# Patient Record
Sex: Male | Born: 1945
Health system: Southern US, Community
[De-identification: ages and names within clinical notes are randomized; demographics above are authoritative.]

## PROBLEM LIST (undated history)

## (undated) DIAGNOSIS — M545 Low back pain, unspecified: Secondary | ICD-10-CM

## (undated) DIAGNOSIS — E039 Hypothyroidism, unspecified: Secondary | ICD-10-CM

## (undated) DIAGNOSIS — K589 Irritable bowel syndrome without diarrhea: Secondary | ICD-10-CM

## (undated) DIAGNOSIS — K219 Gastro-esophageal reflux disease without esophagitis: Secondary | ICD-10-CM

## (undated) DIAGNOSIS — E119 Type 2 diabetes mellitus without complications: Secondary | ICD-10-CM

## (undated) DIAGNOSIS — R200 Anesthesia of skin: Secondary | ICD-10-CM

## (undated) DIAGNOSIS — M1990 Unspecified osteoarthritis, unspecified site: Secondary | ICD-10-CM

## (undated) DIAGNOSIS — I719 Aortic aneurysm of unspecified site, without rupture: Secondary | ICD-10-CM

## (undated) DIAGNOSIS — K449 Diaphragmatic hernia without obstruction or gangrene: Secondary | ICD-10-CM

## (undated) DIAGNOSIS — F419 Anxiety disorder, unspecified: Secondary | ICD-10-CM

## (undated) DIAGNOSIS — E7800 Pure hypercholesterolemia, unspecified: Secondary | ICD-10-CM

## (undated) DIAGNOSIS — G8929 Other chronic pain: Secondary | ICD-10-CM

## (undated) DIAGNOSIS — M353 Polymyalgia rheumatica: Secondary | ICD-10-CM

## (undated) DIAGNOSIS — I639 Cerebral infarction, unspecified: Secondary | ICD-10-CM

## (undated) DIAGNOSIS — I2510 Atherosclerotic heart disease of native coronary artery without angina pectoris: Secondary | ICD-10-CM

## (undated) DIAGNOSIS — R202 Paresthesia of skin: Secondary | ICD-10-CM

## (undated) HISTORY — DX: Polymyalgia rheumatica: M35.3

## (undated) HISTORY — DX: Hypothyroidism, unspecified: E03.9

## (undated) HISTORY — DX: Atherosclerotic heart disease of native coronary artery without angina pectoris: I25.10

## (undated) HISTORY — PX: INGUINAL HERNIA REPAIR: SUR1180

## (undated) HISTORY — DX: Type 2 diabetes mellitus without complications: E11.9

## (undated) HISTORY — DX: Aortic aneurysm of unspecified site, without rupture: I71.9

---

## 1982-05-30 HISTORY — PX: FINGER SURGERY: SHX640

## 2002-05-30 HISTORY — PX: FOREARM FRACTURE SURGERY: SHX649

## 2007-08-01 ENCOUNTER — Ambulatory Visit (HOSPITAL_COMMUNITY): Admission: RE | Admit: 2007-08-01 | Discharge: 2007-08-01 | Payer: Self-pay | Admitting: Family Medicine

## 2008-02-18 ENCOUNTER — Ambulatory Visit (HOSPITAL_COMMUNITY): Admission: RE | Admit: 2008-02-18 | Discharge: 2008-02-18 | Payer: Self-pay | Admitting: Internal Medicine

## 2009-07-13 ENCOUNTER — Ambulatory Visit: Payer: Self-pay | Admitting: Orthopedic Surgery

## 2009-07-13 DIAGNOSIS — M7580 Other shoulder lesions, unspecified shoulder: Secondary | ICD-10-CM

## 2009-07-22 ENCOUNTER — Telehealth: Payer: Self-pay | Admitting: Orthopedic Surgery

## 2009-07-23 ENCOUNTER — Ambulatory Visit (HOSPITAL_COMMUNITY): Admission: RE | Admit: 2009-07-23 | Discharge: 2009-07-23 | Payer: Self-pay | Admitting: Orthopedic Surgery

## 2009-08-03 ENCOUNTER — Ambulatory Visit: Payer: Self-pay | Admitting: Orthopedic Surgery

## 2009-08-03 DIAGNOSIS — M19019 Primary osteoarthritis, unspecified shoulder: Secondary | ICD-10-CM | POA: Insufficient documentation

## 2010-02-26 ENCOUNTER — Ambulatory Visit (HOSPITAL_COMMUNITY): Admission: RE | Admit: 2010-02-26 | Discharge: 2010-02-26 | Payer: Self-pay | Admitting: General Surgery

## 2010-06-29 NOTE — Progress Notes (Signed)
Summary: MRI appointment.  Phone Note Outgoing Call   Call placed by: Waldon Reining,  July 22, 2009 3:27 PM Call placed to: Patient Action Taken: Phone Call Completed, Appt scheduled Summary of Call: I called togive the patient his MRI appointment at [facility] on 07-23-09 at 2:00. Patient has [employer], authorization [phone] and it expires on 08-13-09. Patient will follow up here for his results.

## 2010-06-29 NOTE — Assessment & Plan Note (Signed)
Summary: LEFT SHOULDER PAIN NEEDS XR/BCBS/BSF   Vital Signs:  Patient profile:   65 year old male Weight:      210 pounds Pulse rate:   68 / minute Resp:     16 per minute  Vitals Entered By: Fuller Canada MD (July 13, 2009 10:34 AM)  Visit Type:  IME Initial Referring Provider:  self Primary Provider:  Dr. Sherwood Gambler  CC:  left shoulder pain.  History of Present Illness: I saw Caleb Butler in the office today for an initial visit.  He is a 65 years old man with the complaint NA:TFTD shoulder pain.  The patient complains of pain over the anterolateral portion of his LEFT shoulder associated with no injury.  It was gradual in onset it is relieved by nothing it is worsened by raising his arm, driving, scooping up a including his shirt on as well as moving his arm behind him.  His pain is severe an 8-9/10.  It is sharp burning pain.  He had an injection a year or so ago in the evening by Dr. Terrilee Croak he has not had any therapy.  He does take ibuprofen and Tylenol without relief.  He complains of some numbness in the LEFT hand.  He had x-rays of his neck x5 views which were essentially normal  Will have xrays today of the left shoulder.  Meds: SImvastatin and Ibuprofen.     Allergies (verified): No Known Drug Allergies  Past History:  Past Medical History: reflux hiatal hernia diverticulitis cholesterol  Past Surgical History: hernia lft side 1992 rt arm fracture 2004  Family History: Family History of Diabetes  Social History: Patient is married.  etired no smoking 5 alcoholic drinks per week a cup of coffee per day  Review of Systems General:  Complains of weight gain; denies weight loss, fever, chills, and fatigue. Cardiac :  Denies chest pain, angina, heart attack, heart failure, poor circulation, blood clots, and phlebitis. Resp:  Complains of short of breath; denies difficulty breathing, COPD, cough, and pneumonia; wheezing. GI:  Denies nausea, vomiting,  diarrhea, constipation, difficulty swallowing, ulcers, GERD, and reflux; heartburn. GU:  Denies kidney failure, kidney transplant, kidney stones, burning, poor stream, testicular cancer, blood in urine, and . Neuro:  Complains of dizziness; denies headache, migraines, numbness, weakness, tremor, and unsteady walking. MS:  Denies joint pain, rheumatoid arthritis, joint swelling, gout, bone cancer, osteoporosis, and . Endo:  Denies thyroid disease, goiter, and diabetes. Psych:  Denies depression, mood swings, anxiety, panic attack, bipolar, and schizophrenia. Derm:  Denies eczema, cancer, and itching; poor healing. EENT:  Denies poor vision, cataracts, glaucoma, poor hearing, vertigo, ears ringing, sinusitis, hoarseness, toothaches, and bleeding gums; floaters in rt eye. Immunology:  Denies seasonal allergies, sinus problems, and allergic to bee stings. Lymphatic:  Denies lymph node cancer and lymph edema.  Physical Exam  Additional Exam:  GEN: well developed, well nourished, normal grooming and hygiene, no deformity and normal body habitus.           vital signs stable as recorded   CDV: pulses are normal, no edema, no erythema. no tenderness  Lymph: normal lymph nodes   Skin: no rashes, skin lesions or open sores   NEURO: normal coordination, reflexes, sensation.   Psyche: awake, alert and oriented. Mood normal   Gait: normal  RIGHT shoulder inspection was normal, range of motion was full, motor exam grade 5, no joint laxity.  Negative impingement sign.  LEFT shoulder anterolateral tenderness along the deltoid with  full passive range of motion decreased active range of motion: Flexion equals 125, external rotation equals 45, internal rotation equals thoracic level XII.  Rotator cuff strength showed mild weakness in the supraspinatus but normal in the internal and external rotators.  Impingement sign was positive.  There is no joint laxity.     Impression &  Recommendations:  Problem # 1:  IMPINGEMENT SYNDROME (ICD-726.2) Assessment New  2 views of the glenohumeral joint   The glenohumeral joint space is normal. The bony anatomy is without bone lesion. The acromion is a Type II   Impression normal shoulder  I offered injection but the patient would rather go ahead with his MRI first and see what we see.  We expect to find a partially torn rotator cuff without retraction.  Probably will need surgery if that is the case.  Orders: New Patient Level III (40347) Shoulder x-ray,  minimum 2 views (42595)  Patient Instructions: 1)  MRI left shoulder 2)  come back for results

## 2010-06-29 NOTE — Letter (Signed)
Summary: History form  History form   Imported By: Jacklynn Ganong 07/20/2009 08:06:56  _____________________________________________________________________  External Attachment:    Type:   Image     Comment:   External Document

## 2010-06-29 NOTE — Assessment & Plan Note (Signed)
Summary: mri results from ap/frs   Vital Signs:  Patient profile:   65 year old male Height:      69 inches Weight:      207 pounds BMI:     30.68 Pulse rate:   68 / minute Resp:     20 per minute CC: Left shoulder rotator cuff Pain Assessment Patient in pain? yes     Location: shoulder Intensity: 3 Onset of pain  constant increase to 8 on movement.  Wake patient up at night   Referring Provider:  self Primary Provider:  Dr. Sherwood Gambler  CC:  Left shoulder rotator cuff.  History of Present Illness: This is a followup visit for Caleb Butler who had an MRI of his LEFT shoulder  His MRI shows tendinosis no tear subacromial bursitis with a.c. joint arthritis  I offered him an injection last time he preferred to get an MRI first.  He is still having tenderness over the a.c. joint and painful adduction.  Recommend injection again  LEFT shoulder a.c. joint injection Verbal consent obtained/The shoulder was injected with depomedrol 40mg /cc and sensorcaine .25% . There were no complications    The patient had immediately relief from the lidocaine  Recommend followup if he continues to have symptoms and discuss again removing the spur    Allergies: No Known Drug Allergies  Review of Systems Musculoskeletal:  Complains of joint pain and stiffness.   Impression & Recommendations:  Problem # 1:  OSTEOARTHRITIS, SHOULDER (ICD-715.91) Assessment Unchanged  left shoulder inj   ac joint   The MRI was done at [facility] and it was reviewed with the report  agree with the reported findings  Orders: Est. Patient Level III (57846) Joint Aspirate / Injection, Large (20610) Depo- Medrol 40mg  (J1030)  Problem # 2:  IMPINGEMENT SYNDROME (ICD-726.2)  Patient Instructions: 1)  You have received an injection of cortisone today. You may experience increased pain at the injection site. Apply ice pack to the area for 20 minutes every 2 hours and take 2 xtra strength tylenol every 8  hours. This increased pain will usually resolve in 24 hours. The injection will take effect in 3-10 days.  2)  If not better after 2 weeks consider surgical excision of spur

## 2010-08-12 LAB — BASIC METABOLIC PANEL
BUN: 16 mg/dL (ref 6–23)
CO2: 29 mEq/L (ref 19–32)
Calcium: 9.4 mg/dL (ref 8.4–10.5)
GFR calc non Af Amer: >60 mL/min (ref >60)
Glucose, Bld: 120 mg/dL — ABNORMAL HIGH (ref 70–99)

## 2010-08-12 LAB — CBC
MCHC: 33.9 g/dL (ref 30.0–36.0)
Platelets: 175 10*3/uL (ref 150–400)
RDW: 13.9 % (ref 11.5–15.5)

## 2010-08-12 LAB — SURGICAL PCR SCREEN: Staphylococcus aureus: NEGATIVE

## 2011-06-15 DIAGNOSIS — J069 Acute upper respiratory infection, unspecified: Secondary | ICD-10-CM | POA: Diagnosis not present

## 2011-06-15 DIAGNOSIS — E669 Obesity, unspecified: Secondary | ICD-10-CM | POA: Diagnosis not present

## 2011-06-15 DIAGNOSIS — Z6830 Body mass index (BMI) 30.0-30.9, adult: Secondary | ICD-10-CM | POA: Diagnosis not present

## 2011-09-06 DIAGNOSIS — E039 Hypothyroidism, unspecified: Secondary | ICD-10-CM | POA: Diagnosis not present

## 2011-09-06 DIAGNOSIS — R5383 Other fatigue: Secondary | ICD-10-CM | POA: Diagnosis not present

## 2011-09-06 DIAGNOSIS — Z6829 Body mass index (BMI) 29.0-29.9, adult: Secondary | ICD-10-CM | POA: Diagnosis not present

## 2011-09-06 DIAGNOSIS — E038 Other specified hypothyroidism: Secondary | ICD-10-CM | POA: Diagnosis not present

## 2011-09-06 DIAGNOSIS — G4700 Insomnia, unspecified: Secondary | ICD-10-CM | POA: Diagnosis not present

## 2011-09-06 DIAGNOSIS — R5381 Other malaise: Secondary | ICD-10-CM | POA: Diagnosis not present

## 2011-09-06 DIAGNOSIS — E785 Hyperlipidemia, unspecified: Secondary | ICD-10-CM | POA: Diagnosis not present

## 2011-09-06 DIAGNOSIS — R7309 Other abnormal glucose: Secondary | ICD-10-CM | POA: Diagnosis not present

## 2011-11-02 DIAGNOSIS — E290 Testicular hyperfunction: Secondary | ICD-10-CM | POA: Diagnosis not present

## 2012-02-09 DIAGNOSIS — E291 Testicular hypofunction: Secondary | ICD-10-CM | POA: Diagnosis not present

## 2012-02-09 DIAGNOSIS — Z23 Encounter for immunization: Secondary | ICD-10-CM | POA: Diagnosis not present

## 2012-02-21 DIAGNOSIS — Z0000 Encounter for general adult medical examination without abnormal findings: Secondary | ICD-10-CM | POA: Diagnosis not present

## 2012-02-27 ENCOUNTER — Other Ambulatory Visit (HOSPITAL_COMMUNITY): Payer: Self-pay | Admitting: Physician Assistant

## 2012-02-27 DIAGNOSIS — H538 Other visual disturbances: Secondary | ICD-10-CM

## 2012-02-27 DIAGNOSIS — Z6830 Body mass index (BMI) 30.0-30.9, adult: Secondary | ICD-10-CM | POA: Diagnosis not present

## 2012-02-27 DIAGNOSIS — R51 Headache: Secondary | ICD-10-CM | POA: Diagnosis not present

## 2012-02-27 DIAGNOSIS — B0229 Other postherpetic nervous system involvement: Secondary | ICD-10-CM | POA: Diagnosis not present

## 2012-02-28 ENCOUNTER — Encounter (HOSPITAL_COMMUNITY): Payer: Self-pay

## 2012-02-28 ENCOUNTER — Ambulatory Visit (HOSPITAL_COMMUNITY)
Admission: RE | Admit: 2012-02-28 | Discharge: 2012-02-28 | Disposition: A | Discharge status: Home / Self Care | Payer: Medicare Other | Source: Ambulatory Visit | Attending: Physician Assistant | Admitting: Physician Assistant

## 2012-02-28 DIAGNOSIS — R51 Headache: Secondary | ICD-10-CM

## 2012-02-28 DIAGNOSIS — H538 Other visual disturbances: Secondary | ICD-10-CM | POA: Insufficient documentation

## 2012-03-16 DIAGNOSIS — E291 Testicular hypofunction: Secondary | ICD-10-CM | POA: Diagnosis not present

## 2012-03-16 DIAGNOSIS — Z6829 Body mass index (BMI) 29.0-29.9, adult: Secondary | ICD-10-CM | POA: Diagnosis not present

## 2012-03-16 DIAGNOSIS — M25549 Pain in joints of unspecified hand: Secondary | ICD-10-CM | POA: Diagnosis not present

## 2012-03-16 DIAGNOSIS — Z125 Encounter for screening for malignant neoplasm of prostate: Secondary | ICD-10-CM | POA: Diagnosis not present

## 2012-03-16 DIAGNOSIS — E119 Type 2 diabetes mellitus without complications: Secondary | ICD-10-CM | POA: Diagnosis not present

## 2012-03-16 DIAGNOSIS — Z79899 Other long term (current) drug therapy: Secondary | ICD-10-CM | POA: Diagnosis not present

## 2012-03-27 DIAGNOSIS — H903 Sensorineural hearing loss, bilateral: Secondary | ICD-10-CM | POA: Diagnosis not present

## 2012-04-11 DIAGNOSIS — M47817 Spondylosis without myelopathy or radiculopathy, lumbosacral region: Secondary | ICD-10-CM | POA: Diagnosis not present

## 2012-04-11 DIAGNOSIS — M5430 Sciatica, unspecified side: Secondary | ICD-10-CM | POA: Diagnosis not present

## 2012-04-11 DIAGNOSIS — M999 Biomechanical lesion, unspecified: Secondary | ICD-10-CM | POA: Diagnosis not present

## 2012-04-13 DIAGNOSIS — M5430 Sciatica, unspecified side: Secondary | ICD-10-CM | POA: Diagnosis not present

## 2012-04-13 DIAGNOSIS — M47817 Spondylosis without myelopathy or radiculopathy, lumbosacral region: Secondary | ICD-10-CM | POA: Diagnosis not present

## 2012-04-13 DIAGNOSIS — M999 Biomechanical lesion, unspecified: Secondary | ICD-10-CM | POA: Diagnosis not present

## 2012-05-15 DIAGNOSIS — E291 Testicular hypofunction: Secondary | ICD-10-CM | POA: Diagnosis not present

## 2012-05-21 DIAGNOSIS — E039 Hypothyroidism, unspecified: Secondary | ICD-10-CM | POA: Diagnosis not present

## 2012-05-21 DIAGNOSIS — Z0289 Encounter for other administrative examinations: Secondary | ICD-10-CM | POA: Diagnosis not present

## 2012-05-21 DIAGNOSIS — E785 Hyperlipidemia, unspecified: Secondary | ICD-10-CM | POA: Diagnosis not present

## 2012-05-21 DIAGNOSIS — N390 Urinary tract infection, site not specified: Secondary | ICD-10-CM | POA: Diagnosis not present

## 2012-08-07 DIAGNOSIS — E291 Testicular hypofunction: Secondary | ICD-10-CM | POA: Diagnosis not present

## 2012-09-07 DIAGNOSIS — Z79899 Other long term (current) drug therapy: Secondary | ICD-10-CM | POA: Diagnosis not present

## 2013-01-01 DIAGNOSIS — E291 Testicular hypofunction: Secondary | ICD-10-CM | POA: Diagnosis not present

## 2013-03-19 DIAGNOSIS — E236 Other disorders of pituitary gland: Secondary | ICD-10-CM | POA: Diagnosis not present

## 2013-03-19 DIAGNOSIS — Z23 Encounter for immunization: Secondary | ICD-10-CM | POA: Diagnosis not present

## 2013-08-26 DIAGNOSIS — E781 Pure hyperglyceridemia: Secondary | ICD-10-CM | POA: Diagnosis not present

## 2013-08-26 DIAGNOSIS — E291 Testicular hypofunction: Secondary | ICD-10-CM | POA: Diagnosis not present

## 2013-08-26 DIAGNOSIS — R079 Chest pain, unspecified: Secondary | ICD-10-CM | POA: Diagnosis not present

## 2013-08-26 DIAGNOSIS — IMO0002 Reserved for concepts with insufficient information to code with codable children: Secondary | ICD-10-CM | POA: Diagnosis not present

## 2013-08-26 DIAGNOSIS — R0609 Other forms of dyspnea: Secondary | ICD-10-CM | POA: Diagnosis not present

## 2013-08-26 DIAGNOSIS — Z6830 Body mass index (BMI) 30.0-30.9, adult: Secondary | ICD-10-CM | POA: Diagnosis not present

## 2013-08-26 DIAGNOSIS — Z79899 Other long term (current) drug therapy: Secondary | ICD-10-CM | POA: Diagnosis not present

## 2013-08-26 DIAGNOSIS — I10 Essential (primary) hypertension: Secondary | ICD-10-CM | POA: Diagnosis not present

## 2013-08-26 DIAGNOSIS — Z125 Encounter for screening for malignant neoplasm of prostate: Secondary | ICD-10-CM | POA: Diagnosis not present

## 2013-09-02 ENCOUNTER — Other Ambulatory Visit (HOSPITAL_COMMUNITY): Payer: Self-pay | Admitting: Internal Medicine

## 2013-09-02 DIAGNOSIS — M545 Low back pain, unspecified: Secondary | ICD-10-CM

## 2013-09-05 ENCOUNTER — Ambulatory Visit (HOSPITAL_COMMUNITY)
Admission: RE | Admit: 2013-09-05 | Discharge: 2013-09-05 | Disposition: A | Discharge status: Home / Self Care | Payer: Medicare Other | Source: Ambulatory Visit | Attending: Internal Medicine | Admitting: Internal Medicine

## 2013-09-05 ENCOUNTER — Encounter (HOSPITAL_COMMUNITY): Payer: Self-pay

## 2013-09-05 DIAGNOSIS — M5126 Other intervertebral disc displacement, lumbar region: Secondary | ICD-10-CM | POA: Diagnosis not present

## 2013-09-05 DIAGNOSIS — M545 Low back pain, unspecified: Secondary | ICD-10-CM | POA: Diagnosis not present

## 2013-09-05 DIAGNOSIS — M51379 Other intervertebral disc degeneration, lumbosacral region without mention of lumbar back pain or lower extremity pain: Secondary | ICD-10-CM | POA: Insufficient documentation

## 2013-09-05 DIAGNOSIS — R209 Unspecified disturbances of skin sensation: Secondary | ICD-10-CM | POA: Insufficient documentation

## 2013-09-05 DIAGNOSIS — M5137 Other intervertebral disc degeneration, lumbosacral region: Secondary | ICD-10-CM | POA: Insufficient documentation

## 2013-09-05 DIAGNOSIS — M47817 Spondylosis without myelopathy or radiculopathy, lumbosacral region: Secondary | ICD-10-CM | POA: Insufficient documentation

## 2013-09-19 DIAGNOSIS — R209 Unspecified disturbances of skin sensation: Secondary | ICD-10-CM | POA: Diagnosis not present

## 2013-09-19 DIAGNOSIS — M549 Dorsalgia, unspecified: Secondary | ICD-10-CM | POA: Diagnosis not present

## 2013-09-19 DIAGNOSIS — Z6829 Body mass index (BMI) 29.0-29.9, adult: Secondary | ICD-10-CM | POA: Diagnosis not present

## 2013-10-03 DIAGNOSIS — R072 Precordial pain: Secondary | ICD-10-CM | POA: Diagnosis not present

## 2013-10-03 DIAGNOSIS — R079 Chest pain, unspecified: Secondary | ICD-10-CM | POA: Diagnosis not present

## 2013-10-03 DIAGNOSIS — R0602 Shortness of breath: Secondary | ICD-10-CM | POA: Diagnosis not present

## 2013-10-17 DIAGNOSIS — I359 Nonrheumatic aortic valve disorder, unspecified: Secondary | ICD-10-CM | POA: Diagnosis not present

## 2013-10-17 DIAGNOSIS — R079 Chest pain, unspecified: Secondary | ICD-10-CM | POA: Diagnosis not present

## 2013-11-16 ENCOUNTER — Encounter (HOSPITAL_COMMUNITY): Payer: Self-pay | Admitting: Emergency Medicine

## 2013-11-16 ENCOUNTER — Emergency Department (HOSPITAL_COMMUNITY)
Admission: EM | Admit: 2013-11-16 | Discharge: 2013-11-16 | Disposition: A | Discharge status: Home / Self Care | Payer: Medicare Other | Attending: Emergency Medicine | Admitting: Emergency Medicine

## 2013-11-16 ENCOUNTER — Emergency Department (HOSPITAL_COMMUNITY): Payer: Medicare Other

## 2013-11-16 DIAGNOSIS — S93409A Sprain of unspecified ligament of unspecified ankle, initial encounter: Secondary | ICD-10-CM | POA: Diagnosis not present

## 2013-11-16 DIAGNOSIS — E119 Type 2 diabetes mellitus without complications: Secondary | ICD-10-CM | POA: Insufficient documentation

## 2013-11-16 DIAGNOSIS — Y9289 Other specified places as the place of occurrence of the external cause: Secondary | ICD-10-CM | POA: Insufficient documentation

## 2013-11-16 DIAGNOSIS — Y9301 Activity, walking, marching and hiking: Secondary | ICD-10-CM | POA: Insufficient documentation

## 2013-11-16 DIAGNOSIS — Z8719 Personal history of other diseases of the digestive system: Secondary | ICD-10-CM | POA: Diagnosis not present

## 2013-11-16 DIAGNOSIS — S93401A Sprain of unspecified ligament of right ankle, initial encounter: Secondary | ICD-10-CM

## 2013-11-16 DIAGNOSIS — X500XXA Overexertion from strenuous movement or load, initial encounter: Secondary | ICD-10-CM | POA: Insufficient documentation

## 2013-11-16 HISTORY — DX: Diaphragmatic hernia without obstruction or gangrene: K44.9

## 2013-11-16 NOTE — ED Provider Notes (Signed)
CSN: [id number]     Arrival date & time 11/16/13  0750 History   None    Chief Complaint  Patient presents with  . Ankle Pain   The history is provided by the patient. No language interpreter was used.   This chart was scribed for Caleb Christen, MD by Thea Alken, ED Scribe. This patient was seen in room APA03/APA03 and the patient's care was started at 8:36 AM.  HPI Comments:  Caleb Butler is a 68 y.o. male who present to the Emergency Department complaining of right ankle pain x last night. States he heard a "pop" to his right ankle when he stepped in [place]. Pt has tried icing ankle without relief to pain. He also took 3 ibuprofen last night for the pain. Pt reports similar injury in the past to left ankle.  Past Medical History  Diagnosis Date  . Diabetes mellitus   . Thyroid disease   . Hiatal hernia    Past Surgical History  Procedure Laterality Date  . Right arm      History reviewed. No pertinent family history. History  Substance Use Topics  . Smoking status: Never Smoker   . Smokeless tobacco: Not on file  . Alcohol Use: Yes     Comment: occ    Review of Systems  Musculoskeletal: Positive for arthralgias and myalgias.   Allergies  Bee venom and Shellfish allergy  Home Medications   Prior to Admission medications   Not on File   Triage Vitals- BP 107/73  Pulse 72  Temp(Src) 97.9 F (36.6 C) (Oral)  Resp 19  SpO2 98% Physical Exam  Nursing note and vitals reviewed. Constitutional: He is oriented to person, place, and time. He appears well-developed and well-nourished.  HENT:  Head: Normocephalic and atraumatic.  Eyes: Conjunctivae and EOM are normal. Pupils are equal, round, and reactive to light.  Neck: Normal range of motion. Neck supple.  Cardiovascular: Normal rate, regular rhythm and normal heart sounds.   Pulmonary/Chest: Effort normal and breath sounds normal.  Abdominal: Soft. Bowel sounds are  normal.  Musculoskeletal: Normal range of motion.  Tender and puffy to right lateral ankle Pain with ROM  Neurological: He is alert and oriented to person, place, and time.  Skin: Skin is warm and dry.  Psychiatric: He has a normal mood and affect. His behavior is normal.    ED Course  Procedures COORDINATION OF CARE: 8:43 AM-Discussed treatment plan which includes ankle brace with pt at bedside and pt agreed to plan. Pt has also been advised to rest, elevate, and ice ankle as well as take ibuprofen as need for the the pain   Labs Review Labs Reviewed - No data to display  Imaging Review Dg Ankle Complete Right  11/16/2013   CLINICAL DATA:  Stepped in hole, twisted ankle  EXAM: RIGHT ANKLE - COMPLETE 3+ VIEW  COMPARISON:  None  FINDINGS: Extensive soft tissue swelling about the lateral aspect of the ankle. No acute fracture or malalignment identified. There is a moderate ankle joint effusion. Bony mineralization is within normal limits. No lytic or blastic osseous lesion. The ankle mortise is congruent in the tailor dome intact.  IMPRESSION: Soft tissue swelling and ankle joint effusion without evidence of acute fracture or malalignment.   Electronically Signed   By: Jacqulynn Cadet M.D.   On: 11/16/2013 08:34     EKG Interpretation None      MDM  Final diagnoses:  Right ankle sprain, initial encounter   X-ray of right ankle reveals no fracture.   Rest, ice, elevate, compress.   No other injuries. I personally performed the services described in this documentation, which was scribed in my presence. The recorded information has been reviewed and is accurate.      Caleb Christen, MD 11/16/13 [phone]

## 2013-11-16 NOTE — Discharge Instructions (Signed)
Ankle Sprain An ankle sprain is an injury to the strong, fibrous tissues (ligaments) that hold your ankle bones together.  HOME CARE   Put ice on your ankle for 1-2 days or as told by your doctor.  Put ice in a plastic bag.  Place a towel between your skin and the bag.  Leave the ice on for 15-20 minutes at a time, every 2 hours while you are awake.  Only take medicine as told by your doctor.  Raise (elevate) your injured ankle above the level of your heart as much as possible for 2-3 days.  Use crutches if your doctor tells you to. Slowly put your own weight on the affected ankle. Use the crutches until you can walk without pain.  If you have a plaster splint:  Do not rest it on anything harder than a pillow for 24 hours.  Do not put weight on it.  Do not get it wet.  Take it off to shower or bathe.  If given, use an elastic wrap or support stocking for support. Take the wrap off if your toes lose feeling (numb), tingle, or turn cold or blue.  If you have an air splint:  Add or let out air to make it comfortable.  Take it off at night and to shower and bathe.  Wiggle your toes and move your ankle up and down often while you are wearing it. GET HELP RIGHT AWAY IF:   Your toes lose feeling (numb) or turn blue.  You have severe pain that is increasing.  You have rapidly increasing bruising or puffiness (swelling).  Your toes feel very cold.  You lose feeling in your foot.  Your medicine does not help your pain. MAKE SURE YOU:   Understand these instructions.  Will watch your condition.  Will get help right away if you are not doing well or get worse. Document Released: 11/02/2007 Document Revised: 02/08/2012 Document Reviewed: 11/28/2011 [facility] Patient Information 2015 [place], [place]. This information is not intended to replace advice given to you by your health care provider. Make sure you discuss any questions you have with your health care  provider.  X-ray shows no fracture. Rest, ice, elevate, compression brace.   Ibuprofen 3-4 tablets 3 times a day.

## 2013-11-16 NOTE — ED Notes (Signed)
Pt stepped in hole last night and c/o pain to right ankle. Swelling around right ankle observed. Pedal pulses present.

## 2013-11-18 DIAGNOSIS — S9000XA Contusion of unspecified ankle, initial encounter: Secondary | ICD-10-CM | POA: Diagnosis not present

## 2013-11-18 DIAGNOSIS — M25579 Pain in unspecified ankle and joints of unspecified foot: Secondary | ICD-10-CM | POA: Diagnosis not present

## 2013-11-18 DIAGNOSIS — S93409A Sprain of unspecified ligament of unspecified ankle, initial encounter: Secondary | ICD-10-CM | POA: Diagnosis not present

## 2013-12-09 DIAGNOSIS — Z5189 Encounter for other specified aftercare: Secondary | ICD-10-CM | POA: Diagnosis not present

## 2013-12-11 DIAGNOSIS — M79609 Pain in unspecified limb: Secondary | ICD-10-CM | POA: Diagnosis not present

## 2013-12-11 DIAGNOSIS — S93409A Sprain of unspecified ligament of unspecified ankle, initial encounter: Secondary | ICD-10-CM | POA: Diagnosis not present

## 2013-12-11 DIAGNOSIS — R262 Difficulty in walking, not elsewhere classified: Secondary | ICD-10-CM | POA: Diagnosis not present

## 2013-12-12 DIAGNOSIS — R262 Difficulty in walking, not elsewhere classified: Secondary | ICD-10-CM | POA: Diagnosis not present

## 2013-12-12 DIAGNOSIS — M79609 Pain in unspecified limb: Secondary | ICD-10-CM | POA: Diagnosis not present

## 2013-12-12 DIAGNOSIS — S93409A Sprain of unspecified ligament of unspecified ankle, initial encounter: Secondary | ICD-10-CM | POA: Diagnosis not present

## 2013-12-16 DIAGNOSIS — R262 Difficulty in walking, not elsewhere classified: Secondary | ICD-10-CM | POA: Diagnosis not present

## 2013-12-16 DIAGNOSIS — M79609 Pain in unspecified limb: Secondary | ICD-10-CM | POA: Diagnosis not present

## 2013-12-16 DIAGNOSIS — S93409A Sprain of unspecified ligament of unspecified ankle, initial encounter: Secondary | ICD-10-CM | POA: Diagnosis not present

## 2013-12-18 DIAGNOSIS — R262 Difficulty in walking, not elsewhere classified: Secondary | ICD-10-CM | POA: Diagnosis not present

## 2013-12-18 DIAGNOSIS — S93409A Sprain of unspecified ligament of unspecified ankle, initial encounter: Secondary | ICD-10-CM | POA: Diagnosis not present

## 2013-12-18 DIAGNOSIS — M79609 Pain in unspecified limb: Secondary | ICD-10-CM | POA: Diagnosis not present

## 2013-12-20 DIAGNOSIS — M79609 Pain in unspecified limb: Secondary | ICD-10-CM | POA: Diagnosis not present

## 2013-12-20 DIAGNOSIS — S93409A Sprain of unspecified ligament of unspecified ankle, initial encounter: Secondary | ICD-10-CM | POA: Diagnosis not present

## 2013-12-20 DIAGNOSIS — R262 Difficulty in walking, not elsewhere classified: Secondary | ICD-10-CM | POA: Diagnosis not present

## 2013-12-23 DIAGNOSIS — S93409A Sprain of unspecified ligament of unspecified ankle, initial encounter: Secondary | ICD-10-CM | POA: Diagnosis not present

## 2013-12-23 DIAGNOSIS — M79609 Pain in unspecified limb: Secondary | ICD-10-CM | POA: Diagnosis not present

## 2013-12-23 DIAGNOSIS — R262 Difficulty in walking, not elsewhere classified: Secondary | ICD-10-CM | POA: Diagnosis not present

## 2013-12-25 DIAGNOSIS — R262 Difficulty in walking, not elsewhere classified: Secondary | ICD-10-CM | POA: Diagnosis not present

## 2013-12-25 DIAGNOSIS — S93409A Sprain of unspecified ligament of unspecified ankle, initial encounter: Secondary | ICD-10-CM | POA: Diagnosis not present

## 2013-12-25 DIAGNOSIS — M79609 Pain in unspecified limb: Secondary | ICD-10-CM | POA: Diagnosis not present

## 2013-12-30 DIAGNOSIS — M79609 Pain in unspecified limb: Secondary | ICD-10-CM | POA: Diagnosis not present

## 2013-12-30 DIAGNOSIS — S93409A Sprain of unspecified ligament of unspecified ankle, initial encounter: Secondary | ICD-10-CM | POA: Diagnosis not present

## 2013-12-30 DIAGNOSIS — R262 Difficulty in walking, not elsewhere classified: Secondary | ICD-10-CM | POA: Diagnosis not present

## 2014-01-06 DIAGNOSIS — M25473 Effusion, unspecified ankle: Secondary | ICD-10-CM | POA: Diagnosis not present

## 2014-01-06 DIAGNOSIS — M25579 Pain in unspecified ankle and joints of unspecified foot: Secondary | ICD-10-CM | POA: Diagnosis not present

## 2014-01-06 DIAGNOSIS — Z5189 Encounter for other specified aftercare: Secondary | ICD-10-CM | POA: Diagnosis not present

## 2014-01-06 DIAGNOSIS — M25476 Effusion, unspecified foot: Secondary | ICD-10-CM | POA: Diagnosis not present

## 2014-04-02 DIAGNOSIS — Z0000 Encounter for general adult medical examination without abnormal findings: Secondary | ICD-10-CM | POA: Diagnosis not present

## 2014-04-02 DIAGNOSIS — E119 Type 2 diabetes mellitus without complications: Secondary | ICD-10-CM | POA: Diagnosis not present

## 2014-04-02 DIAGNOSIS — E782 Mixed hyperlipidemia: Secondary | ICD-10-CM | POA: Diagnosis not present

## 2014-04-02 DIAGNOSIS — Z23 Encounter for immunization: Secondary | ICD-10-CM | POA: Diagnosis not present

## 2014-04-02 DIAGNOSIS — I10 Essential (primary) hypertension: Secondary | ICD-10-CM | POA: Diagnosis not present

## 2014-04-02 DIAGNOSIS — Z6829 Body mass index (BMI) 29.0-29.9, adult: Secondary | ICD-10-CM | POA: Diagnosis not present

## 2014-04-02 DIAGNOSIS — E663 Overweight: Secondary | ICD-10-CM | POA: Diagnosis not present

## 2014-04-02 DIAGNOSIS — E291 Testicular hypofunction: Secondary | ICD-10-CM | POA: Diagnosis not present

## 2014-04-07 DIAGNOSIS — E119 Type 2 diabetes mellitus without complications: Secondary | ICD-10-CM | POA: Diagnosis not present

## 2014-08-26 DIAGNOSIS — M9902 Segmental and somatic dysfunction of thoracic region: Secondary | ICD-10-CM | POA: Diagnosis not present

## 2014-08-26 DIAGNOSIS — M9903 Segmental and somatic dysfunction of lumbar region: Secondary | ICD-10-CM | POA: Diagnosis not present

## 2014-08-26 DIAGNOSIS — M47816 Spondylosis without myelopathy or radiculopathy, lumbar region: Secondary | ICD-10-CM | POA: Diagnosis not present

## 2014-08-26 DIAGNOSIS — M9901 Segmental and somatic dysfunction of cervical region: Secondary | ICD-10-CM | POA: Diagnosis not present

## 2014-08-26 DIAGNOSIS — M546 Pain in thoracic spine: Secondary | ICD-10-CM | POA: Diagnosis not present

## 2014-08-26 DIAGNOSIS — M47812 Spondylosis without myelopathy or radiculopathy, cervical region: Secondary | ICD-10-CM | POA: Diagnosis not present

## 2014-10-14 DIAGNOSIS — E119 Type 2 diabetes mellitus without complications: Secondary | ICD-10-CM | POA: Diagnosis not present

## 2014-10-14 DIAGNOSIS — R0602 Shortness of breath: Secondary | ICD-10-CM | POA: Diagnosis not present

## 2014-10-14 DIAGNOSIS — R072 Precordial pain: Secondary | ICD-10-CM | POA: Diagnosis not present

## 2014-12-15 DIAGNOSIS — E291 Testicular hypofunction: Secondary | ICD-10-CM | POA: Diagnosis not present

## 2015-01-14 DIAGNOSIS — M546 Pain in thoracic spine: Secondary | ICD-10-CM | POA: Diagnosis not present

## 2015-01-14 DIAGNOSIS — M47812 Spondylosis without myelopathy or radiculopathy, cervical region: Secondary | ICD-10-CM | POA: Diagnosis not present

## 2015-01-14 DIAGNOSIS — M9902 Segmental and somatic dysfunction of thoracic region: Secondary | ICD-10-CM | POA: Diagnosis not present

## 2015-01-14 DIAGNOSIS — M9901 Segmental and somatic dysfunction of cervical region: Secondary | ICD-10-CM | POA: Diagnosis not present

## 2015-01-14 DIAGNOSIS — M9903 Segmental and somatic dysfunction of lumbar region: Secondary | ICD-10-CM | POA: Diagnosis not present

## 2015-01-14 DIAGNOSIS — M47816 Spondylosis without myelopathy or radiculopathy, lumbar region: Secondary | ICD-10-CM | POA: Diagnosis not present

## 2015-02-06 DIAGNOSIS — Z23 Encounter for immunization: Secondary | ICD-10-CM | POA: Diagnosis not present

## 2015-03-04 DIAGNOSIS — R1012 Left upper quadrant pain: Secondary | ICD-10-CM | POA: Diagnosis not present

## 2015-03-04 DIAGNOSIS — R1032 Left lower quadrant pain: Secondary | ICD-10-CM | POA: Diagnosis not present

## 2015-03-04 DIAGNOSIS — R5383 Other fatigue: Secondary | ICD-10-CM | POA: Diagnosis not present

## 2015-04-29 DIAGNOSIS — M9902 Segmental and somatic dysfunction of thoracic region: Secondary | ICD-10-CM | POA: Diagnosis not present

## 2015-04-29 DIAGNOSIS — M546 Pain in thoracic spine: Secondary | ICD-10-CM | POA: Diagnosis not present

## 2015-04-29 DIAGNOSIS — M9903 Segmental and somatic dysfunction of lumbar region: Secondary | ICD-10-CM | POA: Diagnosis not present

## 2015-04-29 DIAGNOSIS — M9901 Segmental and somatic dysfunction of cervical region: Secondary | ICD-10-CM | POA: Diagnosis not present

## 2015-04-29 DIAGNOSIS — M47816 Spondylosis without myelopathy or radiculopathy, lumbar region: Secondary | ICD-10-CM | POA: Diagnosis not present

## 2015-04-29 DIAGNOSIS — S338XXA Sprain of other parts of lumbar spine and pelvis, initial encounter: Secondary | ICD-10-CM | POA: Diagnosis not present

## 2015-04-29 DIAGNOSIS — M47812 Spondylosis without myelopathy or radiculopathy, cervical region: Secondary | ICD-10-CM | POA: Diagnosis not present

## 2015-05-01 DIAGNOSIS — M47816 Spondylosis without myelopathy or radiculopathy, lumbar region: Secondary | ICD-10-CM | POA: Diagnosis not present

## 2015-05-01 DIAGNOSIS — M9901 Segmental and somatic dysfunction of cervical region: Secondary | ICD-10-CM | POA: Diagnosis not present

## 2015-05-01 DIAGNOSIS — M546 Pain in thoracic spine: Secondary | ICD-10-CM | POA: Diagnosis not present

## 2015-05-01 DIAGNOSIS — S338XXA Sprain of other parts of lumbar spine and pelvis, initial encounter: Secondary | ICD-10-CM | POA: Diagnosis not present

## 2015-05-01 DIAGNOSIS — M9903 Segmental and somatic dysfunction of lumbar region: Secondary | ICD-10-CM | POA: Diagnosis not present

## 2015-05-01 DIAGNOSIS — M47812 Spondylosis without myelopathy or radiculopathy, cervical region: Secondary | ICD-10-CM | POA: Diagnosis not present

## 2015-05-01 DIAGNOSIS — M9902 Segmental and somatic dysfunction of thoracic region: Secondary | ICD-10-CM | POA: Diagnosis not present

## 2015-05-05 DIAGNOSIS — M9903 Segmental and somatic dysfunction of lumbar region: Secondary | ICD-10-CM | POA: Diagnosis not present

## 2015-05-05 DIAGNOSIS — M47812 Spondylosis without myelopathy or radiculopathy, cervical region: Secondary | ICD-10-CM | POA: Diagnosis not present

## 2015-05-05 DIAGNOSIS — M9902 Segmental and somatic dysfunction of thoracic region: Secondary | ICD-10-CM | POA: Diagnosis not present

## 2015-05-05 DIAGNOSIS — M9901 Segmental and somatic dysfunction of cervical region: Secondary | ICD-10-CM | POA: Diagnosis not present

## 2015-05-05 DIAGNOSIS — M47816 Spondylosis without myelopathy or radiculopathy, lumbar region: Secondary | ICD-10-CM | POA: Diagnosis not present

## 2015-05-05 DIAGNOSIS — M546 Pain in thoracic spine: Secondary | ICD-10-CM | POA: Diagnosis not present

## 2015-05-05 DIAGNOSIS — S338XXA Sprain of other parts of lumbar spine and pelvis, initial encounter: Secondary | ICD-10-CM | POA: Diagnosis not present

## 2015-05-08 DIAGNOSIS — M546 Pain in thoracic spine: Secondary | ICD-10-CM | POA: Diagnosis not present

## 2015-05-08 DIAGNOSIS — M9901 Segmental and somatic dysfunction of cervical region: Secondary | ICD-10-CM | POA: Diagnosis not present

## 2015-05-08 DIAGNOSIS — S338XXA Sprain of other parts of lumbar spine and pelvis, initial encounter: Secondary | ICD-10-CM | POA: Diagnosis not present

## 2015-05-08 DIAGNOSIS — M9903 Segmental and somatic dysfunction of lumbar region: Secondary | ICD-10-CM | POA: Diagnosis not present

## 2015-05-08 DIAGNOSIS — M9902 Segmental and somatic dysfunction of thoracic region: Secondary | ICD-10-CM | POA: Diagnosis not present

## 2015-05-08 DIAGNOSIS — M47816 Spondylosis without myelopathy or radiculopathy, lumbar region: Secondary | ICD-10-CM | POA: Diagnosis not present

## 2015-05-08 DIAGNOSIS — M47812 Spondylosis without myelopathy or radiculopathy, cervical region: Secondary | ICD-10-CM | POA: Diagnosis not present

## 2015-05-18 DIAGNOSIS — M546 Pain in thoracic spine: Secondary | ICD-10-CM | POA: Diagnosis not present

## 2015-05-18 DIAGNOSIS — M9901 Segmental and somatic dysfunction of cervical region: Secondary | ICD-10-CM | POA: Diagnosis not present

## 2015-05-18 DIAGNOSIS — M47812 Spondylosis without myelopathy or radiculopathy, cervical region: Secondary | ICD-10-CM | POA: Diagnosis not present

## 2015-05-18 DIAGNOSIS — M47816 Spondylosis without myelopathy or radiculopathy, lumbar region: Secondary | ICD-10-CM | POA: Diagnosis not present

## 2015-05-18 DIAGNOSIS — M9903 Segmental and somatic dysfunction of lumbar region: Secondary | ICD-10-CM | POA: Diagnosis not present

## 2015-05-18 DIAGNOSIS — M9902 Segmental and somatic dysfunction of thoracic region: Secondary | ICD-10-CM | POA: Diagnosis not present

## 2015-05-18 DIAGNOSIS — S338XXA Sprain of other parts of lumbar spine and pelvis, initial encounter: Secondary | ICD-10-CM | POA: Diagnosis not present

## 2015-05-26 DIAGNOSIS — M9902 Segmental and somatic dysfunction of thoracic region: Secondary | ICD-10-CM | POA: Diagnosis not present

## 2015-05-26 DIAGNOSIS — S338XXA Sprain of other parts of lumbar spine and pelvis, initial encounter: Secondary | ICD-10-CM | POA: Diagnosis not present

## 2015-05-26 DIAGNOSIS — M47812 Spondylosis without myelopathy or radiculopathy, cervical region: Secondary | ICD-10-CM | POA: Diagnosis not present

## 2015-05-26 DIAGNOSIS — M47816 Spondylosis without myelopathy or radiculopathy, lumbar region: Secondary | ICD-10-CM | POA: Diagnosis not present

## 2015-05-26 DIAGNOSIS — M546 Pain in thoracic spine: Secondary | ICD-10-CM | POA: Diagnosis not present

## 2015-05-26 DIAGNOSIS — M9901 Segmental and somatic dysfunction of cervical region: Secondary | ICD-10-CM | POA: Diagnosis not present

## 2015-05-26 DIAGNOSIS — M9903 Segmental and somatic dysfunction of lumbar region: Secondary | ICD-10-CM | POA: Diagnosis not present

## 2015-08-17 DIAGNOSIS — Z0001 Encounter for general adult medical examination with abnormal findings: Secondary | ICD-10-CM | POA: Diagnosis not present

## 2015-08-17 DIAGNOSIS — Z1389 Encounter for screening for other disorder: Secondary | ICD-10-CM | POA: Diagnosis not present

## 2015-08-17 DIAGNOSIS — E119 Type 2 diabetes mellitus without complications: Secondary | ICD-10-CM | POA: Diagnosis not present

## 2015-08-17 DIAGNOSIS — J019 Acute sinusitis, unspecified: Secondary | ICD-10-CM | POA: Diagnosis not present

## 2015-08-17 DIAGNOSIS — E663 Overweight: Secondary | ICD-10-CM | POA: Diagnosis not present

## 2015-08-17 DIAGNOSIS — E1165 Type 2 diabetes mellitus with hyperglycemia: Secondary | ICD-10-CM | POA: Diagnosis not present

## 2015-08-17 DIAGNOSIS — E063 Autoimmune thyroiditis: Secondary | ICD-10-CM | POA: Diagnosis not present

## 2015-08-17 DIAGNOSIS — Z6829 Body mass index (BMI) 29.0-29.9, adult: Secondary | ICD-10-CM | POA: Diagnosis not present

## 2015-10-14 DIAGNOSIS — R0602 Shortness of breath: Secondary | ICD-10-CM | POA: Diagnosis not present

## 2015-11-18 DIAGNOSIS — M546 Pain in thoracic spine: Secondary | ICD-10-CM | POA: Diagnosis not present

## 2015-11-18 DIAGNOSIS — M9902 Segmental and somatic dysfunction of thoracic region: Secondary | ICD-10-CM | POA: Diagnosis not present

## 2015-11-18 DIAGNOSIS — S338XXA Sprain of other parts of lumbar spine and pelvis, initial encounter: Secondary | ICD-10-CM | POA: Diagnosis not present

## 2015-11-18 DIAGNOSIS — M47816 Spondylosis without myelopathy or radiculopathy, lumbar region: Secondary | ICD-10-CM | POA: Diagnosis not present

## 2015-11-18 DIAGNOSIS — M47812 Spondylosis without myelopathy or radiculopathy, cervical region: Secondary | ICD-10-CM | POA: Diagnosis not present

## 2015-11-18 DIAGNOSIS — M9901 Segmental and somatic dysfunction of cervical region: Secondary | ICD-10-CM | POA: Diagnosis not present

## 2015-11-18 DIAGNOSIS — M9903 Segmental and somatic dysfunction of lumbar region: Secondary | ICD-10-CM | POA: Diagnosis not present

## 2015-11-19 DIAGNOSIS — M9903 Segmental and somatic dysfunction of lumbar region: Secondary | ICD-10-CM | POA: Diagnosis not present

## 2015-11-19 DIAGNOSIS — S338XXA Sprain of other parts of lumbar spine and pelvis, initial encounter: Secondary | ICD-10-CM | POA: Diagnosis not present

## 2015-11-19 DIAGNOSIS — M9901 Segmental and somatic dysfunction of cervical region: Secondary | ICD-10-CM | POA: Diagnosis not present

## 2015-11-19 DIAGNOSIS — M546 Pain in thoracic spine: Secondary | ICD-10-CM | POA: Diagnosis not present

## 2015-11-19 DIAGNOSIS — M47812 Spondylosis without myelopathy or radiculopathy, cervical region: Secondary | ICD-10-CM | POA: Diagnosis not present

## 2015-11-19 DIAGNOSIS — M9902 Segmental and somatic dysfunction of thoracic region: Secondary | ICD-10-CM | POA: Diagnosis not present

## 2015-11-19 DIAGNOSIS — M47816 Spondylosis without myelopathy or radiculopathy, lumbar region: Secondary | ICD-10-CM | POA: Diagnosis not present

## 2015-11-23 DIAGNOSIS — M9901 Segmental and somatic dysfunction of cervical region: Secondary | ICD-10-CM | POA: Diagnosis not present

## 2015-11-23 DIAGNOSIS — M47812 Spondylosis without myelopathy or radiculopathy, cervical region: Secondary | ICD-10-CM | POA: Diagnosis not present

## 2015-11-23 DIAGNOSIS — S338XXA Sprain of other parts of lumbar spine and pelvis, initial encounter: Secondary | ICD-10-CM | POA: Diagnosis not present

## 2015-11-23 DIAGNOSIS — M47816 Spondylosis without myelopathy or radiculopathy, lumbar region: Secondary | ICD-10-CM | POA: Diagnosis not present

## 2015-11-23 DIAGNOSIS — M9903 Segmental and somatic dysfunction of lumbar region: Secondary | ICD-10-CM | POA: Diagnosis not present

## 2015-11-23 DIAGNOSIS — M9902 Segmental and somatic dysfunction of thoracic region: Secondary | ICD-10-CM | POA: Diagnosis not present

## 2015-11-23 DIAGNOSIS — M546 Pain in thoracic spine: Secondary | ICD-10-CM | POA: Diagnosis not present

## 2015-11-24 DIAGNOSIS — M47812 Spondylosis without myelopathy or radiculopathy, cervical region: Secondary | ICD-10-CM | POA: Diagnosis not present

## 2015-11-24 DIAGNOSIS — M47816 Spondylosis without myelopathy or radiculopathy, lumbar region: Secondary | ICD-10-CM | POA: Diagnosis not present

## 2015-11-24 DIAGNOSIS — S338XXA Sprain of other parts of lumbar spine and pelvis, initial encounter: Secondary | ICD-10-CM | POA: Diagnosis not present

## 2015-11-24 DIAGNOSIS — M9901 Segmental and somatic dysfunction of cervical region: Secondary | ICD-10-CM | POA: Diagnosis not present

## 2015-11-24 DIAGNOSIS — M9902 Segmental and somatic dysfunction of thoracic region: Secondary | ICD-10-CM | POA: Diagnosis not present

## 2015-11-24 DIAGNOSIS — M546 Pain in thoracic spine: Secondary | ICD-10-CM | POA: Diagnosis not present

## 2015-11-24 DIAGNOSIS — M9903 Segmental and somatic dysfunction of lumbar region: Secondary | ICD-10-CM | POA: Diagnosis not present

## 2015-11-25 DIAGNOSIS — M9902 Segmental and somatic dysfunction of thoracic region: Secondary | ICD-10-CM | POA: Diagnosis not present

## 2015-11-25 DIAGNOSIS — M9901 Segmental and somatic dysfunction of cervical region: Secondary | ICD-10-CM | POA: Diagnosis not present

## 2015-11-25 DIAGNOSIS — M47812 Spondylosis without myelopathy or radiculopathy, cervical region: Secondary | ICD-10-CM | POA: Diagnosis not present

## 2015-11-25 DIAGNOSIS — M9903 Segmental and somatic dysfunction of lumbar region: Secondary | ICD-10-CM | POA: Diagnosis not present

## 2015-11-25 DIAGNOSIS — M546 Pain in thoracic spine: Secondary | ICD-10-CM | POA: Diagnosis not present

## 2015-11-25 DIAGNOSIS — S338XXA Sprain of other parts of lumbar spine and pelvis, initial encounter: Secondary | ICD-10-CM | POA: Diagnosis not present

## 2015-11-25 DIAGNOSIS — M47816 Spondylosis without myelopathy or radiculopathy, lumbar region: Secondary | ICD-10-CM | POA: Diagnosis not present

## 2015-11-26 DIAGNOSIS — M5441 Lumbago with sciatica, right side: Secondary | ICD-10-CM | POA: Diagnosis not present

## 2015-11-30 ENCOUNTER — Encounter: Payer: Self-pay | Admitting: Family Medicine

## 2015-11-30 ENCOUNTER — Ambulatory Visit (INDEPENDENT_AMBULATORY_CARE_PROVIDER_SITE_OTHER): Payer: Medicare Other | Admitting: Family Medicine

## 2015-11-30 VITALS — BP 100/60 | HR 76 | Temp 97.4°F | Resp 16 | Ht 69.5 in | Wt 196.0 lb

## 2015-11-30 DIAGNOSIS — Z1159 Encounter for screening for other viral diseases: Secondary | ICD-10-CM | POA: Diagnosis not present

## 2015-11-30 DIAGNOSIS — E038 Other specified hypothyroidism: Secondary | ICD-10-CM | POA: Diagnosis not present

## 2015-11-30 DIAGNOSIS — E119 Type 2 diabetes mellitus without complications: Secondary | ICD-10-CM | POA: Diagnosis not present

## 2015-11-30 DIAGNOSIS — Z125 Encounter for screening for malignant neoplasm of prostate: Secondary | ICD-10-CM | POA: Diagnosis not present

## 2015-11-30 DIAGNOSIS — Z23 Encounter for immunization: Secondary | ICD-10-CM | POA: Diagnosis not present

## 2015-11-30 LAB — LIPID PANEL
CHOLESTEROL: 140 mg/dL (ref 125–200)
HDL: 41 mg/dL (ref >=40)
LDL CALC: 57 mg/dL (ref <130)
TRIGLYCERIDES: 210 mg/dL — AB (ref <150)
Total CHOL/HDL Ratio: 3.4 Ratio (ref <=5.0)
VLDL: 42 mg/dL — AB (ref <30)

## 2015-11-30 LAB — COMPLETE METABOLIC PANEL WITH GFR
ALT: 18 U/L (ref 9–46)
AST: 13 U/L (ref 10–35)
Albumin: 4.5 g/dL (ref 3.6–5.1)
Alkaline Phosphatase: 51 U/L (ref 40–115)
BILIRUBIN TOTAL: 0.5 mg/dL (ref 0.2–1.2)
BUN: 33 mg/dL — AB (ref 7–25)
CALCIUM: 9.6 mg/dL (ref 8.6–10.3)
CHLORIDE: 102 mmol/L (ref 98–110)
CO2: 26 mmol/L (ref 20–31)
CREATININE: 1.16 mg/dL (ref 0.70–1.25)
GFR, EST AFRICAN AMERICAN: 74 mL/min (ref >=60)
GFR, Est Non African American: 64 mL/min (ref >=60)
Glucose, Bld: 202 mg/dL — ABNORMAL HIGH (ref 70–99)
Potassium: 4.1 mmol/L (ref 3.5–5.3)
Sodium: 139 mmol/L (ref 135–146)
TOTAL PROTEIN: 6.8 g/dL (ref 6.1–8.1)

## 2015-11-30 LAB — CBC WITH DIFFERENTIAL/PLATELET
BASOS PCT: 0 %
Basophils Absolute: 0 cells/uL (ref 0–200)
EOS ABS: 110 {cells}/uL (ref 15–500)
Eosinophils Relative: 1 %
HEMATOCRIT: 47.5 % (ref 38.5–50.0)
Hemoglobin: 16.1 g/dL (ref 13.0–17.0)
LYMPHS ABS: 1760 {cells}/uL (ref 850–3900)
Lymphocytes Relative: 16 %
MCH: 31.9 pg (ref 27.0–33.0)
MCHC: 33.9 g/dL (ref 32.0–36.0)
MCV: 94.2 fL (ref 80.0–100.0)
MONO ABS: 770 {cells}/uL (ref 200–950)
MPV: 11.6 fL (ref 7.5–12.5)
Monocytes Relative: 7 %
NEUTROS ABS: 8360 {cells}/uL — AB (ref 1500–7800)
Neutrophils Relative %: 76 %
Platelets: 232 10*3/uL (ref 140–400)
RBC: 5.04 MIL/uL (ref 4.20–5.80)
RDW: 14.5 % (ref 11.0–15.0)
WBC: 11 10*3/uL — AB (ref 3.8–10.8)

## 2015-11-30 LAB — HEMOGLOBIN A1C
HEMOGLOBIN A1C: 7.3 % — AB (ref <5.7)
MEAN PLASMA GLUCOSE: 163 mg/dL

## 2015-11-30 LAB — TSH: TSH: 2.16 m[IU]/L (ref 0.40–4.50)

## 2015-11-30 NOTE — Progress Notes (Signed)
Subjective:    Patient ID: Caleb Butler, male    DOB: 09-27-1945, 70 y.o.   MRN: [id number]  HPI He is here to establish care.  PMH is significant for DMII, hypothyroidism, HLD.  He recently started prednisone for sciatica under the care of orthopedist.  Colonosocpy was 2013.  Overdue for prostate exam, hep c test, pneumovax, tdap, and zostavax.  He has no concerns. Past Medical History  Diagnosis Date  . Diabetes mellitus   . Thyroid disease   . Hiatal hernia    Past Surgical History  Procedure Laterality Date  . Right arm      No current outpatient prescriptions on file prior to visit.   No current facility-administered medications on file prior to visit.   Allergies  Allergen Reactions  . Bee Venom   . Shellfish Allergy    Social History   Social History  . Marital Status: Married    Spouse Name: N/A  . Number of Children: N/A  . Years of Education: N/A   Occupational History  . Not on file.   Social History Main Topics  . Smoking status: Never Smoker   . Smokeless tobacco: Not on file  . Alcohol Use: Yes     Comment: occ  . Drug Use: No  . Sexual Activity: Not on file   Other Topics Concern  . Not on file   Social History Narrative   No family history on file.    Review of Systems  All other systems reviewed and are negative.      Objective:   Physical Exam  Constitutional: He is oriented to person, place, and time. He appears well-developed and well-nourished. No distress.  HENT:  Head: Normocephalic and atraumatic.  Right Ear: External ear normal.  Left Ear: External ear normal.  Nose: Nose normal.  Mouth/Throat: Oropharynx is clear and moist. No oropharyngeal exudate.  Eyes: Conjunctivae and EOM are normal. Pupils are equal, round, and reactive to light. Right eye exhibits no discharge. Left eye exhibits no discharge. No scleral icterus.  Neck: Normal range of motion. Neck supple. No JVD present. No tracheal deviation present. No  thyromegaly present.  Cardiovascular: Normal rate, regular rhythm, normal heart sounds and intact distal pulses.  Exam reveals no gallop and no friction rub.   No murmur heard. Pulmonary/Chest: Effort normal and breath sounds normal. No stridor. No respiratory distress. He has no wheezes. He has no rales. He exhibits no tenderness.  Abdominal: Soft. Bowel sounds are normal. He exhibits no distension and no mass. There is no tenderness. There is no rebound and no guarding.  Genitourinary: Rectum normal and prostate normal.  Musculoskeletal: Normal range of motion. He exhibits no edema or tenderness.  Lymphadenopathy:    He has no cervical adenopathy.  Neurological: He is alert and oriented to person, place, and time. He has normal reflexes. He displays normal reflexes. No cranial nerve deficit. He exhibits normal muscle tone. Coordination normal.  Skin: Skin is warm. No rash noted. He is not diaphoretic. No erythema. No pallor.  Psychiatric: He has a normal mood and affect. His behavior is normal. Judgment and thought content normal.  Vitals reviewed.         Assessment & Plan:  Controlled type 2 diabetes mellitus without complication, without long-term current use of insulin ([place]) - Plan: CBC with Differential/Platelet, COMPLETE METABOLIC PANEL WITH GFR, Lipid panel, Microalbumin, urine, Hemoglobin A1c, Hepatitis C Ab Reflex HCV RNA, QUANT, Tdap vaccine greater than or equal  to 7yo IM  Other specified hypothyroidism - Plan: TSH, Tdap vaccine greater than or equal to 7yo IM  Prostate cancer screening - Plan: PSA  Need for hepatitis C screening test - Plan: Hepatitis C Ab Reflex HCV RNA, QUANT  Need for Tdap vaccination - Plan: Tdap vaccine greater than or equal to 7yo IM  We will check HgA1c and urine microalbumin.  Check LDL and goal is less than 100.  CHeck TSh.  Screen for hep c.  Check PSA.  Colonosocopy up to date.  Receved Tdap today.  Defers pneumovax and Zostavax until next  visit.

## 2015-12-01 LAB — MICROALBUMIN, URINE: Microalb, Ur: 6.9 mg/dL

## 2015-12-01 LAB — PSA: PSA: 1.16 ng/mL (ref <=4.00)

## 2015-12-01 LAB — HEPATITIS C ANTIBODY: HCV Ab: NEGATIVE

## 2015-12-02 ENCOUNTER — Other Ambulatory Visit: Payer: Self-pay | Admitting: Family Medicine

## 2015-12-02 ENCOUNTER — Encounter: Payer: Self-pay | Admitting: Family Medicine

## 2015-12-02 MED ORDER — METFORMIN HCL 1000 MG PO TABS: 1000.0000 mg | ORAL_TABLET | Freq: Two times a day (BID) | ORAL | Status: DC

## 2015-12-03 DIAGNOSIS — R0602 Shortness of breath: Secondary | ICD-10-CM | POA: Diagnosis not present

## 2015-12-04 DIAGNOSIS — E119 Type 2 diabetes mellitus without complications: Secondary | ICD-10-CM | POA: Diagnosis not present

## 2015-12-04 DIAGNOSIS — H2513 Age-related nuclear cataract, bilateral: Secondary | ICD-10-CM | POA: Diagnosis not present

## 2015-12-07 ENCOUNTER — Other Ambulatory Visit (HOSPITAL_COMMUNITY): Payer: Self-pay | Admitting: Respiratory Therapy

## 2015-12-07 DIAGNOSIS — R0602 Shortness of breath: Secondary | ICD-10-CM

## 2015-12-16 ENCOUNTER — Ambulatory Visit (HOSPITAL_COMMUNITY)
Admission: RE | Admit: 2015-12-16 | Discharge: 2015-12-16 | Disposition: A | Discharge status: Home / Self Care | Payer: Medicare Other | Source: Ambulatory Visit | Attending: Pulmonary Disease | Admitting: Pulmonary Disease

## 2015-12-16 DIAGNOSIS — R0602 Shortness of breath: Secondary | ICD-10-CM | POA: Diagnosis not present

## 2015-12-16 MED ORDER — ALBUTEROL SULFATE (2.5 MG/3ML) 0.083% IN NEBU
2.5000 mg | INHALATION_SOLUTION | Freq: Once | RESPIRATORY_TRACT | Status: AC
  Administered 2015-12-16: 2.5 mg via RESPIRATORY_TRACT

## 2015-12-19 LAB — PULMONARY FUNCTION TEST
DL/VA % PRED: 100 %
DL/VA: 4.54 ml/min/mmHg/L
DLCO UNC % PRED: 66 %
DLCO unc: 20.7 ml/min/mmHg
FEF 25-75 POST: 2.12 L/s
FEF 25-75 PRE: 1.52 L/s
FEF2575-%CHANGE-POST: 39 %
FEF2575-%PRED-POST: 87 %
FEF2575-%PRED-PRE: 63 %
FEV1-%Change-Post: 7 %
FEV1-%Pred-Post: 69 %
FEV1-%Pred-Pre: 65 %
FEV1-Post: 2.2 L
FEV1-Pre: 2.05 L
FEV1FVC-%CHANGE-POST: 7 %
FEV1FVC-%Pred-Pre: 100 %
FEV6-%CHANGE-POST: 0 %
FEV6-%PRED-PRE: 67 %
FEV6-%Pred-Post: 67 %
FEV6-POST: 2.7 L
FEV6-PRE: 2.72 L
FEV6FVC-%Change-Post: 0 %
FEV6FVC-%PRED-PRE: 105 %
FEV6FVC-%Pred-Post: 106 %
FVC-%CHANGE-POST: 0 %
FVC-%PRED-POST: 63 %
FVC-%Pred-Pre: 64 %
FVC-Post: 2.73 L
FVC-Pre: 2.75 L
POST FEV1/FVC RATIO: 81 %
Post FEV6/FVC ratio: 100 %
Pre FEV1/FVC ratio: 75 %
Pre FEV6/FVC Ratio: 99 %
RV % PRED: 112 %
RV: 2.69 L
TLC % pred: 77 %
TLC: 5.26 L

## 2016-01-14 ENCOUNTER — Telehealth: Payer: Self-pay | Admitting: Family Medicine

## 2016-01-14 NOTE — Telephone Encounter (Signed)
They are in Epic just wanted to make sure you seen them before I called him back!

## 2016-01-14 NOTE — Telephone Encounter (Signed)
Patient had respitory test done and would like to know if dr pickard got these results  Please call [phone]

## 2016-01-15 NOTE — Telephone Encounter (Signed)
YEs I have the results.

## 2016-01-15 NOTE — Telephone Encounter (Signed)
Pt states that the MD that did the test just told him it was abnormal and nothing else. He has put in a call to their office but no one has called him back. Made and appt to come in and discuss results with [facility]

## 2016-01-18 ENCOUNTER — Encounter: Payer: Self-pay | Admitting: Family Medicine

## 2016-01-18 ENCOUNTER — Ambulatory Visit (INDEPENDENT_AMBULATORY_CARE_PROVIDER_SITE_OTHER): Payer: Medicare Other | Admitting: Family Medicine

## 2016-01-18 VITALS — BP 114/84 | HR 76 | Temp 97.6°F | Resp 18 | Wt 196.0 lb

## 2016-01-18 DIAGNOSIS — R942 Abnormal results of pulmonary function studies: Secondary | ICD-10-CM

## 2016-01-18 DIAGNOSIS — R0609 Other forms of dyspnea: Secondary | ICD-10-CM | POA: Diagnosis not present

## 2016-01-18 DIAGNOSIS — Z77090 Contact with and (suspected) exposure to asbestos: Secondary | ICD-10-CM | POA: Diagnosis not present

## 2016-01-18 NOTE — Progress Notes (Signed)
Subjective:    Patient ID: Caleb Butler, male    DOB: [birth year], 70 y.o.   MRN: [id number]  HPI 11/30/15 He is here to establish care.  PMH is significant for DMII, hypothyroidism, HLD.  He recently started prednisone for sciatica under the care of orthopedist.  Colonosocpy was 2013.  Overdue for prostate exam, hep c test, pneumovax, tdap, and zostavax.  He has no concerns.  AT that time, my plan was: We will check HgA1c and urine microalbumin.  Check LDL and goal is less than 100.  CHeck TSh.  Screen for hep c.  Check PSA.  Colonosocopy up to date.  Receved Tdap today.  Defers pneumovax and Zostavax until next visit.  01/18/16 Patient reports progressive shortness of breath and dyspnea on exertion. He states this is been going on for last 15 years steadily worsening. He reports having had a nuclear stress test from his previous cardiologist that was reportedly normal 2 years ago. He denies any echocardiogram. He denies any orthopnea or paroxysmal nocturnal dyspnea. However he gets easily winded just walking outside and feet his horses. He does have a history of working around large quantities of his spasticity for many years growing up a lot of exposure to agent orange in [place]. Patient was recently referred to a pulmonologist who performed pulmonary function tests which are listed in [place]. Prebronchodilator FEV1 to FVC ratio 75%. Postbronchodilator FEV1 to FVC ratio was 82% both of which are normal. However he had diminished lung volumes of 65% in both FEV1 and FVC. This suggests possible interstitial lung disease. I see no chest x-ray or CT scans performed of his lungs. Past Medical History:  Diagnosis Date  . Diabetes mellitus   . Hiatal hernia   . Thyroid disease    Past Surgical History:  Procedure Laterality Date  . right arm      Current Outpatient Prescriptions on File Prior to Visit  Medication Sig Dispense Refill  . aspirin 81 MG EC tablet Take by mouth.    .  Cyanocobalamin (VITAMIN B 12) 100 MCG LOZG Take by mouth.    . Glucose Blood (BLOOD GLUCOSE TEST STRIPS) STRP     . glucose blood test strip     . levothyroxine (SYNTHROID, LEVOTHROID) 50 MCG tablet Take by mouth.    . metFORMIN (GLUCOPHAGE) 1000 MG tablet Take 1 tablet (1,000 mg total) by mouth 2 (two) times daily with a meal. 180 tablet 3  . simvastatin (ZOCOR) 20 MG tablet Take 20 mg by mouth at bedtime.      No current facility-administered medications on file prior to visit.    Allergies  Allergen Reactions  . Bee Venom   . Shellfish Allergy    Social History   Social History  . Marital status: Married    Spouse name: N/A  . Number of children: N/A  . Years of education: N/A   Occupational History  . Not on file.   Social History Main Topics  . Smoking status: Never Smoker  . Smokeless tobacco: Not on file  . Alcohol use Yes     Comment: occ  . Drug use: No  . Sexual activity: Not on file   Other Topics Concern  . Not on file   Social History Narrative  . No narrative on file   No family history on file.    Review of Systems  All other systems reviewed and are negative.      Objective:   Physical  Exam  Constitutional: He is oriented to person, place, and time. He appears well-developed and well-nourished. No distress.  HENT:  Head: Normocephalic and atraumatic.  Right Ear: External ear normal.  Left Ear: External ear normal.  Nose: Nose normal.  Mouth/Throat: Oropharynx is clear and moist. No oropharyngeal exudate.  Eyes: Conjunctivae and EOM are normal. Pupils are equal, round, and reactive to light. Right eye exhibits no discharge. Left eye exhibits no discharge. No scleral icterus.  Neck: Normal range of motion. Neck supple. No JVD present. No tracheal deviation present. No thyromegaly present.  Cardiovascular: Normal rate, regular rhythm, normal heart sounds and intact distal pulses.  Exam reveals no gallop and no friction rub.   No murmur  heard. Pulmonary/Chest: Effort normal and breath sounds normal. No stridor. No respiratory distress. He has no wheezes. He has no rales. He exhibits no tenderness.  Abdominal: Soft. Bowel sounds are normal. He exhibits no distension and no mass. There is no tenderness. There is no rebound and no guarding.  Genitourinary: Rectum normal and prostate normal.  Musculoskeletal: Normal range of motion. He exhibits no edema or tenderness.  Lymphadenopathy:    He has no cervical adenopathy.  Neurological: He is alert and oriented to person, place, and time. He has normal reflexes. No cranial nerve deficit. He exhibits normal muscle tone. Coordination normal.  Skin: Skin is warm. No rash noted. He is not diaphoretic. No erythema. No pallor.  Psychiatric: He has a normal mood and affect. His behavior is normal. Judgment and thought content normal.  Vitals reviewed.         Assessment & Plan:  Dyspnea on exertion, low lung volumes, asbestos exposure.   CBC in July revealed no evidence of anemia. I will obtain previous workup performed by his cardiologist. If necessary, I'll perform an echocardiogram to evaluate for systolic or diastolic heart failure however his symptoms do not sound compatible with this. Rather I am concerned about interstitial lung disease given his diminished lung volumes. I will obtain a CT scan of the chest to evaluate further and will likely consult pulmonology for evaluation.

## 2016-02-04 ENCOUNTER — Ambulatory Visit
Admission: RE | Admit: 2016-02-04 | Discharge: 2016-02-04 | Disposition: A | Discharge status: Home / Self Care | Payer: Medicare Other | Source: Ambulatory Visit | Attending: Family Medicine | Admitting: Family Medicine

## 2016-02-04 DIAGNOSIS — R0609 Other forms of dyspnea: Principal | ICD-10-CM

## 2016-02-04 DIAGNOSIS — Z77090 Contact with and (suspected) exposure to asbestos: Secondary | ICD-10-CM

## 2016-02-04 DIAGNOSIS — R942 Abnormal results of pulmonary function studies: Secondary | ICD-10-CM

## 2016-02-04 MED ORDER — IOPAMIDOL (ISOVUE-300) INJECTION 61%
100.0000 mL | Freq: Once | INTRAVENOUS | Status: AC | PRN
  Administered 2016-02-04: 100 mL via INTRAVENOUS

## 2016-02-19 ENCOUNTER — Other Ambulatory Visit: Payer: Self-pay | Admitting: Family Medicine

## 2016-02-19 DIAGNOSIS — R942 Abnormal results of pulmonary function studies: Secondary | ICD-10-CM

## 2016-02-19 DIAGNOSIS — R0609 Other forms of dyspnea: Principal | ICD-10-CM

## 2016-02-19 DIAGNOSIS — Z77090 Contact with and (suspected) exposure to asbestos: Secondary | ICD-10-CM

## 2016-02-25 ENCOUNTER — Ambulatory Visit (INDEPENDENT_AMBULATORY_CARE_PROVIDER_SITE_OTHER): Payer: Medicare Other | Admitting: *Deleted

## 2016-02-25 DIAGNOSIS — Z23 Encounter for immunization: Secondary | ICD-10-CM

## 2016-02-25 NOTE — Progress Notes (Signed)
Patient seen in office for Influenza Vaccination.   Tolerated IM administration well.   Immunization history updated.  

## 2016-02-26 ENCOUNTER — Encounter: Payer: Self-pay | Admitting: Cardiology

## 2016-02-26 ENCOUNTER — Ambulatory Visit (INDEPENDENT_AMBULATORY_CARE_PROVIDER_SITE_OTHER): Payer: Medicare Other | Admitting: Cardiology

## 2016-02-26 VITALS — BP 110/80 | HR 78 | Ht 69.0 in | Wt 193.0 lb

## 2016-02-26 DIAGNOSIS — Z79899 Other long term (current) drug therapy: Secondary | ICD-10-CM | POA: Diagnosis not present

## 2016-02-26 DIAGNOSIS — I25810 Atherosclerosis of coronary artery bypass graft(s) without angina pectoris: Secondary | ICD-10-CM | POA: Diagnosis not present

## 2016-02-26 DIAGNOSIS — E119 Type 2 diabetes mellitus without complications: Secondary | ICD-10-CM | POA: Diagnosis not present

## 2016-02-26 DIAGNOSIS — R0602 Shortness of breath: Secondary | ICD-10-CM | POA: Diagnosis not present

## 2016-02-26 DIAGNOSIS — R0609 Other forms of dyspnea: Secondary | ICD-10-CM

## 2016-02-26 NOTE — Progress Notes (Signed)
Cardiology Office Note  Date: 02/26/2016   ID: Micaiah, Schuhmacher 1946/01/12, MRN [id number]  PCP: Odette Fraction, MD  Consulting Cardiologist: Rozann Lesches, MD   Chief Complaint  Patient presents with  . Shortness of Breath    History of Present Illness: Caleb Butler is a 70 y.o. male referred for cardiology consultation by Dr. Dennard Schaumann. Patient was seen back in August reporting a long-standing history of dyspnea on exertion that has been progressive. He had no known history of chronic lung disease but prior reported exposure to [place] in [place] and also asbestos. He had undergone pulmonary function testing that did not demonstrate obvious obstructive defects, but revealed decreased lung volumes. Chest CT ordered by Dr. Dennard Schaumann and outlined below did not demonstrate any obvious chronic lung disease, but evidence of coronary artery calcifications. Patient previously followed with Dr. Hamilton Capri with the [facility] cardiology practice, last seen in May of this year. Prior ischemic workup included a negative exercise echocardiogram done in 2015.  He is here today with his wife. He tells me that he has experienced dyspnea on exertion for at least 10 years, but that he has been getting progressively worse over the last few years. He has a farm with animals, notices shortness of breath and fatigue when he walks up hill from his barn to his house, also caring pails of food, or bending over to tend to things on the ground. He has to stop what he is doing sometimes. No definite exertional chest pain. Also reports trouble with a hiatal hernia and reflux. He states that he was given a pulmonary inhaler to try, but it has not been effective. No cough or hemoptysis, no recurring wheezing.  In talking with him today, it sounds like he has undergone several different cardiac evaluations over the last decade, initially with [facility], then [facility]. He has had at least 2 stress tests  during this time, also echocardiogram years ago but I cannot locate the results. He is under the impression that he may have had some type of "valve problem."  Past Medical History:  Diagnosis Date  . Hiatal hernia   . Hypothyroidism   . Type 2 diabetes mellitus ([place])     Past Surgical History:  Procedure Laterality Date  . Arm surgery Right     Current Outpatient Prescriptions  Medication Sig Dispense Refill  . aspirin 81 MG EC tablet Take by mouth.    . Cyanocobalamin (VITAMIN B 12) 100 MCG LOZG Take by mouth.    . Glucose Blood (BLOOD GLUCOSE TEST STRIPS) STRP     . glucose blood test strip     . levothyroxine (SYNTHROID, LEVOTHROID) 50 MCG tablet Take 50 mcg by mouth daily.  3  . metFORMIN (GLUCOPHAGE) 1000 MG tablet Take 1 tablet (1,000 mg total) by mouth 2 (two) times daily with a meal. 180 tablet 3  . simvastatin (ZOCOR) 20 MG tablet Take 20 mg by mouth at bedtime.      No current facility-administered medications for this visit.    Allergies:  Bee venom and Shellfish allergy   Social History: The patient  reports that he has never smoked. He does not have any smokeless tobacco history on file. He reports that he drinks alcohol. He reports that he does not use drugs.   Family History: The patient's family history includes Hypertension in his father.   ROS:  Please see the history of present illness. Otherwise, complete review of systems is positive for  plantar fasciitis.  All other systems are reviewed and negative.   Physical Exam: VS:  BP 110/80   Pulse 78   Ht 5\' 9"  (1.753 m)   Wt 193 lb (87.5 kg)   SpO2 96%   BMI 28.50 kg/m , BMI Body mass index is 28.5 kg/m.  Wt Readings from Last 3 Encounters:  02/26/16 193 lb (87.5 kg)  01/18/16 196 lb (88.9 kg)  11/30/15 196 lb (88.9 kg)    General: Patient appears comfortable at rest. HEENT: Conjunctiva and lids normal, oropharynx clear. Neck: Supple, no elevated JVP or carotid bruits, no thyromegaly. Lungs: Clear  to auscultation, nonlabored breathing at rest. Cardiac: Regular rate and rhythm, no S3, soft systolic murmur, no pericardial rub. Abdomen: Soft, nontender, bowel sounds present, no guarding or rebound. Extremities: No pitting edema, distal pulses 2+. Skin: Warm and dry. Musculoskeletal: No kyphosis. Neuropsychiatric: Alert and oriented x3, affect grossly appropriate.  ECG: No old tracing available for comparison. Prior tracings with described nonspecific ST changes.  Recent Labwork: 11/30/2015: ALT 18; AST 13; BUN 33; Creat 1.16; Hemoglobin 16.1; Platelets 232; Potassium 4.1; Sodium 139; TSH 2.16     Component Value Date/Time   CHOL 140 11/30/2015 1030   TRIG 210 (H) 11/30/2015 1030   HDL 41 11/30/2015 1030   CHOLHDL 3.4 11/30/2015 1030   VLDL 42 (H) 11/30/2015 1030   LDLCALC 57 11/30/2015 1030    Other Studies Reviewed Today:  Chest CT 02/04/2016: FINDINGS: Creatinine was obtained on site at [facility] at [address]  Results: Creatinine 1.0 mg/dL.  Cardiovascular: The heart size is normal. No pericardial effusion. Coronary artery calcification is noted. No thoracic aortic aneurysm.  Mediastinum/Nodes: No mediastinal lymphadenopathy. There is no hilar lymphadenopathy. The esophagus has normal imaging features. There is no axillary lymphadenopathy.  Lungs/Pleura: The tiny calcified granuloma noted in the right apex. Subsegmental atelectasis noted dependently in the lower lobes. No focal airspace consolidation. No pulmonary edema or pleural effusion. No features to suggest underlying chronic interstitial lung disease.  Upper Abdomen: Unremarkable.  Musculoskeletal: Bone windows reveal no worrisome lytic or sclerotic osseous lesions.  IMPRESSION: 1. No acute cardiopulmonary findings. 2. Coronary artery atherosclerosis.  Assessment and Plan:  1. Long-standing dyspnea on exertion, progressive over the last few years. Patient has type 2 diabetes  mellitus, no history of hypertension, is on a statin to keep lipids in check. Recent LDL was only 57. No definite obstructive defect by PFTs based on description of report, recent chest CT did not demonstrate any acute pulmonary findings, but didn't incidentally mentioned coronary artery calcifications. He underwent an exercise echo with no vomiting 2 years ago, has never undergone any invasive cardiac evaluations. Based on progressive symptoms, I have recommended that he consider a right and left heart catheterization for more definitive cardiac evaluation. We discussed the risks and benefits of this, he would like to think about it first and then will call us back next week with a decision. In the meanwhile we will arrange an echocardiogram to evaluate cardiac structure and function. Examination does not suggest any major degree of valvular disease however.  2. Type 2 diabetes mellitus, on Glucophage.  3. Hypothyroidism, on Synthroid. Recent TSH 2.16.  4. On Zocor with LDL 57.  Current medicines were reviewed with the patient today.   Orders Placed This Encounter  Procedures  . ECHOCARDIOGRAM COMPLETE    Disposition: Patient to call back regarding decision on cardiac catheterization.  Signed, Satira Sark, MD, [facility] 02/26/2016  1:09 PM    [facility] at [facility] [address] [place], [place] [address] Phone: [phone]; Fax: [phone]

## 2016-02-26 NOTE — Patient Instructions (Signed)
Medication Instructions:  Your physician recommends that you continue on your current medications as directed. Please refer to the Current Medication list given to you today.   Labwork: none  Testing/Procedures: Your physician has requested that you have an echocardiogram. Echocardiography is a painless test that uses sound waves to create images of your heart. It provides your doctor with information about the size and shape of your heart and how well your heart's chambers and valves are working. This procedure takes approximately one hour. There are no restrictions for this procedure.  Your physician has requested that you have a cardiac catheterization. Cardiac catheterization is used to diagnose and/or treat various heart conditions. Doctors may recommend this procedure for a number of different reasons. The most common reason is to evaluate chest pain. Chest pain can be a symptom of coronary artery disease (CAD), and cardiac catheterization can show whether plaque is narrowing or blocking your heart's arteries. This procedure is also used to evaluate the valves, as well as measure the blood flow and oxygen levels in different parts of your heart. For further information please visit [email or web address]. Please follow instruction sheet, as given.  PLEASE CALL us TO LET us KNOW ONCE YOU DECIDE ON YOUR HEART CATH [phone]  Follow-Up: Your physician recommends that you schedule a follow-up appointment in: TO BE DETERMINED    Any Other Special Instructions Will Be Listed Below (If Applicable).     If you need a refill on your cardiac medications before your next appointment, please call your pharmacy.

## 2016-03-04 ENCOUNTER — Ambulatory Visit (HOSPITAL_COMMUNITY)
Admission: RE | Admit: 2016-03-04 | Discharge: 2016-03-04 | Disposition: A | Discharge status: Home / Self Care | Payer: Medicare Other | Source: Ambulatory Visit | Attending: Cardiology | Admitting: Cardiology

## 2016-03-04 DIAGNOSIS — I081 Rheumatic disorders of both mitral and tricuspid valves: Secondary | ICD-10-CM | POA: Diagnosis not present

## 2016-03-04 DIAGNOSIS — R0602 Shortness of breath: Secondary | ICD-10-CM | POA: Diagnosis not present

## 2016-03-04 DIAGNOSIS — I5030 Unspecified diastolic (congestive) heart failure: Secondary | ICD-10-CM | POA: Diagnosis not present

## 2016-03-04 DIAGNOSIS — R0609 Other forms of dyspnea: Secondary | ICD-10-CM | POA: Insufficient documentation

## 2016-03-04 NOTE — Progress Notes (Signed)
*  PRELIMINARY RESULTS* Echocardiogram 2D Echocardiogram has been performed.  Caleb Butler 03/04/2016, 2:48 PM

## 2016-03-08 DIAGNOSIS — M722 Plantar fascial fibromatosis: Secondary | ICD-10-CM | POA: Diagnosis not present

## 2016-03-08 DIAGNOSIS — M79673 Pain in unspecified foot: Secondary | ICD-10-CM | POA: Diagnosis not present

## 2016-03-09 ENCOUNTER — Ambulatory Visit (INDEPENDENT_AMBULATORY_CARE_PROVIDER_SITE_OTHER): Payer: Medicare Other | Admitting: Internal Medicine

## 2016-03-09 ENCOUNTER — Encounter: Payer: Self-pay | Admitting: Internal Medicine

## 2016-03-09 VITALS — BP 102/70 | HR 74 | Ht 69.0 in | Wt 195.6 lb

## 2016-03-09 DIAGNOSIS — R0609 Other forms of dyspnea: Secondary | ICD-10-CM | POA: Insufficient documentation

## 2016-03-09 DIAGNOSIS — R0600 Dyspnea, unspecified: Secondary | ICD-10-CM

## 2016-03-09 DIAGNOSIS — I25810 Atherosclerosis of coronary artery bypass graft(s) without angina pectoris: Secondary | ICD-10-CM | POA: Diagnosis not present

## 2016-03-09 DIAGNOSIS — R0689 Other abnormalities of breathing: Secondary | ICD-10-CM

## 2016-03-09 DIAGNOSIS — R942 Abnormal results of pulmonary function studies: Secondary | ICD-10-CM

## 2016-03-09 NOTE — Patient Instructions (Addendum)
ICD-9-CM ICD-10-CM   1. Dyspnea and respiratory abnormality 786.09 R06.00     R06.89   2. Abnormal PFT 794.2 R94.2    Unclear cause  Not due to asthma  PLAN Need to rule out pulmonary scarring - do HRCT chest  If this is normal, next step is pulmonary stress test (will get cards clearance for this)  Followup  -over phone as results come in

## 2016-03-09 NOTE — Progress Notes (Signed)
Subjective:    Patient ID: Caleb Butler, male    DOB: 1946/01/28, 70 y.o.   MRN: [id number]  PCP Caleb Fraction, MD  HPI   IOV 03/09/2016  Chief Complaint  Patient presents with  . Pulmonary Consult    Pt referred by Dr. Dennard Butler for abnormal PFT. Pt c/o DOE x 15 years - worsened over the past years. Pt denies cough, orthopnea, swelling, CP/tightness and f/c/s.    70 year old gentleman who lives in [place] for the last 10 years. He used to live in [place]. He was a [place] War veteran between 1968 and 53 and exposed to agent orange. According to his history he was exposed to the most agent orange. He reports insidious onset of shortness of breath for the last 10-15 years. It is progressive especially in the last 5 years. 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 This is significantly worse than a year or 2 ago. It is not associated with chest pain or orthopnea or paroxysmal nocturnal dyspnea or coughing or chest tightness. He is not sure what wheezing but apparently his wife thinks that he might be wheezing. She confirmed this to me. Although it is not clear when he wheezes. Dyspnea is rated as mild to moderate. Brought on by exertion and definitely relieved by rest.  Pertinent tests that is below  Pulmonary function test 12/07/2015 shows FVC 2.75 L/64%, FEV1 2.05 L/65% and a ratio of 75 near normal/100%. No postbronchodilator response in FVC but there was 150 ML postbronchodilator response in FEV1 to 2.20 L/69% with a ratio of 81. Total lung capacity 5.6/77 consistent with restriction. DLCO 20.7/66% with reduced diffusion capacity. Overall pulmonary function test raises the possibility of intrinsic lung disease of a restrictive nature   CT chest with contrast 02/04/2016: No mediastinal adenopathy. Reported as clear lung fields but then this is  not a high-resolution CT chest. I personally think after visualizing the film that might be some basal ILD findings are subtle. He does have coronary atherosclerosis  Echocardiogram 03/04/2016 shows grade 2 diastolic dysfunction with normal left ventricle systolic ejection Butler and normal pulmonary artery systolic pressures  Blood work 11/30/2015 shows normal creatinine 1.16 mg percent with a GFR of 74. Hemoglobin elevated at 16.1 g percent.\\\   He did see cardiologist Dr. Domenic Butler on 02/24/2016. I visualized the note and reviewed it. Given progressive nature of symptoms and previous negative cardiac stress test in 2015 Dr. Domenic Butler wants to perform a right and left heart catheterization. However patient prefers noninvasive approach and wants pulmonary etiologies ruled out and therefore he is here.  Walking desaturation test 185 feet 3 laps on room air 03/09/2016 - dpulse ox statyed at 96% . HR 64 -> 87    Exhaled nitric oxide today 03/09/2016 - 21ppb and normal   has a past medical history of Hiatal hernia; Hypothyroidism; and Type 2 diabetes mellitus (Caleb Butler).   reports that he has never smoked. He has never used smokeless tobacco.  Past Surgical History:  Procedure Laterality Date  . Arm surgery Right   . FINGER SURGERY     Left right finger reattached.     Allergies  Allergen Reactions  . Bee Venom   . Shellfish Allergy     Immunization History  Administered Date(s) Administered  . Influenza,inj,Quad PF,36+ Mos 02/25/2016  . Tdap 11/30/2015  Family History  Problem Relation Age of Onset  . Diabetes Mellitus II Father   . Heart failure Father   . Kidney failure Father      Current Outpatient Prescriptions:  .  aspirin 81 MG EC tablet, Take by mouth., Disp: , Rfl:  .  Cyanocobalamin (VITAMIN B 12) 100 MCG LOZG, Take by mouth., Disp: , Rfl:  .  Glucose Blood (BLOOD GLUCOSE TEST STRIPS) STRP, , Disp: , Rfl:  .  glucose blood test strip, , Disp: , Rfl:  .   levothyroxine (SYNTHROID, LEVOTHROID) 50 MCG tablet, Take 50 mcg by mouth daily., Disp: , Rfl: 3 .  metFORMIN (GLUCOPHAGE) 1000 MG tablet, Take 1 tablet (1,000 mg total) by mouth 2 (two) times daily with a meal., Disp: 180 tablet, Rfl: 3 .  simvastatin (ZOCOR) 20 MG tablet, Take 20 mg by mouth at bedtime. , Disp: , Rfl:     Review of Systems  Constitutional: Positive for appetite change. Negative for fever and unexpected weight change.  HENT: Negative for congestion, dental problem, ear pain, nosebleeds, postnasal drip, rhinorrhea, sinus pressure, sneezing, sore throat and trouble swallowing.   Eyes: Negative for redness and itching.  Respiratory: Positive for shortness of breath. Negative for cough, chest tightness and wheezing.   Cardiovascular: Negative for palpitations and leg swelling.  Gastrointestinal: Positive for diarrhea. Negative for nausea and vomiting.  Genitourinary: Negative for dysuria.  Musculoskeletal: Negative for joint swelling.  Skin: Negative for rash.  Neurological: Negative for headaches.  Hematological: Does not bruise/bleed easily.  Psychiatric/Behavioral: Negative for dysphoric mood. The patient is not nervous/anxious.        Objective:   Physical Exam  Constitutional: He is oriented to person, place, and time. He appears well-developed and well-nourished. No distress.  HENT:  Head: Normocephalic and atraumatic.  Right Ear: External ear normal.  Left Ear: External ear normal.  Mouth/Throat: Oropharynx is clear and moist. No oropharyngeal exudate.  Eyes: Conjunctivae and EOM are normal. Pupils are equal, round, and reactive to light. Right eye exhibits no discharge. Left eye exhibits no discharge. No scleral icterus.  Neck: Normal range of motion. Neck supple. No JVD present. No tracheal deviation present. No thyromegaly present.  Cardiovascular: Normal rate, regular rhythm and intact distal pulses.  Exam reveals no gallop and no friction rub.   No murmur  heard. Pulmonary/Chest: Effort normal and breath sounds normal. No respiratory distress. He has no wheezes. He has no rales. He exhibits no tenderness.  ? Crackle at base - unsure  Abdominal: Soft. Bowel sounds are normal. He exhibits no distension and no mass. There is no tenderness. There is no rebound and no guarding.  Mild viscreral obesity  Musculoskeletal: Normal range of motion. He exhibits no edema or tenderness.  Lymphadenopathy:    He has no cervical adenopathy.  Neurological: He is alert and oriented to person, place, and time. He has normal reflexes. No cranial nerve deficit. Coordination normal.  Skin: Skin is warm and dry. No rash noted. He is not diaphoretic. No erythema. No pallor.  Psychiatric: He has a normal mood and affect. His behavior is normal. Judgment and thought content normal.  Nursing note and vitals reviewed.  Vitals:   03/09/16 0924  BP: 102/70  Pulse: 74  SpO2: 98%  Weight: 195 lb 9.6 oz (88.7 kg)  Height: 5\' 9"  (1.753 m)    Estimated body mass index is 28.89 kg/m as calculated from the following:   Height as of this encounter: 5\' 9"  (  1.753 m).   Weight as of this encounter: 195 lb 9.6 oz (88.7 kg).        Assessment & Plan:     ICD-9-CM ICD-10-CM   1. Dyspnea and respiratory abnormality 786.09 R06.00     R06.89   2. Abnormal PFT 794.2 R94.2    Based on the fact he was exposed to agent orange and is a male over 65 and he has progressive dyspnea and has only function test that is restrictive associated with low diffusion capacity the primary etiology to rule out interstitial lung disease. The CT chest that he had before is a CT chest without high resolution therefore he will definitely need a CT chest with high resolution look for ILD. If this test comes back negative then he has to be subjected to a pulmonary stress test [will need cardiology approval for this] versus getting cardiac catheterization right and left heart. Certainly his diastolic  dysfunction Contributing This Amount of Dyspnea Especially If It Is Grade 2. [This Is My Personal Experience]  He prefers noninvasive testing first and I will keep this in mind and going through the sequence    He and wife are agreeable with the plan   Dr. Brand Males, M.D., [facility].C.P Pulmonary and Critical Care Medicine Staff Physician [place] [facility] Pager: [phone], If no answer or between  15:00h - 7:00h: call [phone]  03/09/2016 10:28 AM

## 2016-03-10 LAB — NITRIC OXIDE: Nitric Oxide: 21

## 2016-03-10 NOTE — Addendum Note (Signed)
Addended by: Collier Salina on: 03/10/2016 04:15 PM   Modules accepted: Orders

## 2016-03-23 ENCOUNTER — Ambulatory Visit (HOSPITAL_COMMUNITY)
Admission: RE | Admit: 2016-03-23 | Discharge: 2016-03-23 | Disposition: A | Discharge status: Home / Self Care | Payer: Medicare Other | Source: Ambulatory Visit | Attending: Internal Medicine | Admitting: Internal Medicine

## 2016-03-23 DIAGNOSIS — R942 Abnormal results of pulmonary function studies: Secondary | ICD-10-CM

## 2016-03-23 DIAGNOSIS — R0602 Shortness of breath: Secondary | ICD-10-CM | POA: Diagnosis not present

## 2016-03-23 DIAGNOSIS — I2510 Atherosclerotic heart disease of native coronary artery without angina pectoris: Secondary | ICD-10-CM | POA: Insufficient documentation

## 2016-03-23 DIAGNOSIS — R0689 Other abnormalities of breathing: Secondary | ICD-10-CM

## 2016-03-23 DIAGNOSIS — I712 Thoracic aortic aneurysm, without rupture: Secondary | ICD-10-CM | POA: Diagnosis not present

## 2016-03-23 DIAGNOSIS — R0600 Dyspnea, unspecified: Secondary | ICD-10-CM

## 2016-03-23 DIAGNOSIS — D1779 Benign lipomatous neoplasm of other sites: Secondary | ICD-10-CM | POA: Insufficient documentation

## 2016-03-31 ENCOUNTER — Telehealth: Payer: Self-pay | Admitting: Internal Medicine

## 2016-03-31 DIAGNOSIS — R0600 Dyspnea, unspecified: Secondary | ICD-10-CM

## 2016-03-31 DIAGNOSIS — R0689 Other abnormalities of breathing: Principal | ICD-10-CM

## 2016-03-31 NOTE — Telephone Encounter (Signed)
lmtcb for pt.  

## 2016-03-31 NOTE — Telephone Encounter (Signed)
CT CHEST 03/23/16 - No ILD. But has coroaonary arrtery calcification  Plan - refer cards for stress test  -  does have coronary artery calcification and if no normal cardiac stress test past few years; please refer to cardiologist - [place] or Dr Einar Gip, first available  - rov with me in 4-8 week - give time to finish cardiac workup - if stress test is negative then needs cpst

## 2016-04-01 NOTE — Telephone Encounter (Signed)
Called and spoke to pt. Informed him of the results and recs per MR. Pt verbalized understanding and states he had a normal stress test within 5 years.  MR please advise if ok to go ahead and order a CPST. Thanks.

## 2016-04-01 NOTE — Telephone Encounter (Signed)
Yes go ahead and order cpst on bike with eib challenge and rov after that  Dr. Brand Males, M.D., [facility].C.P Pulmonary and Critical Care Medicine Staff Physician [place] [facility] Pager: [phone], If no answer or between  15:00h - 7:00h: call [phone]  04/01/2016 5:23 PM

## 2016-04-04 NOTE — Telephone Encounter (Signed)
Called and spoke with patient, he is aware of his CPST bike test scheduled 04/28/16 @3  pm [facility] & instruction sheet mailed to pt.

## 2016-04-04 NOTE — Telephone Encounter (Signed)
Called and spoke to pt. Informed him of the CPST, order placed. Pt verbalized understanding.   PCC's please advise when CPST has been scheduled so an OV can be made after CPST. Thanks.

## 2016-04-05 DIAGNOSIS — M79671 Pain in right foot: Secondary | ICD-10-CM | POA: Diagnosis not present

## 2016-04-05 DIAGNOSIS — M722 Plantar fascial fibromatosis: Secondary | ICD-10-CM | POA: Diagnosis not present

## 2016-04-05 DIAGNOSIS — E1140 Type 2 diabetes mellitus with diabetic neuropathy, unspecified: Secondary | ICD-10-CM | POA: Diagnosis not present

## 2016-04-05 NOTE — Telephone Encounter (Signed)
Pt called back-has been scheduled for 12 noon with TP tomorrow. Nothing more needed at this time.

## 2016-04-05 NOTE — Telephone Encounter (Signed)
Ok to be seen by APP  Dr. Brand Males, M.D., [facility].C.P Pulmonary and Critical Care Medicine Staff Physician [place] [facility] Pager: [phone], If no answer or between  15:00h - 7:00h: call [phone]  04/05/2016 2:03 PM

## 2016-04-05 NOTE — Telephone Encounter (Signed)
MR please advise if pt can be seen by APP to review CPST, you do not have any openings until Jan 2018.

## 2016-04-05 NOTE — Telephone Encounter (Signed)
Spoke with patient. He will call back shortly to confirm date and time to be seen tomorrow by TP or BO. Will ask for Katie.

## 2016-04-06 ENCOUNTER — Ambulatory Visit: Payer: Medicare Other | Admitting: Adult Health

## 2016-04-28 ENCOUNTER — Encounter (HOSPITAL_COMMUNITY): Payer: Medicare Other

## 2016-05-02 DIAGNOSIS — K5732 Diverticulitis of large intestine without perforation or abscess without bleeding: Secondary | ICD-10-CM | POA: Diagnosis not present

## 2016-05-02 DIAGNOSIS — R1032 Left lower quadrant pain: Secondary | ICD-10-CM | POA: Diagnosis not present

## 2016-05-05 ENCOUNTER — Ambulatory Visit (HOSPITAL_COMMUNITY): Payer: Medicare Other | Attending: Internal Medicine

## 2016-05-05 DIAGNOSIS — R600 Localized edema: Secondary | ICD-10-CM | POA: Diagnosis not present

## 2016-05-05 DIAGNOSIS — Z7984 Long term (current) use of oral hypoglycemic drugs: Secondary | ICD-10-CM | POA: Insufficient documentation

## 2016-05-05 DIAGNOSIS — E119 Type 2 diabetes mellitus without complications: Secondary | ICD-10-CM | POA: Diagnosis not present

## 2016-05-05 DIAGNOSIS — R0600 Dyspnea, unspecified: Secondary | ICD-10-CM

## 2016-05-05 DIAGNOSIS — Z7982 Long term (current) use of aspirin: Secondary | ICD-10-CM | POA: Diagnosis not present

## 2016-05-05 DIAGNOSIS — E039 Hypothyroidism, unspecified: Secondary | ICD-10-CM | POA: Diagnosis not present

## 2016-05-05 DIAGNOSIS — R0689 Other abnormalities of breathing: Secondary | ICD-10-CM

## 2016-05-09 ENCOUNTER — Ambulatory Visit (INDEPENDENT_AMBULATORY_CARE_PROVIDER_SITE_OTHER): Payer: Medicare Other | Admitting: Adult Health

## 2016-05-09 ENCOUNTER — Encounter: Payer: Self-pay | Admitting: Adult Health

## 2016-05-09 DIAGNOSIS — R0600 Dyspnea, unspecified: Secondary | ICD-10-CM

## 2016-05-09 DIAGNOSIS — R0689 Other abnormalities of breathing: Secondary | ICD-10-CM | POA: Diagnosis not present

## 2016-05-09 DIAGNOSIS — I25810 Atherosclerosis of coronary artery bypass graft(s) without angina pectoris: Secondary | ICD-10-CM

## 2016-05-09 NOTE — Patient Instructions (Addendum)
Follow up with Cardiology to schedule heart cath.  follow up Dr. Chase Caller in 2 months and As needed

## 2016-05-09 NOTE — Progress Notes (Signed)
Subjective:    Patient ID: Caleb Butler, male    DOB: 1946-04-24, 70 y.o.   MRN: [id number]  HPI 70 yo male never smoker seen for pulmonary consult 03/09/16 for progressive dyspnea /abnornal PFT    05/09/2016 Follow up : Dyspnea /CPST results  Patient presents for a two-month follow-up. Patient is a [place] veteran that was exposed to agent orange. He presented for pulmonary consult 03/09/2016 for progressive dyspnea. 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 Pulmonary function test July 2017 showed some moderate restriction with an FVC of 64%, FEV1 65%, ratio 75, her bronchodilator response. DLCO 66%. He does have diastolic heart failure with a previous echo in October showing grade 2 diastolic dysfunction, and normal left ventricular systolic ejection fraction  Patient is followed by cardiology and has been recommended to have a right and left heart catheterization. Last office visit. Patient walk test did not show any desaturations. And exhaled nitric oxide test was normal at 21 Patient was set up for high resolution CT chest that showed no evidence of interstitial lung disease. He did have some mild patchy bibasilar scarring. He was set up for a cardiopulmonary stress test that showed normal functional capacity, pattern of note breathing reserve, excessive dead space ventilation may indicate subtle ILD or pulmonary vascular process. Patient was recommended. If he has a right and left heart catheter done to check for right heart pressures at rest and with exercise to look for exercise-induced pulmonary hypertension.       Past Medical History:  Diagnosis Date  . Hiatal hernia   . Hypothyroidism   . Type 2 diabetes mellitus ([place])    Current Outpatient Prescriptions on File Prior to Visit  Medication Sig Dispense Refill  . aspirin 81 MG EC tablet Take by mouth.    .  Cyanocobalamin (VITAMIN B 12) 100 MCG LOZG Take by mouth.    . Glucose Blood (BLOOD GLUCOSE TEST STRIPS) STRP     . glucose blood test strip     . levothyroxine (SYNTHROID, LEVOTHROID) 50 MCG tablet Take 50 mcg by mouth daily.  3  . metFORMIN (GLUCOPHAGE) 1000 MG tablet Take 1 tablet (1,000 mg total) by mouth 2 (two) times daily with a meal. 180 tablet 3  . simvastatin (ZOCOR) 20 MG tablet Take 20 mg by mouth at bedtime.      No current facility-administered medications on file prior to visit.       Review of Systems Constitutional:   No  weight loss, night sweats,  Fevers, chills, + fatigue, or  lassitude.  HEENT:   No headaches,  Difficulty swallowing,  Tooth/dental problems, or  Sore throat,                No sneezing, itching, ear ache, nasal congestion, post nasal drip,   CV:  No chest pain,  Orthopnea, PND, swelling in lower extremities, anasarca, dizziness, palpitations, syncope.   GI  No heartburn, indigestion, abdominal pain, nausea, vomiting, diarrhea, change in bowel habits, loss of appetite, bloody stools.   Resp:    No wheezing.  No chest wall deformity  Skin: no rash or lesions.  GU: no dysuria, change in color of urine, no urgency or frequency.  No flank pain, no hematuria   MS:  No joint pain or swelling.  No decreased range of motion.  No back pain.  Psych:  No change in mood or affect. No depression or anxiety.  No memory loss.         Objective:   Physical Exam Vitals:   05/09/16 1127  BP: 110/70  Pulse: 64  Temp: 97.5 F (36.4 C)  TempSrc: Oral  SpO2: 97%  Weight: 192 lb 12.8 oz (87.5 kg)  Height: 5\' 10"  (1.778 m)   GEN: A/Ox3; pleasant , NAD, well nourished , elderly    HEENT:  [place]/AT,  EACs-clear, TMs-wnl, NOSE-clear, THROAT-clear, no lesions, no postnasal drip or exudate noted.   NECK:  Supple w/ fair ROM; no JVD; normal carotid impulses w/o bruits; no thyromegaly or nodules palpated; no lymphadenopathy.    RESP  Clear  P & A; w/o,  wheezes/ rales/ or rhonchi. no accessory muscle use, no dullness to percussion  CARD:  RRR, no m/r/g  , tr  peripheral edema, pulses intact, no cyanosis or clubbing.  GI:   Soft & nt; nml bowel sounds; no organomegaly or masses detected.   Musco: Warm bil, no deformities or joint swelling noted.   Neuro: alert, no focal deficits noted.    Skin: Warm, no lesions or rashes  Tammy Parrett NP-C  [facility]  05/09/2016        Assessment & Plan:

## 2016-05-12 NOTE — Assessment & Plan Note (Signed)
Progressive DOE with restriction on PFT  No sign of ILD on HRCT chest  CPST does show a possible pulmonary vascular process and is recommended that if RHC/LHC are done to do RHC at rest and exercise to check for exercise induced pulmonary HTN .  Does have underlying Diastolic Dysfunction that could be contributing.   Plan  Patient Instructions  Follow up with Cardiology to schedule heart cath.  follow up Dr. Chase Caller in 2 months and As needed

## 2016-05-16 DIAGNOSIS — R1032 Left lower quadrant pain: Secondary | ICD-10-CM | POA: Diagnosis not present

## 2016-05-16 DIAGNOSIS — K5732 Diverticulitis of large intestine without perforation or abscess without bleeding: Secondary | ICD-10-CM | POA: Diagnosis not present

## 2016-05-20 ENCOUNTER — Encounter: Payer: Self-pay | Admitting: Cardiology

## 2016-05-20 ENCOUNTER — Ambulatory Visit (INDEPENDENT_AMBULATORY_CARE_PROVIDER_SITE_OTHER): Payer: Medicare Other | Admitting: Cardiology

## 2016-05-20 VITALS — BP 104/66 | HR 77 | Ht 69.0 in | Wt 192.0 lb

## 2016-05-20 DIAGNOSIS — E119 Type 2 diabetes mellitus without complications: Secondary | ICD-10-CM | POA: Diagnosis not present

## 2016-05-20 DIAGNOSIS — Z79899 Other long term (current) drug therapy: Secondary | ICD-10-CM

## 2016-05-20 DIAGNOSIS — R0602 Shortness of breath: Secondary | ICD-10-CM

## 2016-05-20 DIAGNOSIS — I25810 Atherosclerosis of coronary artery bypass graft(s) without angina pectoris: Secondary | ICD-10-CM | POA: Diagnosis not present

## 2016-05-20 NOTE — Progress Notes (Signed)
Cardiology Office Note  Date: 05/20/2016   ID: Nickalaus, Crooke [birth year], MRN [id number]  PCP: Odette Fraction, MD  Primary Cardiologist: Rozann Lesches, MD   Chief Complaint  Patient presents with  . Shortness of Breath    History of Present Illness: Caleb Butler is a 70 y.o. male that I met in consultation back in September. He has had interval follow-up in the Pulmonary division, seen recently in the office and has undergone interval echocardiogram as well as cardiopulmonary stress test. He had a high-resolution chest CT that did not demonstrate significant ILD but did have some mild patchy bibasilar scarring. His history is outlined in my prior note. He has long-standing, progressive dyspnea on exertion and there is concern that he may have exercise-induced pulmonary hypertension based on his recent testing. Of note, I did recommend that he undergo a left and right heart catheterization when I saw him in September, and he did not want to pursue it.  He comes in today with his wife. He was referred back specifically to discuss setting up left and right heart catheterization (exercise with right heart catheterization). He tells me that he remains conflicted about this and is not ready to get it set up. I discussed with him the possibility of seeing [name] in the heart failure clinic to review the CPX results, discuss aspects of treatment for pulmonary hypertension if this is uncovered and also review the procedure more with him. He indicated that he would feel more comfortable going this route.  Past Medical History:  Diagnosis Date  . Hiatal hernia   . Hypothyroidism   . Type 2 diabetes mellitus ([place])     Past Surgical History:  Procedure Laterality Date  . Arm surgery Right   . FINGER SURGERY     Left right finger reattached.     Current Outpatient Prescriptions  Medication Sig Dispense Refill  . aspirin 81 MG EC tablet Take by mouth.      . Cyanocobalamin (VITAMIN B 12) 100 MCG LOZG Take by mouth.    . Glucose Blood (BLOOD GLUCOSE TEST STRIPS) STRP     . glucose blood test strip     . levothyroxine (SYNTHROID, LEVOTHROID) 50 MCG tablet Take 50 mcg by mouth daily.  3  . metFORMIN (GLUCOPHAGE) 1000 MG tablet Take 1 tablet (1,000 mg total) by mouth 2 (two) times daily with a meal. 180 tablet 3  . simvastatin (ZOCOR) 20 MG tablet Take 20 mg by mouth at bedtime.      No current facility-administered medications for this visit.    Allergies:  Bee venom; Ceclor [cefaclor]; and Shellfish allergy   Social History: The patient  reports that he has never smoked. He has never used smokeless tobacco. He reports that he drinks about 0.6 oz of alcohol per week . He reports that he does not use drugs.   ROS:  Please see the history of present illness. Otherwise, complete review of systems is positive for NYHA class II-III dyspnea.  All other systems are reviewed and negative.   Physical Exam: VS:  BP 104/66   Pulse 77   Ht '5\' 9"'$  (1.753 m)   Wt 192 lb (87.1 kg)   SpO2 96%   BMI 28.35 kg/m , BMI Body mass index is 28.35 kg/m.  Wt Readings from Last 3 Encounters:  05/20/16 192 lb (87.1 kg)  05/09/16 192 lb 12.8 oz (87.5 kg)  03/09/16 195 lb 9.6 oz (88.7 kg)  General: Patient appears comfortable at rest. HEENT: Conjunctiva and lids normal, oropharynx clear. Neck: Supple, no elevated JVP or carotid bruits, no thyromegaly. Lungs: Clear to auscultation, nonlabored breathing at rest. Cardiac: Regular rate and rhythm, no S3, soft systolic murmur, no pericardial rub. Abdomen: Soft, nontender, bowel sounds present, no guarding or rebound. Extremities: No pitting edema, distal pulses 2+. Skin: Warm and dry. Musculoskeletal: No kyphosis. Neuropsychiatric: Alert and oriented x3, affect grossly appropriate.  Recent Labwork: 11/30/2015: ALT 18; AST 13; BUN 33; Creat 1.16; Hemoglobin 16.1; Platelets 232; Potassium 4.1; Sodium 139; TSH 2.16      Component Value Date/Time   CHOL 140 11/30/2015 1030   TRIG 210 (H) 11/30/2015 1030   HDL 41 11/30/2015 1030   CHOLHDL 3.4 11/30/2015 1030   VLDL 42 (H) 11/30/2015 1030   LDLCALC 57 11/30/2015 1030    Other Studies Reviewed Today:  Echocardiogram 03/04/2016: Study Conclusions  - Left ventricle: The cavity size was normal. Wall thickness was   increased in a pattern of mild LVH. Systolic function was normal.   The estimated ejection fraction was in the range of 60% to 65%.   Wall motion was normal; there were no regional wall motion   abnormalities. Features are consistent with a pseudonormal left   ventricular filling pattern, with concomitant abnormal relaxation   and increased filling pressure (grade 2 diastolic dysfunction). - Aortic valve: Trileaflet; mildly calcified leaflets. There was   mild regurgitation. - Mitral valve: There was trivial regurgitation. - Right atrium: Central venous pressure (est): 3 mm Hg. - Tricuspid valve: There was trivial regurgitation. - Pulmonary arteries: PA peak pressure: 24 mm Hg (S). - Pericardium, extracardiac: There was no pericardial effusion.  Impressions:  - Mild LVH with LVEF 29-52%, grade 2 diastolic dysfunction. Trivial   mitral regurgitation. Mild aortic valve sclerosis with mild   aortic crepitation. Trivial tricuspid regurgitation with PASP 24   mmHg.  Cardiopulmonary stress test 05/06/2016: Conclusion: Exercise testing with gas exchange demonstrates a normal functional capacity when compared to matched sedentary norms. There is no indication for exercise-induced bronchospasm. There is no clear indication for cardiovascular limitation. Although he has normal exercise capacity, he may be primarily symptomatic due to ventilatory limitations. The pattern of no breathing reserve, excessive dead space ventilation (Elevated VE/VCO2 slope) may indicate subtle ILD or pulmonary vascular process. If a right and left heart cath is  performed we suggest measuring right heart pressures before and with exertion to check for exercise induced pulmonary hypertension. Pre-exercise restrictive patterns and exceeding of ventilatory limits at peak exercise may also be related to patient's body habitus, which is contributing to dyspnea.   Assessment and Plan:  1. Long-standing dyspnea on exertion, NYHA class 2-3, reported to be progressive. Noninvasive imaging has not revealed significant ischemic precipitant so far, he had a recent CPX that did not demonstrate obvious exercise-induced bronchospasm or cardiovascular limitation. There remains some concern based on Pulmonary follow-up that he may have exercise-induced pulmonary hypertension. His transthoracic echocardiogram revealed a PASP of 24 mmHg at baseline and moderate diastolic dysfunction. Left and right heart catheterization (with exercise) has been recommended. As noted above, he is not prepared to pursue this as yet, but is willing to discuss the matter further with [name], to whom he is being referred.  2. Type 2 diabetes mellitus, on Glucophage.  3. Hypothyroidism, on Synthroid. TSH normal.  4. Well-controlled hyperlipidemia, LDL 57 on Zocor.  Current medicines were reviewed with the patient today.   Orders Placed  This Encounter  Procedures  . AMB referral to CHF clinic  . EKG 12-Lead    Disposition: Referral to [name] to discuss further workup.  Signed, Satira Sark, MD, [facility] 05/20/2016 2:40 PM    [facility] at [facility] [address] [place], [place] [address] Phone: [phone]; Fax: [phone]

## 2016-05-20 NOTE — Patient Instructions (Signed)
You have been referred to Dr Haroldine Laws in [place] office   Your physician recommends that you continue on your current medications as directed. Please refer to the Current Medication list given to you today.     Thank you for choosing [facility] !

## 2016-06-27 ENCOUNTER — Ambulatory Visit (HOSPITAL_COMMUNITY)
Admission: RE | Admit: 2016-06-27 | Discharge: 2016-06-27 | Disposition: A | Discharge status: Home / Self Care | Payer: Medicare Other | Source: Ambulatory Visit | Attending: Internal Medicine | Admitting: Internal Medicine

## 2016-06-27 ENCOUNTER — Encounter (HOSPITAL_COMMUNITY): Payer: Self-pay | Admitting: Internal Medicine

## 2016-06-27 ENCOUNTER — Encounter (HOSPITAL_COMMUNITY): Payer: Self-pay | Admitting: *Deleted

## 2016-06-27 VITALS — BP 114/74 | HR 75 | Wt 190.8 lb

## 2016-06-27 DIAGNOSIS — E785 Hyperlipidemia, unspecified: Secondary | ICD-10-CM | POA: Insufficient documentation

## 2016-06-27 DIAGNOSIS — R942 Abnormal results of pulmonary function studies: Secondary | ICD-10-CM

## 2016-06-27 DIAGNOSIS — E119 Type 2 diabetes mellitus without complications: Secondary | ICD-10-CM | POA: Insufficient documentation

## 2016-06-27 DIAGNOSIS — Z7984 Long term (current) use of oral hypoglycemic drugs: Secondary | ICD-10-CM | POA: Insufficient documentation

## 2016-06-27 DIAGNOSIS — R200 Anesthesia of skin: Secondary | ICD-10-CM | POA: Insufficient documentation

## 2016-06-27 DIAGNOSIS — Z7982 Long term (current) use of aspirin: Secondary | ICD-10-CM | POA: Insufficient documentation

## 2016-06-27 DIAGNOSIS — R9439 Abnormal result of other cardiovascular function study: Secondary | ICD-10-CM

## 2016-06-27 DIAGNOSIS — R0600 Dyspnea, unspecified: Secondary | ICD-10-CM | POA: Insufficient documentation

## 2016-06-27 DIAGNOSIS — I700 Atherosclerosis of aorta: Secondary | ICD-10-CM | POA: Diagnosis not present

## 2016-06-27 DIAGNOSIS — E039 Hypothyroidism, unspecified: Secondary | ICD-10-CM | POA: Diagnosis not present

## 2016-06-27 DIAGNOSIS — R0689 Other abnormalities of breathing: Secondary | ICD-10-CM

## 2016-06-27 DIAGNOSIS — K449 Diaphragmatic hernia without obstruction or gangrene: Secondary | ICD-10-CM | POA: Diagnosis not present

## 2016-06-27 LAB — BASIC METABOLIC PANEL
Anion gap: 13 (ref 5–15)
BUN: 17 mg/dL (ref 6–20)
CHLORIDE: 104 mmol/L (ref 101–111)
CO2: 22 mmol/L (ref 22–32)
CREATININE: 1.04 mg/dL (ref 0.61–1.24)
Calcium: 9.7 mg/dL (ref 8.9–10.3)
GFR calc non Af Amer: >60 mL/min (ref >60)
GLUCOSE: 127 mg/dL — AB (ref 65–99)
Potassium: 4.3 mmol/L (ref 3.5–5.1)
Sodium: 139 mmol/L (ref 135–145)

## 2016-06-27 LAB — PROTIME-INR
INR: 1.04
Prothrombin Time: 13.7 seconds (ref 11.4–15.2)

## 2016-06-27 LAB — BRAIN NATRIURETIC PEPTIDE: B Natriuretic Peptide: 32.3 pg/mL (ref 0.0–100.0)

## 2016-06-27 NOTE — Progress Notes (Signed)
Advanced Heart Failure Clinic Note   Referring Physician: Dr. Domenic Polite Primary Care: Jenna Luo, MD Primary Cardiologist: Dr. Domenic Polite  HPI:  Caleb Butler is a 71 y.o. male with history of long-standing, progressive dyspnea on exertion (NYHA 2-3), Hypothyroidism, DM2, and HLD.  Referred to HF clinic for further work up of possible exercise induced pulmonary hypertension. CPX as below. Pt refused [facility] 01/2016.  He presents today to establish care in HF/PAH clinic. Main complaint is DOE. 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  Can walk about 1200 feet (to mailbox and back) at pace without much trouble.  Has DOE much worse when lifting something heavy or getting a hurry.  States it has been gradually worsening over the best 10 years, but markedly so over this past year.  Has been exposed to Toco and Asbestosis (Worked as an Clinical biochemist and in auto-body). Never smoker. No heavy ETOH or IV drug use. Occasional lightheadedness with rapid standing but not marked or limiting. Denies peripheral edema.  He states he has previously had very bad experiences with anesthesia, and this is his main concern.  After hernia repair was in ICU for several days with hypotension and after EGD had temporary paralysis in BLEs, has persistent paraesthesias in L leg.  Occasional atypical chest pain that he has contributed to indigestion.  Family History: Father had CHF (unclear etiology) and CKD (Passed at 92 yo) Mother passed of MI at 54 yo.  Social History: Lives with wife in [place]. Raises Livestock.  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  Echo 02/2016 LVEF 60-65%, Grade 2 DD, Mild AI, Trivial MR, Pa pear pressure 24 mm Hg. Normal RV.  High res Chest CT 03/13/2016 - No evidence of ILD. Mild patchy bibasilar scarring and volume loss. Incidental finding of AAA. + coronary artery calcification, + right adrenal myelolipoma.   PFTS 11/2015 FVC 2.75 (64%) FEV1 2.05 (65%) TLC  5.26 (77%) DLCO 20.70 (66%) -> corrects to 100% with alveolar volume.   CPX 05/06/16 Pre-Exercise PFTs  FVC 3.05 (74%)    FEV1 2.19 (70%)     FEV1/FVC 72 (94%)     MVV 86 (69%) Post-Exercise PFTs ((from lowest post-exercise trial (%change from rest)) FVC performed IPE, 5, 10, 15 mins FVC 3.19 (+6%) IPE    FEV1 2.44 (+11%)  10 mins     FEV1/FVC 73 (+1%)    Exercise Time:  8:30      Watts: 120 RPE: 19 Reason stopped: Patient ended test due to dyspnea (9/10) and leg fatigue Additional symptoms: Back pain (7-8/10) with weight on left leg Resting HR: 79 Peak HR: 135  (89% age predicted max HR) BP rest: 134/66 BP peak: 128/66 Peak VO2: 19.8 (86% predicted peak VO2) VE/VCO2 slope: 44 OUES: 1.46 Peak RER: 1.11 Ventilatory Threshold: 15.2 (66% predicted or measured peak VO2) Peak RR 53 Peak Ventilation: 88.8 VE/MVV: 103% PETCO2 at peak: 22 O2pulse: 12  (92% predicted O2pulse)  Review of Systems: [y] = yes, [ ]  = no   General: Weight gain [ ] ; Weight loss [ ] ; Anorexia [ ] ; Fatigue [ ] ; Fever [ ] ; Chills [ ] ; Weakness [ ]   Cardiac: Chest pain/pressure [ ] ; Resting SOB [ ] ; Exertional SOB [y]; Orthopnea [ ] ; Pedal Edema [ ] ; Palpitations [ ] ; Syncope [ ] ; Presyncope [ ] ; Paroxysmal nocturnal dyspnea[ ]   Pulmonary: Cough [ ] ; Wheezing[ ] ; Hemoptysis[ ] ; Sputum [ ] ; Snoring [ ]   GI: Vomiting[ ] ; Dysphagia[ ] ; Melena[ ] ; Hematochezia [ ] ; Heartburn[ ] ;  Abdominal pain [ ] ; Constipation [ ] ; Diarrhea [ ] ; BRBPR [ ]   GU: Hematuria[ ] ; Dysuria [ ] ; Nocturia[ ]   Vascular: Pain in legs with walking [ ] ; Pain in feet with lying flat [ ] ; Non-healing sores [ ] ; Stroke [ ] ; TIA [ ] ; Slurred speech [ ] ;  Neuro: Headaches[ ] ; Vertigo[ ] ; Seizures[ ] ; Paresthesias[ ] ;Blurred vision [ ] ; Diplopia [ ] ; Vision changes [ ]   Ortho/Skin: Arthritis [y]; Joint pain [y]; Muscle pain [y]; Joint swelling [ ] ; Back Pain [ ] ; Rash [ ]   Psych: Depression[ ] ; Anxiety[ ]   Heme: Bleeding  problems [ ] ; Clotting disorders [ ] ; Anemia [ ]   Endocrine: Diabetes [ ] ; Thyroid dysfunction[ ]    Past Medical History:  Diagnosis Date  . Hiatal hernia   . Hypothyroidism   . Type 2 diabetes mellitus ([place])     Current Outpatient Prescriptions  Medication Sig Dispense Refill  . aspirin 81 MG EC tablet Take by mouth.    . Cyanocobalamin (VITAMIN B 12) 100 MCG LOZG Take by mouth.    . Glucose Blood (BLOOD GLUCOSE TEST STRIPS) STRP     . glucose blood test strip     . levothyroxine (SYNTHROID, LEVOTHROID) 50 MCG tablet Take 50 mcg by mouth daily.  3  . metFORMIN (GLUCOPHAGE) 1000 MG tablet Take 1 tablet (1,000 mg total) by mouth 2 (two) times daily with a meal. 180 tablet 3  . simvastatin (ZOCOR) 20 MG tablet Take 20 mg by mouth at bedtime.      No current facility-administered medications for this encounter.     Allergies  Allergen Reactions  . Bee Venom   . Ceclor [Cefaclor] Hives  . Shellfish Allergy       Social History   Social History  . Marital status: Married    Spouse name: N/A  . Number of children: N/A  . Years of education: N/A   Occupational History  . Not on file.   Social History Main Topics  . Smoking status: Never Smoker  . Smokeless tobacco: Never Used  . Alcohol use 0.6 oz/week    1 Cans of beer per week     Comment: occ  . Drug use: No  . Sexual activity: Not on file   Other Topics Concern  . Not on file   Social History Narrative   Retired. Lives with wife, [name].      Family History  Problem Relation Age of Onset  . Diabetes Mellitus II Father   . Heart failure Father   . Kidney failure Father    Vitals:   06/27/16 1141  BP: 114/74  Pulse: 75  SpO2: 99%  Weight: 190 lb 12 oz (86.5 kg)    PHYSICAL EXAM: General:  Well appearing. NAD.  HEENT: Normal Neck: supple. JVD 6-7 cm. Carotids 2+ bilat; no bruits. No thyromegaly or nodule noted.  Cor: PMI nondisplaced. Regular rate & rhythm. No rubs, gallops or murmurs. Lungs:  CTAB, normal effort Abdomen: soft, NT, ND, no HSM. No bruits or masses. +BS  Extremities: no cyanosis, clubbing, rash, edema Neuro: alert & oriented x 3, cranial nerves grossly intact. moves all 4 extremities w/o difficulty. Affect pleasant.  ASSESSMENT & PLAN:  1. Progressive Dyspnea on exertion - High res Chest CT 10/17 with no evidence of ILD, but mild patchy bibasilar scarring and volume loss. +coronary calcifications - Echo 02/2016 LVEF 60-65%, Grade 2 DD, Mild AI, Trivial MR, Pa pear pressure 24 mm Hg. Normal RV.  -  Will need RHC. Has not had ischemic work up, but significant coronary calcification noted on High res chest, so would consider LHC at same time.  - Pt may benefit most from Exercise - RHC.  - Consider VQ scan to look for chronic PE.   Shirley Friar, PA-C 06/27/16   Patient seen and examined with Oda Kilts, PA-C. We discussed all aspects of the encounter. I agree with the assessment and plan as stated above.   Echo, CPX, PFTs and CT images reviewed personally. He has had progressive exertional dyspnea over the past 2 years. Echo and PFTs relatively normal. CPX test shows normal functional capacity but very high VE/VCO2 slope and ventilatory limitation which often suggests pulmonary vascular process. CT chest without ILD but significant coronary calcification particularly in LAD territory. Will proceed with VQ to exclude chronic PE followed by R/L cardiac cath. We also discussed possibility of exercise RHC if resting numbers are normal. However, data shows that over 50% of elderly patients with increase pulmonary pressures with exercise so data will have to be interpreted cautiously.   Shakita Keir,MD 6:04 PM

## 2016-06-27 NOTE — Patient Instructions (Signed)
VQ Scan has been ordered for you  R/L Heart Cath has been ordered for you on February 8th, please see attached letter  Follow up in 6 weeks

## 2016-06-28 ENCOUNTER — Other Ambulatory Visit (HOSPITAL_COMMUNITY): Payer: Self-pay | Admitting: *Deleted

## 2016-06-28 DIAGNOSIS — R0600 Dyspnea, unspecified: Secondary | ICD-10-CM

## 2016-06-28 DIAGNOSIS — R0689 Other abnormalities of breathing: Principal | ICD-10-CM

## 2016-06-30 HISTORY — PX: CARDIAC CATHETERIZATION: SHX172

## 2016-07-05 NOTE — Addendum Note (Signed)
Encounter addended by: Scarlette Calico, RN on: 07/05/2016  1:42 PM<BR>    Actions taken: Diagnosis association updated, Order list changed

## 2016-07-07 ENCOUNTER — Ambulatory Visit (HOSPITAL_COMMUNITY)
Admission: RE | Admit: 2016-07-07 | Discharge: 2016-07-07 | Disposition: A | Discharge status: Home / Self Care | Payer: Medicare Other | Source: Ambulatory Visit | Attending: Internal Medicine | Admitting: Internal Medicine

## 2016-07-07 ENCOUNTER — Encounter (HOSPITAL_COMMUNITY)
Admission: RE | Disposition: A | Discharge status: Home / Self Care | Payer: Self-pay | Source: Ambulatory Visit | Attending: Internal Medicine

## 2016-07-07 ENCOUNTER — Other Ambulatory Visit (HOSPITAL_COMMUNITY): Payer: Self-pay | Admitting: Cardiology

## 2016-07-07 DIAGNOSIS — I2510 Atherosclerotic heart disease of native coronary artery without angina pectoris: Secondary | ICD-10-CM | POA: Insufficient documentation

## 2016-07-07 DIAGNOSIS — Z7984 Long term (current) use of oral hypoglycemic drugs: Secondary | ICD-10-CM | POA: Diagnosis not present

## 2016-07-07 DIAGNOSIS — Z7982 Long term (current) use of aspirin: Secondary | ICD-10-CM | POA: Insufficient documentation

## 2016-07-07 DIAGNOSIS — E119 Type 2 diabetes mellitus without complications: Secondary | ICD-10-CM | POA: Diagnosis not present

## 2016-07-07 DIAGNOSIS — E039 Hypothyroidism, unspecified: Secondary | ICD-10-CM | POA: Diagnosis not present

## 2016-07-07 DIAGNOSIS — Z8249 Family history of ischemic heart disease and other diseases of the circulatory system: Secondary | ICD-10-CM | POA: Diagnosis not present

## 2016-07-07 DIAGNOSIS — R0609 Other forms of dyspnea: Secondary | ICD-10-CM

## 2016-07-07 DIAGNOSIS — R0600 Dyspnea, unspecified: Secondary | ICD-10-CM

## 2016-07-07 DIAGNOSIS — E785 Hyperlipidemia, unspecified: Secondary | ICD-10-CM | POA: Insufficient documentation

## 2016-07-07 DIAGNOSIS — R0689 Other abnormalities of breathing: Secondary | ICD-10-CM

## 2016-07-07 DIAGNOSIS — Z91013 Allergy to seafood: Secondary | ICD-10-CM | POA: Insufficient documentation

## 2016-07-07 HISTORY — PX: RIGHT/LEFT HEART CATH AND CORONARY ANGIOGRAPHY: CATH118266

## 2016-07-07 LAB — POCT I-STAT 3, VENOUS BLOOD GAS (G3P V)
ACID-BASE DEFICIT: 2 mmol/L (ref 0.0–2.0)
Acid-base deficit: 5 mmol/L — ABNORMAL HIGH (ref 0.0–2.0)
Bicarbonate: 21.2 mmol/L (ref 20.0–28.0)
Bicarbonate: 24.1 mmol/L (ref 20.0–28.0)
O2 SAT: 69 %
O2 Saturation: 68 %
PCO2 VEN: 44 mmHg (ref 44.0–60.0)
TCO2: 22 mmol/L (ref 0–100)
TCO2: 25 mmol/L (ref 0–100)
pCO2, Ven: 41.3 mmHg — ABNORMAL LOW (ref 44.0–60.0)
pH, Ven: 7.318 (ref 7.250–7.430)
pH, Ven: 7.347 (ref 7.250–7.430)
pO2, Ven: 38 mmHg (ref 32.0–45.0)
pO2, Ven: 39 mmHg (ref 32.0–45.0)

## 2016-07-07 LAB — POCT I-STAT 3, ART BLOOD GAS (G3+)
ACID-BASE DEFICIT: 1 mmol/L (ref 0.0–2.0)
BICARBONATE: 24.2 mmol/L (ref 20.0–28.0)
O2 Saturation: 95 %
TCO2: 25 mmol/L (ref 0–100)
pCO2 arterial: 40.7 mmHg (ref 32.0–48.0)
pH, Arterial: 7.382 (ref 7.350–7.450)
pO2, Arterial: 78 mmHg — ABNORMAL LOW (ref 83.0–108.0)

## 2016-07-07 LAB — CBC
HEMATOCRIT: 41.3 % (ref 39.0–52.0)
HEMOGLOBIN: 14.6 g/dL (ref 13.0–17.0)
MCH: 32 pg (ref 26.0–34.0)
MCHC: 35.4 g/dL (ref 30.0–36.0)
MCV: 90.6 fL (ref 78.0–100.0)
Platelets: 206 10*3/uL (ref 150–400)
RBC: 4.56 MIL/uL (ref 4.22–5.81)
RDW: 13.3 % (ref 11.5–15.5)
WBC: 6.3 10*3/uL (ref 4.0–10.5)

## 2016-07-07 LAB — GLUCOSE, CAPILLARY
GLUCOSE-CAPILLARY: 148 mg/dL — AB (ref 65–99)
GLUCOSE-CAPILLARY: 97 mg/dL (ref 65–99)

## 2016-07-07 SURGERY — RIGHT/LEFT HEART CATH AND CORONARY ANGIOGRAPHY
Anesthesia: LOCAL

## 2016-07-07 MED ORDER — MIDAZOLAM HCL 2 MG/2ML IJ SOLN
INTRAMUSCULAR | Status: DC | PRN
  Administered 2016-07-07: 2 mg via INTRAVENOUS

## 2016-07-07 MED ORDER — SODIUM CHLORIDE 0.9 % IV SOLN: 250.0000 mL | INTRAVENOUS | Status: DC | PRN

## 2016-07-07 MED ORDER — SODIUM CHLORIDE 0.9% FLUSH: 3.0000 mL | INTRAVENOUS | Status: DC | PRN

## 2016-07-07 MED ORDER — ASPIRIN 81 MG PO CHEW
CHEWABLE_TABLET | ORAL | Status: AC
  Administered 2016-07-07: 81 mg via ORAL
  Filled 2016-07-07: qty 1

## 2016-07-07 MED ORDER — MIDAZOLAM HCL 2 MG/2ML IJ SOLN
INTRAMUSCULAR | Status: AC
  Filled 2016-07-07: qty 2

## 2016-07-07 MED ORDER — HEPARIN (PORCINE) IN NACL 2-0.9 UNIT/ML-% IJ SOLN
INTRAMUSCULAR | Status: DC | PRN
  Administered 2016-07-07: 1000 mL

## 2016-07-07 MED ORDER — SODIUM CHLORIDE 0.9% FLUSH: 3.0000 mL | Freq: Two times a day (BID) | INTRAVENOUS | Status: DC

## 2016-07-07 MED ORDER — ACETAMINOPHEN 325 MG PO TABS: 650.0000 mg | ORAL_TABLET | ORAL | Status: DC | PRN

## 2016-07-07 MED ORDER — FENTANYL CITRATE (PF) 100 MCG/2ML IJ SOLN
INTRAMUSCULAR | Status: DC | PRN
  Administered 2016-07-07: 25 ug via INTRAVENOUS

## 2016-07-07 MED ORDER — HEPARIN SODIUM (PORCINE) 1000 UNIT/ML IJ SOLN
INTRAMUSCULAR | Status: DC | PRN
  Administered 2016-07-07: 4000 [IU] via INTRAVENOUS

## 2016-07-07 MED ORDER — SODIUM CHLORIDE 0.9 % IV SOLN
INTRAVENOUS | Status: DC
  Administered 2016-07-07: 12:00:00 via INTRAVENOUS

## 2016-07-07 MED ORDER — VERAPAMIL HCL 2.5 MG/ML IV SOLN
INTRAVENOUS | Status: DC | PRN
  Administered 2016-07-07: 10 mL via INTRA_ARTERIAL

## 2016-07-07 MED ORDER — VERAPAMIL HCL 2.5 MG/ML IV SOLN
INTRAVENOUS | Status: AC
  Filled 2016-07-07: qty 2

## 2016-07-07 MED ORDER — LIDOCAINE HCL (PF) 1 % IJ SOLN
INTRAMUSCULAR | Status: DC | PRN
  Administered 2016-07-07 (×2): 2 mL via INTRADERMAL

## 2016-07-07 MED ORDER — LIDOCAINE HCL (PF) 1 % IJ SOLN
INTRAMUSCULAR | Status: AC
  Filled 2016-07-07: qty 30

## 2016-07-07 MED ORDER — IOPAMIDOL (ISOVUE-370) INJECTION 76%
INTRAVENOUS | Status: DC | PRN
  Administered 2016-07-07: 85 mL via INTRA_ARTERIAL

## 2016-07-07 MED ORDER — ASPIRIN 81 MG PO CHEW
81.0000 mg | CHEWABLE_TABLET | ORAL | Status: AC
  Administered 2016-07-07: 81 mg via ORAL

## 2016-07-07 MED ORDER — IOPAMIDOL (ISOVUE-370) INJECTION 76%
INTRAVENOUS | Status: AC
  Filled 2016-07-07: qty 100

## 2016-07-07 MED ORDER — HEPARIN (PORCINE) IN NACL 2-0.9 UNIT/ML-% IJ SOLN
INTRAMUSCULAR | Status: AC
  Filled 2016-07-07: qty 1000

## 2016-07-07 MED ORDER — SODIUM CHLORIDE 0.9 % IV SOLN: INTRAVENOUS | Status: AC

## 2016-07-07 MED ORDER — ISOSORBIDE MONONITRATE ER 30 MG PO TB24: 30.0000 mg | ORAL_TABLET | Freq: Every day | ORAL | 3 refills | Status: DC

## 2016-07-07 MED ORDER — ONDANSETRON HCL 4 MG/2ML IJ SOLN: 4.0000 mg | Freq: Four times a day (QID) | INTRAMUSCULAR | Status: DC | PRN

## 2016-07-07 MED ORDER — HEPARIN SODIUM (PORCINE) 1000 UNIT/ML IJ SOLN
INTRAMUSCULAR | Status: AC
  Filled 2016-07-07: qty 1

## 2016-07-07 MED ORDER — FENTANYL CITRATE (PF) 100 MCG/2ML IJ SOLN
INTRAMUSCULAR | Status: AC
  Filled 2016-07-07: qty 2

## 2016-07-07 SURGICAL SUPPLY — 12 items
CATH BALLN WEDGE 5F 110CM (CATHETERS) ×2 IMPLANT
CATH IMPULSE 5F ANG/FL3.5 (CATHETERS) ×2 IMPLANT
DEVICE RAD COMP TR BAND LRG (VASCULAR PRODUCTS) ×2 IMPLANT
GLIDESHEATH SLEND SS 6F .021 (SHEATH) ×2 IMPLANT
GUIDEWIRE INQWIRE 1.5J.035X260 (WIRE) ×1 IMPLANT
INQWIRE 1.5J .035X260CM (WIRE) ×2
KIT HEART LEFT (KITS) ×2 IMPLANT
PACK CARDIAC CATHETERIZATION (CUSTOM PROCEDURE TRAY) ×2 IMPLANT
SHEATH FAST CATH BRACH 5F 5CM (SHEATH) ×2 IMPLANT
TRANSDUCER W/STOPCOCK (MISCELLANEOUS) ×2 IMPLANT
TUBING CIL FLEX 10 FLL-RA (TUBING) ×2 IMPLANT
WIRE HI TORQ VERSACORE-J 145CM (WIRE) ×2 IMPLANT

## 2016-07-07 NOTE — H&P (View-Only) (Signed)
Advanced Heart Failure Clinic Note   Referring Physician: Dr. Domenic Polite Primary Care: Jenna Luo, MD Primary Cardiologist: Dr. Domenic Polite  HPI:  Caleb Butler is a 71 y.o. male with history of long-standing, progressive dyspnea on exertion (NYHA 2-3), Hypothyroidism, DM2, and HLD.  Referred to HF clinic for further work up of possible exercise induced pulmonary hypertension. CPX as below. Pt refused [facility] 01/2016.  He presents today to establish care in HF/PAH clinic. Main complaint is DOE. 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  Can walk about 1200 feet (to mailbox and back) at pace without much trouble.  Has DOE much worse when lifting something heavy or getting a hurry.  States it has been gradually worsening over the best 10 years, but markedly so over this past year.  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 Never smoker. No heavy ETOH or IV drug use. Occasional lightheadedness with rapid standing but not marked or limiting. Denies peripheral edema.  He states he has previously had very bad experiences with anesthesia, and this is his main concern.  After hernia repair was in ICU for several days with hypotension and after EGD had temporary paralysis in BLEs, has persistent paraesthesias in L leg.  Occasional atypical chest pain that he has contributed to indigestion.  Family History: Father had CHF (unclear etiology) and CKD (Passed at 22 yo) Mother passed of MI at 71 yo.  Social History: Lives with wife in [place]. Raises Livestock.  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  Echo 02/2016 LVEF 60-65%, Grade 2 DD, Mild AI, Trivial MR, Pa pear pressure 24 mm Hg. Normal RV.  High res Chest CT 03/13/2016 - No evidence of ILD. Mild patchy bibasilar scarring and volume loss. Incidental finding of AAA. + coronary artery calcification, + right adrenal myelolipoma.   PFTS 11/2015 FVC 2.75 (64%) FEV1 2.05 (65%) TLC  5.26 (77%) DLCO 20.70 (66%) -> corrects to 100% with alveolar volume.   CPX 05/06/16 Pre-Exercise PFTs  FVC 3.05 (74%)    FEV1 2.19 (70%)     FEV1/FVC 72 (94%)     MVV 86 (69%) Post-Exercise PFTs ((from lowest post-exercise trial (%change from rest)) FVC performed IPE, 5, 10, 15 mins FVC 3.19 (+6%) IPE    FEV1 2.44 (+11%)  10 mins     FEV1/FVC 73 (+1%)    Exercise Time:  8:30      Watts: 120 RPE: 19 Reason stopped: Patient ended test due to dyspnea (9/10) and leg fatigue Additional symptoms: Back pain (7-8/10) with weight on left leg Resting HR: 79 Peak HR: 135  (89% age predicted max HR) BP rest: 134/66 BP peak: 128/66 Peak VO2: 19.8 (86% predicted peak VO2) VE/VCO2 slope: 44 OUES: 1.46 Peak RER: 1.11 Ventilatory Threshold: 15.2 (66% predicted or measured peak VO2) Peak RR 53 Peak Ventilation: 88.8 VE/MVV: 103% PETCO2 at peak: 22 O2pulse: 12  (92% predicted O2pulse)  Review of Systems: [y] = yes, [ ]  = no   General: Weight gain [ ] ; Weight loss [ ] ; Anorexia [ ] ; Fatigue [ ] ; Fever [ ] ; Chills [ ] ; Weakness [ ]   Cardiac: Chest pain/pressure [ ] ; Resting SOB [ ] ; Exertional SOB [y]; Orthopnea [ ] ; Pedal Edema [ ] ; Palpitations [ ] ; Syncope [ ] ; Presyncope [ ] ; Paroxysmal nocturnal dyspnea[ ]   Pulmonary: Cough [ ] ; Wheezing[ ] ; Hemoptysis[ ] ; Sputum [ ] ; Snoring [ ]   GI: Vomiting[ ] ; Dysphagia[ ] ; Melena[ ] ; Hematochezia [ ] ; Heartburn[ ] ;  Abdominal pain [ ] ; Constipation [ ] ; Diarrhea [ ] ; BRBPR [ ]   GU: Hematuria[ ] ; Dysuria [ ] ; Nocturia[ ]   Vascular: Pain in legs with walking [ ] ; Pain in feet with lying flat [ ] ; Non-healing sores [ ] ; Stroke [ ] ; TIA [ ] ; Slurred speech [ ] ;  Neuro: Headaches[ ] ; Vertigo[ ] ; Seizures[ ] ; Paresthesias[ ] ;Blurred vision [ ] ; Diplopia [ ] ; Vision changes [ ]   Ortho/Skin: Arthritis [y]; Joint pain [y]; Muscle pain [y]; Joint swelling [ ] ; Back Pain [ ] ; Rash [ ]   Psych: Depression[ ] ; Anxiety[ ]   Heme: Bleeding  problems [ ] ; Clotting disorders [ ] ; Anemia [ ]   Endocrine: Diabetes [ ] ; Thyroid dysfunction[ ]    Past Medical History:  Diagnosis Date  . Hiatal hernia   . Hypothyroidism   . Type 2 diabetes mellitus ([place])     Current Outpatient Prescriptions  Medication Sig Dispense Refill  . aspirin 81 MG EC tablet Take by mouth.    . Cyanocobalamin (VITAMIN B 12) 100 MCG LOZG Take by mouth.    . Glucose Blood (BLOOD GLUCOSE TEST STRIPS) STRP     . glucose blood test strip     . levothyroxine (SYNTHROID, LEVOTHROID) 50 MCG tablet Take 50 mcg by mouth daily.  3  . metFORMIN (GLUCOPHAGE) 1000 MG tablet Take 1 tablet (1,000 mg total) by mouth 2 (two) times daily with a meal. 180 tablet 3  . simvastatin (ZOCOR) 20 MG tablet Take 20 mg by mouth at bedtime.      No current facility-administered medications for this encounter.     Allergies  Allergen Reactions  . Bee Venom   . Ceclor [Cefaclor] Hives  . Shellfish Allergy       Social History   Social History  . Marital status: Married    Spouse name: N/A  . Number of children: N/A  . Years of education: N/A   Occupational History  . Not on file.   Social History Main Topics  . Smoking status: Never Smoker  . Smokeless tobacco: Never Used  . Alcohol use 0.6 oz/week    1 Cans of beer per week     Comment: occ  . Drug use: No  . Sexual activity: Not on file   Other Topics Concern  . Not on file   Social History Narrative   Retired. Lives with wife, [name].      Family History  Problem Relation Age of Onset  . Diabetes Mellitus II Father   . Heart failure Father   . Kidney failure Father    Vitals:   06/27/16 1141  BP: 114/74  Pulse: 75  SpO2: 99%  Weight: 190 lb 12 oz (86.5 kg)    PHYSICAL EXAM: General:  Well appearing. NAD.  HEENT: Normal Neck: supple. JVD 6-7 cm. Carotids 2+ bilat; no bruits. No thyromegaly or nodule noted.  Cor: PMI nondisplaced. Regular rate & rhythm. No rubs, gallops or murmurs. Lungs:  CTAB, normal effort Abdomen: soft, NT, ND, no HSM. No bruits or masses. +BS  Extremities: no cyanosis, clubbing, rash, edema Neuro: alert & oriented x 3, cranial nerves grossly intact. moves all 4 extremities w/o difficulty. Affect pleasant.  ASSESSMENT & PLAN:  1. Progressive Dyspnea on exertion - High res Chest CT 10/17 with no evidence of ILD, but mild patchy bibasilar scarring and volume loss. +coronary calcifications - Echo 02/2016 LVEF 60-65%, Grade 2 DD, Mild AI, Trivial MR, Pa pear pressure 24 mm Hg. Normal RV.  -  Will need RHC. Has not had ischemic work up, but significant coronary calcification noted on High res chest, so would consider LHC at same time.  - Pt may benefit most from Exercise - RHC.  - Consider VQ scan to look for chronic PE.   Shirley Friar, PA-C 06/27/16   Patient seen and examined with Oda Kilts, PA-C. We discussed all aspects of the encounter. I agree with the assessment and plan as stated above.   Echo, CPX, PFTs and CT images reviewed personally. He has had progressive exertional dyspnea over the past 2 years. Echo and PFTs relatively normal. CPX test shows normal functional capacity but very high VE/VCO2 slope and ventilatory limitation which often suggests pulmonary vascular process. CT chest without ILD but significant coronary calcification particularly in LAD territory. Will proceed with VQ to exclude chronic PE followed by R/L cardiac cath. We also discussed possibility of exercise RHC if resting numbers are normal. However, data shows that over 50% of elderly patients with increase pulmonary pressures with exercise so data will have to be interpreted cautiously.   Jerome Otter,MD 6:04 PM

## 2016-07-07 NOTE — Interval H&P Note (Signed)
History and Physical Interval Note:  07/07/2016 12:36 PM  Caleb Butler  has presented today for surgery, with the diagnosis of pulmonary hypertension  The various methods of treatment have been discussed with the patient and family. After consideration of risks, benefits and other options for treatment, the patient has consented to  Procedure(s): Right/Left Heart Cath and Coronary Angiography (N/A) and possible angioplasty as a surgical intervention .  The patient's history has been reviewed, patient examined, no change in status, stable for surgery.  I have reviewed the patient's chart and labs.  Questions were answered to the patient's satisfaction.     Bensimhon, Quillian Quince

## 2016-07-07 NOTE — Discharge Instructions (Signed)

## 2016-07-08 ENCOUNTER — Encounter (HOSPITAL_COMMUNITY): Payer: Self-pay | Admitting: Internal Medicine

## 2016-07-08 ENCOUNTER — Other Ambulatory Visit (HOSPITAL_COMMUNITY): Payer: Self-pay

## 2016-07-08 MED ORDER — ISOSORBIDE MONONITRATE ER 30 MG PO TB24: 30.0000 mg | ORAL_TABLET | Freq: Every day | ORAL | 3 refills | Status: DC

## 2016-07-09 ENCOUNTER — Other Ambulatory Visit: Payer: Self-pay | Admitting: Student

## 2016-07-11 ENCOUNTER — Encounter: Payer: Self-pay | Admitting: Internal Medicine

## 2016-07-11 ENCOUNTER — Ambulatory Visit (INDEPENDENT_AMBULATORY_CARE_PROVIDER_SITE_OTHER): Payer: Medicare Other | Admitting: Internal Medicine

## 2016-07-11 VITALS — BP 98/62 | HR 79 | Ht 70.0 in | Wt 192.0 lb

## 2016-07-11 DIAGNOSIS — R0689 Other abnormalities of breathing: Secondary | ICD-10-CM | POA: Diagnosis not present

## 2016-07-11 DIAGNOSIS — I208 Other forms of angina pectoris: Secondary | ICD-10-CM | POA: Diagnosis not present

## 2016-07-11 DIAGNOSIS — R0600 Dyspnea, unspecified: Secondary | ICD-10-CM | POA: Diagnosis not present

## 2016-07-11 NOTE — Progress Notes (Signed)
Subjective:     Patient ID: Caleb Butler, male   DOB: 06-Aug-1945, 71 y.o.   MRN: [id number]  HPI   PCP [facility] TOM, MD  HPI   IOV 03/09/2016  Chief Complaint  Patient presents with  . Pulmonary Consult    Pt referred by Dr. Dennard Schaumann for abnormal PFT. Pt c/o DOE x 15 years - worsened over the past years. Pt denies cough, orthopnea, swelling, CP/tightness and f/c/s.    71 year old gentleman who lives in [place] for the last 10 years. He used to live in [place]. He was a [place] War veteran between 1969 and 43 and exposed to agent orange. According to his history he was exposed to the most agent orange. He reports insidious onset of shortness of breath for the last 10-15 years. It is progressive especially in the last 5 years. 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 This is significantly worse than a year or 2 ago. It is not associated with chest pain or orthopnea or paroxysmal nocturnal dyspnea or coughing or chest tightness. He is not sure what wheezing but apparently his wife thinks that he might be wheezing. She confirmed this to me. Although it is not clear when he wheezes. Dyspnea is rated as mild to moderate. Brought on by exertion and definitely relieved by rest.  Pertinent tests that is below  Pulmonary function test 12/07/2015 shows FVC 2.75 L/64%, FEV1 2.05 L/65% and a ratio of 75 near normal/100%. No postbronchodilator response in FVC but there was 150 ML postbronchodilator response in FEV1 to 2.20 L/69% with a ratio of 81. Total lung capacity 5.6/77 consistent with restriction. DLCO 20.7/66% with reduced diffusion capacity. Overall pulmonary function test raises the possibility of intrinsic lung disease of a restrictive nature   CT chest with contrast 02/04/2016: No mediastinal adenopathy. Reported as clear lung fields but then this  is not a high-resolution CT chest. I personally think after visualizing the film that might be some basal ILD findings are subtle. He does have coronary atherosclerosis  Echocardiogram 03/04/2016 shows grade 2 diastolic dysfunction with normal left ventricle systolic ejection fraction and normal pulmonary artery systolic pressures  Blood work 11/30/2015 shows normal creatinine 1.16 mg percent with a GFR of 74. Hemoglobin elevated at 16.1 g percent.\\\   He did see cardiologist Dr. Domenic Polite on 02/24/2016. I visualized the note and reviewed it. Given progressive nature of symptoms and previous negative cardiac stress test in 2015 Dr. Domenic Polite wants to perform a right and left heart catheterization. However patient prefers noninvasive approach and wants pulmonary etiologies ruled out and therefore he is here.  Walking desaturation test 185 feet 3 laps on room air 03/09/2016 - dpulse ox statyed at 96% . HR 64 -> 87    Exhaled nitric oxide today 03/09/2016 - 21ppb and normal   has a past medical history of Hiatal hernia; Hypothyroidism; and Type 2 diabetes mellitus ([place]).   reports that he has never smoked. He has never used smokeless tobacco.    OV 07/11/2016  Chief Complaint  Patient presents with  . Follow-up    Pt here after seeing cards for L/R heart cath. Pt states his breathing is doing well. Pt denies cough and CP/tightness.     Dyspnea followup   Had CPST -> then 07/07/16 had RHC and LHC -> main finding is 90%  ostial Cx lesion and ramus 90% lesion and D1 80%. Rx meidcal Rx for now and later complex angioplasty. He has diabetes on medication. Has visceral obesity   Review of Systems     Objective:   Physical Exam  Vitals:   07/11/16 1143  BP: 98/62  Pulse: 79  SpO2: 96%  Weight: 192 lb (87.1 kg)  Height: 5\' 10"  (1.778 m)    Estimated body mass index is 27.55 kg/m as calculated from the following:   Height as of this encounter: 5\' 10"  (1.778 m).   Weight as of  this encounter: 192 lb (87.1 kg).  clear lugs on exam Visceral obesity + Normal heart sounds + No edema     Assessment:       ICD-9-CM ICD-10-CM   1. Dyspnea and respiratory abnormality 786.09 R06.00     R06.89   2. Anginal equivalent (HCC) 413.9 I20.8        Plan:      I think you have anginal equivalent s reason for shortness of breath  Plan  per cardiology  weight/visceral fat reduction can help reduce risk  - take example of low glycemic diet sheet but follow it only after discussion with PCP [facility] TOM, MD if appropriate for you  Followup 9 months or sooner if needed  (> 50% of this 15 min visit spent in face to face counseling or/and coordination of care)   Dr. Brand Males, M.D., [facility].C.P Pulmonary and Critical Care Medicine Staff Physician [facility] Pager: [phone], If no answer or between  15:00h - 7:00h: call [phone]  07/11/2016 4:27 PM

## 2016-07-11 NOTE — Patient Instructions (Addendum)
ICD-9-CM ICD-10-CM   1. Dyspnea and respiratory abnormality 786.09 R06.00     R06.89   2. Anginal equivalent (HCC) 413.9 I20.8     I think you have anginal equivalent s reason for shortness of breath  Plan  per cardiology  weight/visceral fat reduction can help reduce risk  - take example of low glycemic diet sheet but follow it only after discussion with PCP [facility] TOM, MD if appropriate for you  Followup 9 months or sooner if needed

## 2016-07-13 ENCOUNTER — Ambulatory Visit (HOSPITAL_COMMUNITY): Payer: Medicare Other

## 2016-08-09 ENCOUNTER — Ambulatory Visit (HOSPITAL_COMMUNITY)
Admission: RE | Admit: 2016-08-09 | Discharge: 2016-08-09 | Disposition: A | Discharge status: Home / Self Care | Payer: Medicare Other | Source: Ambulatory Visit | Attending: Internal Medicine | Admitting: Internal Medicine

## 2016-08-09 VITALS — BP 104/62 | HR 86 | Wt 191.5 lb

## 2016-08-09 DIAGNOSIS — Z7984 Long term (current) use of oral hypoglycemic drugs: Secondary | ICD-10-CM | POA: Insufficient documentation

## 2016-08-09 DIAGNOSIS — E785 Hyperlipidemia, unspecified: Secondary | ICD-10-CM | POA: Diagnosis not present

## 2016-08-09 DIAGNOSIS — R0609 Other forms of dyspnea: Secondary | ICD-10-CM | POA: Diagnosis present

## 2016-08-09 DIAGNOSIS — E119 Type 2 diabetes mellitus without complications: Secondary | ICD-10-CM | POA: Insufficient documentation

## 2016-08-09 DIAGNOSIS — I2510 Atherosclerotic heart disease of native coronary artery without angina pectoris: Secondary | ICD-10-CM | POA: Diagnosis not present

## 2016-08-09 DIAGNOSIS — R0600 Dyspnea, unspecified: Secondary | ICD-10-CM | POA: Diagnosis not present

## 2016-08-09 DIAGNOSIS — R0689 Other abnormalities of breathing: Secondary | ICD-10-CM

## 2016-08-09 DIAGNOSIS — E039 Hypothyroidism, unspecified: Secondary | ICD-10-CM | POA: Diagnosis not present

## 2016-08-09 DIAGNOSIS — K449 Diaphragmatic hernia without obstruction or gangrene: Secondary | ICD-10-CM | POA: Insufficient documentation

## 2016-08-09 DIAGNOSIS — Z7982 Long term (current) use of aspirin: Secondary | ICD-10-CM | POA: Insufficient documentation

## 2016-08-09 DIAGNOSIS — I208 Other forms of angina pectoris: Secondary | ICD-10-CM | POA: Insufficient documentation

## 2016-08-09 MED ORDER — ROSUVASTATIN CALCIUM 20 MG PO TABS: 20.0000 mg | ORAL_TABLET | Freq: Every day | ORAL | 6 refills | Status: DC

## 2016-08-09 NOTE — Progress Notes (Signed)
Advanced Heart Failure Clinic Note   Referring Physician: Dr. Domenic Polite Primary Care: Jenna Luo, MD Primary Cardiologist: Dr. Domenic Polite  HPI:  Caleb Butler is a 71 y.o. male with history of long-standing, progressive dyspnea on exertion (NYHA 2-3), Hypothyroidism, DM2, and HLD.  Referred to HF clinic for further work up of possible exercise induced pulmonary hypertension. CPX as below which showed combined cardiac and pulmonary limitation. CT chest with no significant ILD.   We saw him for the first time in January 2018 for ongoing exertional dyspnea. Underwent R/L cath on 2/8 as below. Found to have significant ostial LCX and ramus  lesion. Not easily amenable to PCI. Started on Imdur 30 and referred for Myoview to assess ischemic burden but has not scheduled. Exertional dyspnea improving on Imdur. 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 No CP. Dyspnea improving. Feels good. Dizzy if gets up too fast.   Cardiac cath 07/07/16   The left ventricular ejection fraction is 55-65% by visual estimate.  Prox RCA lesion, 40 %stenosed.  Ost Cx lesion, 90 %stenosed.  Ramus lesion, 90 %stenosed.  LM lesion, 20 %stenosed.  Mid LAD lesion, 20 %stenosed.  1st Diag lesion, 80 %stenosed.   Findings:  RA = 5 RV = 23/8 PA = 27/4 (16) PCW = 3 Fick cardiac output/index = 5.2/2.6 PVR = 2.5 WU  Ao sat =95% PA sat = 68%, 69%  Assessment: 1. 1-V CAD at ostium of LCX also involving ostium of ramus 2. Aneursymal proximal LAD with mild plaque 3. 40% Proximal RCA 4. Normal LVEF with normal hemodynamics  Family History: Father had CHF (unclear etiology) and CKD (Passed at 23 yo) Mother passed of MI at 28 yo.  Social History: Lives with wife in [place]. Raises Livestock.  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  Echo 02/2016 LVEF 60-65%, Grade 2 DD, Mild AI, Trivial MR, Pa pear pressure 24 mm Hg. Normal RV.  High res Chest CT  03/13/2016 - No evidence of ILD. Mild patchy bibasilar scarring and volume loss. Incidental finding of AAA. + coronary artery calcification, + right adrenal myelolipoma.   PFTS 11/2015 FVC 2.75 (64%) FEV1 2.05 (65%) TLC 5.26 (77%) DLCO 20.70 (66%) -> corrects to 100% with alveolar volume.   CPX 05/06/16 Pre-Exercise PFTs  FVC 3.05 (74%)    FEV1 2.19 (70%)     FEV1/FVC 72 (94%)     MVV 86 (69%) Post-Exercise PFTs ((from lowest post-exercise trial (%change from rest)) FVC performed IPE, 5, 10, 15 mins FVC 3.19 (+6%) IPE    FEV1 2.44 (+11%)  10 mins     FEV1/FVC 73 (+1%)    Exercise Time:  8:30      Watts: 120 RPE: 19 Reason stopped: Patient ended test due to dyspnea (9/10) and leg fatigue Additional symptoms: Back pain (7-8/10) with weight on left leg Resting HR: 79 Peak HR: 135  (89% age predicted max HR) BP rest: 134/66 BP peak: 128/66 Peak VO2: 19.8 (86% predicted peak VO2) VE/VCO2 slope: 44 OUES: 1.46 Peak RER: 1.11 Ventilatory Threshold: 15.2 (66% predicted or measured peak VO2) Peak RR 53 Peak Ventilation: 88.8 VE/MVV: 103% PETCO2 at peak: 22 O2pulse: 12  (92% predicted O2pulse)   Past Medical History:  Diagnosis Date  . Hiatal hernia   . Hypothyroidism   . Type 2 diabetes mellitus ([place])     Current Outpatient Prescriptions  Medication Sig Dispense Refill  . aspirin 81 MG EC tablet Take 81 mg by mouth at bedtime.     Marland Kitchen  Cyanocobalamin (VITAMIN B 12) 100 MCG LOZG Take 100 mcg by mouth daily.     . Glucose Blood (BLOOD GLUCOSE TEST STRIPS) STRP     . glucose blood test strip     . isosorbide mononitrate (IMDUR) 30 MG 24 hr tablet Take 1 tablet (30 mg total) by mouth daily. 90 tablet 3  . levothyroxine (SYNTHROID, LEVOTHROID) 50 MCG tablet Take 50 mcg by mouth daily.  3  . lidocaine-prilocaine (EMLA) cream Apply 1 application topically as needed (foot pain).     Marland Kitchen loperamide (IMODIUM) 2 MG capsule Take 2 mg by mouth as needed for  diarrhea or loose stools.    . metFORMIN (GLUCOPHAGE) 1000 MG tablet Take 1 tablet (1,000 mg total) by mouth 2 (two) times daily with a meal. 180 tablet 3  . Multiple Vitamin (MULTIVITAMIN WITH MINERALS) TABS tablet Take 1 tablet by mouth daily.    . simvastatin (ZOCOR) 20 MG tablet Take 20 mg by mouth at bedtime.      No current facility-administered medications for this encounter.     Allergies  Allergen Reactions  . Bee Venom Swelling  . Ceclor [Cefaclor] Hives  . Shellfish Allergy Hives    Pt states it is questionable// he eats seafood still without problem.      Social History   Social History  . Marital status: Married    Spouse name: N/A  . Number of children: N/A  . Years of education: N/A   Occupational History  . Not on file.   Social History Main Topics  . Smoking status: Never Smoker  . Smokeless tobacco: Never Used  . Alcohol use 0.6 oz/week    1 Cans of beer per week     Comment: occ  . Drug use: No  . Sexual activity: Not on file   Other Topics Concern  . Not on file   Social History Narrative   Retired. Lives with wife, Caleb Butler.      Family History  Problem Relation Age of Onset  . Diabetes Mellitus II Father   . Heart failure Father   . Kidney failure Father    Vitals:   08/09/16 1103  BP: 104/62  Pulse: 86  SpO2: 94%  Weight: 191 lb 8 oz (86.9 kg)    PHYSICAL EXAM: General:  Well appearing. NAD.  HEENT: Normal Neck: supple. JVD 6 cm. Carotids 2+ bilat; no bruits. No thyromegaly or nodule noted.  Cor: PMI nondisplaced. Regular rate & rhythm. No rubs, gallops or murmurs. Lungs: CTAB, normal effort Abdomen: soft, NT, ND, no HSM. No bruits or masses. +BS  Extremities: no cyanosis, clubbing, rash, edema Neuro: alert & oriented x 3, cranial nerves grossly intact. moves all 4 extremities w/o difficulty. Affect pleasant.  ASSESSMENT & PLAN:  1. Dyspnea on exertion - CPX suggests mixed cardiac and pulmonary limitation - Given results of  cath and response to imdur suspect dyspnea most likely anginal equivalent. See belo for further discussion - High res Chest CT 10/17 with no evidence of ILD, but mild patchy bibasilar scarring and volume loss. +coronary calcifications - Echo 02/2016 LVEF 60-65%, Grade 2 DD, Mild AI, Trivial MR, Pa pear pressure 24 mm Hg. Normal RV.   2. CAD with chronic stable angina - cath images reviewed with him and his wife today in clinic. - he has 80% ostial LCx and Ramus lesion. Not easily amenable to PCI.  - we discussed options of medical therapy vs high-risk PCI.  - he feels symptoms  are improving. Will continue Imdur and ASA. Switch simva to crestor 20. BP too low to add b-blocker. Consider Plavix - refer to cardiac rehab at [facility] - If symptoms progressing can refer for PCI  Total time spent 40 minutes. Over half that time spent discussing above.    Glori Bickers, MD 08/09/16

## 2016-08-09 NOTE — Patient Instructions (Signed)
Stop Simvastatin  Start Crestor 20 mg daily  You have been referred to Pulmonary Rehab at [facility], they will contact you to schedule  We will contact you in 4 months to schedule your next appointment.

## 2016-08-18 ENCOUNTER — Ambulatory Visit (INDEPENDENT_AMBULATORY_CARE_PROVIDER_SITE_OTHER): Payer: Medicare Other | Admitting: Family Medicine

## 2016-08-18 ENCOUNTER — Encounter: Payer: Self-pay | Admitting: Family Medicine

## 2016-08-18 VITALS — BP 100/68 | HR 80 | Temp 97.7°F | Resp 18 | Ht 69.5 in | Wt 193.0 lb

## 2016-08-18 DIAGNOSIS — E038 Other specified hypothyroidism: Secondary | ICD-10-CM

## 2016-08-18 DIAGNOSIS — I2510 Atherosclerotic heart disease of native coronary artery without angina pectoris: Secondary | ICD-10-CM | POA: Diagnosis not present

## 2016-08-18 DIAGNOSIS — E119 Type 2 diabetes mellitus without complications: Secondary | ICD-10-CM | POA: Diagnosis not present

## 2016-08-18 NOTE — Progress Notes (Signed)
Subjective:    Patient ID: Caleb Butler, male    DOB: 1946/01/21, 71 y.o.   MRN: [id number]  HPI7/3/17 He is here to establish care.  PMH is significant for DMII, hypothyroidism, HLD.  He recently started prednisone for sciatica under the care of orthopedist.  Colonosocpy was 2013.  Overdue for prostate exam, hep c test, pneumovax, tdap, and zostavax.  He has no concerns.  AT that time, my plan was: We will check HgA1c and urine microalbumin.  Check LDL and goal is less than 100.  CHeck TSh.  Screen for hep c.  Check PSA.  Colonosocopy up to date.  Receved Tdap today.  Defers pneumovax and Zostavax until next visit.  01/18/16 Patient reports progressive shortness of breath and dyspnea on exertion. He states this is been going on for last 15 years steadily worsening. He reports having had a nuclear stress test from his previous cardiologist that was reportedly normal 2 years ago. He denies any echocardiogram. He denies any orthopnea or paroxysmal nocturnal dyspnea. However he gets easily winded just walking outside and feet his horses. He does have a history of working around large quantities of his spasticity for many years growing up a lot of exposure to agent orange in [place]. Patient was recently referred to a pulmonologist who performed pulmonary function tests which are listed in [place]. Prebronchodilator FEV1 to FVC ratio 75%. Postbronchodilator FEV1 to FVC ratio was 82% both of which are normal. However he had diminished lung volumes of 65% in both FEV1 and FVC. This suggests possible interstitial lung disease. I see no chest x-ray or CT scans performed of his lungs.  At that time, my plan was: CBC in July revealed no evidence of anemia. I will obtain previous workup performed by his cardiologist. If necessary, I'll perform an echocardiogram to evaluate for systolic or diastolic heart failure however his symptoms do not sound compatible with this. Rather I am concerned about interstitial  lung disease given his diminished lung volumes. I will obtain a CT scan of the chest to evaluate further and will likely consult pulmonology for evaluation.  08/18/16 Patient saw a pulmonologist and pulmonary workup was unremarkable aside from some scar tissue which was minimal. Was referred to cardiology. Catheterization revealed ostial stenosis in the left circumflex that was not amenable to PCI as well as a lesion in the ramus. He is being managed medically with Imdur for, Crestor, etc. He is currently taking metformin for diabetes. Patient states that his blood sugars are all less than 160. He denies any hypoglycemia. He is starting an exercise program. He denies any chest pain. Shortness of breath is stable. He denies any polyuria, polydipsia, or blurry vision. He denies any myalgias or right upper quadrant pain on Crestor. He did believe that he saw some memory loss on simvastatin. He switch to Crestor probably one week ago. At the present time he denies any side effects from Crestor. Past Medical History:  Diagnosis Date  . Hiatal hernia   . Hypothyroidism   . Type 2 diabetes mellitus ([place])    Past Surgical History:  Procedure Laterality Date  . Arm surgery Right   . FINGER SURGERY     Left right finger reattached.   Marland Kitchen RIGHT/LEFT HEART CATH AND CORONARY ANGIOGRAPHY N/A 07/07/2016   Procedure: Right/Left Heart Cath and Coronary Angiography;  Surgeon: Jolaine Artist, MD;  Location: [facility];  Service: Cardiovascular;  Laterality: N/A;   Current Outpatient Prescriptions on File  Prior to Visit  Medication Sig Dispense Refill  . aspirin 81 MG EC tablet Take 81 mg by mouth at bedtime.     . Cyanocobalamin (VITAMIN B 12) 100 MCG LOZG Take 100 mcg by mouth daily.     . Glucose Blood (BLOOD GLUCOSE TEST STRIPS) STRP     . glucose blood test strip     . isosorbide mononitrate (IMDUR) 30 MG 24 hr tablet Take 1 tablet (30 mg total) by mouth daily. 90 tablet 3  . metFORMIN (GLUCOPHAGE)  1000 MG tablet Take 1 tablet (1,000 mg total) by mouth 2 (two) times daily with a meal. 180 tablet 3  . Multiple Vitamin (MULTIVITAMIN WITH MINERALS) TABS tablet Take 1 tablet by mouth daily.    . rosuvastatin (CRESTOR) 20 MG tablet Take 1 tablet (20 mg total) by mouth daily. 30 tablet 6   No current facility-administered medications on file prior to visit.    Allergies  Allergen Reactions  . Bee Venom Swelling  . Ceclor [Cefaclor] Hives  . Shellfish Allergy Hives    Pt states it is questionable// he eats seafood still without problem.   Social History   Social History  . Marital status: Married    Spouse name: N/A  . Number of children: N/A  . Years of education: N/A   Occupational History  . Not on file.   Social History Main Topics  . Smoking status: Never Smoker  . Smokeless tobacco: Never Used  . Alcohol use 0.6 oz/week    1 Cans of beer per week     Comment: occ  . Drug use: No  . Sexual activity: Not on file   Other Topics Concern  . Not on file   Social History Narrative   Retired. Lives with wife, [name].    Family History  Problem Relation Age of Onset  . Diabetes Mellitus II Father   . Heart failure Father   . Kidney failure Father       Review of Systems  All other systems reviewed and are negative.      Objective:   Physical Exam  Constitutional: He is oriented to person, place, and time. He appears well-developed and well-nourished. No distress.  HENT:  Head: Normocephalic and atraumatic.  Right Ear: External ear normal.  Left Ear: External ear normal.  Nose: Nose normal.  Mouth/Throat: Oropharynx is clear and moist. No oropharyngeal exudate.  Eyes: Conjunctivae and EOM are normal. Pupils are equal, round, and reactive to light. Right eye exhibits no discharge. Left eye exhibits no discharge. No scleral icterus.  Neck: Normal range of motion. Neck supple. No JVD present. No tracheal deviation present. No thyromegaly present.    Cardiovascular: Normal rate, regular rhythm, normal heart sounds and intact distal pulses.  Exam reveals no gallop and no friction rub.   No murmur heard. Pulmonary/Chest: Effort normal and breath sounds normal. No stridor. No respiratory distress. He has no wheezes. He has no rales. He exhibits no tenderness.  Abdominal: Soft. Bowel sounds are normal. He exhibits no distension and no mass. There is no tenderness. There is no rebound and no guarding.  Genitourinary: Rectum normal and prostate normal.  Musculoskeletal: Normal range of motion. He exhibits no edema or tenderness.  Lymphadenopathy:    He has no cervical adenopathy.  Neurological: He is alert and oriented to person, place, and time. He has normal reflexes. No cranial nerve deficit. He exhibits normal muscle tone. Coordination normal.  Skin: Skin is warm. No  rash noted. He is not diaphoretic. No erythema. No pallor.  Psychiatric: He has a normal mood and affect. His behavior is normal. Judgment and thought content normal.  Vitals reviewed.         Assessment & Plan:  Controlled type 2 diabetes mellitus without complication, without long-term current use of insulin (HCC) - Plan: COMPLETE METABOLIC PANEL WITH GFR, Microalbumin, urine, Hemoglobin A1c, TSH  Other specified hypothyroidism  ASCVD (arteriosclerotic cardiovascular disease)  Blood pressure is controlled. We'll check a TSH TO ENSURE APPROPRIATE DOSE OF LEVOTHYROXINE.   Given the fact the patient  Has known coronary artery disease, I would switch from metformin to jardiance given the data on CV mortality reduction.  He would be an excellent patient for this.  Check hemoglobin A1c and if appropriate, DC metformin and replace with jardiance 25 mg poqday.

## 2016-08-19 ENCOUNTER — Telehealth: Payer: Self-pay | Admitting: Family Medicine

## 2016-08-19 LAB — HEMOGLOBIN A1C
HEMOGLOBIN A1C: 6.4 % — AB (ref <5.7)
MEAN PLASMA GLUCOSE: 137 mg/dL

## 2016-08-19 LAB — MICROALBUMIN, URINE: Microalb, Ur: 5.9 mg/dL

## 2016-08-19 LAB — COMPLETE METABOLIC PANEL WITH GFR
ALK PHOS: 48 U/L (ref 40–115)
ALT: 16 U/L (ref 9–46)
AST: 18 U/L (ref 10–35)
Albumin: 4.3 g/dL (ref 3.6–5.1)
BILIRUBIN TOTAL: 0.5 mg/dL (ref 0.2–1.2)
BUN: 20 mg/dL (ref 7–25)
CO2: 25 mmol/L (ref 20–31)
Calcium: 9.4 mg/dL (ref 8.6–10.3)
Chloride: 105 mmol/L (ref 98–110)
Creat: 1.06 mg/dL (ref 0.70–1.18)
GFR, EST NON AFRICAN AMERICAN: 71 mL/min (ref >=60)
GFR, Est African American: 82 mL/min (ref >=60)
Glucose, Bld: 92 mg/dL (ref 70–99)
Potassium: 4.5 mmol/L (ref 3.5–5.3)
Sodium: 141 mmol/L (ref 135–146)
Total Protein: 6.5 g/dL (ref 6.1–8.1)

## 2016-08-19 LAB — TSH: TSH: 3.91 mIU/L (ref 0.40–4.50)

## 2016-08-19 MED ORDER — EMPAGLIFLOZIN 25 MG PO TABS: 25.0000 mg | ORAL_TABLET | Freq: Every day | ORAL | 5 refills | Status: DC

## 2016-08-19 NOTE — Telephone Encounter (Signed)
Pt called with lab results.  Is interested in [facility].  Sent Rx to pharmacy.  Will see if PA needed.  Told pt if/when start Jardiance stop metformin.

## 2016-08-19 NOTE — Telephone Encounter (Signed)
-----   Message from Susy Frizzle, MD sent at 08/19/2016  7:01 AM EDT ----- Labs are excellent.  If interested, I would change metformin to jardiance 25 mg poqday because of heart disease.

## 2016-08-27 NOTE — Addendum Note (Signed)
Encounter addended by: Jolaine Artist, MD on: 08/27/2016 11:36 PM<BR>    Actions taken: LOS modified

## 2016-09-06 ENCOUNTER — Encounter (HOSPITAL_COMMUNITY): Payer: Self-pay

## 2016-09-06 ENCOUNTER — Encounter (HOSPITAL_COMMUNITY)
Admission: RE | Admit: 2016-09-06 | Discharge: 2016-09-06 | Disposition: A | Discharge status: Home / Self Care | Payer: Medicare Other | Source: Ambulatory Visit | Attending: Internal Medicine | Admitting: Internal Medicine

## 2016-09-06 VITALS — BP 100/74 | HR 83 | Ht 69.0 in | Wt 189.4 lb

## 2016-09-06 DIAGNOSIS — R0600 Dyspnea, unspecified: Secondary | ICD-10-CM

## 2016-09-06 DIAGNOSIS — E119 Type 2 diabetes mellitus without complications: Secondary | ICD-10-CM | POA: Diagnosis not present

## 2016-09-06 DIAGNOSIS — E039 Hypothyroidism, unspecified: Secondary | ICD-10-CM | POA: Diagnosis not present

## 2016-09-06 DIAGNOSIS — R0689 Other abnormalities of breathing: Secondary | ICD-10-CM

## 2016-09-06 NOTE — Progress Notes (Signed)
Cardiac/Pulmonary Rehab Medication Review by a Pharmacist  Does the patient  feel that his/her medications are working for him/her?  yes  Has the patient been experiencing any side effects to the medications prescribed?  yes  Does the patient measure his/her own blood pressure or blood glucose at home?  yes   Does the patient have any problems obtaining medications due to transportation or finances?   no  Understanding of regimen: good Understanding of indications: good Potential of compliance: excellent  Questions asked to Determine Patient Understanding of Medication Regimen:  1. What is the name of the medication?  2. What is the medication used for?  3. When should it be taken?  4. How much should be taken?  5. How will you take it?  6. What side effects should you report?  Understanding Defined as: Excellent: All questions above are correct Good: Questions 1-4 are correct Fair: Questions 1-2 are correct  Poor: 1 or none of the above questions are correct   Pharmacist comments: Pt does c/o some right leg and groin pain and right shoulder pain.  May be associated with Crestor since didn't start until medication started.   No other side effects noted.  Pt does check sugar at home but admits he doesn't do it every day.  Pt states BP is typically good when checked at MD office.    Hart Robinsons A 09/06/2016 12:40 PM

## 2016-09-06 NOTE — Progress Notes (Signed)
Pulmonary Individual Treatment Plan  Patient Details  Name: Caleb Butler MRN: [id number] Date of Birth: 06/07/1945 Referring Provider:     PULMONARY REHAB OTHER RESP ORIENTATION from 09/06/2016 in [facility]  Referring Provider  [name]      Initial Encounter Date:    [place] from 09/06/2016 in [facility]  Date  09/06/16  Referring Provider  [name]      Visit Diagnosis: Dyspnea and respiratory abnormalities  Patient's Home Medications on Admission:   Current Outpatient Prescriptions:  .  acetaminophen (TYLENOL) 325 MG tablet, Take 650 mg by mouth every 4 (four) hours as needed., Disp: , Rfl:  .  vitamin B-12 (CYANOCOBALAMIN) 1000 MCG tablet, Take 1,000 mcg by mouth daily., Disp: , Rfl:  .  aspirin 81 MG EC tablet, Take 81 mg by mouth at bedtime. , Disp: , Rfl:  .  empagliflozin (JARDIANCE) 25 MG TABS tablet, Take 25 mg by mouth daily., Disp: 30 tablet, Rfl: 5 .  Glucose Blood (BLOOD GLUCOSE TEST STRIPS) STRP, , Disp: , Rfl:  .  glucose blood test strip, , Disp: , Rfl:  .  isosorbide mononitrate (IMDUR) 30 MG 24 hr tablet, Take 1 tablet (30 mg total) by mouth daily., Disp: 90 tablet, Rfl: 3 .  levothyroxine (SYNTHROID, LEVOTHROID) 100 MCG tablet, Take 100 mcg by mouth daily before breakfast., Disp: , Rfl:  .  Multiple Vitamin (MULTIVITAMIN WITH MINERALS) TABS tablet, Take 1 tablet by mouth daily., Disp: , Rfl:  .  rosuvastatin (CRESTOR) 20 MG tablet, Take 1 tablet (20 mg total) by mouth daily., Disp: 30 tablet, Rfl: 6  Past Medical History: Past Medical History:  Diagnosis Date  . Hiatal hernia   . Hypothyroidism   . Type 2 diabetes mellitus (HCC)     Tobacco Use: History  Smoking Status  . Never Smoker  Smokeless Tobacco  . Never Used    Labs: Recent Review Flowsheet Data    Labs for ITP Cardiac and Pulmonary Rehab Latest Ref Rng & Units 11/30/2015 07/07/2016 07/07/2016 07/07/2016  08/18/2016   Cholestrol 125 - 200 mg/dL 140 - - - -   LDLCALC <130 mg/dL 57 - - - -   HDL >=40 mg/dL 41 - - - -   Trlycerides <150 mg/dL 210(H) - - - -   Hemoglobin A1c <5.7 % 7.3(H) - - - 6.4(H)   PHART 7.350 - 7.450 - 7.382 - - -   PCO2ART 32.0 - 48.0 mmHg - 40.7 - - -   HCO3 20.0 - 28.0 mmol/L - 24.2 21.2 24.1 -   TCO2 0 - 100 mmol/L - 25 22 25  -   ACIDBASEDEF 0.0 - 2.0 mmol/L - 1.0 5.0(H) 2.0 -   O2SAT % - 95.0 68.0 69.0 -      Capillary Blood Glucose: Lab Results  Component Value Date   GLUCAP 97 07/07/2016   GLUCAP 148 (H) 07/07/2016     ADL UCSD:     Pulmonary Assessment Scores    Row Name 09/06/16 1336         ADL UCSD   ADL Phase Entry     SOB Score total 56     Rest 1     Walk 8     Stairs 4     Bath 1     Dress 2     Shop 2       CAT Score   CAT Score 22  mMRC Score   mMRC Score 3        Pulmonary Function Assessment:     Pulmonary Function Assessment - 09/06/16 1332      Pulmonary Function Tests   FVC% 63 %   FEV1% 69 %   FEV1/FVC Ratio 108   DLCO% 66 %     Initial Spirometry Results   FVC% 64 %   FEV1% 65 %   FEV1/FVC Ratio 75     Post Bronchodilator Spirometry Results   FVC% 63 %   FEV1% 69 %   FEV1/FVC Ratio 81     Breath   Bilateral Breath Sounds Clear   Shortness of Breath Yes  SOB with exertion      Exercise Target Goals: Date: 09/06/16  Exercise Program Goal: Individual exercise prescription set with THRR, safety & activity barriers. Participant demonstrates ability to understand and report RPE using BORG scale, to self-measure pulse accurately, and to acknowledge the importance of the exercise prescription.  Exercise Prescription Goal: Starting with aerobic activity 30 plus minutes a day, 3 days per week for initial exercise prescription. Provide home exercise prescription and guidelines that participant acknowledges understanding prior to discharge.  Activity Barriers & Risk Stratification:   6 Minute  Walk:     6 Minute Walk    Row Name 09/06/16 1152         6 Minute Walk   Phase Initial     Distance 1400 feet     Distance % Change 0 %     Walk Time 6 minutes     # of Rest Breaks 0     MPH 2.65     METS 3.03     RPE 9     Perceived Dyspnea  13     VO2 Peak 10.01     Symptoms No     Resting HR 83 bpm     Resting BP 100/74     Max Ex. HR 89 bpm     Max Ex. BP 114/80     2 Minute Post BP 102/76        Oxygen Initial Assessment:     Oxygen Initial Assessment - 09/06/16 1345      Home Oxygen   Home Oxygen Device None   Sleep Oxygen Prescription None   Home Exercise Oxygen Prescription None   Home at Rest Exercise Oxygen Prescription None     Initial 6 min Walk   Oxygen Used None     Program Oxygen Prescription   Program Oxygen Prescription None      Oxygen Re-Evaluation:   Oxygen Discharge (Final Oxygen Re-Evaluation):   Initial Exercise Prescription:     Initial Exercise Prescription - 09/06/16 1100      Date of Initial Exercise RX and Referring Provider   Date 09/06/16   Referring Provider [name]     Treadmill   MPH 2   Grade 0   Minutes 15   METs 2.5     Recumbant Elliptical   Level 1   RPM 37   Watts 37   Minutes 20   METs 2.2     Prescription Details   Frequency (times per week) 2   Duration Progress to 30 minutes of continuous aerobic without signs/symptoms of physical distress     Intensity   THRR 40-80% of Max Heartrate [phone]   Ratings of Perceived Exertion 11-13   Perceived Dyspnea 0-4     Progression   Progression Continue progressive  overload as per policy without signs/symptoms or physical distress.     Resistance Training   Training Prescription Yes   Weight 1   Reps 10-15      Perform Capillary Blood Glucose checks as needed.  Exercise Prescription Changes:   Exercise Comments:   Exercise Goals and Review:      Exercise Goals    Row Name 09/06/16 1345             Exercise Goals    Increase Physical Activity Yes       Intervention Provide advice, education, support and counseling about physical activity/exercise needs.;Develop an individualized exercise prescription for aerobic and resistive training based on initial evaluation findings, risk stratification, comorbidities and participant's personal goals.       Expected Outcomes Achievement of increased cardiorespiratory fitness and enhanced flexibility, muscular endurance and strength shown through measurements of functional capacity and personal statement of participant.       Increase Strength and Stamina Yes       Intervention Provide advice, education, support and counseling about physical activity/exercise needs.;Develop an individualized exercise prescription for aerobic and resistive training based on initial evaluation findings, risk stratification, comorbidities and participant's personal goals.       Expected Outcomes Achievement of increased cardiorespiratory fitness and enhanced flexibility, muscular endurance and strength shown through measurements of functional capacity and personal statement of participant.          Exercise Goals Re-Evaluation :   Discharge Exercise Prescription (Final Exercise Prescription Changes):   Nutrition:  Target Goals: Understanding of nutrition guidelines, daily intake of sodium 1500mg , cholesterol 200mg , calories 30% from fat and 7% or less from saturated fats, daily to have 5 or more servings of fruits and vegetables.  Biometrics:     Pre Biometrics - 09/06/16 1153      Pre Biometrics   Height 5\' 9"  (1.753 m)   Weight 189 lb 6 oz (85.9 kg)   Waist Circumference 38 inches   Hip Circumference 37.5 inches   Waist to Hip Ratio 1.01 %   BMI (Calculated) 28   Triceps Skinfold 9 mm   % Body Fat 24.6 %   Grip Strength 76.67 kg   Flexibility 11 in   Single Leg Stand 6 seconds       Nutrition Therapy Plan and Nutrition Goals:   Nutrition Discharge: Rate Your Plate  Scores:   Nutrition Goals Re-Evaluation:   Nutrition Goals Discharge (Final Nutrition Goals Re-Evaluation):   Psychosocial: Target Goals: Acknowledge presence or absence of significant depression and/or stress, maximize coping skills, provide positive support system. Participant is able to verbalize types and ability to use techniques and skills needed for reducing stress and depression.  Initial Review & Psychosocial Screening:     Initial Psych Review & Screening - 09/06/16 1351      Initial Review   Current issues with Current Depression  Patient feels his is depressed because he can not do any of his ADL without SOB.      Family Dynamics   Good Support System? Yes     Barriers   Psychosocial barriers to participate in program There are no identifiable barriers or psychosocial needs.     Screening Interventions   Interventions Encouraged to exercise      Quality of Life Scores:     Quality of Life - 09/06/16 1154      Quality of Life Scores   Health/Function Pre 18.83 %   Socioeconomic Pre 27.3 %  Psych/Spiritual Pre 23 %   Family Pre 21 %   GLOBAL Pre 21.05 %      PHQ-9: Recent Review Flowsheet Data    Depression screen [facility] 2/9 09/06/2016 08/18/2016 01/18/2016   Decreased Interest 1 0 0   Down, Depressed, Hopeless 1 0 0   PHQ - 2 Score 2 0 0   Altered sleeping 3 0 -   Tired, decreased energy 3 0 -   Change in appetite 2 0 -   Feeling bad or failure about yourself  0 0 -   Trouble concentrating 1 0 -   Moving slowly or fidgety/restless 1 0 -   Suicidal thoughts 0 0 -   PHQ-9 Score 12 0 -   Difficult doing work/chores Somewhat difficult Not difficult at all -     Interpretation of Total Score  Total Score Depression Severity:  1-4 = Minimal depression, 5-9 = Mild depression, 10-14 = Moderate depression, 15-19 = Moderately severe depression, 20-27 = Severe depression   Psychosocial Evaluation and Intervention:     Psychosocial Evaluation - 09/06/16  1352      Psychosocial Evaluation & Interventions   Interventions Stress management education;Relaxation education;Encouraged to exercise with the program and follow exercise prescription   Continue Psychosocial Services  Follow up required by staff      Psychosocial Re-Evaluation:   Psychosocial Discharge (Final Psychosocial Re-Evaluation):    Education: Education Goals: Education classes will be provided on a weekly basis, covering required topics. Participant will state understanding/return demonstration of topics presented.  Learning Barriers/Preferences:     Learning Barriers/Preferences - 09/06/16 1208      Learning Barriers/Preferences   Learning Barriers None   Learning Preferences Skilled Demonstration;Verbal Instruction;Individual Instruction;Group Instruction      Education Topics: How Lungs Work and Diseases: - Discuss the anatomy of the lungs and diseases that can affect the lungs, such as COPD.   Exercise: -Discuss the importance of exercise, FITT principles of exercise, normal and abnormal responses to exercise, and how to exercise safely.   Environmental Irritants: -Discuss types of environmental irritants and how to limit exposure to environmental irritants.   Meds/Inhalers and oxygen: - Discuss respiratory medications, definition of an inhaler and oxygen, and the proper way to use an inhaler and oxygen.   Energy Saving Techniques: - Discuss methods to conserve energy and decrease shortness of breath when performing activities of daily living.    Bronchial Hygiene / Breathing Techniques: - Discuss breathing mechanics, pursed-lip breathing technique,  proper posture, effective ways to clear airways, and other functional breathing techniques   Cleaning Equipment: - Provides group verbal and written instruction about the health risks of elevated stress, cause of high stress, and healthy ways to reduce stress.   Nutrition I: Fats: - Discuss the  types of cholesterol, what cholesterol does to the body, and how cholesterol levels can be controlled.   Nutrition II: Labels: -Discuss the different components of food labels and how to read food labels.   Respiratory Infections: - Discuss the signs and symptoms of respiratory infections, ways to prevent respiratory infections, and the importance of seeking medical treatment when having a respiratory infection.   Stress I: Signs and Symptoms: - Discuss the causes of stress, how stress may lead to anxiety and depression, and ways to limit stress.   Stress II: Relaxation: -Discuss relaxation techniques to limit stress.   Oxygen for Home/Travel: - Discuss how to prepare for travel when on oxygen and proper ways to transport and  store oxygen to ensure safety.   Knowledge Questionnaire Score:     Knowledge Questionnaire Score - 09/06/16 1330      Knowledge Questionnaire Score   Pre Score 9/14      Core Components/Risk Factors/Patient Goals at Admission:     Personal Goals and Risk Factors at Admission - 09/06/16 1346      Core Components/Risk Factors/Patient Goals on Admission    Weight Management Yes   Intervention Weight Management/Obesity: Establish reasonable short term and long term weight goals.   Admit Weight 189 lb (85.7 kg)   Goal Weight: Short Term 184 lb (83.5 kg)   Goal Weight: Long Term 179 lb (81.2 kg)   Expected Outcomes Short Term: Continue to assess and modify interventions until short term weight is achieved;Long Term: Adherence to nutrition and physical activity/exercise program aimed toward attainment of established weight goal   Personal Goal Other Yes   Personal Goal Breath better during activities   Intervention PR 2 x week and supplement exercise at home 3 x week. He plans to join silver sneakers at [facility] to further his exercise.    Expected Outcomes Achieve personal goals.       Core Components/Risk Factors/Patient Goals Review:    Core  Components/Risk Factors/Patient Goals at Discharge (Final Review):    ITP Comments:   Comments: Patient arrived for 1st visit/orientation/education at 10:00. Patient was referred to PR by [name] due to Dyspnea and respiratory abnormality (R06.00, R06.89)). During orientation advised patient on arrival and appointment times what to wear, what to do before, during and after exercise. Reviewed attendance and class policy. Talked about inclement weather and class consultation policy. Pt is scheduled to return Pulmonary Rehab on 09/08/16 at 10:45. Pt was advised to come to class 15 minutes before class starts. Patient was also given instructions on meeting with the dietician and attending the Family Structure classes. Pt is eager to get started. Patient participated in warm up stretches followed by light weights and resistant bands. Patient was then able to complete 6 minute walk test. Patient c/o right groin pain during est 3/10. He also c/o right shoulder pain 3/10. All pain subsided after resting for 10-15 minutes. Patient was measured for the equipment. Discussed equipment safety with patient. Took patient pre-anthropometric measurements. Patient finished visit at 12:20.

## 2016-09-08 ENCOUNTER — Encounter (HOSPITAL_COMMUNITY)
Admission: RE | Admit: 2016-09-08 | Discharge: 2016-09-08 | Disposition: A | Discharge status: Home / Self Care | Payer: Medicare Other | Source: Ambulatory Visit | Attending: Internal Medicine | Admitting: Internal Medicine

## 2016-09-08 DIAGNOSIS — R0600 Dyspnea, unspecified: Secondary | ICD-10-CM

## 2016-09-08 DIAGNOSIS — R0689 Other abnormalities of breathing: Principal | ICD-10-CM

## 2016-09-08 LAB — GLUCOSE, CAPILLARY: GLUCOSE-CAPILLARY: 205 mg/dL — AB (ref 65–99)

## 2016-09-08 NOTE — Progress Notes (Signed)
Daily Session Note  Patient Details  Name: TRACEY STEWART MRN: [id number] Date of Birth: 08-04-1945 Referring Provider:     PULMONARY REHAB OTHER RESP ORIENTATION from 09/06/2016 in [place]  Referring Provider  [name]      Encounter Date: 09/08/2016  Check In:     Session Check In - 09/08/16 1102      Check-In   Location [facility]   Staff Present Aundra Dubin, RN, BSN;Laquanna Veazey Luther Parody, BS, EP, Exercise Physiologist   Supervising physician immediately available to respond to emergencies See telemetry face sheet for immediately available MD   Medication changes reported     No   Fall or balance concerns reported    No   Warm-up and Cool-down Performed as group-led instruction   Resistance Training Performed Yes   VAD Patient? No     Pain Assessment   Currently in Pain? No/denies   Pain Score 0-No pain   Multiple Pain Sites No      Capillary Blood Glucose: Results for orders placed or performed during the hospital encounter of 09/08/16 (from the past 24 hour(s))  Glucose, capillary     Status: Abnormal   Collection Time: 09/08/16 10:39 AM  Result Value Ref Range   Glucose-Capillary 205 (H) 65 - 99 mg/dL      History  Smoking Status  . Never Smoker  Smokeless Tobacco  . Never Used    Goals Met:    Goals Unmet:  Not Applicable  Comments: Check out [place]   Dr. Sinda Du is Medical Director for [facility].

## 2016-09-13 ENCOUNTER — Encounter (HOSPITAL_COMMUNITY)
Admission: RE | Admit: 2016-09-13 | Discharge: 2016-09-13 | Disposition: A | Discharge status: Home / Self Care | Payer: Medicare Other | Source: Ambulatory Visit | Attending: Internal Medicine | Admitting: Internal Medicine

## 2016-09-13 DIAGNOSIS — R0600 Dyspnea, unspecified: Secondary | ICD-10-CM | POA: Diagnosis not present

## 2016-09-13 DIAGNOSIS — R0689 Other abnormalities of breathing: Principal | ICD-10-CM

## 2016-09-13 NOTE — Progress Notes (Signed)
Daily Session Note  Patient Details  Name: Caleb Butler MRN: [id number] Date of Birth: [birth year] Referring Provider:     PULMONARY REHAB OTHER RESP ORIENTATION from 09/06/2016 in [place]  Referring Provider  [name]      Encounter Date: 09/13/2016  Check In:     Session Check In - 09/13/16 1058      Check-In   Location [facility]   Staff Present Suzanne Boron, BS, EP, Exercise Physiologist   Supervising physician immediately available to respond to emergencies See telemetry face sheet for immediately available MD   Medication changes reported     No   Fall or balance concerns reported    No   Warm-up and Cool-down Performed as group-led instruction   Resistance Training Performed Yes   VAD Patient? No     Pain Assessment   Currently in Pain? No/denies   Pain Score 0-No pain   Multiple Pain Sites No      Capillary Blood Glucose: No results found for this or any previous visit (from the past 24 hour(s)).    History  Smoking Status  . Never Smoker  Smokeless Tobacco  . Never Used    Goals Met:  Independence with exercise equipment Improved SOB with ADL's Using PLB without cueing & demonstrates good technique Exercise tolerated well No report of cardiac concerns or symptoms Strength training completed today  Goals Unmet:  Not Applicable  Comments: Check out 1145   Dr. Sinda Du is Medical Director for [facility].

## 2016-09-15 ENCOUNTER — Encounter (HOSPITAL_COMMUNITY)
Admission: RE | Admit: 2016-09-15 | Discharge: 2016-09-15 | Disposition: A | Discharge status: Home / Self Care | Payer: Medicare Other | Source: Ambulatory Visit | Attending: Internal Medicine | Admitting: Internal Medicine

## 2016-09-15 DIAGNOSIS — R0689 Other abnormalities of breathing: Principal | ICD-10-CM

## 2016-09-15 DIAGNOSIS — R0600 Dyspnea, unspecified: Secondary | ICD-10-CM

## 2016-09-15 NOTE — Progress Notes (Signed)
Daily Session Note  Patient Details  Name: DONTRAIL BLACKWELL MRN: [id number] Date of Birth: [birth year] Referring Provider:     PULMONARY REHAB OTHER RESP ORIENTATION from 09/06/2016 in [place]  Referring Provider  [name]      Encounter Date: 09/15/2016  Check In:     Session Check In - 09/15/16 1115      Check-In   Location [facility]   Staff Present Aundra Dubin, RN, BSN;Ciaran Begay Luther Parody, BS, EP, Exercise Physiologist   Supervising physician immediately available to respond to emergencies See telemetry face sheet for immediately available MD   Medication changes reported     No   Fall or balance concerns reported    No   Warm-up and Cool-down Performed as group-led instruction   Resistance Training Performed Yes   VAD Patient? No     Pain Assessment   Currently in Pain? No/denies   Pain Score 0-No pain   Multiple Pain Sites No      Capillary Blood Glucose: No results found for this or any previous visit (from the past 24 hour(s)).    History  Smoking Status  . Never Smoker  Smokeless Tobacco  . Never Used    Goals Met:  Independence with exercise equipment Improved SOB with ADL's Using PLB without cueing & demonstrates good technique Exercise tolerated well No report of cardiac concerns or symptoms Strength training completed today  Goals Unmet:  None  Comments: Check out 1145   Dr. Sinda Du is Medical Director for [facility].

## 2016-09-16 ENCOUNTER — Telehealth: Payer: Self-pay | Admitting: Family Medicine

## 2016-09-16 MED ORDER — GLUCOSE BLOOD VI STRP: ORAL_STRIP | 5 refills | Status: DC

## 2016-09-16 NOTE — Telephone Encounter (Signed)
Pt needs glucose test strips sent to [facility] in [place].

## 2016-09-16 NOTE — Telephone Encounter (Signed)
Sent to pharm ....

## 2016-09-20 ENCOUNTER — Encounter (HOSPITAL_COMMUNITY)
Admission: RE | Admit: 2016-09-20 | Discharge: 2016-09-20 | Disposition: A | Discharge status: Home / Self Care | Payer: Medicare Other | Source: Ambulatory Visit | Attending: Internal Medicine | Admitting: Internal Medicine

## 2016-09-20 DIAGNOSIS — R0600 Dyspnea, unspecified: Secondary | ICD-10-CM | POA: Diagnosis not present

## 2016-09-20 DIAGNOSIS — R0689 Other abnormalities of breathing: Principal | ICD-10-CM

## 2016-09-20 NOTE — Progress Notes (Signed)
Daily Session Note  Patient Details  Name: Caleb Butler MRN: [id number] Date of Birth: 1945/06/22 Referring Provider:     PULMONARY REHAB OTHER RESP ORIENTATION from 09/06/2016 in [place]  Referring Provider  [name]      Encounter Date: 09/20/2016  Check In:     Session Check In - 09/20/16 1115      Check-In   Location [facility]   Staff Present Russella Dar, MS, EP, [facility], Exercise Physiologist;Zorawar Strollo Luther Parody, BS, EP, Exercise Physiologist   Supervising physician immediately available to respond to emergencies See telemetry face sheet for immediately available MD   Medication changes reported     No   Fall or balance concerns reported    No   Warm-up and Cool-down Performed as group-led instruction   Resistance Training Performed Yes   VAD Patient? No     Pain Assessment   Currently in Pain? No/denies   Pain Score 0-No pain   Multiple Pain Sites No      Capillary Blood Glucose: No results found for this or any previous visit (from the past 24 hour(s)).      Exercise Prescription Changes - 09/19/16 1400      Response to Exercise   Blood Pressure (Admit) 110/60   Blood Pressure (Exercise) 134/72   Blood Pressure (Exit) 100/64   Heart Rate (Admit) 86 bpm   Heart Rate (Exercise) 98 bpm   Heart Rate (Exit) 79 bpm   Oxygen Saturation (Admit) 94 %   Oxygen Saturation (Exercise) 96 %   Oxygen Saturation (Exit) 96 %   Rating of Perceived Exertion (Exercise) 11   Perceived Dyspnea (Exercise) 11   Duration Progress to 30 minutes of  aerobic without signs/symptoms of physical distress   Intensity THRR unchanged     Progression   Progression Continue to progress workloads to maintain intensity without signs/symptoms of physical distress.     Resistance Training   Training Prescription Yes   Weight 4   Reps 10-15     Treadmill   MPH 2.5   Grade 0   Minutes 15   METs 4.06     Recumbant Elliptical   Level 2   RPM  49   Watts 69   Minutes 20   METs 3.5     Home Exercise Plan   Plans to continue exercise at Home (comment)   Frequency Add 2 additional days to program exercise sessions.      History  Smoking Status  . Never Smoker  Smokeless Tobacco  . Never Used    Goals Met:  Independence with exercise equipment Improved SOB with ADL's Using PLB without cueing & demonstrates good technique Exercise tolerated well No report of cardiac concerns or symptoms Strength training completed today  Goals Unmet:  Not Applicable  Comments: Check out 1145   Dr. Sinda Du is Medical Director for [facility].

## 2016-09-22 ENCOUNTER — Encounter (HOSPITAL_COMMUNITY)
Admission: RE | Admit: 2016-09-22 | Discharge: 2016-09-22 | Disposition: A | Discharge status: Home / Self Care | Payer: Medicare Other | Source: Ambulatory Visit | Attending: Internal Medicine | Admitting: Internal Medicine

## 2016-09-22 DIAGNOSIS — R0600 Dyspnea, unspecified: Secondary | ICD-10-CM | POA: Diagnosis not present

## 2016-09-22 DIAGNOSIS — R0689 Other abnormalities of breathing: Principal | ICD-10-CM

## 2016-09-22 NOTE — Progress Notes (Signed)
Pulmonary Individual Treatment Plan  Patient Details  Name: Caleb Butler MRN: [id number] Date of Birth: [birth year] Referring Provider:     PULMONARY REHAB OTHER RESP ORIENTATION from 09/06/2016 in [place]  Referring Provider  [name]      Initial Encounter Date:    [place] from 09/06/2016 in [place]  Date  09/06/16  Referring Provider  [name]      Visit Diagnosis: Dyspnea and respiratory abnormalities  Patient's Home Medications on Admission:   Current Outpatient Prescriptions:  .  acetaminophen (TYLENOL) 325 MG tablet, Take 650 mg by mouth every 4 (four) hours as needed., Disp: , Rfl:  .  aspirin 81 MG EC tablet, Take 81 mg by mouth at bedtime. , Disp: , Rfl:  .  empagliflozin (JARDIANCE) 25 MG TABS tablet, Take 25 mg by mouth daily., Disp: 30 tablet, Rfl: 5 .  Glucose Blood (BLOOD GLUCOSE TEST STRIPS) STRP, , Disp: , Rfl:  .  glucose blood test strip, Check BS BID Dx: E11.9, Disp: 100 each, Rfl: 5 .  isosorbide mononitrate (IMDUR) 30 MG 24 hr tablet, Take 1 tablet (30 mg total) by mouth daily., Disp: 90 tablet, Rfl: 3 .  levothyroxine (SYNTHROID, LEVOTHROID) 100 MCG tablet, Take 100 mcg by mouth daily before breakfast., Disp: , Rfl:  .  Multiple Vitamin (MULTIVITAMIN WITH MINERALS) TABS tablet, Take 1 tablet by mouth daily., Disp: , Rfl:  .  rosuvastatin (CRESTOR) 20 MG tablet, Take 1 tablet (20 mg total) by mouth daily., Disp: 30 tablet, Rfl: 6 .  vitamin B-12 (CYANOCOBALAMIN) 1000 MCG tablet, Take 1,000 mcg by mouth daily., Disp: , Rfl:   Past Medical History: Past Medical History:  Diagnosis Date  . Hiatal hernia   . Hypothyroidism   . Type 2 diabetes mellitus (HCC)     Tobacco Use: History  Smoking Status  . Never Smoker  Smokeless Tobacco  . Never Used    Labs: Recent Review Flowsheet Data    Labs for ITP Cardiac and Pulmonary Rehab Latest Ref Rng & Units  11/30/2015 07/07/2016 07/07/2016 07/07/2016 08/18/2016   Cholestrol 125 - 200 mg/dL 140 - - - -   LDLCALC <130 mg/dL 57 - - - -   HDL >=40 mg/dL 41 - - - -   Trlycerides <150 mg/dL 210(H) - - - -   Hemoglobin A1c <5.7 % 7.3(H) - - - 6.4(H)   PHART 7.350 - 7.450 - 7.382 - - -   PCO2ART 32.0 - 48.0 mmHg - 40.7 - - -   HCO3 20.0 - 28.0 mmol/L - 24.2 21.2 24.1 -   TCO2 0 - 100 mmol/L - 25 22 25  -   ACIDBASEDEF 0.0 - 2.0 mmol/L - 1.0 5.0(H) 2.0 -   O2SAT % - 95.0 68.0 69.0 -      Capillary Blood Glucose: Lab Results  Component Value Date   GLUCAP 205 (H) 09/08/2016   GLUCAP 97 07/07/2016   GLUCAP 148 (H) 07/07/2016     ADL UCSD:     Pulmonary Assessment Scores    Row Name 09/06/16 1336         ADL UCSD   ADL Phase Entry     SOB Score total 56     Rest 1     Walk 8     Stairs 4     Bath 1     Dress 2     Shop 2  CAT Score   CAT Score 22       mMRC Score   mMRC Score 3        Pulmonary Function Assessment:     Pulmonary Function Assessment - 09/06/16 1332      Pulmonary Function Tests   FVC% 63 %   FEV1% 69 %   FEV1/FVC Ratio 108   DLCO% 66 %     Initial Spirometry Results   FVC% 64 %   FEV1% 65 %   FEV1/FVC Ratio 75     Post Bronchodilator Spirometry Results   FVC% 63 %   FEV1% 69 %   FEV1/FVC Ratio 81     Breath   Bilateral Breath Sounds Clear   Shortness of Breath Yes  SOB with exertion      Exercise Target Goals:    Exercise Program Goal: Individual exercise prescription set with THRR, safety & activity barriers. Participant demonstrates ability to understand and report RPE using BORG scale, to self-measure pulse accurately, and to acknowledge the importance of the exercise prescription.  Exercise Prescription Goal: Starting with aerobic activity 30 plus minutes a day, 3 days per week for initial exercise prescription. Provide home exercise prescription and guidelines that participant acknowledges understanding prior to  discharge.  Activity Barriers & Risk Stratification:   6 Minute Walk:     6 Minute Walk    Row Name 09/06/16 1152         6 Minute Walk   Phase Initial     Distance 1400 feet     Distance % Change 0 %     Walk Time 6 minutes     # of Rest Breaks 0     MPH 2.65     METS 3.03     RPE 9     Perceived Dyspnea  13     VO2 Peak 10.01     Symptoms No     Resting HR 83 bpm     Resting BP 100/74     Max Ex. HR 89 bpm     Max Ex. BP 114/80     2 Minute Post BP 102/76        Oxygen Initial Assessment:     Oxygen Initial Assessment - 09/06/16 1345      Home Oxygen   Home Oxygen Device None   Sleep Oxygen Prescription None   Home Exercise Oxygen Prescription None   Home at Rest Exercise Oxygen Prescription None     Initial 6 min Walk   Oxygen Used None     Program Oxygen Prescription   Program Oxygen Prescription None      Oxygen Re-Evaluation:   Oxygen Discharge (Final Oxygen Re-Evaluation):   Initial Exercise Prescription:     Initial Exercise Prescription - 09/06/16 1100      Date of Initial Exercise RX and Referring Provider   Date 09/06/16   Referring Provider [name]     Treadmill   MPH 2   Grade 0   Minutes 15   METs 2.5     Recumbant Elliptical   Level 1   RPM 37   Watts 37   Minutes 20   METs 2.2     Prescription Details   Frequency (times per week) 2   Duration Progress to 30 minutes of continuous aerobic without signs/symptoms of physical distress     Intensity   THRR 40-80% of Max Heartrate [phone]   Ratings of Perceived Exertion 11-13  Perceived Dyspnea 0-4     Progression   Progression Continue progressive overload as per policy without signs/symptoms or physical distress.     Resistance Training   Training Prescription Yes   Weight 1   Reps 10-15      Perform Capillary Blood Glucose checks as needed.  Exercise Prescription Changes:      Exercise Prescription Changes    Row Name 09/19/16 1400              Response to Exercise   Blood Pressure (Admit) 110/60       Blood Pressure (Exercise) 134/72       Blood Pressure (Exit) 100/64       Heart Rate (Admit) 86 bpm       Heart Rate (Exercise) 98 bpm       Heart Rate (Exit) 79 bpm       Oxygen Saturation (Admit) 94 %       Oxygen Saturation (Exercise) 96 %       Oxygen Saturation (Exit) 96 %       Rating of Perceived Exertion (Exercise) 11       Perceived Dyspnea (Exercise) 11       Duration Progress to 30 minutes of  aerobic without signs/symptoms of physical distress       Intensity THRR unchanged         Progression   Progression Continue to progress workloads to maintain intensity without signs/symptoms of physical distress.         Resistance Training   Training Prescription Yes       Weight 4       Reps 10-15         Treadmill   MPH 2.5       Grade 0       Minutes 15       METs 4.06         Recumbant Elliptical   Level 2       RPM 49       Watts 69       Minutes 20       METs 3.5         Home Exercise Plan   Plans to continue exercise at Home (comment)       Frequency Add 2 additional days to program exercise sessions.          Exercise Comments:      Exercise Comments    Row Name 09/19/16 1427           Exercise Comments Patient is very determined and is progressing well in PR.           Exercise Goals and Review:      Exercise Goals    Row Name 09/06/16 1345             Exercise Goals   Increase Physical Activity Yes       Intervention Provide advice, education, support and counseling about physical activity/exercise needs.;Develop an individualized exercise prescription for aerobic and resistive training based on initial evaluation findings, risk stratification, comorbidities and participant's personal goals.       Expected Outcomes Achievement of increased cardiorespiratory fitness and enhanced flexibility, muscular endurance and strength shown through measurements of functional  capacity and personal statement of participant.       Increase Strength and Stamina Yes       Intervention Provide advice, education, support and counseling about physical activity/exercise needs.;Develop an individualized exercise prescription  for aerobic and resistive training based on initial evaluation findings, risk stratification, comorbidities and participant's personal goals.       Expected Outcomes Achievement of increased cardiorespiratory fitness and enhanced flexibility, muscular endurance and strength shown through measurements of functional capacity and personal statement of participant.          Exercise Goals Re-Evaluation :     Exercise Goals Re-Evaluation    Row Name 09/22/16 0937             Exercise Goal Re-Evaluation   Exercise Goals Review Increase Physical Activity;Increase Strenth and Stamina  Breathe better; get in shape.        Comments After completing 5 sessions, patient has had some progression. Will continue to monitor.        Expected Outcomes Patient will continue to work toward his goal to beathe better and get in shape.           Discharge Exercise Prescription (Final Exercise Prescription Changes):     Exercise Prescription Changes - 09/19/16 1400      Response to Exercise   Blood Pressure (Admit) 110/60   Blood Pressure (Exercise) 134/72   Blood Pressure (Exit) 100/64   Heart Rate (Admit) 86 bpm   Heart Rate (Exercise) 98 bpm   Heart Rate (Exit) 79 bpm   Oxygen Saturation (Admit) 94 %   Oxygen Saturation (Exercise) 96 %   Oxygen Saturation (Exit) 96 %   Rating of Perceived Exertion (Exercise) 11   Perceived Dyspnea (Exercise) 11   Duration Progress to 30 minutes of  aerobic without signs/symptoms of physical distress   Intensity THRR unchanged     Progression   Progression Continue to progress workloads to maintain intensity without signs/symptoms of physical distress.     Resistance Training   Training Prescription Yes   Weight 4    Reps 10-15     Treadmill   MPH 2.5   Grade 0   Minutes 15   METs 4.06     Recumbant Elliptical   Level 2   RPM 49   Watts 69   Minutes 20   METs 3.5     Home Exercise Plan   Plans to continue exercise at Home (comment)   Frequency Add 2 additional days to program exercise sessions.      Nutrition:  Target Goals: Understanding of nutrition guidelines, daily intake of sodium 1500mg , cholesterol 200mg , calories 30% from fat and 7% or less from saturated fats, daily to have 5 or more servings of fruits and vegetables.  Biometrics:     Pre Biometrics - 09/06/16 1153      Pre Biometrics   Height 5\' 9"  (1.753 m)   Weight 189 lb 6 oz (85.9 kg)   Waist Circumference 38 inches   Hip Circumference 37.5 inches   Waist to Hip Ratio 1.01 %   BMI (Calculated) 28   Triceps Skinfold 9 mm   % Body Fat 24.6 %   Grip Strength 76.67 kg   Flexibility 11 in   Single Leg Stand 6 seconds       Nutrition Therapy Plan and Nutrition Goals:   Nutrition Discharge: Rate Your Plate Scores:   Nutrition Goals Re-Evaluation:   Nutrition Goals Discharge (Final Nutrition Goals Re-Evaluation):   Psychosocial: Target Goals: Acknowledge presence or absence of significant depression and/or stress, maximize coping skills, provide positive support system. Participant is able to verbalize types and ability to use techniques and skills needed for reducing stress and  depression.  Initial Review & Psychosocial Screening:     Initial Psych Review & Screening - 09/06/16 1351      Initial Review   Current issues with Current Depression  Patient feels his is depressed because he can not do any of his ADL without SOB.      Family Dynamics   Good Support System? Yes     Barriers   Psychosocial barriers to participate in program There are no identifiable barriers or psychosocial needs.     Screening Interventions   Interventions Encouraged to exercise      Quality of Life Scores:      Quality of Life - 09/06/16 1154      Quality of Life Scores   Health/Function Pre 18.83 %   Socioeconomic Pre 27.3 %   Psych/Spiritual Pre 23 %   Family Pre 21 %   GLOBAL Pre 21.05 %      PHQ-9: Recent Review Flowsheet Data    Depression screen [facility] 2/9 09/06/2016 08/18/2016 01/18/2016   Decreased Interest 1 0 0   Down, Depressed, Hopeless 1 0 0   PHQ - 2 Score 2 0 0   Altered sleeping 3 0 -   Tired, decreased energy 3 0 -   Change in appetite 2 0 -   Feeling bad or failure about yourself  0 0 -   Trouble concentrating 1 0 -   Moving slowly or fidgety/restless 1 0 -   Suicidal thoughts 0 0 -   PHQ-9 Score 12 0 -   Difficult doing work/chores Somewhat difficult Not difficult at all -     Interpretation of Total Score  Total Score Depression Severity:  1-4 = Minimal depression, 5-9 = Mild depression, 10-14 = Moderate depression, 15-19 = Moderately severe depression, 20-27 = Severe depression   Psychosocial Evaluation and Intervention:     Psychosocial Evaluation - 09/06/16 1352      Psychosocial Evaluation & Interventions   Interventions Stress management education;Relaxation education;Encouraged to exercise with the program and follow exercise prescription   Continue Psychosocial Services  Follow up required by staff      Psychosocial Re-Evaluation:     Psychosocial Re-Evaluation    Row Name 09/22/16 [phone]             Psychosocial Re-Evaluation   Current issues with Current Depression       Comments Patient is still considering counseling.        Expected Outcomes Patient's PHQ-9 and QOL scores will improve at discharge.        Interventions Encouraged to attend Pulmonary Rehabilitation for the exercise       Continue Psychosocial Services  Follow up required by staff          Psychosocial Discharge (Final Psychosocial Re-Evaluation):     Psychosocial Re-Evaluation - 09/22/16 5732      Psychosocial Re-Evaluation   Current issues with Current Depression    Comments Patient is still considering counseling.    Expected Outcomes Patient's PHQ-9 and QOL scores will improve at discharge.    Interventions Encouraged to attend Pulmonary Rehabilitation for the exercise   Continue Psychosocial Services  Follow up required by staff       Education: Education Goals: Education classes will be provided on a weekly basis, covering required topics. Participant will state understanding/return demonstration of topics presented.  Learning Barriers/Preferences:     Learning Barriers/Preferences - 09/06/16 1208      Learning Barriers/Preferences   Learning Barriers None  Learning Preferences Skilled Demonstration;Verbal Instruction;Individual Instruction;Group Instruction      Education Topics: How Lungs Work and Diseases: - Discuss the anatomy of the lungs and diseases that can affect the lungs, such as COPD.   Exercise: -Discuss the importance of exercise, FITT principles of exercise, normal and abnormal responses to exercise, and how to exercise safely.   Environmental Irritants: -Discuss types of environmental irritants and how to limit exposure to environmental irritants.   Meds/Inhalers and oxygen: - Discuss respiratory medications, definition of an inhaler and oxygen, and the proper way to use an inhaler and oxygen.   Energy Saving Techniques: - Discuss methods to conserve energy and decrease shortness of breath when performing activities of daily living.    Bronchial Hygiene / Breathing Techniques: - Discuss breathing mechanics, pursed-lip breathing technique,  proper posture, effective ways to clear airways, and other functional breathing techniques   Cleaning Equipment: - Provides group verbal and written instruction about the health risks of elevated stress, cause of high stress, and healthy ways to reduce stress.   Nutrition I: Fats: - Discuss the types of cholesterol, what cholesterol does to the body, and how  cholesterol levels can be controlled.   Nutrition II: Labels: -Discuss the different components of food labels and how to read food labels.   Respiratory Infections: - Discuss the signs and symptoms of respiratory infections, ways to prevent respiratory infections, and the importance of seeking medical treatment when having a respiratory infection.   Stress I: Signs and Symptoms: - Discuss the causes of stress, how stress may lead to anxiety and depression, and ways to limit stress.   PULMONARY REHAB OTHER RESPIRATORY from 09/15/2016 in [place]  Date  09/08/16  Educator  DC  Instruction Review Code  2- meets goals/outcomes      Stress II: Relaxation: -Discuss relaxation techniques to limit stress.   PULMONARY REHAB OTHER RESPIRATORY from 09/15/2016 in [place]  Date  09/15/16  Educator  [place]  Instruction Review Code  2- meets goals/outcomes      Oxygen for Home/Travel: - Discuss how to prepare for travel when on oxygen and proper ways to transport and store oxygen to ensure safety.   Knowledge Questionnaire Score:     Knowledge Questionnaire Score - 09/06/16 1330      Knowledge Questionnaire Score   Pre Score 9/14      Core Components/Risk Factors/Patient Goals at Admission:     Personal Goals and Risk Factors at Admission - 09/06/16 1346      Core Components/Risk Factors/Patient Goals on Admission    Weight Management Yes   Intervention Weight Management/Obesity: Establish reasonable short term and long term weight goals.   Admit Weight 189 lb (85.7 kg)   Goal Weight: Short Term 184 lb (83.5 kg)   Goal Weight: Long Term 179 lb (81.2 kg)   Expected Outcomes Short Term: Continue to assess and modify interventions until short term weight is achieved;Long Term: Adherence to nutrition and physical activity/exercise program aimed toward attainment of established weight goal   Personal Goal Other Yes   Personal Goal  Breath better during activities   Intervention PR 2 x week and supplement exercise at home 3 x week. He plans to join silver sneakers at [facility] to further his exercise.    Expected Outcomes Achieve personal goals.       Core Components/Risk Factors/Patient Goals Review:      Goals and Risk Factor Review    Row Name  09/22/16 0936             Core Components/Risk Factors/Patient Goals Review   Personal Goals Review Weight Management/Obesity       Review Patient has completed 5 sessions maintaining his weight. Will continue to monitor for progress.        Expected Outcomes Patient will continue to work toward his weight loss goal.          Core Components/Risk Factors/Patient Goals at Discharge (Final Review):      Goals and Risk Factor Review - 09/22/16 0936      Core Components/Risk Factors/Patient Goals Review   Personal Goals Review Weight Management/Obesity   Review Patient has completed 5 sessions maintaining his weight. Will continue to monitor for progress.    Expected Outcomes Patient will continue to work toward his weight loss goal.      ITP Comments:   Comments: ITP 30 Day REVIEW Pt is making expected progress toward pulmonary rehab goals after completing 5 sessions. Recommend continued exercise, life style modification, education, and utilization of breathing techniques to increase stamina and strength and decrease shortness of breath with exertion.

## 2016-09-22 NOTE — Progress Notes (Signed)
Daily Session Note  Patient Details  Name: Caleb Butler MRN: [id number] Date of Birth: 30-Jul-1945 Referring Provider:     PULMONARY REHAB OTHER RESP ORIENTATION from 09/06/2016 in [place]  Referring Provider  [name]      Encounter Date: 09/22/2016  Check In:     Session Check In - 09/22/16 1357      Check-In   Location [facility]   Staff Present Aundra Dubin, RN, BSN;Kaleesi Guyton Luther Parody, BS, EP, Exercise Physiologist   Supervising physician immediately available to respond to emergencies See telemetry face sheet for immediately available MD   Medication changes reported     No   Fall or balance concerns reported    No   Warm-up and Cool-down Performed as group-led instruction   Resistance Training Performed Yes   VAD Patient? No     Pain Assessment   Currently in Pain? No/denies   Pain Score 0-No pain   Multiple Pain Sites No      Capillary Blood Glucose: No results found for this or any previous visit (from the past 24 hour(s)).    History  Smoking Status  . Never Smoker  Smokeless Tobacco  . Never Used    Goals Met:  Independence with exercise equipment Improved SOB with ADL's Using PLB without cueing & demonstrates good technique Exercise tolerated well No report of cardiac concerns or symptoms Strength training completed today  Goals Unmet:  Not Applicable  Comments: Check out 230   Dr. Sinda Du is Medical Director for [facility] Pulmonary Rehab.

## 2016-09-23 ENCOUNTER — Telehealth: Payer: Self-pay | Admitting: Family Medicine

## 2016-09-23 MED ORDER — GLUCOSE BLOOD VI STRP: ORAL_STRIP | 5 refills | Status: DC

## 2016-09-23 NOTE — Telephone Encounter (Signed)
Resent to [facility]

## 2016-09-23 NOTE — Telephone Encounter (Signed)
Pt needs [facility] test [facility] [place], I called they didn't get the request that was sent on 04/20

## 2016-09-27 ENCOUNTER — Encounter (HOSPITAL_COMMUNITY)
Admission: RE | Admit: 2016-09-27 | Discharge: 2016-09-27 | Disposition: A | Discharge status: Home / Self Care | Payer: Medicare Other | Source: Ambulatory Visit | Attending: Internal Medicine | Admitting: Internal Medicine

## 2016-09-27 ENCOUNTER — Telehealth: Payer: Self-pay | Admitting: Cardiology

## 2016-09-27 DIAGNOSIS — R0600 Dyspnea, unspecified: Secondary | ICD-10-CM | POA: Insufficient documentation

## 2016-09-27 DIAGNOSIS — E039 Hypothyroidism, unspecified: Secondary | ICD-10-CM | POA: Insufficient documentation

## 2016-09-27 DIAGNOSIS — E119 Type 2 diabetes mellitus without complications: Secondary | ICD-10-CM | POA: Insufficient documentation

## 2016-09-27 NOTE — Telephone Encounter (Signed)
Will forward to Dr. McDowell 

## 2016-09-27 NOTE — Progress Notes (Signed)
Daily Session Note  Patient Details  Name: Caleb Butler MRN: [id number] Date of Birth: [birth year] Referring Provider:     PULMONARY REHAB OTHER RESP ORIENTATION from 09/06/2016 in [place]  Referring Provider  [name]      Encounter Date: 09/27/2016  Check In:     Session Check In - 09/27/16 1045      Check-In   Location [facility]   Staff Present Russella Dar, MS, EP, [facility], Exercise Physiologist   Supervising physician immediately available to respond to emergencies See telemetry face sheet for immediately available MD   Medication changes reported     No   Fall or balance concerns reported    No   Warm-up and Cool-down Performed as group-led instruction   Resistance Training Performed Yes   VAD Patient? No     Pain Assessment   Currently in Pain? No/denies   Pain Score 0-No pain   Multiple Pain Sites No      Capillary Blood Glucose: No results found for this or any previous visit (from the past 24 hour(s)).    History  Smoking Status  . Never Smoker  Smokeless Tobacco  . Never Used    Goals Met:  Proper associated with RPD/PD & O2 Sat Improved SOB with ADL's Exercise tolerated well Queuing for purse lip breathing Strength training completed today  Goals Unmet:  Not Applicable  Comments: Check out: 1145   Dr. Sinda Du is Medical Director for [facility] Pulmonary Rehab.

## 2016-09-27 NOTE — Telephone Encounter (Signed)
Pt would like to know when he's to f/u w/ Dr. Domenic Polite again. Per last office visit note it doesn't specify

## 2016-09-27 NOTE — Telephone Encounter (Signed)
I referred him to see [name] at our last visit, and he has maintained follow-up with him since that time including recently. Would continue to follow with Dr.Bensimhon for now and we can consider starting to alternate visits later in the year.

## 2016-09-27 NOTE — Telephone Encounter (Signed)
Pt.notified

## 2016-09-29 ENCOUNTER — Encounter (HOSPITAL_COMMUNITY): Admission: RE | Admit: 2016-09-29 | Payer: Medicare Other | Source: Ambulatory Visit

## 2016-10-04 ENCOUNTER — Encounter (HOSPITAL_COMMUNITY)
Admission: RE | Admit: 2016-10-04 | Discharge: 2016-10-04 | Disposition: A | Discharge status: Home / Self Care | Payer: Medicare Other | Source: Ambulatory Visit | Attending: Internal Medicine | Admitting: Internal Medicine

## 2016-10-04 DIAGNOSIS — R0600 Dyspnea, unspecified: Secondary | ICD-10-CM

## 2016-10-04 DIAGNOSIS — R0689 Other abnormalities of breathing: Principal | ICD-10-CM

## 2016-10-04 NOTE — Progress Notes (Signed)
Daily Session Note  Patient Details  Name: Caleb Butler MRN: [id number] Date of Birth: [birth year] Referring Provider:     PULMONARY REHAB OTHER RESP ORIENTATION from 09/06/2016 in [place]  Referring Provider  [name]      Encounter Date: 10/04/2016  Check In:     Session Check In - 10/04/16 1056      Check-In   Location [facility]   Staff Present Russella Dar, MS, EP, [facility], Exercise Physiologist;Khalin Royce Luther Parody, BS, EP, Exercise Physiologist   Supervising physician immediately available to respond to emergencies See telemetry face sheet for immediately available MD   Medication changes reported     No   Fall or balance concerns reported    No   Warm-up and Cool-down Performed as group-led instruction   Resistance Training Performed Yes   VAD Patient? No     Pain Assessment   Currently in Pain? No/denies   Pain Score 0-No pain   Multiple Pain Sites No      Capillary Blood Glucose: No results found for this or any previous visit (from the past 24 hour(s)).    History  Smoking Status  . Never Smoker  Smokeless Tobacco  . Never Used    Goals Met:  Independence with exercise equipment Improved SOB with ADL's Using PLB without cueing & demonstrates good technique Exercise tolerated well No report of cardiac concerns or symptoms Strength training completed today  Goals Unmet:  Not Applicable  Comments: Check out 1145   Dr. Sinda Du is Medical Director for [facility] Pulmonary Rehab.

## 2016-10-06 ENCOUNTER — Encounter (HOSPITAL_COMMUNITY)
Admission: RE | Admit: 2016-10-06 | Discharge: 2016-10-06 | Disposition: A | Discharge status: Home / Self Care | Payer: Medicare Other | Source: Ambulatory Visit | Attending: Internal Medicine | Admitting: Internal Medicine

## 2016-10-06 DIAGNOSIS — R0600 Dyspnea, unspecified: Secondary | ICD-10-CM

## 2016-10-06 DIAGNOSIS — R0689 Other abnormalities of breathing: Principal | ICD-10-CM

## 2016-10-06 NOTE — Progress Notes (Signed)
Daily Session Note  Patient Details  Name: Caleb Butler MRN: [id number] Date of Birth: [birth year] Referring Provider:     PULMONARY REHAB OTHER RESP ORIENTATION from 09/06/2016 in [place]  Referring Provider  [name]      Encounter Date: 10/06/2016  Check In:     Session Check In - 10/06/16 1403      Check-In   Location [facility]   Staff Present Aundra Dubin, RN, BSN;Leane Loring Luther Parody, BS, EP, Exercise Physiologist   Supervising physician immediately available to respond to emergencies See telemetry face sheet for immediately available MD   Medication changes reported     No   Fall or balance concerns reported    No   Warm-up and Cool-down Performed as group-led instruction   Resistance Training Performed Yes   VAD Patient? No     Pain Assessment   Currently in Pain? No/denies   Pain Score 0-No pain   Multiple Pain Sites No      Capillary Blood Glucose: No results found for this or any previous visit (from the past 24 hour(s)).    History  Smoking Status  . Never Smoker  Smokeless Tobacco  . Never Used    Goals Met:  Independence with exercise equipment Improved SOB with ADL's Using PLB without cueing & demonstrates good technique Exercise tolerated well No report of cardiac concerns or symptoms Strength training completed today  Goals Unmet:  Not Applicable  Comments: Check out 230   Dr. Sinda Du is Medical Director for [facility].

## 2016-10-10 ENCOUNTER — Ambulatory Visit (INDEPENDENT_AMBULATORY_CARE_PROVIDER_SITE_OTHER): Payer: Medicare Other | Admitting: Family Medicine

## 2016-10-10 ENCOUNTER — Encounter: Payer: Self-pay | Admitting: Family Medicine

## 2016-10-10 VITALS — BP 90/70 | HR 82 | Temp 97.4°F | Resp 18 | Ht 69.5 in | Wt 187.0 lb

## 2016-10-10 DIAGNOSIS — M791 Myalgia, unspecified site: Secondary | ICD-10-CM

## 2016-10-10 LAB — CBC WITH DIFFERENTIAL/PLATELET
Basophils Absolute: 93 cells/uL (ref 0–200)
Basophils Relative: 1 %
EOS PCT: 3 %
Eosinophils Absolute: 279 cells/uL (ref 15–500)
HEMATOCRIT: 48.5 % (ref 38.5–50.0)
Hemoglobin: 16.5 g/dL (ref 13.0–17.0)
LYMPHS PCT: 20 %
Lymphs Abs: 1860 cells/uL (ref 850–3900)
MCH: 31.9 pg (ref 27.0–33.0)
MCHC: 34 g/dL (ref 32.0–36.0)
MCV: 93.8 fL (ref 80.0–100.0)
MONO ABS: 651 {cells}/uL (ref 200–950)
MONOS PCT: 7 %
MPV: 10.7 fL (ref 7.5–12.5)
NEUTROS PCT: 69 %
Neutro Abs: 6417 cells/uL (ref 1500–7800)
Platelets: 236 10*3/uL (ref 140–400)
RBC: 5.17 MIL/uL (ref 4.20–5.80)
RDW: 14 % (ref 11.0–15.0)
WBC: 9.3 10*3/uL (ref 3.8–10.8)

## 2016-10-10 NOTE — Progress Notes (Signed)
Subjective:    Patient ID: Caleb Butler, male    DOB: September 02, 1945, 71 y.o.   MRN: [id number]  Medication Refill   11/30/15 He is here to establish care.  PMH is significant for DMII, hypothyroidism, HLD.  He recently started prednisone for sciatica under the care of orthopedist.  Colonosocpy was 2013.  Overdue for prostate exam, hep c test, pneumovax, tdap, and zostavax.  He has no concerns.  AT that time, my plan was: We will check HgA1c and urine microalbumin.  Check LDL and goal is less than 100.  CHeck TSh.  Screen for hep c.  Check PSA.  Colonosocopy up to date.  Receved Tdap today.  Defers pneumovax and Zostavax until next visit.  01/18/16 Patient reports progressive shortness of breath and dyspnea on exertion. He states this is been going on for last 15 years steadily worsening. He reports having had a nuclear stress test from his previous cardiologist that was reportedly normal 2 years ago. He denies any echocardiogram. He denies any orthopnea or paroxysmal nocturnal dyspnea. However he gets easily winded just walking outside and feet his horses. He does have a history of working around large quantities of his spasticity for many years growing up a lot of exposure to agent orange in [place]. Patient was recently referred to a pulmonologist who performed pulmonary function tests which are listed in [place]. Prebronchodilator FEV1 to FVC ratio 75%. Postbronchodilator FEV1 to FVC ratio was 82% both of which are normal. However he had diminished lung volumes of 65% in both FEV1 and FVC. This suggests possible interstitial lung disease. I see no chest x-ray or CT scans performed of his lungs.  At that time, my plan was: CBC in July revealed no evidence of anemia. I will obtain previous workup performed by his cardiologist. If necessary, I'll perform an echocardiogram to evaluate for systolic or diastolic heart failure however his symptoms do not sound compatible with this. Rather I am concerned about  interstitial lung disease given his diminished lung volumes. I will obtain a CT scan of the chest to evaluate further and will likely consult pulmonology for evaluation.  08/18/16 Patient saw a pulmonologist and pulmonary workup was unremarkable aside from some scar tissue which was minimal. Was referred to cardiology. Catheterization revealed ostial stenosis in the left circumflex that was not amenable to PCI as well as a lesion in the ramus. He is being managed medically with Imdur for, Crestor, etc. He is currently taking metformin for diabetes. Patient states that his blood sugars are all less than 160. He denies any hypoglycemia. He is starting an exercise program. He denies any chest pain. Shortness of breath is stable. He denies any polyuria, polydipsia, or blurry vision. He denies any myalgias or right upper quadrant pain on Crestor. He did believe that he saw some memory loss on simvastatin. He switch to Crestor probably one week ago. At the present time he denies any side effects from Crestor.  At that time, my plan was: Blood pressure is controlled. We'll check a TSH TO ENSURE APPROPRIATE DOSE OF LEVOTHYROXINE.   Given the fact the patient  Has known coronary artery disease, I would switch from metformin to jardiance given the data on CV mortality reduction.  He would be an excellent patient for this.  Check hemoglobin A1c and if appropriate, DC metformin and replace with jardiance 25 mg poqday.    10/10/16 Ever since the patient started jardiance, the patient reports frequent polyuria. He states he  is having to go to the bathroom 5 or 6 times a day. He frequent has to wake up at night to the bathroom. However his blood sugars all ranged between 101 130. His hemoglobin A1c prior to switching from metformin to the new medication was 6.4 indicating adequate control. He is frustrated by the polyuria. He denies any dysuria, urinary incontinence, hematuria, or weak stream. About 6 weeks ago he also  developed diffuse myalgias. They involve both shoulders, both biceps, both triceps, and his hips and thighs. His been bitten by many ticks this spring and is possible he may have gotten a tickborne illness. However the muscle aches also started around the time he started Crestor Past Medical History:  Diagnosis Date  . Hiatal hernia   . Hypothyroidism   . Type 2 diabetes mellitus ([place])    Past Surgical History:  Procedure Laterality Date  . Arm surgery Right   . CARDIAC CATHETERIZATION    . FINGER SURGERY     Left right finger reattached.   Marland Kitchen RIGHT/LEFT HEART CATH AND CORONARY ANGIOGRAPHY N/A 07/07/2016   Procedure: Right/Left Heart Cath and Coronary Angiography;  Surgeon: Jolaine Artist, MD;  Location: [facility];  Service: Cardiovascular;  Laterality: N/A;   Current Outpatient Prescriptions on File Prior to Visit  Medication Sig Dispense Refill  . acetaminophen (TYLENOL) 325 MG tablet Take 650 mg by mouth every 4 (four) hours as needed.    Marland Kitchen aspirin 81 MG EC tablet Take 81 mg by mouth at bedtime.     . Glucose Blood (BLOOD GLUCOSE TEST STRIPS) STRP     . glucose blood test strip Check BS BID Dx: E11.9 100 each 5  . isosorbide mononitrate (IMDUR) 30 MG 24 hr tablet Take 1 tablet (30 mg total) by mouth daily. 90 tablet 3  . levothyroxine (SYNTHROID, LEVOTHROID) 100 MCG tablet Take 100 mcg by mouth daily before breakfast.    . Multiple Vitamin (MULTIVITAMIN WITH MINERALS) TABS tablet Take 1 tablet by mouth daily.    . rosuvastatin (CRESTOR) 20 MG tablet Take 1 tablet (20 mg total) by mouth daily. 30 tablet 6  . vitamin B-12 (CYANOCOBALAMIN) 1000 MCG tablet Take 1,000 mcg by mouth daily.    . empagliflozin (JARDIANCE) 25 MG TABS tablet Take 25 mg by mouth daily. (Patient not taking: Reported on 10/10/2016) 30 tablet 5   No current facility-administered medications on file prior to visit.    Allergies  Allergen Reactions  . Bee Venom Swelling  . Ceclor [Cefaclor] Hives  .  Shellfish Allergy Hives    Pt states it is questionable// he eats seafood still without problem.   Social History   Social History  . Marital status: Married    Spouse name: N/A  . Number of children: N/A  . Years of education: N/A   Occupational History  . Not on file.   Social History Main Topics  . Smoking status: Never Smoker  . Smokeless tobacco: Never Used  . Alcohol use 0.6 oz/week    1 Cans of beer per week     Comment: occ  . Drug use: No  . Sexual activity: Not on file   Other Topics Concern  . Not on file   Social History Narrative   Retired. Lives with wife, [name].    Family History  Problem Relation Age of Onset  . Diabetes Mellitus II Father   . Heart failure Father   . Kidney failure Father  Review of Systems  All other systems reviewed and are negative.      Objective:   Physical Exam  Constitutional: He is oriented to person, place, and time. He appears well-developed and well-nourished. No distress.  HENT:  Head: Normocephalic and atraumatic.  Right Ear: External ear normal.  Left Ear: External ear normal.  Nose: Nose normal.  Mouth/Throat: Oropharynx is clear and moist. No oropharyngeal exudate.  Eyes: Conjunctivae and EOM are normal. Pupils are equal, round, and reactive to light. Right eye exhibits no discharge. Left eye exhibits no discharge. No scleral icterus.  Neck: Normal range of motion. Neck supple. No JVD present. No tracheal deviation present. No thyromegaly present.  Cardiovascular: Normal rate, regular rhythm, normal heart sounds and intact distal pulses.  Exam reveals no gallop and no friction rub.   No murmur heard. Pulmonary/Chest: Effort normal and breath sounds normal. No stridor. No respiratory distress. He has no wheezes. He has no rales. He exhibits no tenderness.  Abdominal: Soft. Bowel sounds are normal. He exhibits no distension and no mass. There is no tenderness. There is no rebound and no guarding.    Genitourinary: Rectum normal and prostate normal.  Musculoskeletal: Normal range of motion. He exhibits no edema or tenderness.  Lymphadenopathy:    He has no cervical adenopathy.  Neurological: He is alert and oriented to person, place, and time. He has normal reflexes. No cranial nerve deficit. He exhibits normal muscle tone. Coordination normal.  Skin: Skin is warm. No rash noted. He is not diaphoretic. No erythema. No pallor.  Psychiatric: He has a normal mood and affect. His behavior is normal. Judgment and thought content normal.  Vitals reviewed.         Assessment & Plan:  Myalgia - Plan: CBC with Differential/Platelet, COMPLETE METABOLIC PANEL WITH GFR, B. burgdorfi antibodies by WB, Sedimentation rate, CK  Discontinue jardiance and if the symptoms improve, switch the patient back to metformin. I also recommended he temporarily discontinue Crestor will check a CBC, CMP, CK, sedimentation rate, and Lyme titers. Recheck next week. If the myalgias improve, consider starting the patient back on a lower dose statin and gradually increasing.

## 2016-10-11 ENCOUNTER — Encounter (HOSPITAL_COMMUNITY): Payer: Medicare Other

## 2016-10-11 ENCOUNTER — Encounter (HOSPITAL_COMMUNITY)
Admission: RE | Admit: 2016-10-11 | Discharge: 2016-10-11 | Disposition: A | Discharge status: Home / Self Care | Payer: Medicare Other | Source: Ambulatory Visit | Attending: Internal Medicine | Admitting: Internal Medicine

## 2016-10-11 DIAGNOSIS — R0689 Other abnormalities of breathing: Principal | ICD-10-CM

## 2016-10-11 DIAGNOSIS — R0600 Dyspnea, unspecified: Secondary | ICD-10-CM

## 2016-10-11 LAB — COMPLETE METABOLIC PANEL WITH GFR
ALT: 23 U/L (ref 9–46)
AST: 22 U/L (ref 10–35)
Albumin: 4.6 g/dL (ref 3.6–5.1)
Alkaline Phosphatase: 57 U/L (ref 40–115)
BUN: 19 mg/dL (ref 7–25)
CALCIUM: 9.7 mg/dL (ref 8.6–10.3)
CHLORIDE: 103 mmol/L (ref 98–110)
CO2: 23 mmol/L (ref 20–31)
Creat: 1.37 mg/dL — ABNORMAL HIGH (ref 0.70–1.18)
GFR, Est African American: 60 mL/min (ref >=60)
GFR, Est Non African American: 52 mL/min — ABNORMAL LOW (ref >=60)
Glucose, Bld: 133 mg/dL — ABNORMAL HIGH (ref 70–99)
POTASSIUM: 4.3 mmol/L (ref 3.5–5.3)
Sodium: 140 mmol/L (ref 135–146)
Total Bilirubin: 0.6 mg/dL (ref 0.2–1.2)
Total Protein: 7.2 g/dL (ref 6.1–8.1)

## 2016-10-11 LAB — SEDIMENTATION RATE: SED RATE: 1 mm/h (ref 0–20)

## 2016-10-11 LAB — CK: CK TOTAL: 103 U/L (ref 44–196)

## 2016-10-11 NOTE — Progress Notes (Signed)
Daily Session Note  Patient Details  Name: Caleb Butler MRN: [id number] Date of Birth: 12-15-1945 Referring Provider:     PULMONARY REHAB OTHER RESP ORIENTATION from 09/06/2016 in [place]  Referring Provider  [name]      Encounter Date: 10/11/2016  Check In:     Session Check In - 10/11/16 1114      Check-In   Location [facility]   Staff Present Aundra Dubin, RN, BSN;Ansley Mangiapane Luther Parody, BS, EP, Exercise Physiologist   Supervising physician immediately available to respond to emergencies See telemetry face sheet for immediately available MD   Medication changes reported     No   Fall or balance concerns reported    No   Warm-up and Cool-down Performed as group-led instruction   Resistance Training Performed Yes   VAD Patient? No     Pain Assessment   Currently in Pain? No/denies   Pain Score 0-No pain   Multiple Pain Sites No      Capillary Blood Glucose: Results for orders placed or performed in visit on 10/10/16 (from the past 24 hour(s))  CBC with Differential/Platelet     Status: None   Collection Time: 10/10/16 12:48 PM  Result Value Ref Range   WBC 9.3 3.8 - 10.8 K/uL   RBC 5.17 4.20 - 5.80 MIL/uL   Hemoglobin 16.5 13.0 - 17.0 g/dL   HCT 48.5 38.5 - 50.0 %   MCV 93.8 80.0 - 100.0 fL   MCH 31.9 27.0 - 33.0 pg   MCHC 34.0 32.0 - 36.0 g/dL   RDW 14.0 11.0 - 15.0 %   Platelets 236 140 - 400 K/uL   MPV 10.7 7.5 - 12.5 fL   Neutro Abs 6,417 1,500 - 7,800 cells/uL   Lymphs Abs 1,860 850 - 3,900 cells/uL   Monocytes Absolute 651 200 - 950 cells/uL   Eosinophils Absolute 279 15 - 500 cells/uL   Basophils Absolute 93 0 - 200 cells/uL   Neutrophils Relative % 69 %   Lymphocytes Relative 20 %   Monocytes Relative 7 %   Eosinophils Relative 3 %   Basophils Relative 1 %   Smear Review Criteria for review not met    Narrative   Performed at:  [address], [address]        [place], [place] [address]  COMPLETE METABOLIC PANEL WITH GFR     Status: Abnormal   Collection Time: 10/10/16 12:48 PM  Result Value Ref Range   Sodium 140 135 - 146 mmol/L   Potassium 4.3 3.5 - 5.3 mmol/L   Chloride 103 98 - 110 mmol/L   CO2 23 20 - 31 mmol/L   Glucose, Bld 133 (H) 70 - 99 mg/dL   BUN 19 7 - 25 mg/dL   Creat 1.37 (H) 0.70 - 1.18 mg/dL   Total Bilirubin 0.6 0.2 - 1.2 mg/dL   Alkaline Phosphatase 57 40 - 115 U/L   AST 22 10 - 35 U/L   ALT 23 9 - 46 U/L   Total Protein 7.2 6.1 - 8.1 g/dL   Albumin 4.6 3.6 - 5.1 g/dL   Calcium 9.7 8.6 - 10.3 mg/dL   GFR, Est African American 60 >=60 mL/min   GFR, Est Non African American 52 (L) >=60 mL/min   Narrative   Performed at:  [facility]  [address]                [place], [place] [address] Inactive UNIT code [phone] replaced with UNIT code [id number]  Sedimentation rate     Status: None   Collection Time: 10/10/16 12:48 PM  Result Value Ref Range   Sed Rate 1 0 - 20 mm/hr   Narrative   Performed at:  [address], [address]                [place], [place] [address]  CK     Status: None   Collection Time: 10/10/16 12:48 PM  Result Value Ref Range   Total CK 103 44 - 196 U/L   Narrative   Performed at:  [address], [address]                [place], [place] [address]  Lyme Winferd Humphrey, Mellissa Kohut. Blt. IgG & IgM w/bands     Status: None (Preliminary result)   Collection Time: 10/10/16 12:48 PM  Result Value Ref Range   B burgdorferi IgG Abs (IB)     Lyme Disease 18 kD IgG     Lyme Disease 23 kD IgG     Lyme Disease 28 kD IgG     Lyme Disease 30 kD IgG     Lyme Disease 39 kD IgG     Lyme Disease 41 kD IgG     Lyme Disease 45 kD IgG     Lyme Disease 58 kD IgG     Lyme Disease 66 kD IgG     Lyme Disease 93 kD IgG     B burgdorferi IgM Abs (IB)     Lyme Disease 23 kD IgM     Lyme Disease 39 kD IgM     Lyme Disease 41 kD  IgM     Narrative   Performed at:  [address], [address]                [place], [place] [address]      History  Smoking Status  . Never Smoker  Smokeless Tobacco  . Never Used    Goals Met:  Independence with exercise equipment Exercise tolerated well No report of cardiac concerns or symptoms Strength training completed today  Goals Unmet:  Not Applicable  Comments: Check out 1145   Dr. Sinda Du is Medical Director for [facility].

## 2016-10-13 ENCOUNTER — Encounter (HOSPITAL_COMMUNITY)
Admission: RE | Admit: 2016-10-13 | Discharge: 2016-10-13 | Disposition: A | Discharge status: Home / Self Care | Payer: Medicare Other | Source: Ambulatory Visit | Attending: Internal Medicine | Admitting: Internal Medicine

## 2016-10-13 DIAGNOSIS — R0600 Dyspnea, unspecified: Secondary | ICD-10-CM

## 2016-10-13 DIAGNOSIS — R0689 Other abnormalities of breathing: Principal | ICD-10-CM

## 2016-10-13 NOTE — Progress Notes (Signed)
Daily Session Note  Patient Details  Name: Caleb Butler MRN: [id number] Date of Birth: [birth year] Referring Provider:     PULMONARY REHAB OTHER RESP ORIENTATION from 09/06/2016 in [place]  Referring Provider  [name]      Encounter Date: 10/13/2016  Check In:     Session Check In - 10/13/16 1340      Check-In   Location [facility]   Staff Present Diane Angelina Pih, MS, EP, [facility], Exercise Physiologist;Ruby Dilone Luther Parody, BS, EP, Exercise Physiologist   Supervising physician immediately available to respond to emergencies See telemetry face sheet for immediately available MD   Medication changes reported     No   Fall or balance concerns reported    No   Warm-up and Cool-down Performed as group-led instruction   Resistance Training Performed Yes   VAD Patient? No     Pain Assessment   Currently in Pain? No/denies   Pain Score 0-No pain   Multiple Pain Sites No      Capillary Blood Glucose: No results found for this or any previous visit (from the past 24 hour(s)).    History  Smoking Status  . Never Smoker  Smokeless Tobacco  . Never Used    Goals Met:  Independence with exercise equipment Improved SOB with ADL's Using PLB without cueing & demonstrates good technique Exercise tolerated well No report of cardiac concerns or symptoms Strength training completed today  Goals Unmet:  Not Applicable  Comments: Check out 230   Dr. Sinda Du is Medical Director for [facility].

## 2016-10-14 ENCOUNTER — Ambulatory Visit (INDEPENDENT_AMBULATORY_CARE_PROVIDER_SITE_OTHER): Payer: Medicare Other | Admitting: Family Medicine

## 2016-10-14 ENCOUNTER — Encounter: Payer: Self-pay | Admitting: Family Medicine

## 2016-10-14 VITALS — BP 98/58 | HR 76 | Temp 97.8°F | Resp 20 | Ht 69.5 in | Wt 187.0 lb

## 2016-10-14 DIAGNOSIS — M791 Myalgia, unspecified site: Secondary | ICD-10-CM

## 2016-10-14 MED ORDER — LEVOTHYROXINE SODIUM 100 MCG PO TABS: 100.0000 ug | ORAL_TABLET | Freq: Every day | ORAL | 3 refills | Status: DC

## 2016-10-14 NOTE — Progress Notes (Signed)
Subjective:    Patient ID: Caleb Butler, male    DOB: September 02, 1945, 71 y.o.   MRN: [id number]  Medication Refill   11/30/15 He is here to establish care.  PMH is significant for DMII, hypothyroidism, HLD.  He recently started prednisone for sciatica under the care of orthopedist.  Colonosocpy was 2013.  Overdue for prostate exam, hep c test, pneumovax, tdap, and zostavax.  He has no concerns.  AT that time, my plan was: We will check HgA1c and urine microalbumin.  Check LDL and goal is less than 100.  CHeck TSh.  Screen for hep c.  Check PSA.  Colonosocopy up to date.  Receved Tdap today.  Defers pneumovax and Zostavax until next visit.  01/18/16 Patient reports progressive shortness of breath and dyspnea on exertion. He states this is been going on for last 15 years steadily worsening. He reports having had a nuclear stress test from his previous cardiologist that was reportedly normal 2 years ago. He denies any echocardiogram. He denies any orthopnea or paroxysmal nocturnal dyspnea. However he gets easily winded just walking outside and feet his horses. He does have a history of working around large quantities of his spasticity for many years growing up a lot of exposure to agent orange in [place]. Patient was recently referred to a pulmonologist who performed pulmonary function tests which are listed in [place]. Prebronchodilator FEV1 to FVC ratio 75%. Postbronchodilator FEV1 to FVC ratio was 82% both of which are normal. However he had diminished lung volumes of 65% in both FEV1 and FVC. This suggests possible interstitial lung disease. I see no chest x-ray or CT scans performed of his lungs.  At that time, my plan was: CBC in July revealed no evidence of anemia. I will obtain previous workup performed by his cardiologist. If necessary, I'll perform an echocardiogram to evaluate for systolic or diastolic heart failure however his symptoms do not sound compatible with this. Rather I am concerned about  interstitial lung disease given his diminished lung volumes. I will obtain a CT scan of the chest to evaluate further and will likely consult pulmonology for evaluation.  08/18/16 Patient saw a pulmonologist and pulmonary workup was unremarkable aside from some scar tissue which was minimal. Was referred to cardiology. Catheterization revealed ostial stenosis in the left circumflex that was not amenable to PCI as well as a lesion in the ramus. He is being managed medically with Imdur for, Crestor, etc. He is currently taking metformin for diabetes. Patient states that his blood sugars are all less than 160. He denies any hypoglycemia. He is starting an exercise program. He denies any chest pain. Shortness of breath is stable. He denies any polyuria, polydipsia, or blurry vision. He denies any myalgias or right upper quadrant pain on Crestor. He did believe that he saw some memory loss on simvastatin. He switch to Crestor probably one week ago. At the present time he denies any side effects from Crestor.  At that time, my plan was: Blood pressure is controlled. We'll check a TSH TO ENSURE APPROPRIATE DOSE OF LEVOTHYROXINE.   Given the fact the patient  Has known coronary artery disease, I would switch from metformin to jardiance given the data on CV mortality reduction.  He would be an excellent patient for this.  Check hemoglobin A1c and if appropriate, DC metformin and replace with jardiance 25 mg poqday.    10/10/16 Ever since the patient started jardiance, the patient reports frequent polyuria. He states he  is having to go to the bathroom 5 or 6 times a day. He frequent has to wake up at night to the bathroom. However his blood sugars all ranged between 101 130. His hemoglobin A1c prior to switching from metformin to the new medication was 6.4 indicating adequate control. He is frustrated by the polyuria. He denies any dysuria, urinary incontinence, hematuria, or weak stream. About 6 weeks ago he also  developed diffuse myalgias. They involve both shoulders, both biceps, both triceps, and his hips and thighs. His been bitten by many ticks this spring and is possible he may have gotten a tickborne illness. However the muscle aches also started around the time he started Crestor.  At that time, my plan was: Discontinue jardiance and if the symptoms improve, switch the patient back to metformin. I also recommended he temporarily discontinue Crestor will check a CBC, CMP, CK, sedimentation rate, and Lyme titers. Recheck next week. If the myalgias improve, consider starting the patient back on a lower dose statin and gradually increasing.  10/14/16 Polyuria is already improving after discontinuing jardiance.  However the diffuse muscle aches are no better after stopping Crestor. It is only been 4 days. He continues complain of pain in his shoulders and in his hips as well as his arms and upper legs. EMR is certainly on the differential diagnosis. His most recent lab work as listed below. I'm reassured by his normal sedimentation rate. Lyme test is still pending. Office Visit on 10/10/2016  Component Date Value Ref Range Status  . WBC 10/10/2016 9.3  3.8 - 10.8 K/uL Final  . RBC 10/10/2016 5.17  4.20 - 5.80 MIL/uL Final  . Hemoglobin 10/10/2016 16.5  13.0 - 17.0 g/dL Final  . HCT 10/10/2016 48.5  38.5 - 50.0 % Final  . MCV 10/10/2016 93.8  80.0 - 100.0 fL Final  . MCH 10/10/2016 31.9  27.0 - 33.0 pg Final  . MCHC 10/10/2016 34.0  32.0 - 36.0 g/dL Final  . RDW 10/10/2016 14.0  11.0 - 15.0 % Final  . Platelets 10/10/2016 236  140 - 400 K/uL Final  . MPV 10/10/2016 10.7  7.5 - 12.5 fL Final  . Neutro Abs 10/10/2016 6417  1,500 - 7,800 cells/uL Final  . Lymphs Abs 10/10/2016 1860  850 - 3,900 cells/uL Final  . Monocytes Absolute 10/10/2016 651  200 - 950 cells/uL Final  . Eosinophils Absolute 10/10/2016 279  15 - 500 cells/uL Final  . Basophils Absolute 10/10/2016 93  0 - 200 cells/uL Final  .  Neutrophils Relative % 10/10/2016 69  % Final  . Lymphocytes Relative 10/10/2016 20  % Final  . Monocytes Relative 10/10/2016 7  % Final  . Eosinophils Relative 10/10/2016 3  % Final  . Basophils Relative 10/10/2016 1  % Final  . Smear Review 10/10/2016 Criteria for review not met   Final  . Sodium 10/10/2016 140  135 - 146 mmol/L Final  . Potassium 10/10/2016 4.3  3.5 - 5.3 mmol/L Final  . Chloride 10/10/2016 103  98 - 110 mmol/L Final  . CO2 10/10/2016 23  20 - 31 mmol/L Final  . Glucose, Bld 10/10/2016 133* 70 - 99 mg/dL Final  . BUN 10/10/2016 19  7 - 25 mg/dL Final  . Creat 10/10/2016 1.37* 0.70 - 1.18 mg/dL Final   Comment:   For patients > or = 70 years of age: The upper reference limit for Creatinine is approximately 13% higher for people identified as African-American.     Marland Kitchen  Total Bilirubin 10/10/2016 0.6  0.2 - 1.2 mg/dL Final  . Alkaline Phosphatase 10/10/2016 57  40 - 115 U/L Final  . AST 10/10/2016 22  10 - 35 U/L Final  . ALT 10/10/2016 23  9 - 46 U/L Final  . Total Protein 10/10/2016 7.2  6.1 - 8.1 g/dL Final  . Albumin 78/55/7613 4.6  3.6 - 5.1 g/dL Final  . Calcium 09/38/6892 9.7  8.6 - 10.3 mg/dL Final  . GFR, Est African American 10/10/2016 60  >=60 mL/min Final  . GFR, Est Non African American 10/10/2016 52* >=60 mL/min Final  . Sed Rate 10/10/2016 1  0 - 20 mm/hr Final  . Total CK 10/10/2016 103  44 - 196 U/L Final   Comment: ** Please note change in reference range(s). **     . Lyme Disease 41 kD IgM 10/10/2016    Preliminary   Comment: A positive IgM test result alone is not recommended for use in determining active disease due to a high likelihood of a false-positive result in persons with illness greater than 1 month duration. The CDC recommends testing by the EIA antibody test, followed by the Immunoblot confirmatory test only when the EIA test is positive.     Past Medical History:  Diagnosis Date  . Hiatal hernia   . Hypothyroidism   . Type 2  diabetes mellitus (HCC)    Past Surgical History:  Procedure Laterality Date  . Arm surgery Right   . CARDIAC CATHETERIZATION    . FINGER SURGERY     Left right finger reattached.   Marland Kitchen RIGHT/LEFT HEART CATH AND CORONARY ANGIOGRAPHY N/A 07/07/2016   Procedure: Right/Left Heart Cath and Coronary Angiography;  Surgeon: Dolores Patty, MD;  Location: [facility] INVASIVE CV LAB;  Service: Cardiovascular;  Laterality: N/A;   Current Outpatient Prescriptions on File Prior to Visit  Medication Sig Dispense Refill  . acetaminophen (TYLENOL) 325 MG tablet Take 650 mg by mouth every 4 (four) hours as needed.    Marland Kitchen aspirin 81 MG EC tablet Take 81 mg by mouth at bedtime.     . Glucose Blood (BLOOD GLUCOSE TEST STRIPS) STRP     . glucose blood test strip Check BS BID Dx: E11.9 100 each 5  . isosorbide mononitrate (IMDUR) 30 MG 24 hr tablet Take 1 tablet (30 mg total) by mouth daily. 90 tablet 3  . Multiple Vitamin (MULTIVITAMIN WITH MINERALS) TABS tablet Take 1 tablet by mouth daily.    . vitamin B-12 (CYANOCOBALAMIN) 1000 MCG tablet Take 1,000 mcg by mouth daily.    . empagliflozin (JARDIANCE) 25 MG TABS tablet Take 25 mg by mouth daily. (Patient not taking: Reported on 10/10/2016) 30 tablet 5  . rosuvastatin (CRESTOR) 20 MG tablet Take 1 tablet (20 mg total) by mouth daily. (Patient not taking: Reported on 10/14/2016) 30 tablet 6   No current facility-administered medications on file prior to visit.    Allergies  Allergen Reactions  . Bee Venom Swelling  . Ceclor [Cefaclor] Hives  . Shellfish Allergy Hives    Pt states it is questionable// he eats seafood still without problem.   Social History   Social History  . Marital status: Married    Spouse name: N/A  . Number of children: N/A  . Years of education: N/A   Occupational History  . Not on file.   Social History Main Topics  . Smoking status: Never Smoker  . Smokeless tobacco: Never Used  . Alcohol use 0.6  oz/week    1 Cans of beer per  week     Comment: occ  . Drug use: No  . Sexual activity: Not on file   Other Topics Concern  . Not on file   Social History Narrative   Retired. Lives with wife, [name].    Family History  Problem Relation Age of Onset  . Diabetes Mellitus II Father   . Heart failure Father   . Kidney failure Father       Review of Systems  All other systems reviewed and are negative.      Objective:   Physical Exam  Constitutional: He is oriented to person, place, and time. He appears well-developed and well-nourished. No distress.  HENT:  Head: Normocephalic and atraumatic.  Right Ear: External ear normal.  Left Ear: External ear normal.  Nose: Nose normal.  Mouth/Throat: Oropharynx is clear and moist. No oropharyngeal exudate.  Eyes: Conjunctivae and EOM are normal. Pupils are equal, round, and reactive to light. Right eye exhibits no discharge. Left eye exhibits no discharge. No scleral icterus.  Neck: Normal range of motion. Neck supple. No JVD present. No tracheal deviation present. No thyromegaly present.  Cardiovascular: Normal rate, regular rhythm, normal heart sounds and intact distal pulses.  Exam reveals no gallop and no friction rub.   No murmur heard. Pulmonary/Chest: Effort normal and breath sounds normal. No stridor. No respiratory distress. He has no wheezes. He has no rales. He exhibits no tenderness.  Abdominal: Soft. Bowel sounds are normal. He exhibits no distension and no mass. There is no tenderness. There is no rebound and no guarding.  Musculoskeletal: He exhibits tenderness.       Right shoulder: He exhibits decreased range of motion.       Left shoulder: He exhibits decreased range of motion.  Lymphadenopathy:    He has no cervical adenopathy.  Neurological: He is alert and oriented to person, place, and time. He has normal reflexes. No cranial nerve deficit. He exhibits normal muscle tone. Coordination normal.  Skin: Skin is warm. No rash noted. He is not  diaphoretic. No erythema. No pallor.  Vitals reviewed.         Assessment & Plan:  Myalgia  I still believe this is from the statins. I do not believe that he is has sufficient time for the medication to fully leave his system. I have asked for 1 week. If in 1 week, the myalgias are no better, I will consider putting the patient on prednisone for possible PMR. I believe this is less likely given his normal sedimentation rate however. His CK levels are normal ruling out myositis for the most part. I will also follow-up on the Lyme titer in one week. If negative, consider treatment for PMR if the myalgias are not improving.

## 2016-10-18 ENCOUNTER — Encounter (HOSPITAL_COMMUNITY)
Admission: RE | Admit: 2016-10-18 | Discharge: 2016-10-18 | Disposition: A | Discharge status: Home / Self Care | Payer: Medicare Other | Source: Ambulatory Visit | Attending: Internal Medicine | Admitting: Internal Medicine

## 2016-10-18 ENCOUNTER — Ambulatory Visit: Payer: Medicare Other | Admitting: Cardiology

## 2016-10-18 DIAGNOSIS — R0600 Dyspnea, unspecified: Secondary | ICD-10-CM

## 2016-10-18 DIAGNOSIS — R0689 Other abnormalities of breathing: Principal | ICD-10-CM

## 2016-10-18 NOTE — Progress Notes (Signed)
Daily Session Note  Patient Details  Name: Caleb Butler MRN: [id number] Date of Birth: Oct 09, 1945 Referring Provider:     PULMONARY REHAB OTHER RESP ORIENTATION from 09/06/2016 in [place]  Referring Provider  [name]      Encounter Date: 10/18/2016  Check In:     Session Check In - 10/18/16 1047      Check-In   Location [facility]   Staff Present Russella Dar, MS, EP, [facility], Exercise Physiologist;Deajah Erkkila Luther Parody, BS, EP, Exercise Physiologist   Supervising physician immediately available to respond to emergencies See telemetry face sheet for immediately available MD   Medication changes reported     No   Fall or balance concerns reported    No   Warm-up and Cool-down Performed as group-led instruction   Resistance Training Performed Yes   VAD Patient? No     Pain Assessment   Currently in Pain? No/denies   Pain Score 0-No pain   Multiple Pain Sites No      Capillary Blood Glucose: No results found for this or any previous visit (from the past 24 hour(s)).    History  Smoking Status  . Never Smoker  Smokeless Tobacco  . Never Used    Goals Met:  Independence with exercise equipment Improved SOB with ADL's Using PLB without cueing & demonstrates good technique Exercise tolerated well No report of cardiac concerns or symptoms Strength training completed today  Goals Unmet:  Not Applicable  Comments: Check out 1145   Dr. Sinda Du is Medical Director for [facility].

## 2016-10-20 ENCOUNTER — Encounter (HOSPITAL_COMMUNITY)
Admission: RE | Admit: 2016-10-20 | Discharge: 2016-10-20 | Disposition: A | Discharge status: Home / Self Care | Payer: Medicare Other | Source: Ambulatory Visit | Attending: Internal Medicine | Admitting: Internal Medicine

## 2016-10-20 DIAGNOSIS — R0600 Dyspnea, unspecified: Secondary | ICD-10-CM

## 2016-10-20 DIAGNOSIS — R0689 Other abnormalities of breathing: Principal | ICD-10-CM

## 2016-10-20 LAB — LYME ABY, WSTRN BLT IGG & IGM W/BANDS
B burgdorferi IgG Abs (IB): NEGATIVE
B burgdorferi IgM Abs (IB): NEGATIVE
LYME DISEASE 28 KD IGG: NONREACTIVE
LYME DISEASE 30 KD IGG: NONREACTIVE
LYME DISEASE 41 KD IGG: REACTIVE — AB
LYME DISEASE 45 KD IGG: NONREACTIVE
LYME DISEASE 66 KD IGG: REACTIVE — AB
LYME DISEASE 93 KD IGG: NONREACTIVE
Lyme Disease 18 kD IgG: NONREACTIVE
Lyme Disease 23 kD IgG: NONREACTIVE
Lyme Disease 23 kD IgM: NONREACTIVE
Lyme Disease 39 kD IgG: NONREACTIVE
Lyme Disease 39 kD IgM: NONREACTIVE
Lyme Disease 41 kD IgM: NONREACTIVE
Lyme Disease 58 kD IgG: NONREACTIVE

## 2016-10-20 NOTE — Progress Notes (Signed)
Pulmonary Individual Treatment Plan  Patient Details  Name: Caleb Butler MRN: [id number] Date of Birth: 1945-09-11 Referring Provider:     PULMONARY REHAB OTHER RESP ORIENTATION from 09/06/2016 in [place]  Referring Provider  [name]      Initial Encounter Date:    [place] from 09/06/2016 in [place]  Date  09/06/16  Referring Provider  [name]      Visit Diagnosis: Dyspnea and respiratory abnormalities  Patient's Home Medications on Admission:   Current Outpatient Prescriptions:  .  acetaminophen (TYLENOL) 325 MG tablet, Take 650 mg by mouth every 4 (four) hours as needed., Disp: , Rfl:  .  aspirin 81 MG EC tablet, Take 81 mg by mouth at bedtime. , Disp: , Rfl:  .  empagliflozin (JARDIANCE) 25 MG TABS tablet, Take 25 mg by mouth daily. (Patient not taking: Reported on 10/10/2016), Disp: 30 tablet, Rfl: 5 .  Glucose Blood (BLOOD GLUCOSE TEST STRIPS) STRP, , Disp: , Rfl:  .  glucose blood test strip, Check BS BID Dx: E11.9, Disp: 100 each, Rfl: 5 .  isosorbide mononitrate (IMDUR) 30 MG 24 hr tablet, Take 1 tablet (30 mg total) by mouth daily., Disp: 90 tablet, Rfl: 3 .  levothyroxine (SYNTHROID, LEVOTHROID) 100 MCG tablet, Take 1 tablet (100 mcg total) by mouth daily before breakfast., Disp: 90 tablet, Rfl: 3 .  Multiple Vitamin (MULTIVITAMIN WITH MINERALS) TABS tablet, Take 1 tablet by mouth daily., Disp: , Rfl:  .  rosuvastatin (CRESTOR) 20 MG tablet, Take 1 tablet (20 mg total) by mouth daily. (Patient not taking: Reported on 10/14/2016), Disp: 30 tablet, Rfl: 6 .  vitamin B-12 (CYANOCOBALAMIN) 1000 MCG tablet, Take 1,000 mcg by mouth daily., Disp: , Rfl:   Past Medical History: Past Medical History:  Diagnosis Date  . Hiatal hernia   . Hypothyroidism   . Type 2 diabetes mellitus (HCC)     Tobacco Use: History  Smoking Status  . Never Smoker  Smokeless Tobacco  . Never Used     Labs: Recent Review Flowsheet Data    Labs for ITP Cardiac and Pulmonary Rehab Latest Ref Rng & Units 11/30/2015 07/07/2016 07/07/2016 07/07/2016 08/18/2016   Cholestrol 125 - 200 mg/dL 140 - - - -   LDLCALC <130 mg/dL 57 - - - -   HDL >=40 mg/dL 41 - - - -   Trlycerides <150 mg/dL 210(H) - - - -   Hemoglobin A1c <5.7 % 7.3(H) - - - 6.4(H)   PHART 7.350 - 7.450 - 7.382 - - -   PCO2ART 32.0 - 48.0 mmHg - 40.7 - - -   HCO3 20.0 - 28.0 mmol/L - 24.2 21.2 24.1 -   TCO2 0 - 100 mmol/L - 25 22 25  -   ACIDBASEDEF 0.0 - 2.0 mmol/L - 1.0 5.0(H) 2.0 -   O2SAT % - 95.0 68.0 69.0 -      Capillary Blood Glucose: Lab Results  Component Value Date   GLUCAP 205 (H) 09/08/2016   GLUCAP 97 07/07/2016   GLUCAP 148 (H) 07/07/2016     ADL UCSD:     Pulmonary Assessment Scores    Row Name 09/06/16 1336         ADL UCSD   ADL Phase Entry     SOB Score total 56     Rest 1     Walk 8     Stairs 4     Bath  1     Dress 2     Shop 2       CAT Score   CAT Score 22       mMRC Score   mMRC Score 3        Pulmonary Function Assessment:     Pulmonary Function Assessment - 09/06/16 1332      Pulmonary Function Tests   FVC% 63 %   FEV1% 69 %   FEV1/FVC Ratio 108   DLCO% 66 %     Initial Spirometry Results   FVC% 64 %   FEV1% 65 %   FEV1/FVC Ratio 75     Post Bronchodilator Spirometry Results   FVC% 63 %   FEV1% 69 %   FEV1/FVC Ratio 81     Breath   Bilateral Breath Sounds Clear   Shortness of Breath Yes  SOB with exertion      Exercise Target Goals:    Exercise Program Goal: Individual exercise prescription set with THRR, safety & activity barriers. Participant demonstrates ability to understand and report RPE using BORG scale, to self-measure pulse accurately, and to acknowledge the importance of the exercise prescription.  Exercise Prescription Goal: Starting with aerobic activity 30 plus minutes a day, 3 days per week for initial exercise prescription. Provide home  exercise prescription and guidelines that participant acknowledges understanding prior to discharge.  Activity Barriers & Risk Stratification:   6 Minute Walk:     6 Minute Walk    Row Name 09/06/16 1152         6 Minute Walk   Phase Initial     Distance 1400 feet     Distance % Change 0 %     Walk Time 6 minutes     # of Rest Breaks 0     MPH 2.65     METS 3.03     RPE 9     Perceived Dyspnea  13     VO2 Peak 10.01     Symptoms No     Resting HR 83 bpm     Resting BP 100/74     Max Ex. HR 89 bpm     Max Ex. BP 114/80     2 Minute Post BP 102/76        Oxygen Initial Assessment:     Oxygen Initial Assessment - 09/06/16 1345      Home Oxygen   Home Oxygen Device None   Sleep Oxygen Prescription None   Home Exercise Oxygen Prescription None   Home at Rest Exercise Oxygen Prescription None     Initial 6 min Walk   Oxygen Used None     Program Oxygen Prescription   Program Oxygen Prescription None      Oxygen Re-Evaluation:   Oxygen Discharge (Final Oxygen Re-Evaluation):   Initial Exercise Prescription:     Initial Exercise Prescription - 09/06/16 1100      Date of Initial Exercise RX and Referring Provider   Date 09/06/16   Referring Provider [name]     Treadmill   MPH 2   Grade 0   Minutes 15   METs 2.5     Recumbant Elliptical   Level 1   RPM 37   Watts 37   Minutes 20   METs 2.2     Prescription Details   Frequency (times per week) 2   Duration Progress to 30 minutes of continuous aerobic without signs/symptoms of physical distress  Intensity   THRR 40-80% of Max Heartrate [phone]   Ratings of Perceived Exertion 11-13   Perceived Dyspnea 0-4     Progression   Progression Continue progressive overload as per policy without signs/symptoms or physical distress.     Resistance Training   Training Prescription Yes   Weight 1   Reps 10-15      Perform Capillary Blood Glucose checks as needed.  Exercise  Prescription Changes:      Exercise Prescription Changes    Row Name 09/19/16 1400 10/14/16 1400 10/18/16 1200         Response to Exercise   Blood Pressure (Admit) 110/60 98/60 102/54     Blood Pressure (Exercise) 134/72 118/66 108/60     Blood Pressure (Exit) 100/64 92/56 104/62     Heart Rate (Admit) 86 bpm 81 bpm 74 bpm     Heart Rate (Exercise) 98 bpm 89 bpm 81 bpm     Heart Rate (Exit) 79 bpm 85 bpm 73 bpm     Oxygen Saturation (Admit) 94 % 95 % 94 %     Oxygen Saturation (Exercise) 96 % 96 % 97 %     Oxygen Saturation (Exit) 96 % 96 % 96 %     Rating of Perceived Exertion (Exercise) 11 10 11      Perceived Dyspnea (Exercise) 11 10 11      Duration Progress to 30 minutes of  aerobic without signs/symptoms of physical distress Progress to 30 minutes of  aerobic without signs/symptoms of physical distress Progress to 30 minutes of  aerobic without signs/symptoms of physical distress     Intensity THRR unchanged THRR unchanged THRR unchanged       Progression   Progression Continue to progress workloads to maintain intensity without signs/symptoms of physical distress. Continue to progress workloads to maintain intensity without signs/symptoms of physical distress. Continue to progress workloads to maintain intensity without signs/symptoms of physical distress.       Resistance Training   Training Prescription Yes Yes Yes     Weight 4 4 4      Reps 10-15 10-15 10-15       Treadmill   MPH 2.5 2.5 2.5     Grade 0 0 0     Minutes 15 15 15      METs 4.06 4.06 4.06       Recumbant Elliptical   Level 2 2 3      RPM 49 49 61     Watts 69 69 80     Minutes 20 20 20      METs 3.5 3.5 4.6       Home Exercise Plan   Plans to continue exercise at Home (comment) Home (comment) Home (comment)     Frequency Add 2 additional days to program exercise sessions. Add 2 additional days to program exercise sessions. Add 2 additional days to program exercise sessions.        Exercise  Comments:      Exercise Comments    Row Name 09/19/16 1427 10/18/16 1249         Exercise Comments Patient is very determined and is progressing well in PR.  Patient is progressing well in PR.          Exercise Goals and Review:      Exercise Goals    Row Name 09/06/16 1345             Exercise Goals   Increase Physical Activity Yes  Intervention Provide advice, education, support and counseling about physical activity/exercise needs.;Develop an individualized exercise prescription for aerobic and resistive training based on initial evaluation findings, risk stratification, comorbidities and participant's personal goals.       Expected Outcomes Achievement of increased cardiorespiratory fitness and enhanced flexibility, muscular endurance and strength shown through measurements of functional capacity and personal statement of participant.       Increase Strength and Stamina Yes       Intervention Provide advice, education, support and counseling about physical activity/exercise needs.;Develop an individualized exercise prescription for aerobic and resistive training based on initial evaluation findings, risk stratification, comorbidities and participant's personal goals.       Expected Outcomes Achievement of increased cardiorespiratory fitness and enhanced flexibility, muscular endurance and strength shown through measurements of functional capacity and personal statement of participant.          Exercise Goals Re-Evaluation :     Exercise Goals Re-Evaluation    Row Name 09/22/16 8850 10/20/16 1507           Exercise Goal Re-Evaluation   Exercise Goals Review Increase Physical Activity;Increase Strenth and Stamina  Breathe better; get in shape.  Increase Physical Activity;Increase Strenth and Stamina      Comments After completing 5 sessions, patient has had some progression. Will continue to monitor.  Patient has completed 13 sessions. Eventhough the he has had  some progression, he does not feel like his strength and stamina have increased. He hopes this will change after completing the program. 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       Expected Outcomes Patient will continue to work toward his goal to beathe better and get in shape.  Patient will complete the program with increased strenght, stamina, and activity.          Discharge Exercise Prescription (Final Exercise Prescription Changes):     Exercise Prescription Changes - 10/18/16 1200      Response to Exercise   Blood Pressure (Admit) 102/54   Blood Pressure (Exercise) 108/60   Blood Pressure (Exit) 104/62   Heart Rate (Admit) 74 bpm   Heart Rate (Exercise) 81 bpm   Heart Rate (Exit) 73 bpm   Oxygen Saturation (Admit) 94 %   Oxygen Saturation (Exercise) 97 %   Oxygen Saturation (Exit) 96 %   Rating of Perceived Exertion (Exercise) 11   Perceived Dyspnea (Exercise) 11   Duration Progress to 30 minutes of  aerobic without signs/symptoms of physical distress   Intensity THRR unchanged     Progression   Progression Continue to progress workloads to maintain intensity without signs/symptoms of physical distress.     Resistance Training   Training Prescription Yes   Weight 4   Reps 10-15     Treadmill   MPH 2.5   Grade 0   Minutes 15   METs 4.06     Recumbant Elliptical   Level 3   RPM 61   Watts 80   Minutes 20   METs 4.6     Home Exercise Plan   Plans to continue exercise at Home (comment)   Frequency Add 2 additional days to program exercise sessions.      Nutrition:  Target Goals: Understanding of nutrition guidelines, daily intake of sodium 1500mg , cholesterol 200mg , calories 30% from fat and 7% or less from saturated fats, daily to have 5 or more servings of fruits and vegetables.  Biometrics:  Pre Biometrics - 09/06/16 1153      Pre Biometrics   Height 5\' 9"  (1.753 m)   Weight 189 lb 6 oz (85.9  kg)   Waist Circumference 38 inches   Hip Circumference 37.5 inches   Waist to Hip Ratio 1.01 %   BMI (Calculated) 28   Triceps Skinfold 9 mm   % Body Fat 24.6 %   Grip Strength 76.67 kg   Flexibility 11 in   Single Leg Stand 6 seconds       Nutrition Therapy Plan and Nutrition Goals:   Nutrition Discharge: Rate Your Plate Scores:   Nutrition Goals Re-Evaluation:   Nutrition Goals Discharge (Final Nutrition Goals Re-Evaluation):   Psychosocial: Target Goals: Acknowledge presence or absence of significant depression and/or stress, maximize coping skills, provide positive support system. Participant is able to verbalize types and ability to use techniques and skills needed for reducing stress and depression.  Initial Review & Psychosocial Screening:     Initial Psych Review & Screening - 09/06/16 1351      Initial Review   Current issues with Current Depression  Patient feels his is depressed because he can not do any of his ADL without SOB.      Family Dynamics   Good Support System? Yes     Barriers   Psychosocial barriers to participate in program There are no identifiable barriers or psychosocial needs.     Screening Interventions   Interventions Encouraged to exercise      Quality of Life Scores:     Quality of Life - 09/06/16 1154      Quality of Life Scores   Health/Function Pre 18.83 %   Socioeconomic Pre 27.3 %   Psych/Spiritual Pre 23 %   Family Pre 21 %   GLOBAL Pre 21.05 %      PHQ-9: Recent Review Flowsheet Data    Depression screen [facility] 2/9 09/06/2016 08/18/2016 01/18/2016   Decreased Interest 1 0 0   Down, Depressed, Hopeless 1 0 0   PHQ - 2 Score 2 0 0   Altered sleeping 3 0 -   Tired, decreased energy 3 0 -   Change in appetite 2 0 -   Feeling bad or failure about yourself  0 0 -   Trouble concentrating 1 0 -   Moving slowly or fidgety/restless 1 0 -   Suicidal thoughts 0 0 -   PHQ-9 Score 12 0 -   Difficult doing work/chores  Somewhat difficult Not difficult at all -     Interpretation of Total Score  Total Score Depression Severity:  1-4 = Minimal depression, 5-9 = Mild depression, 10-14 = Moderate depression, 15-19 = Moderately severe depression, 20-27 = Severe depression   Psychosocial Evaluation and Intervention:     Psychosocial Evaluation - 09/06/16 1352      Psychosocial Evaluation & Interventions   Interventions Stress management education;Relaxation education;Encouraged to exercise with the program and follow exercise prescription   Continue Psychosocial Services  Follow up required by staff      Psychosocial Re-Evaluation:     Psychosocial Re-Evaluation    Row Name 09/22/16 4709 10/20/16 1510           Psychosocial Re-Evaluation   Current issues with Current Depression Current Depression      Comments Patient is still considering counseling.  Patient is still considering seeing a couselor for his depression.       Expected Outcomes Patient's PHQ-9 and QOL scores will improve  at discharge.  Patient will have improved PHQ-9 and QOL scores at discharge and receiving treatment for his depression.       Interventions Encouraged to attend Pulmonary Rehabilitation for the exercise Relaxation education;Stress management education;Encouraged to attend Pulmonary Rehabilitation for the exercise      Continue Psychosocial Services  Follow up required by staff Follow up required by staff         Psychosocial Discharge (Final Psychosocial Re-Evaluation):     Psychosocial Re-Evaluation - 10/20/16 1510      Psychosocial Re-Evaluation   Current issues with Current Depression   Comments Patient is still considering seeing a couselor for his depression.    Expected Outcomes Patient will have improved PHQ-9 and QOL scores at discharge and receiving treatment for his depression.    Interventions Relaxation education;Stress management education;Encouraged to attend Pulmonary Rehabilitation for the  exercise   Continue Psychosocial Services  Follow up required by staff       Education: Education Goals: Education classes will be provided on a weekly basis, covering required topics. Participant will state understanding/return demonstration of topics presented.  Learning Barriers/Preferences:     Learning Barriers/Preferences - 09/06/16 1208      Learning Barriers/Preferences   Learning Barriers None   Learning Preferences Skilled Demonstration;Verbal Instruction;Individual Instruction;Group Instruction      Education Topics: How Lungs Work and Diseases: - Discuss the anatomy of the lungs and diseases that can affect the lungs, such as COPD.   Exercise: -Discuss the importance of exercise, FITT principles of exercise, normal and abnormal responses to exercise, and how to exercise safely.   PULMONARY REHAB OTHER RESPIRATORY from 10/13/2016 in [place]  Date  10/06/16  Educator  Nils Flack  Instruction Review Code  2- meets goals/outcomes      Environmental Irritants: -Discuss types of environmental irritants and how to limit exposure to environmental irritants.   PULMONARY REHAB OTHER RESPIRATORY from 10/13/2016 in [place]  Date  10/13/16  Educator  [place]  Instruction Review Code  2- meets goals/outcomes      Meds/Inhalers and oxygen: - Discuss respiratory medications, definition of an inhaler and oxygen, and the proper way to use an inhaler and oxygen.   Energy Saving Techniques: - Discuss methods to conserve energy and decrease shortness of breath when performing activities of daily living.    Bronchial Hygiene / Breathing Techniques: - Discuss breathing mechanics, pursed-lip breathing technique,  proper posture, effective ways to clear airways, and other functional breathing techniques   Cleaning Equipment: - Provides group verbal and written instruction about the health risks of elevated stress, cause of high  stress, and healthy ways to reduce stress.   Nutrition I: Fats: - Discuss the types of cholesterol, what cholesterol does to the body, and how cholesterol levels can be controlled.   Nutrition II: Labels: -Discuss the different components of food labels and how to read food labels.   Respiratory Infections: - Discuss the signs and symptoms of respiratory infections, ways to prevent respiratory infections, and the importance of seeking medical treatment when having a respiratory infection.   Stress I: Signs and Symptoms: - Discuss the causes of stress, how stress may lead to anxiety and depression, and ways to limit stress.   PULMONARY REHAB OTHER RESPIRATORY from 10/13/2016 in [place]  Date  09/08/16  Educator  [place]  Instruction Review Code  2- meets goals/outcomes      Stress II: Relaxation: -Discuss relaxation techniques to  limit stress.   PULMONARY REHAB OTHER RESPIRATORY from 10/13/2016 in [place]  Date  09/15/16  Educator  [place]  Instruction Review Code  2- meets goals/outcomes      Oxygen for Home/Travel: - Discuss how to prepare for travel when on oxygen and proper ways to transport and store oxygen to ensure safety.   PULMONARY REHAB OTHER RESPIRATORY from 10/13/2016 in [place]  Date  09/22/16  Educator  [place]  Instruction Review Code  2- meets goals/outcomes      Knowledge Questionnaire Score:     Knowledge Questionnaire Score - 09/06/16 1330      Knowledge Questionnaire Score   Pre Score 9/14      Core Components/Risk Factors/Patient Goals at Admission:     Personal Goals and Risk Factors at Admission - 09/06/16 1346      Core Components/Risk Factors/Patient Goals on Admission    Weight Management Yes   Intervention Weight Management/Obesity: Establish reasonable short term and long term weight goals.   Admit Weight 189 lb (85.7 kg)   Goal Weight: Short Term 184 lb (83.5 kg)    Goal Weight: Long Term 179 lb (81.2 kg)   Expected Outcomes Short Term: Continue to assess and modify interventions until short term weight is achieved;Long Term: Adherence to nutrition and physical activity/exercise program aimed toward attainment of established weight goal   Personal Goal Other Yes   Personal Goal Breath better during activities   Intervention PR 2 x week and supplement exercise at home 3 x week. He plans to join silver sneakers at [facility] to further his exercise.    Expected Outcomes Achieve personal goals.       Core Components/Risk Factors/Patient Goals Review:      Goals and Risk Factor Review    Row Name 09/22/16 0936 10/20/16 1505           Core Components/Risk Factors/Patient Goals Review   Personal Goals Review Weight Management/Obesity Weight Management/Obesity;Improve shortness of breath with ADL's      Review Patient has completed 5 sessions maintaining his weight. Will continue to monitor for progress.  Patient has completed 13 sessions gaining 4.7 lbs. He says his SOB has not improved. He hopes when he completes the program, he will see a difference in his SOB.       Expected Outcomes Patient will continue to work toward his weight loss goal. Patient will complete the program and begin to work toward his weight loss goal with improved SOB.          Core Components/Risk Factors/Patient Goals at Discharge (Final Review):      Goals and Risk Factor Review - 10/20/16 1505      Core Components/Risk Factors/Patient Goals Review   Personal Goals Review Weight Management/Obesity;Improve shortness of breath with ADL's   Review Patient has completed 13 sessions gaining 4.7 lbs. He says his SOB has not improved. He hopes when he completes the program, he will see a difference in his SOB.    Expected Outcomes Patient will complete the program and begin to work toward his weight loss goal with improved SOB.       ITP Comments:   Comments: ITP 30 Day REVIEW Pt  is making expected progress toward pulmonary rehab goals after completing 13 sessions. Recommend continued exercise, life style modification, education, and utilization of breathing techniques to increase stamina and strength and decrease shortness of breath with exertion.

## 2016-10-20 NOTE — Progress Notes (Signed)
Daily Session Note  Patient Details  Name: Caleb Butler MRN: [id number] Date of Birth: 1945-06-30 Referring Provider:     PULMONARY REHAB OTHER RESP ORIENTATION from 09/06/2016 in [place]  Referring Provider  [name]      Encounter Date: 10/20/2016  Check In:     Session Check In - 10/20/16 1330      Check-In   Location [facility]   Staff Present Aundra Dubin, RN, BSN;Drystan Reader Luther Parody, BS, EP, Exercise Physiologist   Supervising physician immediately available to respond to emergencies See telemetry face sheet for immediately available MD   Medication changes reported     No   Fall or balance concerns reported    No   Warm-up and Cool-down Performed as group-led instruction   Resistance Training Performed Yes   VAD Patient? No     Pain Assessment   Currently in Pain? No/denies   Pain Score 0-No pain   Multiple Pain Sites No      Capillary Blood Glucose: No results found for this or any previous visit (from the past 24 hour(s)).    History  Smoking Status  . Never Smoker  Smokeless Tobacco  . Never Used    Goals Met:  Independence with exercise equipment Improved SOB with ADL's Using PLB without cueing & demonstrates good technique Exercise tolerated well No report of cardiac concerns or symptoms Strength training completed today  Goals Unmet:  Not Applicable  Comments: Check out 230   Dr. Sinda Du is Medical Director for [facility].

## 2016-10-21 ENCOUNTER — Telehealth: Payer: Self-pay | Admitting: Family Medicine

## 2016-10-21 ENCOUNTER — Encounter: Payer: Self-pay | Admitting: Family Medicine

## 2016-10-21 ENCOUNTER — Ambulatory Visit (INDEPENDENT_AMBULATORY_CARE_PROVIDER_SITE_OTHER): Payer: Medicare Other | Admitting: Family Medicine

## 2016-10-21 ENCOUNTER — Ambulatory Visit (HOSPITAL_COMMUNITY)
Admission: RE | Admit: 2016-10-21 | Discharge: 2016-10-21 | Disposition: A | Discharge status: Home / Self Care | Payer: Medicare Other | Source: Ambulatory Visit | Attending: Family Medicine | Admitting: Family Medicine

## 2016-10-21 VITALS — BP 96/60 | HR 68 | Temp 97.9°F | Resp 16 | Ht 69.5 in | Wt 189.0 lb

## 2016-10-21 DIAGNOSIS — M791 Myalgia, unspecified site: Secondary | ICD-10-CM

## 2016-10-21 DIAGNOSIS — M160 Bilateral primary osteoarthritis of hip: Secondary | ICD-10-CM | POA: Diagnosis not present

## 2016-10-21 DIAGNOSIS — M2550 Pain in unspecified joint: Secondary | ICD-10-CM

## 2016-10-21 MED ORDER — PREDNISONE 20 MG PO TABS: ORAL_TABLET | ORAL | 0 refills | Status: DC

## 2016-10-21 MED ORDER — METHYLPREDNISOLONE ACETATE 80 MG/ML IJ SUSP
80.0000 mg | Freq: Once | INTRAMUSCULAR | Status: AC
  Administered 2016-10-21: 80 mg via INTRAMUSCULAR

## 2016-10-21 NOTE — Addendum Note (Signed)
Addended by: Shary Decamp B on: 10/21/2016 03:43 PM   Modules accepted: Orders

## 2016-10-21 NOTE — Telephone Encounter (Signed)
Patient aware of results via Dr. Dennard Schaumann

## 2016-10-21 NOTE — Progress Notes (Signed)
Subjective:    Patient ID: Caleb Butler, male    DOB: September 02, 1945, 71 y.o.   MRN: [id number]  Medication Refill   11/30/15 He is here to establish care.  PMH is significant for DMII, hypothyroidism, HLD.  He recently started prednisone for sciatica under the care of orthopedist.  Colonosocpy was 2013.  Overdue for prostate exam, hep c test, pneumovax, tdap, and zostavax.  He has no concerns.  AT that time, my plan was: We will check HgA1c and urine microalbumin.  Check LDL and goal is less than 100.  CHeck TSh.  Screen for hep c.  Check PSA.  Colonosocopy up to date.  Receved Tdap today.  Defers pneumovax and Zostavax until next visit.  01/18/16 Patient reports progressive shortness of breath and dyspnea on exertion. He states this is been going on for last 15 years steadily worsening. He reports having had a nuclear stress test from his previous cardiologist that was reportedly normal 2 years ago. He denies any echocardiogram. He denies any orthopnea or paroxysmal nocturnal dyspnea. However he gets easily winded just walking outside and feet his horses. He does have a history of working around large quantities of his spasticity for many years growing up a lot of exposure to agent orange in [place]. Patient was recently referred to a pulmonologist who performed pulmonary function tests which are listed in [place]. Prebronchodilator FEV1 to FVC ratio 75%. Postbronchodilator FEV1 to FVC ratio was 82% both of which are normal. However he had diminished lung volumes of 65% in both FEV1 and FVC. This suggests possible interstitial lung disease. I see no chest x-ray or CT scans performed of his lungs.  At that time, my plan was: CBC in July revealed no evidence of anemia. I will obtain previous workup performed by his cardiologist. If necessary, I'll perform an echocardiogram to evaluate for systolic or diastolic heart failure however his symptoms do not sound compatible with this. Rather I am concerned about  interstitial lung disease given his diminished lung volumes. I will obtain a CT scan of the chest to evaluate further and will likely consult pulmonology for evaluation.  08/18/16 Patient saw a pulmonologist and pulmonary workup was unremarkable aside from some scar tissue which was minimal. Was referred to cardiology. Catheterization revealed ostial stenosis in the left circumflex that was not amenable to PCI as well as a lesion in the ramus. He is being managed medically with Imdur for, Crestor, etc. He is currently taking metformin for diabetes. Patient states that his blood sugars are all less than 160. He denies any hypoglycemia. He is starting an exercise program. He denies any chest pain. Shortness of breath is stable. He denies any polyuria, polydipsia, or blurry vision. He denies any myalgias or right upper quadrant pain on Crestor. He did believe that he saw some memory loss on simvastatin. He switch to Crestor probably one week ago. At the present time he denies any side effects from Crestor.  At that time, my plan was: Blood pressure is controlled. We'll check a TSH TO ENSURE APPROPRIATE DOSE OF LEVOTHYROXINE.   Given the fact the patient  Has known coronary artery disease, I would switch from metformin to jardiance given the data on CV mortality reduction.  He would be an excellent patient for this.  Check hemoglobin A1c and if appropriate, DC metformin and replace with jardiance 25 mg poqday.    10/10/16 Ever since the patient started jardiance, the patient reports frequent polyuria. He states he  is having to go to the bathroom 5 or 6 times a day. He frequent has to wake up at night to the bathroom. However his blood sugars all ranged between 101 130. His hemoglobin A1c prior to switching from metformin to the new medication was 6.4 indicating adequate control. He is frustrated by the polyuria. He denies any dysuria, urinary incontinence, hematuria, or weak stream. About 6 weeks ago he also  developed diffuse myalgias. They involve both shoulders, both biceps, both triceps, and his hips and thighs. His been bitten by many ticks this spring and is possible he may have gotten a tickborne illness. However the muscle aches also started around the time he started Crestor.  At that time, my plan was: Discontinue jardiance and if the symptoms improve, switch the patient back to metformin. I also recommended he temporarily discontinue Crestor will check a CBC, CMP, CK, sedimentation rate, and Lyme titers. Recheck next week. If the myalgias improve, consider starting the patient back on a lower dose statin and gradually increasing.  10/14/16 Polyuria is already improving after discontinuing jardiance.  However the diffuse muscle aches are no better after stopping Crestor. It is only been 4 days. He continues complain of pain in his shoulders and in his hips as well as his arms and upper legs. EMR is certainly on the differential diagnosis. His most recent lab work as listed below. I'm reassured by his normal sedimentation rate. Lyme test is still pending.  At that time, my plan was: I still believe this is from the statins. I do not believe that he is has sufficient time for the medication to fully leave his system. I have asked for 1 week. If in 1 week, the myalgias are no better, I will consider putting the patient on prednisone for possible PMR. I believe this is less likely given his normal sedimentation rate however. His CK levels are normal ruling out myositis for the most part. I will also follow-up on the Lyme titer in one week. If negative, consider treatment for PMR if the myalgias are not improving.  10/21/16 Patient is no better. He continues to complain of severe pain in both shoulders, both triceps, both biceps, as well as both hamstrings quads and gluteus muscles. Lab work was negative for Lyme disease. He did not meet CDC criteria as he only had 2 out of the 10 proteins positive. No visits  with results within 1 Week(s) from this visit.  Latest known visit with results is:  Office Visit on 10/10/2016  Component Date Value Ref Range Status  . WBC 10/10/2016 9.3  3.8 - 10.8 K/uL Final  . RBC 10/10/2016 5.17  4.20 - 5.80 MIL/uL Final  . Hemoglobin 10/10/2016 16.5  13.0 - 17.0 g/dL Final  . HCT 10/10/2016 48.5  38.5 - 50.0 % Final  . MCV 10/10/2016 93.8  80.0 - 100.0 fL Final  . MCH 10/10/2016 31.9  27.0 - 33.0 pg Final  . MCHC 10/10/2016 34.0  32.0 - 36.0 g/dL Final  . RDW 10/10/2016 14.0  11.0 - 15.0 % Final  . Platelets 10/10/2016 236  140 - 400 K/uL Final  . MPV 10/10/2016 10.7  7.5 - 12.5 fL Final  . Neutro Abs 10/10/2016 6417  1,500 - 7,800 cells/uL Final  . Lymphs Abs 10/10/2016 1860  850 - 3,900 cells/uL Final  . Monocytes Absolute 10/10/2016 651  200 - 950 cells/uL Final  . Eosinophils Absolute 10/10/2016 279  15 - 500 cells/uL Final  .  Basophils Absolute 10/10/2016 93  0 - 200 cells/uL Final  . Neutrophils Relative % 10/10/2016 69  % Final  . Lymphocytes Relative 10/10/2016 20  % Final  . Monocytes Relative 10/10/2016 7  % Final  . Eosinophils Relative 10/10/2016 3  % Final  . Basophils Relative 10/10/2016 1  % Final  . Smear Review 10/10/2016 Criteria for review not met   Final  . Sodium 10/10/2016 140  135 - 146 mmol/L Final  . Potassium 10/10/2016 4.3  3.5 - 5.3 mmol/L Final  . Chloride 10/10/2016 103  98 - 110 mmol/L Final  . CO2 10/10/2016 23  20 - 31 mmol/L Final  . Glucose, Bld 10/10/2016 133* 70 - 99 mg/dL Final  . BUN 10/10/2016 19  7 - 25 mg/dL Final  . Creat 10/10/2016 1.37* 0.70 - 1.18 mg/dL Final   Comment:   For patients > or = 70 years of age: The upper reference limit for Creatinine is approximately 13% higher for people identified as African-American.     . Total Bilirubin 10/10/2016 0.6  0.2 - 1.2 mg/dL Final  . Alkaline Phosphatase 10/10/2016 57  40 - 115 U/L Final  . AST 10/10/2016 22  10 - 35 U/L Final  . ALT 10/10/2016 23  9 - 46 U/L  Final  . Total Protein 10/10/2016 7.2  6.1 - 8.1 g/dL Final  . Albumin 10/10/2016 4.6  3.6 - 5.1 g/dL Final  . Calcium 10/10/2016 9.7  8.6 - 10.3 mg/dL Final  . GFR, Est African American 10/10/2016 60  >=60 mL/min Final  . GFR, Est Non African American 10/10/2016 52* >=60 mL/min Final  . Sed Rate 10/10/2016 1  0 - 20 mm/hr Final  . Total CK 10/10/2016 103  44 - 196 U/L Final   Comment: ** Please note change in reference range(s). **     . B burgdorferi IgG Abs (IB) 10/10/2016 Negative  NEGATIVE Final  . Lyme Disease 18 kD IgG 10/10/2016 NON-REACTIVE   Final  . Lyme Disease 23 kD IgG 10/10/2016 NON-REACTIVE   Final  . Lyme Disease 28 kD IgG 10/10/2016 NON-REACTIVE   Final  . Lyme Disease 30 kD IgG 10/10/2016 NON-REACTIVE   Final  . Lyme Disease 39 kD IgG 10/10/2016 NON-REACTIVE   Final  . Lyme Disease 41 kD IgG 10/10/2016 REACTIVE*  Final  . Lyme Disease 45 kD IgG 10/10/2016 NON-REACTIVE   Final  . Lyme Disease 58 kD IgG 10/10/2016 NON-REACTIVE   Final  . Lyme Disease 66 kD IgG 10/10/2016 REACTIVE*  Final  . Lyme Disease 93 kD IgG 10/10/2016 NON-REACTIVE   Final  . B burgdorferi IgM Abs (IB) 10/10/2016 Negative  NEGATIVE Final  . Lyme Disease 23 kD IgM 10/10/2016 NON-REACTIVE   Final  . Lyme Disease 39 kD IgM 10/10/2016 NON-REACTIVE   Final  . Lyme Disease 41 kD IgM 10/10/2016 NON-REACTIVE   Final   Comment: As per CDC criteria, a Lyme disease IgG Immunoblot must show reactivity to at least 5 of 10 specific borrelial proteins to be considered positive; similarly, a  positive Lyme disease IgM immunoblot requires reactivity to 2 of 3 specific borrelial proteins. Although considered negative, IgG reactivity to fewer specific borrelial proteins or IgM reactivity to only 1 protein may indicate recent B. burgdorferi infection and warrant testing of a later sample. A positive IgM but negative IgG result obtained more than a month after onset of symptoms likely represents a  false- positive IgM result rather than acute  Lyme disease. In rare instances, Lyme disease immunoblot reactivity may represent antibodies induced by exposure to other spirochetes.      Past Medical History:  Diagnosis Date  . Hiatal hernia   . Hypothyroidism   . Type 2 diabetes mellitus ([place])    Past Surgical History:  Procedure Laterality Date  . Arm surgery Right   . CARDIAC CATHETERIZATION    . FINGER SURGERY     Left right finger reattached.   Marland Kitchen RIGHT/LEFT HEART CATH AND CORONARY ANGIOGRAPHY N/A 07/07/2016   Procedure: Right/Left Heart Cath and Coronary Angiography;  Surgeon: Jolaine Artist, MD;  Location: [facility];  Service: Cardiovascular;  Laterality: N/A;   Current Outpatient Prescriptions on File Prior to Visit  Medication Sig Dispense Refill  . acetaminophen (TYLENOL) 325 MG tablet Take 650 mg by mouth every 4 (four) hours as needed.    Marland Kitchen aspirin 81 MG EC tablet Take 81 mg by mouth at bedtime.     . Glucose Blood (BLOOD GLUCOSE TEST STRIPS) STRP     . glucose blood test strip Check BS BID Dx: E11.9 100 each 5  . isosorbide mononitrate (IMDUR) 30 MG 24 hr tablet Take 1 tablet (30 mg total) by mouth daily. 90 tablet 3  . levothyroxine (SYNTHROID, LEVOTHROID) 100 MCG tablet Take 1 tablet (100 mcg total) by mouth daily before breakfast. 90 tablet 3  . Multiple Vitamin (MULTIVITAMIN WITH MINERALS) TABS tablet Take 1 tablet by mouth daily.    . vitamin B-12 (CYANOCOBALAMIN) 1000 MCG tablet Take 1,000 mcg by mouth daily.    . empagliflozin (JARDIANCE) 25 MG TABS tablet Take 25 mg by mouth daily. (Patient not taking: Reported on 10/10/2016) 30 tablet 5  . rosuvastatin (CRESTOR) 20 MG tablet Take 1 tablet (20 mg total) by mouth daily. (Patient not taking: Reported on 10/14/2016) 30 tablet 6   No current facility-administered medications on file prior to visit.    Allergies  Allergen Reactions  . Bee Venom Swelling  . Ceclor [Cefaclor] Hives  . Shellfish Allergy  Hives    Pt states it is questionable// he eats seafood still without problem.   Social History   Social History  . Marital status: Married    Spouse name: N/A  . Number of children: N/A  . Years of education: N/A   Occupational History  . Not on file.   Social History Main Topics  . Smoking status: Never Smoker  . Smokeless tobacco: Never Used  . Alcohol use 0.6 oz/week    1 Cans of beer per week     Comment: occ  . Drug use: No  . Sexual activity: Not on file   Other Topics Concern  . Not on file   Social History Narrative   Retired. Lives with wife, [name].    Family History  Problem Relation Age of Onset  . Diabetes Mellitus II Father   . Heart failure Father   . Kidney failure Father       Review of Systems  All other systems reviewed and are negative.      Objective:   Physical Exam  Constitutional: He is oriented to person, place, and time. He appears well-developed and well-nourished. No distress.  HENT:  Head: Normocephalic and atraumatic.  Right Ear: External ear normal.  Left Ear: External ear normal.  Nose: Nose normal.  Mouth/Throat: Oropharynx is clear and moist. No oropharyngeal exudate.  Eyes: Conjunctivae and EOM are normal. Pupils are equal, round, and reactive  to light. Right eye exhibits no discharge. Left eye exhibits no discharge. No scleral icterus.  Neck: Normal range of motion. Neck supple. No JVD present. No tracheal deviation present. No thyromegaly present.  Cardiovascular: Normal rate, regular rhythm, normal heart sounds and intact distal pulses.  Exam reveals no gallop and no friction rub.   No murmur heard. Pulmonary/Chest: Effort normal and breath sounds normal. No stridor. No respiratory distress. He has no wheezes. He has no rales. He exhibits no tenderness.  Abdominal: Soft. Bowel sounds are normal. He exhibits no distension and no mass. There is no tenderness. There is no rebound and no guarding.  Musculoskeletal: He  exhibits tenderness.       Right shoulder: He exhibits decreased range of motion.       Left shoulder: He exhibits decreased range of motion.  Lymphadenopathy:    He has no cervical adenopathy.  Neurological: He is alert and oriented to person, place, and time. He has normal reflexes. No cranial nerve deficit. He exhibits normal muscle tone. Coordination normal.  Skin: Skin is warm. No rash noted. He is not diaphoretic. No erythema. No pallor.  Vitals reviewed.         Assessment & Plan:  Polyarthralgia - Plan: DG Shoulder Left, DG Shoulder Right, DG HIPS BILAT WITH PELVIS 3-4 VIEWS, predniSONE (DELTASONE) 20 MG tablet  Myalgia Resume Crestor. I believe this has to be PMR. Patient received 80 mg of Depo-Medrol and will start a prednisone taper pack tomorrow and then we will reassess Tuesday. If pain is improving, consult rheumatology. I will obtain x-rays of the hips, pelvis, and shoulders to rule out skeletal malignancies such as bone metastasis from an unknown primary. If the patient is no better by Tuesday, consider lab workup for multiple myeloma and a rheumatology consult for a second opinion.

## 2016-10-21 NOTE — Telephone Encounter (Signed)
Imaging results.

## 2016-10-25 ENCOUNTER — Encounter (HOSPITAL_COMMUNITY)
Admission: RE | Admit: 2016-10-25 | Discharge: 2016-10-25 | Disposition: A | Discharge status: Home / Self Care | Payer: Medicare Other | Source: Ambulatory Visit | Attending: Internal Medicine | Admitting: Internal Medicine

## 2016-10-25 ENCOUNTER — Telehealth: Payer: Self-pay | Admitting: Family Medicine

## 2016-10-25 DIAGNOSIS — M353 Polymyalgia rheumatica: Secondary | ICD-10-CM

## 2016-10-25 DIAGNOSIS — R0600 Dyspnea, unspecified: Secondary | ICD-10-CM

## 2016-10-25 DIAGNOSIS — R0689 Other abnormalities of breathing: Principal | ICD-10-CM

## 2016-10-25 MED ORDER — METFORMIN HCL 1000 MG PO TABS: 1000.0000 mg | ORAL_TABLET | Freq: Two times a day (BID) | ORAL | 3 refills | Status: DC

## 2016-10-25 MED ORDER — PREDNISONE 10 MG PO TABS: 15.0000 mg | ORAL_TABLET | Freq: Every day | ORAL | 0 refills | Status: DC

## 2016-10-25 NOTE — Telephone Encounter (Signed)
Patient is calling to let you know that the shot he got Friday is working and he feels better, wants to know what the next step would be  [phone] (M)

## 2016-10-25 NOTE — Telephone Encounter (Signed)
Begin prednisone taper gradually, start 15 mg a day for 2 weeks, resume metformin and consult rheumatology.

## 2016-10-25 NOTE — Progress Notes (Signed)
Daily Session Note  Patient Details  Name: Caleb Butler MRN: [id number] Date of Birth: 12/25/1945 Referring Provider:     PULMONARY REHAB OTHER RESP ORIENTATION from 09/06/2016 in [facility]  Referring Provider  [name]      Encounter Date: 10/25/2016  Check In:     Session Check In - 10/25/16 1056      Check-In   Location [facility]   Staff Present Russella Dar, MS, EP, [facility], Exercise Physiologist;Alamin Mccuiston Luther Parody, BS, EP, Exercise Physiologist   Supervising physician immediately available to respond to emergencies See telemetry face sheet for immediately available MD   Medication changes reported     No   Fall or balance concerns reported    No   Warm-up and Cool-down Performed as group-led instruction   Resistance Training Performed Yes   VAD Patient? No     Pain Assessment   Currently in Pain? No/denies   Pain Score 0-No pain   Multiple Pain Sites No      Capillary Blood Glucose: No results found for this or any previous visit (from the past 24 hour(s)).    History  Smoking Status  . Never Smoker  Smokeless Tobacco  . Never Used    Goals Met:  Independence with exercise equipment Improved SOB with ADL's Using PLB without cueing & demonstrates good technique Exercise tolerated well No report of cardiac concerns or symptoms Strength training completed today  Goals Unmet:  Not Applicable  Comments: Check out 1145   Dr. Sinda Du is Medical Director for [facility].

## 2016-10-25 NOTE — Telephone Encounter (Signed)
Patient aware of providers recommendations and med sent to pharm. Referral placed.

## 2016-10-27 ENCOUNTER — Encounter (HOSPITAL_COMMUNITY)
Admission: RE | Admit: 2016-10-27 | Discharge: 2016-10-27 | Disposition: A | Discharge status: Home / Self Care | Payer: Medicare Other | Source: Ambulatory Visit | Attending: Internal Medicine | Admitting: Internal Medicine

## 2016-10-27 ENCOUNTER — Encounter (HOSPITAL_COMMUNITY): Payer: Medicare Other

## 2016-10-27 DIAGNOSIS — R0600 Dyspnea, unspecified: Secondary | ICD-10-CM | POA: Diagnosis not present

## 2016-10-28 ENCOUNTER — Encounter: Payer: Self-pay | Admitting: Family Medicine

## 2016-11-01 ENCOUNTER — Encounter (HOSPITAL_COMMUNITY)
Admission: RE | Admit: 2016-11-01 | Discharge: 2016-11-01 | Disposition: A | Discharge status: Home / Self Care | Payer: Medicare Other | Source: Ambulatory Visit | Attending: Internal Medicine | Admitting: Internal Medicine

## 2016-11-01 DIAGNOSIS — R0600 Dyspnea, unspecified: Secondary | ICD-10-CM | POA: Diagnosis not present

## 2016-11-01 DIAGNOSIS — E119 Type 2 diabetes mellitus without complications: Secondary | ICD-10-CM | POA: Diagnosis not present

## 2016-11-01 DIAGNOSIS — R0689 Other abnormalities of breathing: Secondary | ICD-10-CM

## 2016-11-01 DIAGNOSIS — E039 Hypothyroidism, unspecified: Secondary | ICD-10-CM | POA: Diagnosis not present

## 2016-11-01 NOTE — Progress Notes (Signed)
Daily Session Note  Patient Details  Name: Caleb Butler MRN: [id number] Date of Birth: 10/23/1945 Referring Provider:     PULMONARY REHAB OTHER RESP ORIENTATION from 09/06/2016 in [place]  Referring Provider  [name]      Encounter Date: 11/01/2016  Check In:     Session Check In - 11/01/16 1042      Check-In   Location [facility]   Staff Present Russella Dar, MS, EP, [facility], Exercise Physiologist;Emmily Pellegrin Luther Parody, BS, EP, Exercise Physiologist   Supervising physician immediately available to respond to emergencies See telemetry face sheet for immediately available MD   Medication changes reported     No   Fall or balance concerns reported    No   Warm-up and Cool-down Performed as group-led instruction   Resistance Training Performed Yes   VAD Patient? No     Pain Assessment   Currently in Pain? No/denies   Pain Score 0-No pain   Multiple Pain Sites No      Capillary Blood Glucose: No results found for this or any previous visit (from the past 24 hour(s)).    History  Smoking Status  . Never Smoker  Smokeless Tobacco  . Never Used    Goals Met:  Independence with exercise equipment Improved SOB with ADL's Using PLB without cueing & demonstrates good technique Exercise tolerated well No report of cardiac concerns or symptoms Strength training completed today  Goals Unmet:  Not Applicable  Comments: Check out 1145   Dr. Sinda Du is Medical Director for [facility].

## 2016-11-03 ENCOUNTER — Encounter (HOSPITAL_COMMUNITY): Payer: Medicare Other

## 2016-11-08 ENCOUNTER — Encounter (HOSPITAL_COMMUNITY)
Admission: RE | Admit: 2016-11-08 | Discharge: 2016-11-08 | Disposition: A | Discharge status: Home / Self Care | Payer: Medicare Other | Source: Ambulatory Visit | Attending: Internal Medicine | Admitting: Internal Medicine

## 2016-11-08 DIAGNOSIS — R0600 Dyspnea, unspecified: Secondary | ICD-10-CM | POA: Diagnosis not present

## 2016-11-08 DIAGNOSIS — R0689 Other abnormalities of breathing: Principal | ICD-10-CM

## 2016-11-08 NOTE — Progress Notes (Signed)
Daily Session Note  Patient Details  Name: EFFREY DAVIDOW MRN: [id number] Date of Birth: 07/21/1945 Referring Provider:     PULMONARY REHAB OTHER RESP ORIENTATION from 09/06/2016 in [place]  Referring Provider  [name]      Encounter Date: 11/08/2016  Check In:     Session Check In - 11/08/16 1344      Check-In   Location [facility]   Staff Present Russella Dar, MS, EP, [facility], Exercise Physiologist;Milam Allbaugh Luther Parody, BS, EP, Exercise Physiologist   Supervising physician immediately available to respond to emergencies See telemetry face sheet for immediately available MD   Medication changes reported     No   Fall or balance concerns reported    No   Warm-up and Cool-down Performed as group-led instruction   Resistance Training Performed Yes   VAD Patient? No     Pain Assessment   Currently in Pain? No/denies   Pain Score 0-No pain   Multiple Pain Sites No      Capillary Blood Glucose: No results found for this or any previous visit (from the past 24 hour(s)).    History  Smoking Status  . Never Smoker  Smokeless Tobacco  . Never Used    Goals Met:  Independence with exercise equipment Improved SOB with ADL's Using PLB without cueing & demonstrates good technique Exercise tolerated well No report of cardiac concerns or symptoms Strength training completed today  Goals Unmet:  Not Applicable  Comments: Check out 230   Dr. Sinda Du is Medical Director for [facility].

## 2016-11-10 ENCOUNTER — Encounter (HOSPITAL_COMMUNITY)
Admission: RE | Admit: 2016-11-10 | Discharge: 2016-11-10 | Disposition: A | Discharge status: Home / Self Care | Payer: Medicare Other | Source: Ambulatory Visit | Attending: Internal Medicine | Admitting: Internal Medicine

## 2016-11-14 NOTE — Progress Notes (Signed)
Pulmonary Individual Treatment Plan  Patient Details  Name: Caleb Butler MRN: [id number] Date of Birth: 10/30/1945 Referring Provider:     PULMONARY REHAB OTHER RESP ORIENTATION from 09/06/2016 in [place]  Referring Provider  [name]      Initial Encounter Date:    [place] from 09/06/2016 in [place]  Date  09/06/16  Referring Provider  [name]      Visit Diagnosis: Dyspnea and respiratory abnormalities  Patient's Home Medications on Admission:   Current Outpatient Prescriptions:  .  acetaminophen (TYLENOL) 325 MG tablet, Take 650 mg by mouth every 4 (four) hours as needed., Disp: , Rfl:  .  aspirin 81 MG EC tablet, Take 81 mg by mouth at bedtime. , Disp: , Rfl:  .  empagliflozin (JARDIANCE) 25 MG TABS tablet, Take 25 mg by mouth daily. (Patient not taking: Reported on 10/10/2016), Disp: 30 tablet, Rfl: 5 .  Glucose Blood (BLOOD GLUCOSE TEST STRIPS) STRP, , Disp: , Rfl:  .  glucose blood test strip, Check BS BID Dx: E11.9, Disp: 100 each, Rfl: 5 .  isosorbide mononitrate (IMDUR) 30 MG 24 hr tablet, Take 1 tablet (30 mg total) by mouth daily., Disp: 90 tablet, Rfl: 3 .  levothyroxine (SYNTHROID, LEVOTHROID) 100 MCG tablet, Take 1 tablet (100 mcg total) by mouth daily before breakfast., Disp: 90 tablet, Rfl: 3 .  metFORMIN (GLUCOPHAGE) 1000 MG tablet, Take 1 tablet (1,000 mg total) by mouth 2 (two) times daily with a meal., Disp: 180 tablet, Rfl: 3 .  Multiple Vitamin (MULTIVITAMIN WITH MINERALS) TABS tablet, Take 1 tablet by mouth daily., Disp: , Rfl:  .  predniSONE (DELTASONE) 10 MG tablet, Take 1.5 tablets (15 mg total) by mouth daily with breakfast. X 2 weeks, Disp: 21 tablet, Rfl: 0 .  rosuvastatin (CRESTOR) 20 MG tablet, Take 1 tablet (20 mg total) by mouth daily. (Patient not taking: Reported on 10/14/2016), Disp: 30 tablet, Rfl: 6 .  vitamin B-12 (CYANOCOBALAMIN) 1000 MCG tablet, Take  1,000 mcg by mouth daily., Disp: , Rfl:   Past Medical History: Past Medical History:  Diagnosis Date  . CAD (coronary artery disease)    80% ostial lesion in L circumflex (2017)  . Hiatal hernia   . Hypothyroidism   . PMR (polymyalgia rheumatica) (HCC)   . Type 2 diabetes mellitus (HCC)     Tobacco Use: History  Smoking Status  . Never Smoker  Smokeless Tobacco  . Never Used    Labs: Recent Review Flowsheet Data    Labs for ITP Cardiac and Pulmonary Rehab Latest Ref Rng & Units 11/30/2015 07/07/2016 07/07/2016 07/07/2016 08/18/2016   Cholestrol 125 - 200 mg/dL 140 - - - -   LDLCALC <130 mg/dL 57 - - - -   HDL >=40 mg/dL 41 - - - -   Trlycerides <150 mg/dL 210(H) - - - -   Hemoglobin A1c <5.7 % 7.3(H) - - - 6.4(H)   PHART 7.350 - 7.450 - 7.382 - - -   PCO2ART 32.0 - 48.0 mmHg - 40.7 - - -   HCO3 20.0 - 28.0 mmol/L - 24.2 21.2 24.1 -   TCO2 0 - 100 mmol/L - '25 22 25 '$ -   ACIDBASEDEF 0.0 - 2.0 mmol/L - 1.0 5.0(H) 2.0 -   O2SAT % - 95.0 68.0 69.0 -      Capillary Blood Glucose: Lab Results  Component Value Date   GLUCAP 205 (H) 09/08/2016  GLUCAP 97 07/07/2016   GLUCAP 148 (H) 07/07/2016     ADL UCSD:     Pulmonary Assessment Scores    Row Name 09/06/16 1336         ADL UCSD   ADL Phase Entry     SOB Score total 56     Rest 1     Walk 8     Stairs 4     Bath 1     Dress 2     Shop 2       CAT Score   CAT Score 22       mMRC Score   mMRC Score 3        Pulmonary Function Assessment:     Pulmonary Function Assessment - 09/06/16 1332      Pulmonary Function Tests   FVC% 63 %   FEV1% 69 %   FEV1/FVC Ratio 108   DLCO% 66 %     Initial Spirometry Results   FVC% 64 %   FEV1% 65 %   FEV1/FVC Ratio 75     Post Bronchodilator Spirometry Results   FVC% 63 %   FEV1% 69 %   FEV1/FVC Ratio 81     Breath   Bilateral Breath Sounds Clear   Shortness of Breath Yes  SOB with exertion      Exercise Target Goals:    Exercise Program  Goal: Individual exercise prescription set with THRR, safety & activity barriers. Participant demonstrates ability to understand and report RPE using BORG scale, to self-measure pulse accurately, and to acknowledge the importance of the exercise prescription.  Exercise Prescription Goal: Starting with aerobic activity 30 plus minutes a day, 3 days per week for initial exercise prescription. Provide home exercise prescription and guidelines that participant acknowledges understanding prior to discharge.  Activity Barriers & Risk Stratification:   6 Minute Walk:     6 Minute Walk    Row Name 09/06/16 1152         6 Minute Walk   Phase Initial     Distance 1400 feet     Distance % Change 0 %     Walk Time 6 minutes     # of Rest Breaks 0     MPH 2.65     METS 3.03     RPE 9     Perceived Dyspnea  13     VO2 Peak 10.01     Symptoms No     Resting HR 83 bpm     Resting BP 100/74     Max Ex. HR 89 bpm     Max Ex. BP 114/80     2 Minute Post BP 102/76        Oxygen Initial Assessment:     Oxygen Initial Assessment - 09/06/16 1345      Home Oxygen   Home Oxygen Device None   Sleep Oxygen Prescription None   Home Exercise Oxygen Prescription None   Home at Rest Exercise Oxygen Prescription None     Initial 6 min Walk   Oxygen Used None     Program Oxygen Prescription   Program Oxygen Prescription None      Oxygen Re-Evaluation:   Oxygen Discharge (Final Oxygen Re-Evaluation):   Initial Exercise Prescription:     Initial Exercise Prescription - 09/06/16 1100      Date of Initial Exercise RX and Referring Provider   Date 09/06/16   Referring Provider [name]  Treadmill   MPH 2   Grade 0   Minutes 15   METs 2.5     Recumbant Elliptical   Level 1   RPM 37   Watts 37   Minutes 20   METs 2.2     Prescription Details   Frequency (times per week) 2   Duration Progress to 30 minutes of continuous aerobic without signs/symptoms of physical  distress     Intensity   THRR 40-80% of Max Heartrate [phone]   Ratings of Perceived Exertion 11-13   Perceived Dyspnea 0-4     Progression   Progression Continue progressive overload as per policy without signs/symptoms or physical distress.     Resistance Training   Training Prescription Yes   Weight 1   Reps 10-15      Perform Capillary Blood Glucose checks as needed.  Exercise Prescription Changes:     Exercise Prescription Changes    Row Name 09/19/16 1400 10/14/16 1400 10/18/16 1200 11/01/16 1100 11/10/16 0800     Response to Exercise   Blood Pressure (Admit) 110/60 98/60 102/54 100/52 102/66   Blood Pressure (Exercise) 134/72 118/66 108/60 116/64 120/58   Blood Pressure (Exit) 100/64 92/56 104/62 106/60 98/60   Heart Rate (Admit) 86 bpm 81 bpm 74 bpm 80 bpm 86 bpm   Heart Rate (Exercise) 98 bpm 89 bpm 81 bpm 87 bpm 86 bpm   Heart Rate (Exit) 79 bpm 85 bpm 73 bpm 87 bpm 96 bpm   Oxygen Saturation (Admit) 94 % 95 % 94 % 97 % 96 %   Oxygen Saturation (Exercise) 96 % 96 % 97 % 96 % 95 %   Oxygen Saturation (Exit) 96 % 96 % 96 % 95 % 96 %   Rating of Perceived Exertion (Exercise) 11 10 11 11 12    Perceived Dyspnea (Exercise) 11 10 11 11 12    Duration Progress to 30 minutes of  aerobic without signs/symptoms of physical distress Progress to 30 minutes of  aerobic without signs/symptoms of physical distress Progress to 30 minutes of  aerobic without signs/symptoms of physical distress Progress to 30 minutes of  aerobic without signs/symptoms of physical distress Progress to 30 minutes of  aerobic without signs/symptoms of physical distress   Intensity THRR unchanged THRR unchanged THRR unchanged THRR unchanged THRR unchanged     Progression   Progression Continue to progress workloads to maintain intensity without signs/symptoms of physical distress. Continue to progress workloads to maintain intensity without signs/symptoms of physical distress. Continue to progress  workloads to maintain intensity without signs/symptoms of physical distress. Continue to progress workloads to maintain intensity without signs/symptoms of physical distress. Continue to progress workloads to maintain intensity without signs/symptoms of physical distress.     Resistance Training   Training Prescription Yes Yes Yes Yes Yes   Weight 4 4 4 4 4    Reps 10-15 10-15 10-15 10-15 10-15     Treadmill   MPH 2.5 2.5 2.5 2.7 2.6   Grade 0 0 0 0 0.5   Minutes 15 15 15 15 20    METs 4.06 4.06 4.06 4.2 3.1     Recumbant Elliptical   Level 2 2 3 3 3    RPM 49 49 61 58 54   Watts 69 69 80 73 68   Minutes 20 20 20 20 20    METs 3.5 3.5 4.6 3 3.6     Home Exercise Plan   Plans to continue exercise at Home (comment) Home (comment) Home (  comment) Home (comment) Home (comment)   Frequency Add 2 additional days to program exercise sessions. Add 2 additional days to program exercise sessions. Add 2 additional days to program exercise sessions. Add 2 additional days to program exercise sessions. Add 2 additional days to program exercise sessions.      Exercise Comments:     Exercise Comments    Row Name 09/19/16 1427 10/18/16 1249 11/01/16 1141 11/10/16 0823     Exercise Comments Patient is very determined and is progressing well in PR.  Patient is progressing well in PR.  Patient is doing well in PR.  Patient is doing well in PR.        Exercise Goals and Review:     Exercise Goals    Row Name 09/06/16 1345             Exercise Goals   Increase Physical Activity Yes       Intervention Provide advice, education, support and counseling about physical activity/exercise needs.;Develop an individualized exercise prescription for aerobic and resistive training based on initial evaluation findings, risk stratification, comorbidities and participant's personal goals.       Expected Outcomes Achievement of increased cardiorespiratory fitness and enhanced flexibility, muscular endurance  and strength shown through measurements of functional capacity and personal statement of participant.       Increase Strength and Stamina Yes       Intervention Provide advice, education, support and counseling about physical activity/exercise needs.;Develop an individualized exercise prescription for aerobic and resistive training based on initial evaluation findings, risk stratification, comorbidities and participant's personal goals.       Expected Outcomes Achievement of increased cardiorespiratory fitness and enhanced flexibility, muscular endurance and strength shown through measurements of functional capacity and personal statement of participant.          Exercise Goals Re-Evaluation :     Exercise Goals Re-Evaluation    Row Name 09/22/16 4765 10/20/16 1507 11/14/16 1351         Exercise Goal Re-Evaluation   Exercise Goals Review Increase Physical Activity;Increase Strenth and Stamina  Breathe better; get in shape.  Increase Physical Activity;Increase Strenth and Stamina Increase Physical Activity;Increase Strenth and Stamina     Comments After completing 5 sessions, patient has had some progression. Will continue to monitor.  Patient has completed 13 sessions. Eventhough the he has had some progression, he does not feel like his strength and stamina have increased. He hopes this will change after completing the program. 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  After completing 19 sessions, patient is progressing. He says he can not tell a difference in his strength yet but hopes it will improve as he continues the program.      Expected Outcomes Patient will continue to work toward his goal to beathe better and get in shape.  Patient will complete the program with increased strenght, stamina, and activity.  Patient will complete the program with increased strength, stamina, and activity.         Discharge Exercise Prescription (Final  Exercise Prescription Changes):     Exercise Prescription Changes - 11/10/16 0800      Response to Exercise   Blood Pressure (Admit) 102/66   Blood Pressure (Exercise) 120/58   Blood Pressure (Exit) 98/60   Heart Rate (Admit) 86 bpm   Heart Rate (Exercise) 86 bpm   Heart Rate (Exit) 96 bpm   Oxygen Saturation (Admit) 96 %  Oxygen Saturation (Exercise) 95 %   Oxygen Saturation (Exit) 96 %   Rating of Perceived Exertion (Exercise) 12   Perceived Dyspnea (Exercise) 12   Duration Progress to 30 minutes of  aerobic without signs/symptoms of physical distress   Intensity THRR unchanged     Progression   Progression Continue to progress workloads to maintain intensity without signs/symptoms of physical distress.     Resistance Training   Training Prescription Yes   Weight 4   Reps 10-15     Treadmill   MPH 2.6   Grade 0.5   Minutes 20   METs 3.1     Recumbant Elliptical   Level 3   RPM 54   Watts 68   Minutes 20   METs 3.6     Home Exercise Plan   Plans to continue exercise at Home (comment)   Frequency Add 2 additional days to program exercise sessions.      Nutrition:  Target Goals: Understanding of nutrition guidelines, daily intake of sodium 1500mg , cholesterol 200mg , calories 30% from fat and 7% or less from saturated fats, daily to have 5 or more servings of fruits and vegetables.  Biometrics:     Pre Biometrics - 09/06/16 1153      Pre Biometrics   Height 5\' 9"  (1.753 m)   Weight 189 lb 6 oz (85.9 kg)   Waist Circumference 38 inches   Hip Circumference 37.5 inches   Waist to Hip Ratio 1.01 %   BMI (Calculated) 28   Triceps Skinfold 9 mm   % Body Fat 24.6 %   Grip Strength 76.67 kg   Flexibility 11 in   Single Leg Stand 6 seconds       Nutrition Therapy Plan and Nutrition Goals:   Nutrition Discharge: Rate Your Plate Scores:   Nutrition Goals Re-Evaluation:   Nutrition Goals Discharge (Final Nutrition Goals  Re-Evaluation):   Psychosocial: Target Goals: Acknowledge presence or absence of significant depression and/or stress, maximize coping skills, provide positive support system. Participant is able to verbalize types and ability to use techniques and skills needed for reducing stress and depression.  Initial Review & Psychosocial Screening:     Initial Psych Review & Screening - 09/06/16 1351      Initial Review   Current issues with Current Depression  Patient feels his is depressed because he can not do any of his ADL without SOB.      Family Dynamics   Good Support System? Yes     Barriers   Psychosocial barriers to participate in program There are no identifiable barriers or psychosocial needs.     Screening Interventions   Interventions Encouraged to exercise      Quality of Life Scores:     Quality of Life - 09/06/16 1154      Quality of Life Scores   Health/Function Pre 18.83 %   Socioeconomic Pre 27.3 %   Psych/Spiritual Pre 23 %   Family Pre 21 %   GLOBAL Pre 21.05 %      PHQ-9: Recent Review Flowsheet Data    Depression screen [facility] 2/9 09/06/2016 08/18/2016 01/18/2016   Decreased Interest 1 0 0   Down, Depressed, Hopeless 1 0 0   PHQ - 2 Score 2 0 0   Altered sleeping 3 0 -   Tired, decreased energy 3 0 -   Change in appetite 2 0 -   Feeling bad or failure about yourself  0 0 -  Trouble concentrating 1 0 -   Moving slowly or fidgety/restless 1 0 -   Suicidal thoughts 0 0 -   PHQ-9 Score 12 0 -   Difficult doing work/chores Somewhat difficult Not difficult at all -     Interpretation of Total Score  Total Score Depression Severity:  1-4 = Minimal depression, 5-9 = Mild depression, 10-14 = Moderate depression, 15-19 = Moderately severe depression, 20-27 = Severe depression   Psychosocial Evaluation and Intervention:     Psychosocial Evaluation - 09/06/16 1352      Psychosocial Evaluation & Interventions   Interventions Stress management  education;Relaxation education;Encouraged to exercise with the program and follow exercise prescription   Continue Psychosocial Services  Follow up required by staff      Psychosocial Re-Evaluation:     Psychosocial Re-Evaluation    [place] Name 09/22/16 1607 10/20/16 1510 11/14/16 1407         Psychosocial Re-Evaluation   Current issues with Current Depression Current Depression Current Depression     Comments Patient is still considering counseling.  Patient is still considering seeing a couselor for his depression.  Patient continues to have depression but is not interested in treatment at this time.      Expected Outcomes Patient's PHQ-9 and QOL scores will improve at discharge.  Patient will have improved PHQ-9 and QOL scores at discharge and receiving treatment for his depression.  Patient will have improved QOL and PHQ-9 scores or no change at discharge.      Interventions Encouraged to attend Pulmonary Rehabilitation for the exercise Relaxation education;Stress management education;Encouraged to attend Pulmonary Rehabilitation for the exercise Relaxation education;Encouraged to attend Pulmonary Rehabilitation for the exercise;Stress management education     Continue Psychosocial Services  Follow up required by staff Follow up required by staff Follow up required by staff        Psychosocial Discharge (Final Psychosocial Re-Evaluation):     Psychosocial Re-Evaluation - 11/14/16 1407      Psychosocial Re-Evaluation   Current issues with Current Depression   Comments Patient continues to have depression but is not interested in treatment at this time.    Expected Outcomes Patient will have improved QOL and PHQ-9 scores or no change at discharge.    Interventions Relaxation education;Encouraged to attend Pulmonary Rehabilitation for the exercise;Stress management education   Continue Psychosocial Services  Follow up required by staff       Education: Education Goals: Education  classes will be provided on a weekly basis, covering required topics. Participant will state understanding/return demonstration of topics presented.  Learning Barriers/Preferences:     Learning Barriers/Preferences - 09/06/16 1208      Learning Barriers/Preferences   Learning Barriers None   Learning Preferences Skilled Demonstration;Verbal Instruction;Individual Instruction;Group Instruction      Education Topics: How Lungs Work and Diseases: - Discuss the anatomy of the lungs and diseases that can affect the lungs, such as COPD.   Exercise: -Discuss the importance of exercise, FITT principles of exercise, normal and abnormal responses to exercise, and how to exercise safely.   PULMONARY REHAB OTHER RESPIRATORY from 10/20/2016 in [place]  Date  10/06/16  Educator  Nils Flack  Instruction Review Code  2- meets goals/outcomes      Environmental Irritants: -Discuss types of environmental irritants and how to limit exposure to environmental irritants.   PULMONARY REHAB OTHER RESPIRATORY from 10/20/2016 in [place]  Date  10/13/16  Educator  [place]  Instruction Review Code  2- meets goals/outcomes      Meds/Inhalers and oxygen: - Discuss respiratory medications, definition of an inhaler and oxygen, and the proper way to use an inhaler and oxygen.   PULMONARY REHAB OTHER RESPIRATORY from 10/20/2016 in [place]  Date  10/20/16  Educator  [place]  Instruction Review Code  2- meets goals/outcomes      Energy Saving Techniques: - Discuss methods to conserve energy and decrease shortness of breath when performing activities of daily living.    PULMONARY REHAB OTHER RESPIRATORY from 10/27/2016 in [place]  Date  10/27/16  Educator  Nils Flack  Instruction Review Code  2- meets goals/outcomes      Bronchial Hygiene / Breathing Techniques: - Discuss breathing mechanics, pursed-lip  breathing technique,  proper posture, effective ways to clear airways, and other functional breathing techniques   Cleaning Equipment: - Provides group verbal and written instruction about the health risks of elevated stress, cause of high stress, and healthy ways to reduce stress.   Nutrition I: Fats: - Discuss the types of cholesterol, what cholesterol does to the body, and how cholesterol levels can be controlled.   Nutrition II: Labels: -Discuss the different components of food labels and how to read food labels.   Respiratory Infections: - Discuss the signs and symptoms of respiratory infections, ways to prevent respiratory infections, and the importance of seeking medical treatment when having a respiratory infection.   Stress I: Signs and Symptoms: - Discuss the causes of stress, how stress may lead to anxiety and depression, and ways to limit stress.   PULMONARY REHAB OTHER RESPIRATORY from 10/20/2016 in [place]  Date  09/08/16  Educator  [place]  Instruction Review Code  2- meets goals/outcomes      Stress II: Relaxation: -Discuss relaxation techniques to limit stress.   PULMONARY REHAB OTHER RESPIRATORY from 10/20/2016 in [place]  Date  09/15/16  Educator  [place]  Instruction Review Code  2- meets goals/outcomes      Oxygen for Home/Travel: - Discuss how to prepare for travel when on oxygen and proper ways to transport and store oxygen to ensure safety.   PULMONARY REHAB OTHER RESPIRATORY from 10/20/2016 in [place]  Date  09/22/16  Educator  [place]  Instruction Review Code  2- meets goals/outcomes      Knowledge Questionnaire Score:     Knowledge Questionnaire Score - 09/06/16 1330      Knowledge Questionnaire Score   Pre Score 9/14      Core Components/Risk Factors/Patient Goals at Admission:     Personal Goals and Risk Factors at Admission - 09/06/16 1346      Core Components/Risk  Factors/Patient Goals on Admission    Weight Management Yes   Intervention Weight Management/Obesity: Establish reasonable short term and long term weight goals.   Admit Weight 189 lb (85.7 kg)   Goal Weight: Short Term 184 lb (83.5 kg)   Goal Weight: Long Term 179 lb (81.2 kg)   Expected Outcomes Short Term: Continue to assess and modify interventions until short term weight is achieved;Long Term: Adherence to nutrition and physical activity/exercise program aimed toward attainment of established weight goal   Personal Goal Other Yes   Personal Goal Breath better during activities   Intervention PR 2 x week and supplement exercise at home 3 x week. He plans to join silver sneakers at [facility] to further his exercise.    Expected Outcomes Achieve personal  goals.       Core Components/Risk Factors/Patient Goals Review:      Goals and Risk Factor Review    Row Name 09/22/16 0936 10/20/16 1505 11/14/16 1348         Core Components/Risk Factors/Patient Goals Review   Personal Goals Review Weight Management/Obesity Weight Management/Obesity;Improve shortness of breath with ADL's Weight Management/Obesity;Improve shortness of breath with ADL's  Breathe better; get in shape.      Review Patient has completed 5 sessions maintaining his weight. Will continue to monitor for progress.  Patient has completed 13 sessions gaining 4.7 lbs. He says his SOB has not improved. He hopes when he completes the program, he will see a difference in his SOB.  Patient has compeleted 17 sessions losing 1 lb. He is progressing in the program. He says he can not tell if he is improving or not. 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 Will continue to monitor for progress.      Expected Outcomes Patient will continue to work toward his weight loss goal. Patient will complete the program and begin to work toward his weight loss goal with improved SOB.  Patient will complete the program and work toward meeting his  personal goals.         Core Components/Risk Factors/Patient Goals at Discharge (Final Review):      Goals and Risk Factor Review - 11/14/16 1348      Core Components/Risk Factors/Patient Goals Review   Personal Goals Review Weight Management/Obesity;Improve shortness of breath with ADL's  Breathe better; get in shape.    Review Patient has compeleted 17 sessions losing 1 lb. He is progressing in the program. He says he can not tell if he is improving or not. 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 Will continue to monitor for progress.    Expected Outcomes Patient will complete the program and work toward meeting his personal goals.       ITP Comments:   Comments: ITP 30 Day REVIEW Pt is making expected progress toward pulmonary rehab goals after completing 17 sessions. Recommend continued exercise, life style modification, education, and utilization of breathing techniques to increase stamina and strength and decrease shortness of breath with exertion.

## 2016-11-15 ENCOUNTER — Encounter (HOSPITAL_COMMUNITY)
Admission: RE | Admit: 2016-11-15 | Discharge: 2016-11-15 | Disposition: A | Discharge status: Home / Self Care | Payer: Medicare Other | Source: Ambulatory Visit | Attending: Internal Medicine | Admitting: Internal Medicine

## 2016-11-15 DIAGNOSIS — R0600 Dyspnea, unspecified: Secondary | ICD-10-CM | POA: Diagnosis not present

## 2016-11-15 DIAGNOSIS — R0689 Other abnormalities of breathing: Principal | ICD-10-CM

## 2016-11-15 NOTE — Progress Notes (Signed)
Daily Session Note  Patient Details  Name: Caleb Butler MRN: [id number] Date of Birth: 1945-11-08 Referring Provider:     PULMONARY REHAB OTHER RESP ORIENTATION from 09/06/2016 in [place]  Referring Provider  [name]      Encounter Date: 11/15/2016  Check In:     Session Check In - 11/15/16 1057      Check-In   Location [facility]   Staff Present Diane Angelina Pih, MS, EP, [facility], Exercise Physiologist;Davinder Haff Luther Parody, BS, EP, Exercise Physiologist   Supervising physician immediately available to respond to emergencies See telemetry face sheet for immediately available MD   Medication changes reported     No   Fall or balance concerns reported    No   Warm-up and Cool-down Performed as group-led instruction   Resistance Training Performed Yes   VAD Patient? No     Pain Assessment   Currently in Pain? No/denies   Pain Score 0-No pain   Multiple Pain Sites No      Capillary Blood Glucose: No results found for this or any previous visit (from the past 24 hour(s)).    History  Smoking Status  . Never Smoker  Smokeless Tobacco  . Never Used    Goals Met:  Independence with exercise equipment Improved SOB with ADL's Using PLB without cueing & demonstrates good technique Exercise tolerated well No report of cardiac concerns or symptoms Strength training completed today  Goals Unmet:  Not Applicable  Comments: Check out 1145   Dr. Sinda Du is Medical Director for [facility] Pulmonary Re[facility].

## 2016-11-17 ENCOUNTER — Encounter (HOSPITAL_COMMUNITY): Payer: Medicare Other

## 2016-11-22 ENCOUNTER — Encounter (HOSPITAL_COMMUNITY)
Admission: RE | Admit: 2016-11-22 | Discharge: 2016-11-22 | Disposition: A | Discharge status: Home / Self Care | Payer: Medicare Other | Source: Ambulatory Visit | Attending: Internal Medicine | Admitting: Internal Medicine

## 2016-11-22 DIAGNOSIS — R0600 Dyspnea, unspecified: Secondary | ICD-10-CM

## 2016-11-22 DIAGNOSIS — R0689 Other abnormalities of breathing: Principal | ICD-10-CM

## 2016-11-22 NOTE — Progress Notes (Signed)
Daily Session Note  Patient Details  Name: Caleb Butler MRN: [id number] Date of Birth: 1945-12-04 Referring Provider:     PULMONARY REHAB OTHER RESP ORIENTATION from 09/06/2016 in [place]  Referring Provider  [name]      Encounter Date: 11/22/2016  Check In:     Session Check In - 11/22/16 1356      Check-In   Location [facility]   Staff Present Diane Angelina Pih, MS, EP, [facility], Exercise Physiologist;[employer] Luther Parody, BS, EP, Exercise Physiologist   Supervising physician immediately available to respond to emergencies See telemetry face sheet for immediately available MD   Medication changes reported     No   Fall or balance concerns reported    No   Warm-up and Cool-down Performed as group-led instruction   Resistance Training Performed Yes   VAD Patient? No     Pain Assessment   Currently in Pain? No/denies   Pain Score 0-No pain   Multiple Pain Sites No      Capillary Blood Glucose: No results found for this or any previous visit (from the past 24 hour(s)).    History  Smoking Status  . Never Smoker  Smokeless Tobacco  . Never Used    Goals Met:  Independence with exercise equipment Improved SOB with ADL's Using PLB without cueing & demonstrates good technique Exercise tolerated well No report of cardiac concerns or symptoms Strength training completed today  Goals Unmet:  Not Applicable  Comments: Check out 230   Dr. Sinda Du is Medical Director for [facility] Pulmonary Rehab.

## 2016-11-24 ENCOUNTER — Encounter (HOSPITAL_COMMUNITY)
Admission: RE | Admit: 2016-11-24 | Discharge: 2016-11-24 | Disposition: A | Discharge status: Home / Self Care | Payer: Medicare Other | Source: Ambulatory Visit | Attending: Internal Medicine | Admitting: Internal Medicine

## 2016-11-24 DIAGNOSIS — R0689 Other abnormalities of breathing: Principal | ICD-10-CM

## 2016-11-24 DIAGNOSIS — R0600 Dyspnea, unspecified: Secondary | ICD-10-CM | POA: Diagnosis not present

## 2016-11-24 NOTE — Progress Notes (Signed)
Daily Session Note  Patient Details  Name: KAZUKI INGLE MRN: [id number] Date of Birth: [birth year] Referring Provider:     PULMONARY REHAB OTHER RESP ORIENTATION from 09/06/2016 in [place]  Referring Provider  [name]      Encounter Date: 11/24/2016  Check In:     Session Check In - 11/24/16 1051      Check-In   Location [facility]   Staff Present Aundra Dubin, RN, BSN;Calden Dorsey Luther Parody, BS, EP, Exercise Physiologist   Supervising physician immediately available to respond to emergencies See telemetry face sheet for immediately available MD   Medication changes reported     No   Fall or balance concerns reported    No   Warm-up and Cool-down Performed as group-led instruction   Resistance Training Performed Yes   VAD Patient? No     Pain Assessment   Currently in Pain? No/denies   Pain Score 0-No pain   Multiple Pain Sites No      Capillary Blood Glucose: No results found for this or any previous visit (from the past 24 hour(s)).    History  Smoking Status  . Never Smoker  Smokeless Tobacco  . Never Used    Goals Met:  Independence with exercise equipment Improved SOB with ADL's Using PLB without cueing & demonstrates good technique Exercise tolerated well No report of cardiac concerns or symptoms Strength training completed today  Goals Unmet:  Not Applicable  Comments: Check out 1145   Dr. Sinda Du is Medical Director for [facility] Pulmonary Rehab.

## 2016-11-29 ENCOUNTER — Encounter (HOSPITAL_COMMUNITY)
Admission: RE | Admit: 2016-11-29 | Discharge: 2016-11-29 | Disposition: A | Discharge status: Home / Self Care | Payer: Medicare Other | Source: Ambulatory Visit | Attending: Internal Medicine | Admitting: Internal Medicine

## 2016-11-29 DIAGNOSIS — E119 Type 2 diabetes mellitus without complications: Secondary | ICD-10-CM | POA: Insufficient documentation

## 2016-11-29 DIAGNOSIS — R0689 Other abnormalities of breathing: Secondary | ICD-10-CM

## 2016-11-29 DIAGNOSIS — R0600 Dyspnea, unspecified: Secondary | ICD-10-CM | POA: Insufficient documentation

## 2016-11-29 DIAGNOSIS — E039 Hypothyroidism, unspecified: Secondary | ICD-10-CM | POA: Insufficient documentation

## 2016-11-29 NOTE — Progress Notes (Signed)
Daily Session Note  Patient Details  Name: Caleb Butler MRN: [id number] Date of Birth: 1946-02-18 Referring Provider:     PULMONARY REHAB OTHER RESP ORIENTATION from 09/06/2016 in [place]  Referring Provider  [name]      Encounter Date: 11/29/2016  Check In:     Session Check In - 11/29/16 1053      Check-In   Location [facility]   Staff Present Russella Dar, MS, EP, [facility], Exercise Physiologist;Shalona Harbour Luther Parody, BS, EP, Exercise Physiologist   Supervising physician immediately available to respond to emergencies See telemetry face sheet for immediately available MD   Medication changes reported     No   Fall or balance concerns reported    No   Warm-up and Cool-down Performed as group-led instruction   Resistance Training Performed Yes   VAD Patient? No     Pain Assessment   Currently in Pain? No/denies   Pain Score 0-No pain   Multiple Pain Sites No      Capillary Blood Glucose: No results found for this or any previous visit (from the past 24 hour(s)).      Exercise Prescription Changes - 11/28/16 1400      Response to Exercise   Blood Pressure (Admit) 96/56   Blood Pressure (Exercise) 110/62   Blood Pressure (Exit) 98/70   Heart Rate (Admit) 87 bpm   Heart Rate (Exercise) 91 bpm   Heart Rate (Exit) 81 bpm   Oxygen Saturation (Admit) 97 %   Oxygen Saturation (Exercise) 96 %   Oxygen Saturation (Exit) 97 %   Rating of Perceived Exertion (Exercise) 10   Perceived Dyspnea (Exercise) 10   Duration Progress to 30 minutes of  aerobic without signs/symptoms of physical distress   Intensity THRR unchanged     Progression   Progression Continue to progress workloads to maintain intensity without signs/symptoms of physical distress.     Resistance Training   Training Prescription Yes   Weight 5   Reps 10-15     Treadmill   MPH 2.7   Grade 0.5   Minutes 20   METs 3.2     Recumbant Elliptical   Level 3   RPM  55   Watts 81   Minutes 20   METs 4.5     Home Exercise Plan   Plans to continue exercise at Home (comment)   Frequency Add 2 additional days to program exercise sessions.      History  Smoking Status  . Never Smoker  Smokeless Tobacco  . Never Used    Goals Met:  Independence with exercise equipment Improved SOB with ADL's Using PLB without cueing & demonstrates good technique Exercise tolerated well No report of cardiac concerns or symptoms Strength training completed today  Goals Unmet:  Not Applicable  Comments: Check out 1145   Dr. Sinda Du is Medical Director for [facility] Pulmonary Rehab.

## 2016-12-01 ENCOUNTER — Encounter (HOSPITAL_COMMUNITY)
Admission: RE | Admit: 2016-12-01 | Discharge: 2016-12-01 | Disposition: A | Discharge status: Home / Self Care | Payer: Medicare Other | Source: Ambulatory Visit | Attending: Internal Medicine | Admitting: Internal Medicine

## 2016-12-01 DIAGNOSIS — R0600 Dyspnea, unspecified: Secondary | ICD-10-CM | POA: Diagnosis not present

## 2016-12-06 ENCOUNTER — Encounter (HOSPITAL_COMMUNITY)
Admission: RE | Admit: 2016-12-06 | Discharge: 2016-12-06 | Disposition: A | Discharge status: Home / Self Care | Payer: Medicare Other | Source: Ambulatory Visit | Attending: Internal Medicine | Admitting: Internal Medicine

## 2016-12-06 DIAGNOSIS — R0689 Other abnormalities of breathing: Principal | ICD-10-CM

## 2016-12-06 DIAGNOSIS — R0600 Dyspnea, unspecified: Secondary | ICD-10-CM

## 2016-12-06 NOTE — Progress Notes (Signed)
Daily Session Note  Patient Details  Name: Caleb Butler MRN: [id number] Date of Birth: 05-Nov-1945 Referring Provider:     PULMONARY REHAB OTHER RESP ORIENTATION from 09/06/2016 in [place]  Referring Provider  [name]      Encounter Date: 12/06/2016  Check In:     Session Check In - 12/06/16 1101      Check-In   Location [facility]   Staff Present Diane Angelina Pih, MS, EP, [facility], Exercise Physiologist;Thaddeus Evitts Luther Parody, BS, EP, Exercise Physiologist   Supervising physician immediately available to respond to emergencies See telemetry face sheet for immediately available MD   Medication changes reported     No   Warm-up and Cool-down Performed as group-led instruction   Resistance Training Performed Yes   VAD Patient? No     Pain Assessment   Currently in Pain? No/denies   Pain Score 0-No pain   Multiple Pain Sites No      Capillary Blood Glucose: No results found for this or any previous visit (from the past 24 hour(s)).    History  Smoking Status  . Never Smoker  Smokeless Tobacco  . Never Used    Goals Met:  Independence with exercise equipment Improved SOB with ADL's Using PLB without cueing & demonstrates good technique Exercise tolerated well No report of cardiac concerns or symptoms Strength training completed today  Goals Unmet:  Not Applicable  Comments: Check out 1145   Dr. Sinda Du is Medical Director for [facility] Pulmonary Rehab.

## 2016-12-07 NOTE — Progress Notes (Signed)
Pulmonary Individual Treatment Plan  Patient Details  Name: Caleb Butler MRN: [id number] Date of Birth: 10/30/1945 Referring Provider:     PULMONARY REHAB OTHER RESP ORIENTATION from 09/06/2016 in [place]  Referring Provider  [name]      Initial Encounter Date:    [place] from 09/06/2016 in [place]  Date  09/06/16  Referring Provider  [name]      Visit Diagnosis: Dyspnea and respiratory abnormalities  Patient's Home Medications on Admission:   Current Outpatient Prescriptions:  .  acetaminophen (TYLENOL) 325 MG tablet, Take 650 mg by mouth every 4 (four) hours as needed., Disp: , Rfl:  .  aspirin 81 MG EC tablet, Take 81 mg by mouth at bedtime. , Disp: , Rfl:  .  empagliflozin (JARDIANCE) 25 MG TABS tablet, Take 25 mg by mouth daily. (Patient not taking: Reported on 10/10/2016), Disp: 30 tablet, Rfl: 5 .  Glucose Blood (BLOOD GLUCOSE TEST STRIPS) STRP, , Disp: , Rfl:  .  glucose blood test strip, Check BS BID Dx: E11.9, Disp: 100 each, Rfl: 5 .  isosorbide mononitrate (IMDUR) 30 MG 24 hr tablet, Take 1 tablet (30 mg total) by mouth daily., Disp: 90 tablet, Rfl: 3 .  levothyroxine (SYNTHROID, LEVOTHROID) 100 MCG tablet, Take 1 tablet (100 mcg total) by mouth daily before breakfast., Disp: 90 tablet, Rfl: 3 .  metFORMIN (GLUCOPHAGE) 1000 MG tablet, Take 1 tablet (1,000 mg total) by mouth 2 (two) times daily with a meal., Disp: 180 tablet, Rfl: 3 .  Multiple Vitamin (MULTIVITAMIN WITH MINERALS) TABS tablet, Take 1 tablet by mouth daily., Disp: , Rfl:  .  predniSONE (DELTASONE) 10 MG tablet, Take 1.5 tablets (15 mg total) by mouth daily with breakfast. X 2 weeks, Disp: 21 tablet, Rfl: 0 .  rosuvastatin (CRESTOR) 20 MG tablet, Take 1 tablet (20 mg total) by mouth daily. (Patient not taking: Reported on 10/14/2016), Disp: 30 tablet, Rfl: 6 .  vitamin B-12 (CYANOCOBALAMIN) 1000 MCG tablet, Take  1,000 mcg by mouth daily., Disp: , Rfl:   Past Medical History: Past Medical History:  Diagnosis Date  . CAD (coronary artery disease)    80% ostial lesion in L circumflex (2017)  . Hiatal hernia   . Hypothyroidism   . PMR (polymyalgia rheumatica) (HCC)   . Type 2 diabetes mellitus (HCC)     Tobacco Use: History  Smoking Status  . Never Smoker  Smokeless Tobacco  . Never Used    Labs: Recent Review Flowsheet Data    Labs for ITP Cardiac and Pulmonary Rehab Latest Ref Rng & Units 11/30/2015 07/07/2016 07/07/2016 07/07/2016 08/18/2016   Cholestrol 125 - 200 mg/dL 140 - - - -   LDLCALC <130 mg/dL 57 - - - -   HDL >=40 mg/dL 41 - - - -   Trlycerides <150 mg/dL 210(H) - - - -   Hemoglobin A1c <5.7 % 7.3(H) - - - 6.4(H)   PHART 7.350 - 7.450 - 7.382 - - -   PCO2ART 32.0 - 48.0 mmHg - 40.7 - - -   HCO3 20.0 - 28.0 mmol/L - 24.2 21.2 24.1 -   TCO2 0 - 100 mmol/L - '25 22 25 '$ -   ACIDBASEDEF 0.0 - 2.0 mmol/L - 1.0 5.0(H) 2.0 -   O2SAT % - 95.0 68.0 69.0 -      Capillary Blood Glucose: Lab Results  Component Value Date   GLUCAP 205 (H) 09/08/2016  GLUCAP 97 07/07/2016   GLUCAP 148 (H) 07/07/2016     ADL UCSD:     Pulmonary Assessment Scores    Row Name 09/06/16 1336         ADL UCSD   ADL Phase Entry     SOB Score total 56     Rest 1     Walk 8     Stairs 4     Bath 1     Dress 2     Shop 2       CAT Score   CAT Score 22       mMRC Score   mMRC Score 3        Pulmonary Function Assessment:     Pulmonary Function Assessment - 09/06/16 1332      Pulmonary Function Tests   FVC% 63 %   FEV1% 69 %   FEV1/FVC Ratio 108   DLCO% 66 %     Initial Spirometry Results   FVC% 64 %   FEV1% 65 %   FEV1/FVC Ratio 75     Post Bronchodilator Spirometry Results   FVC% 63 %   FEV1% 69 %   FEV1/FVC Ratio 81     Breath   Bilateral Breath Sounds Clear   Shortness of Breath Yes  SOB with exertion      Exercise Target Goals:    Exercise Program  Goal: Individual exercise prescription set with THRR, safety & activity barriers. Participant demonstrates ability to understand and report RPE using BORG scale, to self-measure pulse accurately, and to acknowledge the importance of the exercise prescription.  Exercise Prescription Goal: Starting with aerobic activity 30 plus minutes a day, 3 days per week for initial exercise prescription. Provide home exercise prescription and guidelines that participant acknowledges understanding prior to discharge.  Activity Barriers & Risk Stratification:   6 Minute Walk:     6 Minute Walk    Row Name 09/06/16 1152         6 Minute Walk   Phase Initial     Distance 1400 feet     Distance % Change 0 %     Walk Time 6 minutes     # of Rest Breaks 0     MPH 2.65     METS 3.03     RPE 9     Perceived Dyspnea  13     VO2 Peak 10.01     Symptoms No     Resting HR 83 bpm     Resting BP 100/74     Max Ex. HR 89 bpm     Max Ex. BP 114/80     2 Minute Post BP 102/76        Oxygen Initial Assessment:     Oxygen Initial Assessment - 09/06/16 1345      Home Oxygen   Home Oxygen Device None   Sleep Oxygen Prescription None   Home Exercise Oxygen Prescription None   Home at Rest Exercise Oxygen Prescription None     Initial 6 min Walk   Oxygen Used None     Program Oxygen Prescription   Program Oxygen Prescription None      Oxygen Re-Evaluation:   Oxygen Discharge (Final Oxygen Re-Evaluation):   Initial Exercise Prescription:     Initial Exercise Prescription - 09/06/16 1100      Date of Initial Exercise RX and Referring Provider   Date 09/06/16   Referring Provider [name]  Treadmill   MPH 2   Grade 0   Minutes 15   METs 2.5     Recumbant Elliptical   Level 1   RPM 37   Watts 37   Minutes 20   METs 2.2     Prescription Details   Frequency (times per week) 2   Duration Progress to 30 minutes of continuous aerobic without signs/symptoms of physical  distress     Intensity   THRR 40-80% of Max Heartrate [phone]   Ratings of Perceived Exertion 11-13   Perceived Dyspnea 0-4     Progression   Progression Continue progressive overload as per policy without signs/symptoms or physical distress.     Resistance Training   Training Prescription Yes   Weight 1   Reps 10-15      Perform Capillary Blood Glucose checks as needed.  Exercise Prescription Changes:      Exercise Prescription Changes    Row Name 09/19/16 1400 10/14/16 1400 10/18/16 1200 11/01/16 1100 11/10/16 0800     Response to Exercise   Blood Pressure (Admit) 110/60 98/60 102/54 100/52 102/66   Blood Pressure (Exercise) 134/72 118/66 108/60 116/64 120/58   Blood Pressure (Exit) 100/64 92/56 104/62 106/60 98/60   Heart Rate (Admit) 86 bpm 81 bpm 74 bpm 80 bpm 86 bpm   Heart Rate (Exercise) 98 bpm 89 bpm 81 bpm 87 bpm 86 bpm   Heart Rate (Exit) 79 bpm 85 bpm 73 bpm 87 bpm 96 bpm   Oxygen Saturation (Admit) 94 % 95 % 94 % 97 % 96 %   Oxygen Saturation (Exercise) 96 % 96 % 97 % 96 % 95 %   Oxygen Saturation (Exit) 96 % 96 % 96 % 95 % 96 %   Rating of Perceived Exertion (Exercise) '11 10 11 11 12   '$ Perceived Dyspnea (Exercise) '11 10 11 11 12   '$ Duration Progress to 30 minutes of  aerobic without signs/symptoms of physical distress Progress to 30 minutes of  aerobic without signs/symptoms of physical distress Progress to 30 minutes of  aerobic without signs/symptoms of physical distress Progress to 30 minutes of  aerobic without signs/symptoms of physical distress Progress to 30 minutes of  aerobic without signs/symptoms of physical distress   Intensity THRR unchanged THRR unchanged THRR unchanged THRR unchanged THRR unchanged     Progression   Progression Continue to progress workloads to maintain intensity without signs/symptoms of physical distress. Continue to progress workloads to maintain intensity without signs/symptoms of physical distress. Continue to progress  workloads to maintain intensity without signs/symptoms of physical distress. Continue to progress workloads to maintain intensity without signs/symptoms of physical distress. Continue to progress workloads to maintain intensity without signs/symptoms of physical distress.     Resistance Training   Training Prescription Yes Yes Yes Yes Yes   Weight '4 4 4 4 4   '$ Reps 10-15 10-15 10-15 10-15 10-15     Treadmill   MPH 2.5 2.5 2.5 2.7 2.6   Grade 0 0 0 0 0.5   Minutes '15 15 15 15 20   '$ METs 4.06 4.06 4.06 4.2 3.1     Recumbant Elliptical   Level '2 2 3 3 3   '$ RPM 49 49 61 58 54   Watts 69 69 80 73 68   Minutes '20 20 20 20 20   '$ METs 3.5 3.5 4.6 3 3.6     Home Exercise Plan   Plans to continue exercise at Home (comment) Home (comment)  Home (comment) Home (comment) Home (comment)   Frequency Add 2 additional days to program exercise sessions. Add 2 additional days to program exercise sessions. Add 2 additional days to program exercise sessions. Add 2 additional days to program exercise sessions. Add 2 additional days to program exercise sessions.   [place] Name 11/28/16 1400             Response to Exercise   Blood Pressure (Admit) 96/56       Blood Pressure (Exercise) 110/62       Blood Pressure (Exit) 98/70       Heart Rate (Admit) 87 bpm       Heart Rate (Exercise) 91 bpm       Heart Rate (Exit) 81 bpm       Oxygen Saturation (Admit) 97 %       Oxygen Saturation (Exercise) 96 %       Oxygen Saturation (Exit) 97 %       Rating of Perceived Exertion (Exercise) 10       Perceived Dyspnea (Exercise) 10       Duration Progress to 30 minutes of  aerobic without signs/symptoms of physical distress       Intensity THRR unchanged         Progression   Progression Continue to progress workloads to maintain intensity without signs/symptoms of physical distress.         Resistance Training   Training Prescription Yes       Weight 5       Reps 10-15         Treadmill   MPH 2.7       Grade  0.5       Minutes 20       METs 3.2         Recumbant Elliptical   Level 3       RPM 55       Watts 81       Minutes 20       METs 4.5         Home Exercise Plan   Plans to continue exercise at Home (comment)       Frequency Add 2 additional days to program exercise sessions.          Exercise Comments:      Exercise Comments    Row Name 09/19/16 1427 10/18/16 1249 11/01/16 1141 11/10/16 0823 11/28/16 1438   Exercise Comments Patient is very determined and is progressing well in PR.  Patient is progressing well in PR.  Patient is doing well in PR.  Patient is doing well in PR.  Patient is doing very well in PR.       Exercise Goals and Review:      Exercise Goals    Row Name 09/06/16 1345             Exercise Goals   Increase Physical Activity Yes       Intervention Provide advice, education, support and counseling about physical activity/exercise needs.;Develop an individualized exercise prescription for aerobic and resistive training based on initial evaluation findings, risk stratification, comorbidities and participant's personal goals.       Expected Outcomes Achievement of increased cardiorespiratory fitness and enhanced flexibility, muscular endurance and strength shown through measurements of functional capacity and personal statement of participant.       Increase Strength and Stamina Yes       Intervention Provide advice, education, support and counseling about  physical activity/exercise needs.;Develop an individualized exercise prescription for aerobic and resistive training based on initial evaluation findings, risk stratification, comorbidities and participant's personal goals.       Expected Outcomes Achievement of increased cardiorespiratory fitness and enhanced flexibility, muscular endurance and strength shown through measurements of functional capacity and personal statement of participant.          Exercise Goals Re-Evaluation :     Exercise Goals  Re-Evaluation    Row Name 09/22/16 6599 10/20/16 1507 11/14/16 1351 12/07/16 1417       Exercise Goal Re-Evaluation   Exercise Goals Review Increase Physical Activity;Increase Strenth and Stamina  Breathe better; get in shape.  Increase Physical Activity;Increase Strenth and Stamina Increase Physical Activity;Increase Strenth and Stamina Increase Physical Activity;Increase Strenth and Stamina    Comments After completing 5 sessions, patient has had some progression. Will continue to monitor.  Patient has completed 13 sessions. Eventhough the he has had some progression, he does not feel like his strength and stamina have increased. He hopes this will change after completing the program. 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  After completing 19 sessions, patient is progressing. He says he can not tell a difference in his strength yet but hopes it will improve as he continues the program.  Patient has completed 23 sessions with reported increased strenght and increased activity. He is progressing in weights, treadmill speed and eliptical level. Will continue to monitor.     Expected Outcomes Patient will continue to work toward his goal to beathe better and get in shape.  Patient will complete the program with increased strenght, stamina, and activity.  Patient will complete the program with increased strength, stamina, and activity.  Patient will complete the program with continued increased strength, stamina, and activity.       Discharge Exercise Prescription (Final Exercise Prescription Changes):     Exercise Prescription Changes - 11/28/16 1400      Response to Exercise   Blood Pressure (Admit) 96/56   Blood Pressure (Exercise) 110/62   Blood Pressure (Exit) 98/70   Heart Rate (Admit) 87 bpm   Heart Rate (Exercise) 91 bpm   Heart Rate (Exit) 81 bpm   Oxygen Saturation (Admit) 97 %   Oxygen Saturation (Exercise) 96 %   Oxygen Saturation  (Exit) 97 %   Rating of Perceived Exertion (Exercise) 10   Perceived Dyspnea (Exercise) 10   Duration Progress to 30 minutes of  aerobic without signs/symptoms of physical distress   Intensity THRR unchanged     Progression   Progression Continue to progress workloads to maintain intensity without signs/symptoms of physical distress.     Resistance Training   Training Prescription Yes   Weight 5   Reps 10-15     Treadmill   MPH 2.7   Grade 0.5   Minutes 20   METs 3.2     Recumbant Elliptical   Level 3   RPM 55   Watts 81   Minutes 20   METs 4.5     Home Exercise Plan   Plans to continue exercise at Home (comment)   Frequency Add 2 additional days to program exercise sessions.      Nutrition:  Target Goals: Understanding of nutrition guidelines, daily intake of sodium '1500mg'$ , cholesterol '200mg'$ , calories 30% from fat and 7% or less from saturated fats, daily to have 5 or more servings of fruits and vegetables.  Biometrics:  Pre Biometrics - 09/06/16 1153      Pre Biometrics   Height '5\' 9"'$  (1.753 m)   Weight 189 lb 6 oz (85.9 kg)   Waist Circumference 38 inches   Hip Circumference 37.5 inches   Waist to Hip Ratio 1.01 %   BMI (Calculated) 28   Triceps Skinfold 9 mm   % Body Fat 24.6 %   Grip Strength 76.67 kg   Flexibility 11 in   Single Leg Stand 6 seconds       Nutrition Therapy Plan and Nutrition Goals:   Nutrition Discharge: Rate Your Plate Scores:   Nutrition Goals Re-Evaluation:   Nutrition Goals Discharge (Final Nutrition Goals Re-Evaluation):   Psychosocial: Target Goals: Acknowledge presence or absence of significant depression and/or stress, maximize coping skills, provide positive support system. Participant is able to verbalize types and ability to use techniques and skills needed for reducing stress and depression.  Initial Review & Psychosocial Screening:     Initial Psych Review & Screening - 09/06/16 1351      Initial  Review   Current issues with Current Depression  Patient feels his is depressed because he can not do any of his ADL without SOB.      Family Dynamics   Good Support System? Yes     Barriers   Psychosocial barriers to participate in program There are no identifiable barriers or psychosocial needs.     Screening Interventions   Interventions Encouraged to exercise      Quality of Life Scores:     Quality of Life - 09/06/16 1154      Quality of Life Scores   Health/Function Pre 18.83 %   Socioeconomic Pre 27.3 %   Psych/Spiritual Pre 23 %   Family Pre 21 %   GLOBAL Pre 21.05 %      PHQ-9: Recent Review Flowsheet Data    Depression screen [facility] 2/9 09/06/2016 08/18/2016 01/18/2016   Decreased Interest 1 0 0   Down, Depressed, Hopeless 1 0 0   PHQ - 2 Score 2 0 0   Altered sleeping 3 0 -   Tired, decreased energy 3 0 -   Change in appetite 2 0 -   Feeling bad or failure about yourself  0 0 -   Trouble concentrating 1 0 -   Moving slowly or fidgety/restless 1 0 -   Suicidal thoughts 0 0 -   PHQ-9 Score 12 0 -   Difficult doing work/chores Somewhat difficult Not difficult at all -     Interpretation of Total Score  Total Score Depression Severity:  1-4 = Minimal depression, 5-9 = Mild depression, 10-14 = Moderate depression, 15-19 = Moderately severe depression, 20-27 = Severe depression   Psychosocial Evaluation and Intervention:     Psychosocial Evaluation - 09/06/16 1352      Psychosocial Evaluation & Interventions   Interventions Stress management education;Relaxation education;Encouraged to exercise with the program and follow exercise prescription   Continue Psychosocial Services  Follow up required by staff      Psychosocial Re-Evaluation:     Psychosocial Re-Evaluation    Row Name 09/22/16 4268 10/20/16 1510 11/14/16 1407 12/07/16 1419       Psychosocial Re-Evaluation   Current issues with Current Depression Current Depression Current Depression  Current Depression    Comments Patient is still considering counseling.  Patient is still considering seeing a couselor for his depression.  Patient continues to have depression but is not interested in treatment at  this time.  Patient is not interested in treatment.     Expected Outcomes Patient's PHQ-9 and QOL scores will improve at discharge.  Patient will have improved PHQ-9 and QOL scores at discharge and receiving treatment for his depression.  Patient will have improved QOL and PHQ-9 scores or no change at discharge.  Patient will have improved QOL and PHQ-9 scores at discharge.     Interventions Encouraged to attend Pulmonary Rehabilitation for the exercise Relaxation education;Stress management education;Encouraged to attend Pulmonary Rehabilitation for the exercise Relaxation education;Encouraged to attend Pulmonary Rehabilitation for the exercise;Stress management education Stress management education;Encouraged to attend Pulmonary Rehabilitation for the exercise;Relaxation education    Continue Psychosocial Services  Follow up required by staff Follow up required by staff Follow up required by staff No Follow up required       Psychosocial Discharge (Final Psychosocial Re-Evaluation):     Psychosocial Re-Evaluation - 12/07/16 1419      Psychosocial Re-Evaluation   Current issues with Current Depression   Comments Patient is not interested in treatment.    Expected Outcomes Patient will have improved QOL and PHQ-9 scores at discharge.    Interventions Stress management education;Encouraged to attend Pulmonary Rehabilitation for the exercise;Relaxation education   Continue Psychosocial Services  No Follow up required       Education: Education Goals: Education classes will be provided on a weekly basis, covering required topics. Participant will state understanding/return demonstration of topics presented.  Learning Barriers/Preferences:     Learning Barriers/Preferences -  09/06/16 1208      Learning Barriers/Preferences   Learning Barriers None   Learning Preferences Skilled Demonstration;Verbal Instruction;Individual Instruction;Group Instruction      Education Topics: How Lungs Work and Diseases: - Discuss the anatomy of the lungs and diseases that can affect the lungs, such as COPD.   Exercise: -Discuss the importance of exercise, FITT principles of exercise, normal and abnormal responses to exercise, and how to exercise safely.   PULMONARY REHAB OTHER RESPIRATORY from 10/20/2016 in [place]  Date  10/06/16  Educator  Nils Flack  Instruction Review Code  2- meets goals/outcomes      Environmental Irritants: -Discuss types of environmental irritants and how to limit exposure to environmental irritants.   PULMONARY REHAB OTHER RESPIRATORY from 10/20/2016 in [place]  Date  10/13/16  Educator  [place]  Instruction Review Code  2- meets goals/outcomes      Meds/Inhalers and oxygen: - Discuss respiratory medications, definition of an inhaler and oxygen, and the proper way to use an inhaler and oxygen.   PULMONARY REHAB OTHER RESPIRATORY from 10/20/2016 in [place]  Date  10/20/16  Educator  [place]  Instruction Review Code  2- meets goals/outcomes      Energy Saving Techniques: - Discuss methods to conserve energy and decrease shortness of breath when performing activities of daily living.    PULMONARY REHAB OTHER RESPIRATORY from 12/01/2016 in [place]  Date  10/27/16  Educator  Nils Flack  Instruction Review Code  2- meets goals/outcomes      Bronchial Hygiene / Breathing Techniques: - Discuss breathing mechanics, pursed-lip breathing technique,  proper posture, effective ways to clear airways, and other functional breathing techniques   Cleaning Equipment: - Provides group verbal and written instruction about the health risks of elevated stress,  cause of high stress, and healthy ways to reduce stress.   Nutrition I: Fats: - Discuss the types of cholesterol, what cholesterol  does to the body, and how cholesterol levels can be controlled.   Nutrition II: Labels: -Discuss the different components of food labels and how to read food labels.   PULMONARY REHAB OTHER RESPIRATORY from 12/01/2016 in [place]  Date  11/24/16  Educator  [place]  Instruction Review Code  2- meets goals/outcomes      Respiratory Infections: - Discuss the signs and symptoms of respiratory infections, ways to prevent respiratory infections, and the importance of seeking medical treatment when having a respiratory infection.   PULMONARY REHAB OTHER RESPIRATORY from 12/01/2016 in [place]  Date  12/01/16  Educator  [place]  Instruction Review Code  2- meets goals/outcomes      Stress I: Signs and Symptoms: - Discuss the causes of stress, how stress may lead to anxiety and depression, and ways to limit stress.   PULMONARY REHAB OTHER RESPIRATORY from 10/20/2016 in [place]  Date  09/08/16  Educator  [place]  Instruction Review Code  2- meets goals/outcomes      Stress II: Relaxation: -Discuss relaxation techniques to limit stress.   PULMONARY REHAB OTHER RESPIRATORY from 10/20/2016 in [place]  Date  09/15/16  Educator  [place]  Instruction Review Code  2- meets goals/outcomes      Oxygen for Home/Travel: - Discuss how to prepare for travel when on oxygen and proper ways to transport and store oxygen to ensure safety.   PULMONARY REHAB OTHER RESPIRATORY from 10/20/2016 in [place]  Date  09/22/16  Educator  [place]  Instruction Review Code  2- meets goals/outcomes      Knowledge Questionnaire Score:     Knowledge Questionnaire Score - 09/06/16 1330      Knowledge Questionnaire Score   Pre Score 9/14      Core Components/Risk  Factors/Patient Goals at Admission:     Personal Goals and Risk Factors at Admission - 09/06/16 1346      Core Components/Risk Factors/Patient Goals on Admission    Weight Management Yes   Intervention Weight Management/Obesity: Establish reasonable short term and long term weight goals.   Admit Weight 189 lb (85.7 kg)   Goal Weight: Short Term 184 lb (83.5 kg)   Goal Weight: Long Term 179 lb (81.2 kg)   Expected Outcomes Short Term: Continue to assess and modify interventions until short term weight is achieved;Long Term: Adherence to nutrition and physical activity/exercise program aimed toward attainment of established weight goal   Personal Goal Other Yes   Personal Goal Breath better during activities   Intervention PR 2 x week and supplement exercise at home 3 x week. He plans to [facility] silver sneakers at [facility] to further his exercise.    Expected Outcomes Achieve personal goals.       Core Components/Risk Factors/Patient Goals Review:      Goals and Risk Factor Review    Row Name 09/22/16 0936 10/20/16 1505 11/14/16 1348 12/07/16 1415       Core Components/Risk Factors/Patient Goals Review   Personal Goals Review Weight Management/Obesity Weight Management/Obesity;Improve shortness of breath with ADL's Weight Management/Obesity;Improve shortness of breath with ADL's  Breathe better; get in shape.  Improve shortness of breath with ADL's;Weight Management/Obesity  Breathe better; get in shape.     Review Patient has completed 5 sessions maintaining his weight. Will continue to monitor for progress.  Patient has completed 13 sessions gaining 4.7 lbs. He says his SOB has not improved. He hopes  when he completes the program, he will see a difference in his SOB.  Patient has compeleted 17 sessions losing 1 lb. He is progressing in the program. He says he can not tell if he is improving or not. 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 Will continue to monitor for progress.   Patient has completed 23 sessions losing 4.5 lbs. He is doing well in the program. He says he continues to be SOB but feels like he is getting in shape and believes the program is benefiting him. Will continue to monitor.     Expected Outcomes Patient will continue to work toward his weight loss goal. Patient will complete the program and begin to work toward his weight loss goal with improved SOB.  Patient will complete the program and work toward meeting his personal goals.  Patient will complete the program meeting his personal goals.        Core Components/Risk Factors/Patient Goals at Discharge (Final Review):      Goals and Risk Factor Review - 12/07/16 1415      Core Components/Risk Factors/Patient Goals Review   Personal Goals Review Improve shortness of breath with ADL's;Weight Management/Obesity  Breathe better; get in shape.    Review Patient has completed 23 sessions losing 4.5 lbs. He is doing well in the program. He says he continues to be SOB but feels like he is getting in shape and believes the program is benefiting him. Will continue to monitor.    Expected Outcomes Patient will complete the program meeting his personal goals.       ITP Comments:     ITP Comments    Row Name 12/01/16 1551           ITP Comments Patient met with Registered Dietitian to discuss nutrition topics including: Heart healthty eating, heart healthy cooking and making smart choices when shopping; Portion control; weight management; and hydration.          Comments: ITP 30 Day REVIEW Pt is making expected progress toward pulmonary rehab goals after completing 23 sessions. Recommend continued exercise, life style modification, education, and utilization of breathing techniques to increase stamina and strength and decrease shortness of breath with exertion.

## 2016-12-08 ENCOUNTER — Encounter (HOSPITAL_COMMUNITY)
Admission: RE | Admit: 2016-12-08 | Discharge: 2016-12-08 | Disposition: A | Discharge status: Home / Self Care | Payer: Medicare Other | Source: Ambulatory Visit | Attending: Internal Medicine | Admitting: Internal Medicine

## 2016-12-08 DIAGNOSIS — R0689 Other abnormalities of breathing: Principal | ICD-10-CM

## 2016-12-08 DIAGNOSIS — R0600 Dyspnea, unspecified: Secondary | ICD-10-CM | POA: Diagnosis not present

## 2016-12-08 NOTE — Progress Notes (Signed)
Daily Session Note  Patient Details  Name: MAINOR HELLMANN MRN: [id number] Date of Birth: [birth year] Referring Provider:     PULMONARY REHAB OTHER RESP ORIENTATION from 09/06/2016 in [place]  Referring Provider  [name]      Encounter Date: 12/08/2016  Check In:     Session Check In - 12/08/16 1054      Check-In   Location [facility]   Staff Present Aundra Dubin, RN, BSN;Jeraline Marcinek Luther Parody, BS, EP, Exercise Physiologist   Supervising physician immediately available to respond to emergencies See telemetry face sheet for immediately available MD   Medication changes reported     No   Fall or balance concerns reported    No   Warm-up and Cool-down Performed as group-led instruction   Resistance Training Performed Yes   VAD Patient? No     Pain Assessment   Currently in Pain? No/denies   Pain Score 0-No pain   Multiple Pain Sites No      Capillary Blood Glucose: No results found for this or any previous visit (from the past 24 hour(s)).    History  Smoking Status  . Never Smoker  Smokeless Tobacco  . Never Used    Goals Met:  Independence with exercise equipment Improved SOB with ADL's Using PLB without cueing & demonstrates good technique Exercise tolerated well No report of cardiac concerns or symptoms Strength training completed today  Goals Unmet:  Not Applicable  Comments: Check out 1145   Dr. Sinda Du is Medical Director for [facility].

## 2016-12-13 ENCOUNTER — Encounter (HOSPITAL_COMMUNITY): Payer: Medicare Other

## 2016-12-15 ENCOUNTER — Encounter (HOSPITAL_COMMUNITY)
Admission: RE | Admit: 2016-12-15 | Discharge: 2016-12-15 | Disposition: A | Discharge status: Home / Self Care | Payer: Medicare Other | Source: Ambulatory Visit | Attending: Internal Medicine | Admitting: Internal Medicine

## 2016-12-15 DIAGNOSIS — R0600 Dyspnea, unspecified: Secondary | ICD-10-CM | POA: Diagnosis not present

## 2016-12-16 ENCOUNTER — Ambulatory Visit (HOSPITAL_COMMUNITY)
Admission: RE | Admit: 2016-12-16 | Discharge: 2016-12-16 | Disposition: A | Discharge status: Home / Self Care | Payer: Medicare Other | Source: Ambulatory Visit | Attending: Sports Medicine | Admitting: Sports Medicine

## 2016-12-16 ENCOUNTER — Other Ambulatory Visit (HOSPITAL_COMMUNITY): Payer: Self-pay | Admitting: Sports Medicine

## 2016-12-16 DIAGNOSIS — M5126 Other intervertebral disc displacement, lumbar region: Secondary | ICD-10-CM | POA: Insufficient documentation

## 2016-12-16 DIAGNOSIS — M4807 Spinal stenosis, lumbosacral region: Secondary | ICD-10-CM | POA: Diagnosis not present

## 2016-12-16 DIAGNOSIS — M5441 Lumbago with sciatica, right side: Secondary | ICD-10-CM

## 2016-12-16 DIAGNOSIS — M5136 Other intervertebral disc degeneration, lumbar region: Secondary | ICD-10-CM | POA: Insufficient documentation

## 2016-12-20 ENCOUNTER — Encounter (HOSPITAL_COMMUNITY)
Admission: RE | Admit: 2016-12-20 | Discharge: 2016-12-20 | Disposition: A | Discharge status: Home / Self Care | Payer: Medicare Other | Source: Ambulatory Visit | Attending: Internal Medicine | Admitting: Internal Medicine

## 2016-12-20 DIAGNOSIS — R0600 Dyspnea, unspecified: Secondary | ICD-10-CM | POA: Diagnosis not present

## 2016-12-20 NOTE — Progress Notes (Signed)
Daily Session Note  Patient Details  Name: Caleb Butler MRN: [id number] Date of Birth: 04-28-1946 Referring Provider:     PULMONARY REHAB OTHER RESP ORIENTATION from 09/06/2016 in [place]  Referring Provider  [name]      Encounter Date: 12/20/2016  Check In:     Session Check In - 12/20/16 1045      Check-In   Location [facility]   Staff Present Samara Stankowski Angelina Pih, MS, EP, [facility], Exercise Physiologist;Gregory Luther Parody, BS, EP, Exercise Physiologist   Supervising physician immediately available to respond to emergencies See telemetry face sheet for immediately available MD   Medication changes reported     No   Fall or balance concerns reported    No   Tobacco Cessation No Change   Warm-up and Cool-down Performed as group-led instruction   Resistance Training Performed Yes   VAD Patient? No     Pain Assessment   Currently in Pain? No/denies   Pain Score 0-No pain   Multiple Pain Sites No      Capillary Blood Glucose: No results found for this or any previous visit (from the past 24 hour(s)).      Exercise Prescription Changes - 12/19/16 1500      Response to Exercise   Blood Pressure (Admit) 108/62   Blood Pressure (Exercise) 130/62   Blood Pressure (Exit) 100/50   Heart Rate (Admit) 81 bpm   Heart Rate (Exercise) 90 bpm   Heart Rate (Exit) 77 bpm   Oxygen Saturation (Admit) 95 %   Oxygen Saturation (Exercise) 97 %   Oxygen Saturation (Exit) 95 %   Rating of Perceived Exertion (Exercise) 10   Perceived Dyspnea (Exercise) 10   Duration Progress to 30 minutes of  aerobic without signs/symptoms of physical distress   Intensity THRR unchanged     Progression   Progression Continue to progress workloads to maintain intensity without signs/symptoms of physical distress.     Resistance Training   Training Prescription Yes   Weight 5   Reps 10-15     Recumbant Elliptical   Level 3   RPM 55   Watts 75   Minutes 30   METs 4.3     Home Exercise Plan   Plans to continue exercise at Home (comment)   Frequency Add 2 additional days to program exercise sessions.      History  Smoking Status  . Never Smoker  Smokeless Tobacco  . Never Used    Goals Met:  Proper associated with RPD/PD & O2 Sat Using PLB without cueing & demonstrates good technique Exercise tolerated well No report of cardiac concerns or symptoms Strength training completed today  Goals Unmet:  Not Applicable  Comments: Check out: 11:45   Dr. Sinda Du is Medical Director for [facility] Pulmonary Rehab.

## 2016-12-22 ENCOUNTER — Encounter (HOSPITAL_COMMUNITY): Payer: Medicare Other

## 2016-12-26 ENCOUNTER — Telehealth (HOSPITAL_COMMUNITY): Payer: Self-pay

## 2016-12-26 NOTE — Telephone Encounter (Signed)
Patient's wife called concerned that patient's activity and energy has gradually decreased over past week. 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  States he is more fatigued, SOB, and weak. Reports weight stable at 188 lbs.  No swelling/edema noticed. Will add on to [facility] clinic schedule tomorrow to be seen per wife request.  Renee Pain, RN

## 2016-12-27 ENCOUNTER — Ambulatory Visit (HOSPITAL_COMMUNITY)
Admission: RE | Admit: 2016-12-27 | Discharge: 2016-12-27 | Disposition: A | Discharge status: Home / Self Care | Payer: Medicare Other | Source: Ambulatory Visit | Attending: Cardiology | Admitting: Cardiology

## 2016-12-27 ENCOUNTER — Encounter (HOSPITAL_COMMUNITY): Payer: Medicare Other

## 2016-12-27 VITALS — BP 104/66 | HR 78 | Wt 185.4 lb

## 2016-12-27 DIAGNOSIS — E039 Hypothyroidism, unspecified: Secondary | ICD-10-CM | POA: Diagnosis not present

## 2016-12-27 DIAGNOSIS — I25110 Atherosclerotic heart disease of native coronary artery with unstable angina pectoris: Secondary | ICD-10-CM

## 2016-12-27 DIAGNOSIS — I200 Unstable angina: Secondary | ICD-10-CM | POA: Diagnosis not present

## 2016-12-27 DIAGNOSIS — Z79899 Other long term (current) drug therapy: Secondary | ICD-10-CM | POA: Diagnosis not present

## 2016-12-27 DIAGNOSIS — E119 Type 2 diabetes mellitus without complications: Secondary | ICD-10-CM | POA: Insufficient documentation

## 2016-12-27 DIAGNOSIS — Z91030 Bee allergy status: Secondary | ICD-10-CM | POA: Insufficient documentation

## 2016-12-27 DIAGNOSIS — Z7982 Long term (current) use of aspirin: Secondary | ICD-10-CM | POA: Insufficient documentation

## 2016-12-27 DIAGNOSIS — E785 Hyperlipidemia, unspecified: Secondary | ICD-10-CM | POA: Diagnosis not present

## 2016-12-27 DIAGNOSIS — Z7984 Long term (current) use of oral hypoglycemic drugs: Secondary | ICD-10-CM | POA: Insufficient documentation

## 2016-12-27 DIAGNOSIS — Z91013 Allergy to seafood: Secondary | ICD-10-CM | POA: Insufficient documentation

## 2016-12-27 DIAGNOSIS — R0609 Other forms of dyspnea: Secondary | ICD-10-CM

## 2016-12-27 DIAGNOSIS — I2510 Atherosclerotic heart disease of native coronary artery without angina pectoris: Secondary | ICD-10-CM | POA: Diagnosis not present

## 2016-12-27 MED ORDER — ISOSORBIDE MONONITRATE ER 60 MG PO TB24: 60.0000 mg | ORAL_TABLET | Freq: Every day | ORAL | 3 refills | Status: DC

## 2016-12-27 NOTE — Progress Notes (Signed)
    ReDS Vest - 12/27/16 1200      ReDS Vest   MR  No   Fitting Posture Standing   Height Marker Tall   Ruler Value 13   Center Strip Aligned   ReDS Value 30

## 2016-12-27 NOTE — Progress Notes (Signed)
Advanced Heart Failure Clinic Note   Referring Physician: Dr. Domenic Polite Primary Care: Jenna Luo, MD Primary Cardiologist: Dr. Domenic Polite  HPI:  Caleb Butler is a 71 y.o. male with history of long-standing, progressive dyspnea on exertion (NYHA 2-3), Hypothyroidism, DM2, and HLD.  Referred to HF clinic for further work up of possible exercise induced pulmonary hypertension. CPX as below which showed combined cardiac and pulmonary limitation. CT chest with no significant ILD.   Right and left heart cath in Feb. 2018 showed a 90% ostial LCx lesion that involved the ostium of the Ramus as well. Also with a 40% proximal RCA lesion. It was felt at the time that this was not favorable to PCI. He underwent a Lexiscan myoview to assess ischemic burden which was not high. He was continued on medical therapy.   He presents today for follow up. He has been feeling poorly for about a month now. 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 His wife is present at the visit and says she has noticed a decline in his functional status. She is worried about him. He denies chest pain at rest, does get some chest discomfort at times but no prolonged chest pain. His main symptom is dyspnea with exertion. He is taking all of his medication, eating a low salt diet.   Cardiac cath 07/07/16   The left ventricular ejection fraction is 55-65% by visual estimate.  Prox RCA lesion, 40 %stenosed.  Ost Cx lesion, 90 %stenosed.  Ramus lesion, 90 %stenosed.  LM lesion, 20 %stenosed.  Mid LAD lesion, 20 %stenosed.  1st Diag lesion, 80 %stenosed.   Findings:  RA = 5 RV = 23/8 PA = 27/4 (16) PCW = 3 Fick cardiac output/index = 5.2/2.6 PVR = 2.5 WU  Ao sat =95% PA sat = 68%, 69%  Assessment: 1. 1-V CAD at ostium of LCX also involving ostium of ramus 2. Aneursymal proximal LAD with mild plaque 3. 40% Proximal RCA 4. Normal LVEF  with normal hemodynamics  Family History: Father had CHF (unclear etiology) and CKD (Passed at 50 yo) Mother passed of MI at 89 yo.  Social History: Lives with wife in [place]. Raises Livestock.  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  Echo 02/2016 LVEF 60-65%, Grade 2 DD, Mild AI, Trivial MR, Pa pear pressure 24 mm Hg. Normal RV.  High res Chest CT 03/13/2016 - No evidence of ILD. Mild patchy bibasilar scarring and volume loss. Incidental finding of AAA. + coronary artery calcification, + right adrenal myelolipoma.   PFTS 11/2015 FVC 2.75 (64%) FEV1 2.05 (65%) TLC 5.26 (77%) DLCO 20.70 (66%) -> corrects to 100% with alveolar volume.   CPX 05/06/16 Pre-Exercise PFTs  FVC 3.05 (74%)    FEV1 2.19 (70%)     FEV1/FVC 72 (94%)     MVV 86 (69%) Post-Exercise PFTs ((from lowest post-exercise trial (%change from rest)) FVC performed IPE, 5, 10, 15 mins FVC 3.19 (+6%) IPE    FEV1 2.44 (+11%)  10 mins     FEV1/FVC 73 (+1%)    Exercise Time:  8:30      Watts: 120 RPE: 19 Reason stopped: Patient ended test due to dyspnea (9/10) and leg fatigue Additional symptoms: Back pain (7-8/10) with weight on left leg Resting HR: 79 Peak HR: 135  (89% age predicted max HR) BP rest: 134/66 BP peak: 128/66 Peak VO2: 19.8 (86% predicted  peak VO2) VE/VCO2 slope: 44 OUES: 1.46 Peak RER: 1.11 Ventilatory Threshold: 15.2 (66% predicted or measured peak VO2) Peak RR 53 Peak Ventilation: 88.8 VE/MVV: 103% PETCO2 at peak: 22 O2pulse: 12  (92% predicted O2pulse)   Past Medical History:  Diagnosis Date  . CAD (coronary artery disease)    80% ostial lesion in L circumflex (2017)  . Hiatal hernia   . Hypothyroidism   . PMR (polymyalgia rheumatica) (HCC)   . Type 2 diabetes mellitus ([place])     Current Outpatient Prescriptions  Medication Sig Dispense Refill  . acetaminophen (TYLENOL) 325 MG tablet Take 650 mg by mouth every 4 (four)  hours as needed for mild pain.     Marland Kitchen aspirin 81 MG EC tablet Take 81 mg by mouth at bedtime.     . Glucose Blood (BLOOD GLUCOSE TEST STRIPS) STRP     . glucose blood test strip Check BS BID Dx: E11.9 100 each 5  . isosorbide mononitrate (IMDUR) 30 MG 24 hr tablet Take 1 tablet (30 mg total) by mouth daily. 90 tablet 3  . levothyroxine (SYNTHROID, LEVOTHROID) 100 MCG tablet Take 1 tablet (100 mcg total) by mouth daily before breakfast. 90 tablet 3  . metFORMIN (GLUCOPHAGE) 1000 MG tablet Take 1 tablet (1,000 mg total) by mouth 2 (two) times daily with a meal. 180 tablet 3  . Multiple Vitamin (MULTIVITAMIN WITH MINERALS) TABS tablet Take 1 tablet by mouth daily.    . predniSONE (DELTASONE) 1 MG tablet Take 4 mg by mouth daily with breakfast. Take with 5 mg tablet daily    . predniSONE (DELTASONE) 5 MG tablet Take 5 mg by mouth daily with breakfast. Take with #4 of the 1 mg tablets daily    . rosuvastatin (CRESTOR) 20 MG tablet Take 20 mg by mouth daily.    . vitamin B-12 (CYANOCOBALAMIN) 1000 MCG tablet Take 1,000 mcg by mouth daily.     No current facility-administered medications for this encounter.     Allergies  Allergen Reactions  . Bee Venom Swelling  . Ceclor [Cefaclor] Hives  . Shellfish Allergy Hives    Pt states it is questionable// he eats seafood still without problem.      Social History   Social History  . Marital status: Married    Spouse name: N/A  . Number of children: N/A  . Years of education: N/A   Occupational History  . Not on file.   Social History Main Topics  . Smoking status: Never Smoker  . Smokeless tobacco: Never Used  . Alcohol use 0.6 oz/week    1 Cans of beer per week     Comment: occ  . Drug use: No  . Sexual activity: Not on file   Other Topics Concern  . Not on file   Social History Narrative   Retired. Lives with wife, [name].      Family History  Problem Relation Age of Onset  . Diabetes Mellitus II Father   . Heart failure  Father   . Kidney failure Father    Vitals:   12/27/16 1157  BP: 104/66  Pulse: 78  SpO2: 96%  Weight: 185 lb 6.4 oz (84.1 kg)    PHYSICAL EXAM: General:  Well appearing male, NAD.  HEENT: Normal  Neck: Supple, No JVD.  Carotids 2+ bilat; no bruits. No thyromegaly or nodule noted.  Cor: PMI non displaced, regular rate and rhythm. No rubs, gallops or murmurs. Lungs: CTAB, normal effort.  Abdomen: Soft,  non tender, non distended. , no HSM. No bruits or masses. +BS  Extremities: no cyanosis, clubbing, rash. No peripheral edema  Neuro: alert & oriented x 3, cranial nerves grossly intact. moves all 4 extremities w/o difficulty. Affect pleasant.  ASSESSMENT & PLAN:  1. Dyspnea on exertion - Suspect that this is his anginal equivalent. I am concerned that his CAD has progressed despite medical therapy. Spoke with [name] who will review cath films with Dr. Burt Knack.  - We will call Mr. Quentin Ore and set up cath.   - Reds vest reading is 30 confirming that his dyspnea is not attributed to volume overload.   2. CAD with chronic stable angina - As above.  - Continue ASA. Will not start Plavix in case he needs cardiac surgery.  - Continue Crestor 20 mg daily - Will increase his isosorbide to 60 mg daily.  - I have asked him to call 911 if he develops chest pain, worsening SOB.   Follow up in one month.    Arbutus Leas, NP 12/27/16

## 2016-12-27 NOTE — Patient Instructions (Signed)
INCREASE Imdur to 60 mg, one tab daily   Your physician recommends that you schedule a follow-up appointment in: 1 month  We will be in contact with you regarding cardiac catheterization.  Your physician has requested that you have a cardiac catheterization. Cardiac catheterization is used to diagnose and/or treat various heart conditions. Doctors may recommend this procedure for a number of different reasons. The most common reason is to evaluate chest pain. Chest pain can be a symptom of coronary artery disease (CAD), and cardiac catheterization can show whether plaque is narrowing or blocking your heart's arteries. This procedure is also used to evaluate the valves, as well as measure the blood flow and oxygen levels in different parts of your heart. For further information please visit [facility]. Please follow instruction sheet, as given.  Do the following things EVERYDAY: 1) Weigh yourself in the morning before breakfast. Write it down and keep it in a log. 2) Take your medicines as prescribed 3) Eat low salt foods-Limit salt (sodium) to 2000 mg per day.  4) Stay as active as you can everyday 5) Limit all fluids for the day to less than 2 liters

## 2016-12-27 NOTE — Progress Notes (Signed)
Advanced Heart Failure Medication Review by a Pharmacist  Does the patient  feel that his/her medications are working for him/her?  yes  Has the patient been experiencing any side effects to the medications prescribed?  no  Does the patient measure his/her own blood pressure or blood glucose at home?  yes   Does the patient have any problems obtaining medications due to transportation or finances?   no  Understanding of regimen: good Understanding of indications: good Potential of compliance: good Patient understands to avoid NSAIDs. Patient understands to avoid decongestants.  Issues to address at subsequent visits: None   Pharmacist comments: Mr. Keisling is a pleasant 71 yo M presenting with his wife and a current medication list. He reports good compliance with his regimen. His only concern was with his BG being elevated recently. I have advised him to reach out to his endocrinologist for further recommendations. He did not have any other medication-related questions or concerns for me at this time.   Ruta Hinds. Velva Harman, PharmD, BCPS, CPP Clinical Pharmacist Pager: [phone] Phone: [phone] 12/27/2016 12:07 PM      Time with patient: 10 minutes Preparation and documentation time: 2 minutes Total time: 12 minutes

## 2016-12-29 ENCOUNTER — Telehealth: Payer: Self-pay

## 2016-12-29 ENCOUNTER — Encounter (HOSPITAL_COMMUNITY)
Admission: RE | Admit: 2016-12-29 | Discharge: 2016-12-29 | Disposition: A | Discharge status: Home / Self Care | Payer: Medicare Other | Source: Ambulatory Visit | Attending: Internal Medicine | Admitting: Internal Medicine

## 2016-12-29 ENCOUNTER — Telehealth (HOSPITAL_COMMUNITY): Payer: Self-pay | Admitting: *Deleted

## 2016-12-29 DIAGNOSIS — R0600 Dyspnea, unspecified: Secondary | ICD-10-CM | POA: Insufficient documentation

## 2016-12-29 DIAGNOSIS — E119 Type 2 diabetes mellitus without complications: Secondary | ICD-10-CM | POA: Insufficient documentation

## 2016-12-29 DIAGNOSIS — I25118 Atherosclerotic heart disease of native coronary artery with other forms of angina pectoris: Secondary | ICD-10-CM

## 2016-12-29 DIAGNOSIS — I2510 Atherosclerotic heart disease of native coronary artery without angina pectoris: Secondary | ICD-10-CM

## 2016-12-29 DIAGNOSIS — E039 Hypothyroidism, unspecified: Secondary | ICD-10-CM | POA: Insufficient documentation

## 2016-12-29 DIAGNOSIS — R0689 Other abnormalities of breathing: Secondary | ICD-10-CM

## 2016-12-29 MED ORDER — CLOPIDOGREL BISULFATE 75 MG PO TABS: 75.0000 mg | ORAL_TABLET | Freq: Every day | ORAL | 3 refills | Status: DC

## 2016-12-29 NOTE — Progress Notes (Signed)
Reviewed Pt chart pre cath per request by HF clinic.  Orders entered, request sent for precert.  Created instruction letter for Pt and sent via mail.  Also requested Pt receive instruction letter when labs drawn at HF clinic.   Will continue to monitor Pt and follow up closer to procedure date.

## 2016-12-29 NOTE — Progress Notes (Signed)
Daily Session Note  Patient Details  Name: Caleb Butler MRN: [id number] Date of Birth: 1945-07-20 Referring Provider:     PULMONARY REHAB OTHER RESP ORIENTATION from 09/06/2016 in [place]  Referring Provider  [name]      Encounter Date: 12/29/2016  Check In:     Session Check In - 12/29/16 1058      Check-In   Location [facility]   Staff Present Diane Angelina Pih, MS, EP, [facility], Exercise Physiologist;Erine Phenix Luther Parody, BS, EP, Exercise Physiologist   Supervising physician immediately available to respond to emergencies See telemetry face sheet for immediately available MD   Medication changes reported     No   Fall or balance concerns reported    No   Warm-up and Cool-down Performed as group-led instruction   Resistance Training Performed Yes   VAD Patient? No     Pain Assessment   Currently in Pain? No/denies   Pain Score 0-No pain   Multiple Pain Sites No      Capillary Blood Glucose: No results found for this or any previous visit (from the past 24 hour(s)).    History  Smoking Status  . Never Smoker  Smokeless Tobacco  . Never Used    Goals Met:  Independence with exercise equipment Improved SOB with ADL's Using PLB without cueing & demonstrates good technique Exercise tolerated well No report of cardiac concerns or symptoms Strength training completed today  Goals Unmet:  Not Applicable  Comments: Check out 1145   Dr. Sinda Du is Medical Director for [facility] Pulmonary Rehab.

## 2016-12-29 NOTE — Telephone Encounter (Signed)
Per Dr. Burt Knack, and agreement with [name], Pt to start Plavix 75 mg by mouth daily.  Order placed and sent to pharmacy.  Call made to Pt.  Notified of new medication prescription.  All Pt questions answered.  Will cont to monitor.

## 2016-12-29 NOTE — Telephone Encounter (Signed)
Lab orders placed per Jettie Booze, NP. CBC, BMET, PT/INR

## 2016-12-30 ENCOUNTER — Encounter: Payer: Self-pay | Admitting: Family Medicine

## 2016-12-30 ENCOUNTER — Ambulatory Visit (INDEPENDENT_AMBULATORY_CARE_PROVIDER_SITE_OTHER): Payer: Medicare Other | Admitting: Family Medicine

## 2016-12-30 VITALS — BP 90/58 | HR 92 | Temp 97.6°F | Resp 18 | Ht 69.5 in | Wt 188.0 lb

## 2016-12-30 DIAGNOSIS — IMO0002 Reserved for concepts with insufficient information to code with codable children: Secondary | ICD-10-CM

## 2016-12-30 DIAGNOSIS — E1165 Type 2 diabetes mellitus with hyperglycemia: Secondary | ICD-10-CM

## 2016-12-30 DIAGNOSIS — E1159 Type 2 diabetes mellitus with other circulatory complications: Secondary | ICD-10-CM

## 2016-12-30 NOTE — Progress Notes (Signed)
Subjective:    Patient ID: Caleb Butler, male    DOB: September 02, 1945, 71 y.o.   MRN: [id number]  Medication Refill   11/30/15 He is here to establish care.  PMH is significant for DMII, hypothyroidism, HLD.  He recently started prednisone for sciatica under the care of orthopedist.  Colonosocpy was 2013.  Overdue for prostate exam, hep c test, pneumovax, tdap, and zostavax.  He has no concerns.  AT that time, my plan was: We will check HgA1c and urine microalbumin.  Check LDL and goal is less than 100.  CHeck TSh.  Screen for hep c.  Check PSA.  Colonosocopy up to date.  Receved Tdap today.  Defers pneumovax and Zostavax until next visit.  01/18/16 Patient reports progressive shortness of breath and dyspnea on exertion. He states this is been going on for last 15 years steadily worsening. He reports having had a nuclear stress test from his previous cardiologist that was reportedly normal 2 years ago. He denies any echocardiogram. He denies any orthopnea or paroxysmal nocturnal dyspnea. However he gets easily winded just walking outside and feet his horses. He does have a history of working around large quantities of his spasticity for many years growing up a lot of exposure to agent orange in [place]. Patient was recently referred to a pulmonologist who performed pulmonary function tests which are listed in [place]. Prebronchodilator FEV1 to FVC ratio 75%. Postbronchodilator FEV1 to FVC ratio was 82% both of which are normal. However he had diminished lung volumes of 65% in both FEV1 and FVC. This suggests possible interstitial lung disease. I see no chest x-ray or CT scans performed of his lungs.  At that time, my plan was: CBC in July revealed no evidence of anemia. I will obtain previous workup performed by his cardiologist. If necessary, I'll perform an echocardiogram to evaluate for systolic or diastolic heart failure however his symptoms do not sound compatible with this. Rather I am concerned about  interstitial lung disease given his diminished lung volumes. I will obtain a CT scan of the chest to evaluate further and will likely consult pulmonology for evaluation.  08/18/16 Patient saw a pulmonologist and pulmonary workup was unremarkable aside from some scar tissue which was minimal. Was referred to cardiology. Catheterization revealed ostial stenosis in the left circumflex that was not amenable to PCI as well as a lesion in the ramus. He is being managed medically with Imdur for, Crestor, etc. He is currently taking metformin for diabetes. Patient states that his blood sugars are all less than 160. He denies any hypoglycemia. He is starting an exercise program. He denies any chest pain. Shortness of breath is stable. He denies any polyuria, polydipsia, or blurry vision. He denies any myalgias or right upper quadrant pain on Crestor. He did believe that he saw some memory loss on simvastatin. He switch to Crestor probably one week ago. At the present time he denies any side effects from Crestor.  At that time, my plan was: Blood pressure is controlled. We'll check a TSH TO ENSURE APPROPRIATE DOSE OF LEVOTHYROXINE.   Given the fact the patient  Has known coronary artery disease, I would switch from metformin to jardiance given the data on CV mortality reduction.  He would be an excellent patient for this.  Check hemoglobin A1c and if appropriate, DC metformin and replace with jardiance 25 mg poqday.    10/10/16 Ever since the patient started jardiance, the patient reports frequent polyuria. He states he  is having to go to the bathroom 5 or 6 times a day. He frequent has to wake up at night to the bathroom. However his blood sugars all ranged between 101 130. His hemoglobin A1c prior to switching from metformin to the new medication was 6.4 indicating adequate control. He is frustrated by the polyuria. He denies any dysuria, urinary incontinence, hematuria, or weak stream. About 6 weeks ago he also  developed diffuse myalgias. They involve both shoulders, both biceps, both triceps, and his hips and thighs. His been bitten by many ticks this spring and is possible he may have gotten a tickborne illness. However the muscle aches also started around the time he started Crestor.  At that time, my plan was: Discontinue jardiance and if the symptoms improve, switch the patient back to metformin. I also recommended he temporarily discontinue Crestor will check a CBC, CMP, CK, sedimentation rate, and Lyme titers. Recheck next week. If the myalgias improve, consider starting the patient back on a lower dose statin and gradually increasing.  10/14/16 Polyuria is already improving after discontinuing jardiance.  However the diffuse muscle aches are no better after stopping Crestor. It is only been 4 days. He continues complain of pain in his shoulders and in his hips as well as his arms and upper legs. EMR is certainly on the differential diagnosis. His most recent lab work as listed below. I'm reassured by his normal sedimentation rate. Lyme test is still pending.  At that time, my plan was: I still believe this is from the statins. I do not believe that he is has sufficient time for the medication to fully leave his system. I have asked for 1 week. If in 1 week, the myalgias are no better, I will consider putting the patient on prednisone for possible PMR. I believe this is less likely given his normal sedimentation rate however. His CK levels are normal ruling out myositis for the most part. I will also follow-up on the Lyme titer in one week. If negative, consider treatment for PMR if the myalgias are not improving.  10/21/16 Patient is no better. He continues to complain of severe pain in both shoulders, both triceps, both biceps, as well as both hamstrings quads and gluteus muscles. Lab work was negative for Lyme disease. He did not meet CDC criteria as he only had 2 out of the 10 proteins positive.  At that  time, my plan was: Resume Crestor. I believe this has to be PMR. Patient received 80 mg of Depo-Medrol and will start a prednisone taper pack tomorrow and then we will reassess Tuesday. If pain is improving, consult rheumatology. I will obtain x-rays of the hips, pelvis, and shoulders to rule out skeletal malignancies such as bone metastasis from an unknown primary. If the patient is no better by Tuesday, consider lab workup for multiple myeloma and a rheumatology consult for a second opinion.  12/30/16 Patient has been diagnosed with PMR and has undergone treatment for that over the last 3 months since I last saw him. However as result, he has had issues controlling his blood sugar.  He is currently on metformin 1000 mg twice daily. He brings in a month with a blood sugar readings. His fasting sugars in the morning range between 151 and 200. His two-hour postprandial sugars range between 250 and 350. There are no episodes of hypoglycemia. He scheduled to undergo angioplasty later this month. Past Medical History:  Diagnosis Date  . CAD (coronary artery disease)  80% ostial lesion in L circumflex (2017)  . Hiatal hernia   . Hypothyroidism   . PMR (polymyalgia rheumatica) (HCC)   . Type 2 diabetes mellitus ([facility])    Past Surgical History:  Procedure Laterality Date  . Arm surgery Right   . CARDIAC CATHETERIZATION    . FINGER SURGERY     Left right finger reattached.   Marland Kitchen RIGHT/LEFT HEART CATH AND CORONARY ANGIOGRAPHY N/A 07/07/2016   Procedure: Right/Left Heart Cath and Coronary Angiography;  Surgeon: Jolaine Artist, MD;  Location: [facility];  Service: Cardiovascular;  Laterality: N/A;   Current Outpatient Prescriptions on File Prior to Visit  Medication Sig Dispense Refill  . acetaminophen (TYLENOL) 325 MG tablet Take 650 mg by mouth every 4 (four) hours as needed for mild pain.     Marland Kitchen aspirin 81 MG EC tablet Take 81 mg by mouth at bedtime.     . clopidogrel (PLAVIX) 75 MG tablet  Take 1 tablet (75 mg total) by mouth daily. 90 tablet 3  . Glucose Blood (BLOOD GLUCOSE TEST STRIPS) STRP     . glucose blood test strip Check BS BID Dx: E11.9 100 each 5  . isosorbide mononitrate (IMDUR) 60 MG 24 hr tablet Take 1 tablet (60 mg total) by mouth daily. 90 tablet 3  . levothyroxine (SYNTHROID, LEVOTHROID) 100 MCG tablet Take 1 tablet (100 mcg total) by mouth daily before breakfast. 90 tablet 3  . metFORMIN (GLUCOPHAGE) 1000 MG tablet Take 1 tablet (1,000 mg total) by mouth 2 (two) times daily with a meal. 180 tablet 3  . Multiple Vitamin (MULTIVITAMIN WITH MINERALS) TABS tablet Take 1 tablet by mouth daily.    . predniSONE (DELTASONE) 1 MG tablet Take 4 mg by mouth daily with breakfast. Take with 5 mg tablet daily    . predniSONE (DELTASONE) 5 MG tablet Take 5 mg by mouth daily with breakfast. Take with #4 of the 1 mg tablets daily    . rosuvastatin (CRESTOR) 20 MG tablet Take 20 mg by mouth daily.    . vitamin B-12 (CYANOCOBALAMIN) 1000 MCG tablet Take 1,000 mcg by mouth daily.     No current facility-administered medications on file prior to visit.    Allergies  Allergen Reactions  . Bee Venom Swelling  . Ceclor [Cefaclor] Hives  . Shellfish Allergy Hives    Pt states it is questionable// he eats seafood still without problem.   Social History   Social History  . Marital status: Married    Spouse name: N/A  . Number of children: N/A  . Years of education: N/A   Occupational History  . Not on file.   Social History Main Topics  . Smoking status: Never Smoker  . Smokeless tobacco: Never Used  . Alcohol use 0.6 oz/week    1 Cans of beer per week     Comment: occ  . Drug use: No  . Sexual activity: Not on file   Other Topics Concern  . Not on file   Social History Narrative   Retired. Lives with wife, [name].    Family History  Problem Relation Age of Onset  . Diabetes Mellitus II Father   . Heart failure Father   . Kidney failure Father        Review of Systems  All other systems reviewed and are negative.      Objective:   Physical Exam  Constitutional: He is oriented to person, place, and time. He appears well-developed  and well-nourished. No distress.  HENT:  Head: Normocephalic and atraumatic.  Right Ear: External ear normal.  Left Ear: External ear normal.  Nose: Nose normal.  Mouth/Throat: Oropharynx is clear and moist. No oropharyngeal exudate.  Eyes: Pupils are equal, round, and reactive to light. Conjunctivae and EOM are normal. Right eye exhibits no discharge. Left eye exhibits no discharge. No scleral icterus.  Neck: Normal range of motion. Neck supple. No JVD present. No tracheal deviation present. No thyromegaly present.  Cardiovascular: Normal rate, regular rhythm, normal heart sounds and intact distal pulses.  Exam reveals no gallop and no friction rub.   No murmur heard. Pulmonary/Chest: Effort normal and breath sounds normal. No stridor. No respiratory distress. He has no wheezes. He has no rales. He exhibits no tenderness.  Abdominal: Soft. Bowel sounds are normal. He exhibits no distension and no mass. There is no tenderness. There is no rebound and no guarding.  Musculoskeletal: He exhibits no tenderness.  Lymphadenopathy:    He has no cervical adenopathy.  Neurological: He is alert and oriented to person, place, and time. He has normal reflexes. No cranial nerve deficit. He exhibits normal muscle tone. Coordination normal.  Skin: Skin is warm. No rash noted. He is not diaphoretic. No erythema. No pallor.  Vitals reviewed.         Assessment & Plan:  Uncontrolled DM 2  We discussed all of his options including glipizide, Actos, insulin, januvia, and trulicity/victoza.  Patient elects to try Januvia 100 mg by mouth daily in addition to his metformin and call me in 2-3 weeks with his sugars. He will also try to limit his carbohydrates to less than 45 g per meal. If his sugars are not  responding over the next 2-3 weeks, I would discontinue Januvia and replace it with either victoza or trulicity based on coverage.

## 2017-01-03 ENCOUNTER — Encounter (HOSPITAL_COMMUNITY)
Admission: RE | Admit: 2017-01-03 | Discharge: 2017-01-03 | Disposition: A | Discharge status: Home / Self Care | Payer: Medicare Other | Source: Ambulatory Visit | Attending: Internal Medicine | Admitting: Internal Medicine

## 2017-01-03 ENCOUNTER — Encounter (HOSPITAL_COMMUNITY): Payer: Self-pay

## 2017-01-03 ENCOUNTER — Other Ambulatory Visit: Payer: Self-pay | Admitting: Family Medicine

## 2017-01-03 ENCOUNTER — Ambulatory Visit (HOSPITAL_COMMUNITY)
Admission: RE | Admit: 2017-01-03 | Discharge: 2017-01-03 | Disposition: A | Discharge status: Home / Self Care | Payer: Medicare Other | Source: Ambulatory Visit | Attending: Cardiology | Admitting: Cardiology

## 2017-01-03 DIAGNOSIS — R0600 Dyspnea, unspecified: Secondary | ICD-10-CM | POA: Diagnosis not present

## 2017-01-03 DIAGNOSIS — R0689 Other abnormalities of breathing: Principal | ICD-10-CM

## 2017-01-03 DIAGNOSIS — I25118 Atherosclerotic heart disease of native coronary artery with other forms of angina pectoris: Secondary | ICD-10-CM | POA: Insufficient documentation

## 2017-01-03 LAB — BASIC METABOLIC PANEL
ANION GAP: 9 (ref 5–15)
BUN: 17 mg/dL (ref 6–20)
CHLORIDE: 102 mmol/L (ref 101–111)
CO2: 24 mmol/L (ref 22–32)
Calcium: 9.6 mg/dL (ref 8.9–10.3)
Creatinine, Ser: 1.19 mg/dL (ref 0.61–1.24)
GFR calc Af Amer: >60 mL/min (ref >60)
GLUCOSE: 203 mg/dL — AB (ref 65–99)
POTASSIUM: 4.2 mmol/L (ref 3.5–5.1)
Sodium: 135 mmol/L (ref 135–145)

## 2017-01-03 LAB — CBC
HEMATOCRIT: 43.1 % (ref 39.0–52.0)
HEMOGLOBIN: 14.8 g/dL (ref 13.0–17.0)
MCH: 31.4 pg (ref 26.0–34.0)
MCHC: 34.3 g/dL (ref 30.0–36.0)
MCV: 91.3 fL (ref 78.0–100.0)
Platelets: 170 10*3/uL (ref 150–400)
RBC: 4.72 MIL/uL (ref 4.22–5.81)
RDW: 15 % (ref 11.5–15.5)
WBC: 10.9 10*3/uL — ABNORMAL HIGH (ref 4.0–10.5)

## 2017-01-03 LAB — PROTIME-INR
INR: 1.03
Prothrombin Time: 13.5 seconds (ref 11.4–15.2)

## 2017-01-03 MED ORDER — ACCU-CHEK MULTICLIX LANCETS MISC: 12 refills | Status: DC

## 2017-01-03 NOTE — Progress Notes (Signed)
Daily Session Note  Patient Details  Name: Caleb Butler MRN: [id number] Date of Birth: 02-07-1946 Referring Provider:     PULMONARY REHAB OTHER RESP ORIENTATION from 09/06/2016 in [place]  Referring Provider  [name]      Encounter Date: 01/03/2017  Check In:     Session Check In - 01/03/17 1101      Check-In   Location [facility]   Staff Present Russella Dar, MS, EP, [facility], Exercise Physiologist;Josephene Marrone Luther Parody, BS, EP, Exercise Physiologist   Supervising physician immediately available to respond to emergencies See telemetry face sheet for immediately available MD   Medication changes reported     No   Fall or balance concerns reported    No   Warm-up and Cool-down Performed as group-led instruction   Resistance Training Performed Yes   VAD Patient? No     Pain Assessment   Currently in Pain? No/denies   Pain Score 0-No pain   Multiple Pain Sites No      Capillary Blood Glucose: No results found for this or any previous visit (from the past 24 hour(s)).    History  Smoking Status  . Never Smoker  Smokeless Tobacco  . Never Used    Goals Met:  Independence with exercise equipment Improved SOB with ADL's Using PLB without cueing & demonstrates good technique Exercise tolerated well No report of cardiac concerns or symptoms Strength training completed today  Goals Unmet:  Not Applicable  Comments: Check out 1145   Dr. Sinda Du is Medical Director for [facility].

## 2017-01-04 ENCOUNTER — Other Ambulatory Visit (HOSPITAL_COMMUNITY): Payer: Self-pay | Admitting: Internal Medicine

## 2017-01-04 NOTE — Progress Notes (Signed)
Pulmonary Individual Treatment Plan  Patient Details  Name: Caleb Butler MRN: [id number] Date of Birth: August 31, 1945 Referring Provider:     PULMONARY REHAB OTHER RESP ORIENTATION from 09/06/2016 in [place]  Referring Provider  [name]      Initial Encounter Date:    [place] from 09/06/2016 in [place]  Date  09/06/16  Referring Provider  [name]      Visit Diagnosis: Dyspnea and respiratory abnormalities  Patient's Home Medications on Admission:   Current Outpatient Prescriptions:  .  acetaminophen (TYLENOL) 325 MG tablet, Take 650 mg by mouth every 4 (four) hours as needed for mild pain. , Disp: , Rfl:  .  aspirin 81 MG EC tablet, Take 81 mg by mouth at bedtime. , Disp: , Rfl:  .  clopidogrel (PLAVIX) 75 MG tablet, Take 1 tablet (75 mg total) by mouth daily., Disp: 90 tablet, Rfl: 3 .  glucose blood test strip, Check BS BID Dx: E11.9, Disp: 100 each, Rfl: 5 .  isosorbide mononitrate (IMDUR) 60 MG 24 hr tablet, Take 1 tablet (60 mg total) by mouth daily., Disp: 90 tablet, Rfl: 3 .  Lancets (ACCU-CHEK MULTICLIX) lancets, Check BS BID Dx: E11.9, Disp: 100 each, Rfl: 12 .  levothyroxine (SYNTHROID, LEVOTHROID) 100 MCG tablet, Take 1 tablet (100 mcg total) by mouth daily before breakfast., Disp: 90 tablet, Rfl: 3 .  metFORMIN (GLUCOPHAGE) 1000 MG tablet, Take 1 tablet (1,000 mg total) by mouth 2 (two) times daily with a meal., Disp: 180 tablet, Rfl: 3 .  Multiple Vitamin (MULTIVITAMIN WITH MINERALS) TABS tablet, Take 1 tablet by mouth daily., Disp: , Rfl:  .  predniSONE (DELTASONE) 1 MG tablet, Take 4 mg by mouth daily with breakfast. Take with 5 mg tablet daily, Disp: , Rfl:  .  predniSONE (DELTASONE) 5 MG tablet, Take 5 mg by mouth daily with breakfast. Take with #4 of the 1 mg tablets daily, Disp: , Rfl:  .  rosuvastatin (CRESTOR) 20 MG tablet, Take 20 mg by mouth at bedtime. , Disp: ,  Rfl:  .  sitaGLIPtin (JANUVIA) 100 MG tablet, Take 100 mg by mouth daily., Disp: , Rfl:  .  vitamin B-12 (CYANOCOBALAMIN) 1000 MCG tablet, Take 1,000 mcg by mouth daily., Disp: , Rfl:   Past Medical History: Past Medical History:  Diagnosis Date  . CAD (coronary artery disease)    80% ostial lesion in L circumflex (2017)  . Hiatal hernia   . Hypothyroidism   . PMR (polymyalgia rheumatica) (HCC)   . Type 2 diabetes mellitus (HCC)     Tobacco Use: History  Smoking Status  . Never Smoker  Smokeless Tobacco  . Never Used    Labs: Recent Review Flowsheet Data    Labs for ITP Cardiac and Pulmonary Rehab Latest Ref Rng & Units 11/30/2015 07/07/2016 07/07/2016 07/07/2016 08/18/2016   Cholestrol 125 - 200 mg/dL 140 - - - -   LDLCALC <130 mg/dL 57 - - - -   HDL >=40 mg/dL 41 - - - -   Trlycerides <150 mg/dL 210(H) - - - -   Hemoglobin A1c <5.7 % 7.3(H) - - - 6.4(H)   PHART 7.350 - 7.450 - 7.382 - - -   PCO2ART 32.0 - 48.0 mmHg - 40.7 - - -   HCO3 20.0 - 28.0 mmol/L - 24.2 21.2 24.1 -   TCO2 0 - 100 mmol/L - _0 -   ACIDBASEDEF 0.0 -  2.0 mmol/L - 1.0 5.0(H) 2.0 -   O2SAT % - 95.0 68.0 69.0 -      Capillary Blood Glucose: Lab Results  Component Value Date   GLUCAP 205 (H) 09/08/2016   GLUCAP 97 07/07/2016   GLUCAP 148 (H) 07/07/2016     ADL UCSD:     Pulmonary Assessment Scores    Row Name 09/06/16 1336         ADL UCSD   ADL Phase Entry     SOB Score total 56     Rest 1     Walk 8     Stairs 4     Bath 1     Dress 2     Shop 2       CAT Score   CAT Score 22       mMRC Score   mMRC Score 3        Pulmonary Function Assessment:     Pulmonary Function Assessment - 09/06/16 1332      Pulmonary Function Tests   FVC% 63 %   FEV1% 69 %   FEV1/FVC Ratio 108   DLCO% 66 %     Initial Spirometry Results   FVC% 64 %   FEV1% 65 %   FEV1/FVC Ratio 75     Post Bronchodilator Spirometry Results   FVC% 63 %   FEV1% 69 %   FEV1/FVC Ratio 81     Breath    Bilateral Breath Sounds Clear   Shortness of Breath Yes  SOB with exertion      Exercise Target Goals:    Exercise Program Goal: Individual exercise prescription set with THRR, safety & activity barriers. Participant demonstrates ability to understand and report RPE using BORG scale, to self-measure pulse accurately, and to acknowledge the importance of the exercise prescription.  Exercise Prescription Goal: Starting with aerobic activity 30 plus minutes a day, 3 days per week for initial exercise prescription. Provide home exercise prescription and guidelines that participant acknowledges understanding prior to discharge.  Activity Barriers & Risk Stratification:   6 Minute Walk:     6 Minute Walk    Row Name 09/06/16 1152         6 Minute Walk   Phase Initial     Distance 1400 feet     Distance % Change 0 %     Walk Time 6 minutes     # of Rest Breaks 0     MPH 2.65     METS 3.03     RPE 9     Perceived Dyspnea  13     VO2 Peak 10.01     Symptoms No     Resting HR 83 bpm     Resting BP 100/74     Max Ex. HR 89 bpm     Max Ex. BP 114/80     2 Minute Post BP 102/76        Oxygen Initial Assessment:     Oxygen Initial Assessment - 09/06/16 1345      Home Oxygen   Home Oxygen Device None   Sleep Oxygen Prescription None   Home Exercise Oxygen Prescription None   Home at Rest Exercise Oxygen Prescription None     Initial 6 min Walk   Oxygen Used None     Program Oxygen Prescription   Program Oxygen Prescription None      Oxygen Re-Evaluation:     Oxygen Re-Evaluation    Row  Name 01/04/17 1431             Program Oxygen Prescription   Program Oxygen Prescription None         Home Oxygen   Home Oxygen Device None       Sleep Oxygen Prescription None       Home Exercise Oxygen Prescription None       Home at Rest Exercise Oxygen Prescription None          Oxygen Discharge (Final Oxygen Re-Evaluation):     Oxygen Re-Evaluation -  01/04/17 1431      Program Oxygen Prescription   Program Oxygen Prescription None     Home Oxygen   Home Oxygen Device None   Sleep Oxygen Prescription None   Home Exercise Oxygen Prescription None   Home at Rest Exercise Oxygen Prescription None      Initial Exercise Prescription:     Initial Exercise Prescription - 09/06/16 1100      Date of Initial Exercise RX and Referring Provider   Date 09/06/16   Referring Provider [name]     Treadmill   MPH 2   Grade 0   Minutes 15   METs 2.5     Recumbant Elliptical   Level 1   RPM 37   Watts 37   Minutes 20   METs 2.2     Prescription Details   Frequency (times per week) 2   Duration Progress to 30 minutes of continuous aerobic without signs/symptoms of physical distress     Intensity   THRR 40-80% of Max Heartrate [phone]   Ratings of Perceived Exertion 11-13   Perceived Dyspnea 0-4     Progression   Progression Continue progressive overload as per policy without signs/symptoms or physical distress.     Resistance Training   Training Prescription Yes   Weight 1   Reps 10-15      Perform Capillary Blood Glucose checks as needed.  Exercise Prescription Changes:      Exercise Prescription Changes    Row Name 09/19/16 1400 10/14/16 1400 10/18/16 1200 11/01/16 1100 11/10/16 0800     Response to Exercise   Blood Pressure (Admit) 110/60 98/60 102/54 100/52 102/66   Blood Pressure (Exercise) 134/72 118/66 108/60 116/64 120/58   Blood Pressure (Exit) 100/64 92/56 104/62 106/60 98/60   Heart Rate (Admit) 86 bpm 81 bpm 74 bpm 80 bpm 86 bpm   Heart Rate (Exercise) 98 bpm 89 bpm 81 bpm 87 bpm 86 bpm   Heart Rate (Exit) 79 bpm 85 bpm 73 bpm 87 bpm 96 bpm   Oxygen Saturation (Admit) 94 % 95 % 94 % 97 % 96 %   Oxygen Saturation (Exercise) 96 % 96 % 97 % 96 % 95 %   Oxygen Saturation (Exit) 96 % 96 % 96 % 95 % 96 %   Rating of Perceived Exertion (Exercise) _0 Perceived Dyspnea (Exercise) _1 Duration Progress to 30 minutes of  aerobic without signs/symptoms of physical distress Progress to 30 minutes of  aerobic without signs/symptoms of physical distress Progress to 30 minutes of  aerobic without signs/symptoms of physical distress Progress to 30 minutes of  aerobic without signs/symptoms of physical distress Progress to 30 minutes of  aerobic without signs/symptoms of physical distress   Intensity _2      Progression   Progression Continue to  progress workloads to maintain intensity without signs/symptoms of physical distress. Continue to progress workloads to maintain intensity without signs/symptoms of physical distress. Continue to progress workloads to maintain intensity without signs/symptoms of physical distress. Continue to progress workloads to maintain intensity without signs/symptoms of physical distress. Continue to progress workloads to maintain intensity without signs/symptoms of physical distress.     Resistance Training   Training Prescription _0    Weight _1 Reps 10-15 10-15 10-15 10-15 10-15     Treadmill   MPH 2.5 2.5 2.5 2.7 2.6   Grade 0 0 0 0 0.5   Minutes _2 METs 4.06 4.06 4.06 4.2 3.1     Recumbant Elliptical   Level _3 RPM 49 49 61 58 54   Watts 69 69 80 73 68   Minutes _4 METs 3.5 3.5 4.6 3 3.6     Home Exercise Plan   Plans to continue exercise at Home (comment) Home (comment) Home (comment) Home (comment) Home (comment)   Frequency Add 2 additional days to program exercise sessions. Add 2 additional days to program exercise sessions. Add 2 additional days to program exercise sessions. Add 2 additional days to program exercise sessions. Add 2 additional days to program exercise sessions.   Row Name 11/28/16 1400 12/15/16 0700 12/19/16 1500         Response to Exercise   Blood Pressure (Admit) 96/56  96/56 108/62     Blood Pressure (Exercise) 110/62 110/62 130/62     Blood Pressure (Exit) 98/70 98/70 100/50     Heart Rate (Admit) 87 bpm 87 bpm 81 bpm     Heart Rate (Exercise) 91 bpm 91 bpm 90 bpm     Heart Rate (Exit) 81 bpm 81 bpm 77 bpm     Oxygen Saturation (Admit) 97 % 97 % 95 %     Oxygen Saturation (Exercise) 96 % 96 % 97 %     Oxygen Saturation (Exit) 97 % 97 % 95 %     Rating of Perceived Exertion (Exercise) _5 Perceived Dyspnea (Exercise) _6 Duration Progress to 30 minutes of  aerobic without signs/symptoms of physical distress Progress to 30 minutes of  aerobic without signs/symptoms of physical distress Progress to 30 minutes of  aerobic without signs/symptoms of physical distress     Intensity THRR unchanged THRR unchanged THRR unchanged       Progression   Progression Continue to progress workloads to maintain intensity without signs/symptoms of physical distress. Continue to progress workloads to maintain intensity without signs/symptoms of physical distress. Continue to progress workloads to maintain intensity without signs/symptoms of physical distress.       Resistance Training   Training Prescription Yes Yes Yes     Weight _7 Reps 10-15 10-15 10-15       Treadmill   MPH 2.7 2.7  -     Grade 0.5 0.5  -     Minutes 20 20  -     METs 3.2 3.2  -       Recumbant Elliptical   Level _8 RPM 55 55 55     Watts 81 81 75     Minutes 20 20 30  METs 4.5 4.5 4.3       Home Exercise Plan   Plans to continue exercise at Home (comment) Home (comment) Home (comment)     Frequency Add 2 additional days to program exercise sessions. Add 2 additional days to program exercise sessions. Add 2 additional days to program exercise sessions.        Exercise Comments:      Exercise Comments    Row Name 09/19/16 1427 10/18/16 1249 11/01/16 1141 11/10/16 0823 11/28/16 1438   Exercise Comments Patient is very determined and is progressing  well in PR.  Patient is progressing well in PR.  Patient is doing well in PR.  Patient is doing well in PR.  Patient is doing very well in PR.    [place] Name 12/19/16 1502           Exercise Comments Patient is doing well in Pr. He is maintaining his level on the elliptical and has switched to 30 minutes on that machine.           Exercise Goals and Review:      Exercise Goals    Row Name 09/06/16 1345             Exercise Goals   Increase Physical Activity Yes       Intervention Provide advice, education, support and counseling about physical activity/exercise needs.;Develop an individualized exercise prescription for aerobic and resistive training based on initial evaluation findings, risk stratification, comorbidities and participant's personal goals.       Expected Outcomes Achievement of increased cardiorespiratory fitness and enhanced flexibility, muscular endurance and strength shown through measurements of functional capacity and personal statement of participant.       Increase Strength and Stamina Yes       Intervention Provide advice, education, support and counseling about physical activity/exercise needs.;Develop an individualized exercise prescription for aerobic and resistive training based on initial evaluation findings, risk stratification, comorbidities and participant's personal goals.       Expected Outcomes Achievement of increased cardiorespiratory fitness and enhanced flexibility, muscular endurance and strength shown through measurements of functional capacity and personal statement of participant.          Exercise Goals Re-Evaluation :     Exercise Goals Re-Evaluation    Row Name 09/22/16 6294 10/20/16 1507 11/14/16 1351 12/07/16 1417 01/04/17 1433     Exercise Goal Re-Evaluation   Exercise Goals Review Increase Physical Activity;Increase Strenth and Stamina  Breathe better; get in shape.  Increase Physical Activity;Increase Strenth and Stamina Increase  Physical Activity;Increase Strenth and Stamina Increase Physical Activity;Increase Strenth and Stamina Increase Physical Activity;Increase Strenth and Stamina   Comments After completing 5 sessions, patient has had some progression. Will continue to monitor.  Patient has completed 13 sessions. Eventhough the he has had some progression, he does not feel like his strength and stamina have increased. He hopes this will change after completing the program. 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  After completing 19 sessions, patient is progressing. He says he can not tell a difference in his strength yet but hopes it will improve as he continues the program.  Patient has completed 23 sessions with reported increased strenght and increased activity. He is progressing in weights, treadmill speed and eliptical level. Will continue to monitor.  Patient has completed 28 sessions and continues to progress with increased strength and stamina. He says he feels stronger and better overall.  Will continue to monitor.    Expected Outcomes Patient will continue to work toward his goal to beathe better and get in shape.  Patient will complete the program with increased strenght, stamina, and activity.  Patient will complete the program with increased strength, stamina, and activity.  Patient will complete the program with continued increased strength, stamina, and activity. Patient will complete the program with continued increased strength, stamina, and activity.      Discharge Exercise Prescription (Final Exercise Prescription Changes):     Exercise Prescription Changes - 12/19/16 1500      Response to Exercise   Blood Pressure (Admit) 108/62   Blood Pressure (Exercise) 130/62   Blood Pressure (Exit) 100/50   Heart Rate (Admit) 81 bpm   Heart Rate (Exercise) 90 bpm   Heart Rate (Exit) 77 bpm   Oxygen Saturation (Admit) 95 %   Oxygen Saturation (Exercise) 97 %    Oxygen Saturation (Exit) 95 %   Rating of Perceived Exertion (Exercise) 10   Perceived Dyspnea (Exercise) 10   Duration Progress to 30 minutes of  aerobic without signs/symptoms of physical distress   Intensity THRR unchanged     Progression   Progression Continue to progress workloads to maintain intensity without signs/symptoms of physical distress.     Resistance Training   Training Prescription Yes   Weight 5   Reps 10-15     Recumbant Elliptical   Level 3   RPM 55   Watts 75   Minutes 30   METs 4.3     Home Exercise Plan   Plans to continue exercise at Home (comment)   Frequency Add 2 additional days to program exercise sessions.      Nutrition:  Target Goals: Understanding of nutrition guidelines, daily intake of sodium <1513m, cholesterol <2057m calories 30% from fat and 7% or less from saturated fats, daily to have 5 or more servings of fruits and vegetables.  Biometrics:     Pre Biometrics - 09/06/16 1153      Pre Biometrics   Height _0  (1.753 m)   Weight 189 lb 6 oz (85.9 kg)   Waist Circumference 38 inches   Hip Circumference 37.5 inches   Waist to Hip Ratio 1.01 %   BMI (Calculated) 28   Triceps Skinfold 9 mm   % Body Fat 24.6 %   Grip Strength 76.67 kg   Flexibility 11 in   Single Leg Stand 6 seconds       Nutrition Therapy Plan and Nutrition Goals:   Nutrition Discharge: Rate Your Plate Scores:   Nutrition Goals Re-Evaluation:   Nutrition Goals Discharge (Final Nutrition Goals Re-Evaluation):   Psychosocial: Target Goals: Acknowledge presence or absence of significant depression and/or stress, maximize coping skills, provide positive support system. Participant is able to verbalize types and ability to use techniques and skills needed for reducing stress and depression.  Initial Review & Psychosocial Screening:     Initial Psych Review & Screening - 09/06/16 1351      Initial Review   Current issues with Current Depression   Patient feels his is depressed because he can not do any of his ADL without SOB.      Family Dynamics   Good Support System? Yes     Barriers   Psychosocial barriers to participate in program There are no identifiable barriers or psychosocial needs.     Screening Interventions   Interventions Encouraged to exercise      Quality of Life  Scores:     Quality of Life - 09/06/16 1154      Quality of Life Scores   Health/Function Pre 18.83 %   Socioeconomic Pre 27.3 %   Psych/Spiritual Pre 23 %   Family Pre 21 %   GLOBAL Pre 21.05 %      PHQ-9: Recent Review Flowsheet Data    Depression screen [facility] 2/9 09/06/2016 08/18/2016 01/18/2016   Decreased Interest 1 0 0   Down, Depressed, Hopeless 1 0 0   PHQ - 2 Score 2 0 0   Altered sleeping 3 0 -   Tired, decreased energy 3 0 -   Change in appetite 2 0 -   Feeling bad or failure about yourself  0 0 -   Trouble concentrating 1 0 -   Moving slowly or fidgety/restless 1 0 -   Suicidal thoughts 0 0 -   PHQ-9 Score 12 0 -   Difficult doing work/chores Somewhat difficult Not difficult at all -     Interpretation of Total Score  Total Score Depression Severity:  1-4 = Minimal depression, 5-9 = Mild depression, 10-14 = Moderate depression, 15-19 = Moderately severe depression, 20-27 = Severe depression   Psychosocial Evaluation and Intervention:     Psychosocial Evaluation - 09/06/16 1352      Psychosocial Evaluation & Interventions   Interventions Stress management education;Relaxation education;Encouraged to exercise with the program and follow exercise prescription   Continue Psychosocial Services  Follow up required by staff      Psychosocial Re-Evaluation:     Psychosocial Re-Evaluation    Row Name 09/22/16 2671 10/20/16 1510 11/14/16 1407 12/07/16 1419 01/04/17 1435     Psychosocial Re-Evaluation   Current issues with _0     Comments Patient is still considering counseling.  Patient is still considering seeing a couselor for his depression.  Patient continues to have depression but is not interested in treatment at this time.  Patient is not interested in treatment.  Patient is not interested in treatment.    Expected Outcomes Patient's PHQ-9 and QOL scores will improve at discharge.  Patient will have improved PHQ-9 and QOL scores at discharge and receiving treatment for his depression.  Patient will have improved QOL and PHQ-9 scores or no change at discharge.  Patient will have improved QOL and PHQ-9 scores at discharge.  Patient will have improved PHQ-9 and QOL scores at discharge.    Interventions Encouraged to attend Pulmonary Rehabilitation for the exercise Relaxation education;Stress management education;Encouraged to attend Pulmonary Rehabilitation for the exercise Relaxation education;Encouraged to attend Pulmonary Rehabilitation for the exercise;Stress management education Stress management education;Encouraged to attend Pulmonary Rehabilitation for the exercise;Relaxation education Stress management education;Encouraged to attend Pulmonary Rehabilitation for the exercise;Relaxation education   Continue Psychosocial Services  Follow up required by staff Follow up required by staff Follow up required by staff No Follow up required Follow up required by staff      Psychosocial Discharge (Final Psychosocial Re-Evaluation):     Psychosocial Re-Evaluation - 01/04/17 1435      Psychosocial Re-Evaluation   Current issues with Current Depression   Comments Patient is not interested in treatment.    Expected Outcomes Patient will have improved PHQ-9 and QOL scores at discharge.    Interventions Stress management education;Encouraged to attend Pulmonary Rehabilitation for the exercise;Relaxation education   Continue Psychosocial Services  Follow up required by staff       Education: Education Goals:  Education  classes will be provided on a weekly basis, covering required topics. Participant will state understanding/return demonstration of topics presented.  Learning Barriers/Preferences:     Learning Barriers/Preferences - 09/06/16 1208      Learning Barriers/Preferences   Learning Barriers None   Learning Preferences Skilled Demonstration;Verbal Instruction;Individual Instruction;Group Instruction      Education Topics: How Lungs Work and Diseases: - Discuss the anatomy of the lungs and diseases that can affect the lungs, such as COPD.   PULMONARY REHAB OTHER RESPIRATORY from 12/29/2016 in [place]  Date  12/29/16  Educator  [place]  Instruction Review Code  2- meets goals/outcomes      Exercise: -Discuss the importance of exercise, FITT principles of exercise, normal and abnormal responses to exercise, and how to exercise safely.   PULMONARY REHAB OTHER RESPIRATORY from 10/20/2016 in [place]  Date  10/06/16  Educator  Nils Flack  Instruction Review Code  2- meets goals/outcomes      Environmental Irritants: -Discuss types of environmental irritants and how to limit exposure to environmental irritants.   PULMONARY REHAB OTHER RESPIRATORY from 10/20/2016 in [place]  Date  10/13/16  Educator  [place]  Instruction Review Code  2- meets goals/outcomes      Meds/Inhalers and oxygen: - Discuss respiratory medications, definition of an inhaler and oxygen, and the proper way to use an inhaler and oxygen.   PULMONARY REHAB OTHER RESPIRATORY from 10/20/2016 in [place]  Date  10/20/16  Educator  [place]  Instruction Review Code  2- meets goals/outcomes      Energy Saving Techniques: - Discuss methods to conserve energy and decrease shortness of breath when performing activities of daily living.    PULMONARY REHAB OTHER RESPIRATORY from 12/29/2016 in [place]  Date  10/27/16   Educator  Nils Flack  Instruction Review Code  2- meets goals/outcomes      Bronchial Hygiene / Breathing Techniques: - Discuss breathing mechanics, pursed-lip breathing technique,  proper posture, effective ways to clear airways, and other functional breathing techniques   Cleaning Equipment: - Provides group verbal and written instruction about the health risks of elevated stress, cause of high stress, and healthy ways to reduce stress.   Nutrition I: Fats: - Discuss the types of cholesterol, what cholesterol does to the body, and how cholesterol levels can be controlled.   Nutrition II: Labels: -Discuss the different components of food labels and how to read food labels.   PULMONARY REHAB OTHER RESPIRATORY from 12/29/2016 in [place]  Date  11/24/16  Educator  [place]  Instruction Review Code  2- meets goals/outcomes      Respiratory Infections: - Discuss the signs and symptoms of respiratory infections, ways to prevent respiratory infections, and the importance of seeking medical treatment when having a respiratory infection.   PULMONARY REHAB OTHER RESPIRATORY from 12/29/2016 in [place]  Date  12/01/16  Educator  [place]  Instruction Review Code  2- meets goals/outcomes      Stress I: Signs and Symptoms: - Discuss the causes of stress, how stress may lead to anxiety and depression, and ways to limit stress.   PULMONARY REHAB OTHER RESPIRATORY from 12/29/2016 in [place]  Date  12/08/16  Educator  [place]  Instruction Review Code  2- meets goals/outcomes      Stress II: Relaxation: -Discuss relaxation techniques to limit stress.   PULMONARY REHAB OTHER RESPIRATORY  from 12/29/2016 in [place]  Date  12/15/16  Educator  DC  Instruction Review Code  2- meets goals/outcomes      Oxygen for Home/Travel: - Discuss how to prepare for travel when on oxygen and proper ways to transport and  store oxygen to ensure safety.   PULMONARY REHAB OTHER RESPIRATORY from 10/20/2016 in [place]  Date  09/22/16  Educator  [place]  Instruction Review Code  2- meets goals/outcomes      Knowledge Questionnaire Score:     Knowledge Questionnaire Score - 09/06/16 1330      Knowledge Questionnaire Score   Pre Score 9/14      Core Components/Risk Factors/Patient Goals at Admission:     Personal Goals and Risk Factors at Admission - 09/06/16 1346      Core Components/Risk Factors/Patient Goals on Admission    Weight Management Yes   Intervention Weight Management/Obesity: Establish reasonable short term and long term weight goals.   Admit Weight 189 lb (85.7 kg)   Goal Weight: Short Term 184 lb (83.5 kg)   Goal Weight: Long Term 179 lb (81.2 kg)   Expected Outcomes Short Term: Continue to assess and modify interventions until short term weight is achieved;Long Term: Adherence to nutrition and physical activity/exercise program aimed toward attainment of established weight goal   Personal Goal Other Yes   Personal Goal Breath better during activities   Intervention PR 2 x week and supplement exercise at home 3 x week. He plans to join silver sneakers at [facility] to further his exercise.    Expected Outcomes Achieve personal goals.       Core Components/Risk Factors/Patient Goals Review:      Goals and Risk Factor Review    Row Name 09/22/16 6144 10/20/16 1505 11/14/16 1348 12/07/16 1415 01/04/17 1431     Core Components/Risk Factors/Patient Goals Review   Personal Goals Review Weight Management/Obesity Weight Management/Obesity;Improve shortness of breath with ADL's Weight Management/Obesity;Improve shortness of breath with ADL's  Breathe better; get in shape.  Improve shortness of breath with ADL's;Weight Management/Obesity  Breathe better; get in shape.  Weight Management/Obesity;Improve shortness of breath with ADL's  Breathe better; get in better shape.     Review Patient has completed 5 sessions maintaining his weight. Will continue to monitor for progress.  Patient has completed 13 sessions gaining 4.7 lbs. He says his SOB has not improved. He hopes when he completes the program, he will see a difference in his SOB.  Patient has compeleted 17 sessions losing 1 lb. He is progressing in the program. He says he can not tell if he is improving or not. 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 Will continue to monitor for progress.  Patient has completed 23 sessions losing 4.5 lbs. He is doing well in the program. He says he continues to be SOB but feels like he is getting in shape and believes the program is benefiting him. Will continue to monitor.  Patient has completed 28 sessions losing 3 lbs. He continue to do well in the program with improved SOB. 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 He says the program is helping him alot.    Expected Outcomes Patient will continue to work toward his weight loss goal. Patient will complete the program and begin to work toward his weight loss goal with improved SOB.  Patient will complete the program and work toward meeting his  personal goals.  Patient will complete the program meeting his personal goals.  Patient will complete the program continuing to meet his personal goals.       Core Components/Risk Factors/Patient Goals at Discharge (Final Review):      Goals and Risk Factor Review - 01/04/17 1431      Core Components/Risk Factors/Patient Goals Review   Personal Goals Review Weight Management/Obesity;Improve shortness of breath with ADL's  Breathe better; get in better shape.    Review Patient has completed 28 sessions losing 3 lbs. He continue to do well in the program with improved SOB. 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 He says the program is helping him alot.    Expected Outcomes Patient will complete the program  continuing to meet his personal goals.       ITP Comments:     ITP Comments    Row Name 12/01/16 1551           ITP Comments Patient met with Registered Dietitian to discuss nutrition topics including: Heart healthty eating, heart healthy cooking and making smart choices when shopping; Portion control; weight management; and hydration.          Comments: ITP 30 Day REVIEW Pt is making expected progress toward pulmonary rehab goals after completing 28 sessions. Recommend continued exercise, life style modification, education, and utilization of breathing techniques to increase stamina and strength and decrease shortness of breath with exertion.

## 2017-01-05 ENCOUNTER — Encounter (HOSPITAL_COMMUNITY): Payer: Medicare Other

## 2017-01-05 ENCOUNTER — Telehealth: Payer: Self-pay

## 2017-01-05 NOTE — Telephone Encounter (Addendum)
Patient contacted pre-catheterization at [facility] scheduled for: 01/06/2017 @ 1030  Confirmed AM meds to be taken pre-cath with sip of water: Take ASA/Plavix Hold Januvia Hold Metformin-last dose Monday 01/02/2017 per Pt-resume 01/09/2017  Confirmed patient has responsible person to drive home post procedure and observe patient for 24 hours: yes

## 2017-01-06 ENCOUNTER — Encounter (HOSPITAL_COMMUNITY): Payer: Self-pay | Admitting: Cardiovascular Disease

## 2017-01-06 ENCOUNTER — Ambulatory Visit (HOSPITAL_COMMUNITY)
Admission: RE | Admit: 2017-01-06 | Discharge: 2017-01-07 | Disposition: A | Discharge status: Home / Self Care | Payer: Medicare Other | Source: Ambulatory Visit | Attending: Cardiovascular Disease | Admitting: Cardiovascular Disease

## 2017-01-06 ENCOUNTER — Encounter (HOSPITAL_COMMUNITY)
Admission: RE | Disposition: A | Discharge status: Home / Self Care | Payer: Self-pay | Source: Ambulatory Visit | Attending: Cardiovascular Disease

## 2017-01-06 DIAGNOSIS — Z7982 Long term (current) use of aspirin: Secondary | ICD-10-CM | POA: Diagnosis not present

## 2017-01-06 DIAGNOSIS — I25118 Atherosclerotic heart disease of native coronary artery with other forms of angina pectoris: Secondary | ICD-10-CM | POA: Diagnosis present

## 2017-01-06 DIAGNOSIS — E039 Hypothyroidism, unspecified: Secondary | ICD-10-CM | POA: Diagnosis not present

## 2017-01-06 DIAGNOSIS — E119 Type 2 diabetes mellitus without complications: Secondary | ICD-10-CM | POA: Diagnosis not present

## 2017-01-06 DIAGNOSIS — I10 Essential (primary) hypertension: Secondary | ICD-10-CM | POA: Diagnosis not present

## 2017-01-06 DIAGNOSIS — E785 Hyperlipidemia, unspecified: Secondary | ICD-10-CM | POA: Diagnosis not present

## 2017-01-06 DIAGNOSIS — Z7952 Long term (current) use of systemic steroids: Secondary | ICD-10-CM | POA: Diagnosis not present

## 2017-01-06 DIAGNOSIS — Z8249 Family history of ischemic heart disease and other diseases of the circulatory system: Secondary | ICD-10-CM | POA: Diagnosis not present

## 2017-01-06 DIAGNOSIS — I2510 Atherosclerotic heart disease of native coronary artery without angina pectoris: Secondary | ICD-10-CM

## 2017-01-06 DIAGNOSIS — I25119 Atherosclerotic heart disease of native coronary artery with unspecified angina pectoris: Secondary | ICD-10-CM | POA: Diagnosis not present

## 2017-01-06 DIAGNOSIS — M353 Polymyalgia rheumatica: Secondary | ICD-10-CM | POA: Diagnosis not present

## 2017-01-06 DIAGNOSIS — Z7984 Long term (current) use of oral hypoglycemic drugs: Secondary | ICD-10-CM | POA: Diagnosis not present

## 2017-01-06 DIAGNOSIS — R0609 Other forms of dyspnea: Secondary | ICD-10-CM | POA: Diagnosis present

## 2017-01-06 DIAGNOSIS — Z955 Presence of coronary angioplasty implant and graft: Secondary | ICD-10-CM

## 2017-01-06 HISTORY — DX: Unspecified osteoarthritis, unspecified site: M19.90

## 2017-01-06 HISTORY — DX: Anesthesia of skin: R20.0

## 2017-01-06 HISTORY — PX: CORONARY STENT INTERVENTION: CATH118234

## 2017-01-06 HISTORY — DX: Pure hypercholesterolemia, unspecified: E78.00

## 2017-01-06 HISTORY — DX: Low back pain: M54.5

## 2017-01-06 HISTORY — PX: CORONARY ANGIOPLASTY WITH STENT PLACEMENT: SHX49

## 2017-01-06 HISTORY — DX: Anesthesia of skin: R20.2

## 2017-01-06 HISTORY — DX: Gastro-esophageal reflux disease without esophagitis: K21.9

## 2017-01-06 HISTORY — DX: Low back pain, unspecified: M54.50

## 2017-01-06 HISTORY — DX: Other chronic pain: G89.29

## 2017-01-06 HISTORY — DX: Irritable bowel syndrome, unspecified: K58.9

## 2017-01-06 HISTORY — DX: Anxiety disorder, unspecified: F41.9

## 2017-01-06 LAB — POCT ACTIVATED CLOTTING TIME
ACTIVATED CLOTTING TIME: 246 s
Activated Clotting Time: 274 seconds

## 2017-01-06 LAB — GLUCOSE, CAPILLARY
GLUCOSE-CAPILLARY: 250 mg/dL — AB (ref 65–99)
GLUCOSE-CAPILLARY: 249 mg/dL — AB (ref 65–99)
Glucose-Capillary: 139 mg/dL — ABNORMAL HIGH (ref 65–99)
Glucose-Capillary: 197 mg/dL — ABNORMAL HIGH (ref 65–99)
Glucose-Capillary: 178 mg/dL — ABNORMAL HIGH (ref 65–99)

## 2017-01-06 SURGERY — CORONARY STENT INTERVENTION
Anesthesia: LOCAL

## 2017-01-06 MED ORDER — IOPAMIDOL (ISOVUE-370) INJECTION 76%
INTRAVENOUS | Status: AC
  Filled 2017-01-06: qty 100

## 2017-01-06 MED ORDER — IOPAMIDOL (ISOVUE-370) INJECTION 76%
INTRAVENOUS | Status: DC | PRN
  Administered 2017-01-06: 100 mL via INTRA_ARTERIAL

## 2017-01-06 MED ORDER — HYDRALAZINE HCL 20 MG/ML IJ SOLN: 5.0000 mg | INTRAMUSCULAR | Status: AC | PRN

## 2017-01-06 MED ORDER — SODIUM CHLORIDE 0.9% FLUSH: 3.0000 mL | Freq: Two times a day (BID) | INTRAVENOUS | Status: DC

## 2017-01-06 MED ORDER — MIDAZOLAM HCL 2 MG/2ML IJ SOLN
INTRAMUSCULAR | Status: AC
  Filled 2017-01-06: qty 2

## 2017-01-06 MED ORDER — VERAPAMIL HCL 2.5 MG/ML IV SOLN
INTRAVENOUS | Status: AC
  Filled 2017-01-06: qty 2

## 2017-01-06 MED ORDER — FENTANYL CITRATE (PF) 100 MCG/2ML IJ SOLN
INTRAMUSCULAR | Status: DC | PRN
  Administered 2017-01-06: 25 ug via INTRAVENOUS

## 2017-01-06 MED ORDER — PREDNISONE 1 MG PO TABS: 4.0000 mg | ORAL_TABLET | Freq: Every day | ORAL | Status: DC

## 2017-01-06 MED ORDER — MIDAZOLAM HCL 2 MG/2ML IJ SOLN
INTRAMUSCULAR | Status: DC | PRN
  Administered 2017-01-06: 2 mg via INTRAVENOUS

## 2017-01-06 MED ORDER — PREDNISONE 5 MG PO TABS: 5.0000 mg | ORAL_TABLET | Freq: Every day | ORAL | Status: DC

## 2017-01-06 MED ORDER — HEPARIN SODIUM (PORCINE) 1000 UNIT/ML IJ SOLN
INTRAMUSCULAR | Status: DC | PRN
  Administered 2017-01-06: 2000 [IU] via INTRAVENOUS
  Administered 2017-01-06: 8000 [IU] via INTRAVENOUS

## 2017-01-06 MED ORDER — NITROGLYCERIN 1 MG/10 ML FOR IR/CATH LAB
INTRA_ARTERIAL | Status: DC | PRN
  Administered 2017-01-06: 100 ug via INTRACORONARY

## 2017-01-06 MED ORDER — LINAGLIPTIN 5 MG PO TABS
5.0000 mg | ORAL_TABLET | Freq: Every day | ORAL | Status: DC
  Administered 2017-01-06 – 2017-01-07 (×2): 5 mg via ORAL
  Filled 2017-01-06 (×2): qty 1

## 2017-01-06 MED ORDER — ACETAMINOPHEN 325 MG PO TABS
650.0000 mg | ORAL_TABLET | ORAL | Status: DC | PRN
  Administered 2017-01-06 – 2017-01-07 (×2): 650 mg via ORAL
  Filled 2017-01-06 (×2): qty 2

## 2017-01-06 MED ORDER — ASPIRIN 81 MG PO CHEW: 81.0000 mg | CHEWABLE_TABLET | ORAL | Status: DC

## 2017-01-06 MED ORDER — SODIUM CHLORIDE 0.9 % WEIGHT BASED INFUSION
3.0000 mL/kg/h | INTRAVENOUS | Status: DC
  Administered 2017-01-06: 3 mL/kg/h via INTRAVENOUS

## 2017-01-06 MED ORDER — IOPAMIDOL (ISOVUE-370) INJECTION 76%
INTRAVENOUS | Status: AC
  Filled 2017-01-06: qty 50

## 2017-01-06 MED ORDER — OXYCODONE-ACETAMINOPHEN 5-325 MG PO TABS: 1.0000 | ORAL_TABLET | ORAL | Status: DC | PRN

## 2017-01-06 MED ORDER — SODIUM CHLORIDE 0.9% FLUSH: 3.0000 mL | INTRAVENOUS | Status: DC | PRN

## 2017-01-06 MED ORDER — HEPARIN SODIUM (PORCINE) 1000 UNIT/ML IJ SOLN
INTRAMUSCULAR | Status: AC
  Filled 2017-01-06: qty 1

## 2017-01-06 MED ORDER — ISOSORBIDE MONONITRATE ER 60 MG PO TB24
60.0000 mg | ORAL_TABLET | Freq: Every day | ORAL | Status: DC
  Administered 2017-01-07: 09:00:00 60 mg via ORAL
  Filled 2017-01-06: qty 1

## 2017-01-06 MED ORDER — SODIUM CHLORIDE 0.9 % WEIGHT BASED INFUSION
1.0000 mL/kg/h | INTRAVENOUS | Status: DC
  Administered 2017-01-06: 250 mL/kg/h via INTRAVENOUS

## 2017-01-06 MED ORDER — LABETALOL HCL 5 MG/ML IV SOLN: 10.0000 mg | INTRAVENOUS | Status: AC | PRN

## 2017-01-06 MED ORDER — ASPIRIN 81 MG PO CHEW
81.0000 mg | CHEWABLE_TABLET | Freq: Every day | ORAL | Status: DC
  Filled 2017-01-06: qty 1

## 2017-01-06 MED ORDER — PREDNISONE 5 MG PO TABS
9.0000 mg | ORAL_TABLET | Freq: Every day | ORAL | Status: DC
  Administered 2017-01-07: 9 mg via ORAL
  Filled 2017-01-06: qty 4

## 2017-01-06 MED ORDER — MORPHINE SULFATE (PF) 10 MG/ML IV SOLN: 2.0000 mg | INTRAVENOUS | Status: DC | PRN

## 2017-01-06 MED ORDER — HEPARIN (PORCINE) IN NACL 2-0.9 UNIT/ML-% IJ SOLN
INTRAMUSCULAR | Status: AC
  Filled 2017-01-06: qty 1000

## 2017-01-06 MED ORDER — SODIUM CHLORIDE 0.9 % WEIGHT BASED INFUSION
1.0000 mL/kg/h | INTRAVENOUS | Status: AC
  Administered 2017-01-06: 15:00:00 1 mL/kg/h via INTRAVENOUS

## 2017-01-06 MED ORDER — HEPARIN (PORCINE) IN NACL 2-0.9 UNIT/ML-% IJ SOLN
INTRAMUSCULAR | Status: DC | PRN
  Administered 2017-01-06: 10 mL via INTRA_ARTERIAL

## 2017-01-06 MED ORDER — SODIUM CHLORIDE 0.9 % IV SOLN: 250.0000 mL | INTRAVENOUS | Status: DC | PRN

## 2017-01-06 MED ORDER — HEPARIN (PORCINE) IN NACL 2-0.9 UNIT/ML-% IJ SOLN
INTRAMUSCULAR | Status: AC | PRN
  Administered 2017-01-06: 1000 mL

## 2017-01-06 MED ORDER — NITROGLYCERIN 1 MG/10 ML FOR IR/CATH LAB
INTRA_ARTERIAL | Status: AC
  Filled 2017-01-06: qty 10

## 2017-01-06 MED ORDER — CLOPIDOGREL BISULFATE 75 MG PO TABS
75.0000 mg | ORAL_TABLET | Freq: Every day | ORAL | Status: DC
  Administered 2017-01-07: 09:00:00 75 mg via ORAL
  Filled 2017-01-06: qty 1

## 2017-01-06 MED ORDER — INSULIN ASPART 100 UNIT/ML ~~LOC~~ SOLN
0.0000 [IU] | Freq: Three times a day (TID) | SUBCUTANEOUS | Status: DC
  Administered 2017-01-06: 5 [IU] via SUBCUTANEOUS
  Administered 2017-01-07: 06:00:00 3 [IU] via SUBCUTANEOUS

## 2017-01-06 MED ORDER — LEVOTHYROXINE SODIUM 100 MCG PO TABS
100.0000 ug | ORAL_TABLET | Freq: Every day | ORAL | Status: DC
  Administered 2017-01-07: 09:00:00 100 ug via ORAL
  Filled 2017-01-06: qty 1

## 2017-01-06 MED ORDER — LIDOCAINE HCL (PF) 1 % IJ SOLN
INTRAMUSCULAR | Status: AC
  Filled 2017-01-06: qty 30

## 2017-01-06 MED ORDER — ONDANSETRON HCL 4 MG/2ML IJ SOLN: 4.0000 mg | Freq: Four times a day (QID) | INTRAMUSCULAR | Status: DC | PRN

## 2017-01-06 MED ORDER — MORPHINE SULFATE (PF) 4 MG/ML IV SOLN
2.0000 mg | INTRAVENOUS | Status: DC | PRN
  Administered 2017-01-06: 11:00:00 2 mg via INTRAVENOUS
  Filled 2017-01-06: qty 1

## 2017-01-06 MED ORDER — FENTANYL CITRATE (PF) 100 MCG/2ML IJ SOLN
INTRAMUSCULAR | Status: AC
  Filled 2017-01-06: qty 2

## 2017-01-06 MED ORDER — ROSUVASTATIN CALCIUM 20 MG PO TABS
20.0000 mg | ORAL_TABLET | Freq: Every day | ORAL | Status: DC
  Administered 2017-01-06: 21:00:00 20 mg via ORAL
  Filled 2017-01-06: qty 1

## 2017-01-06 SURGICAL SUPPLY — 16 items
BALLN WOLVERINE 2.50X6 (BALLOONS) ×2
BALLN ~~LOC~~ EMERGE MR 3.0X6 (BALLOONS) ×2
BALLOON WOLVERINE 2.50X6 (BALLOONS) ×1 IMPLANT
BALLOON ~~LOC~~ EMERGE MR 3.0X6 (BALLOONS) ×1 IMPLANT
CATH LAUNCHER 6FR EBU3.5 (CATHETERS) ×2 IMPLANT
DEVICE RAD COMP TR BAND LRG (VASCULAR PRODUCTS) ×2 IMPLANT
GLIDESHEATH SLEND SS 6F .021 (SHEATH) ×2 IMPLANT
GUIDEWIRE INQWIRE 1.5J.035X260 (WIRE) ×1 IMPLANT
INQWIRE 1.5J .035X260CM (WIRE) ×2
KIT ENCORE 26 ADVANTAGE (KITS) ×2 IMPLANT
KIT HEART LEFT (KITS) ×2 IMPLANT
PACK CARDIAC CATHETERIZATION (CUSTOM PROCEDURE TRAY) ×2 IMPLANT
STENT PROMUS PREM MR 2.75X8 (Permanent Stent) ×2 IMPLANT
TRANSDUCER W/STOPCOCK (MISCELLANEOUS) ×2 IMPLANT
TUBING CIL FLEX 10 FLL-RA (TUBING) ×2 IMPLANT
WIRE COUGAR XT STRL 190CM (WIRE) ×2 IMPLANT

## 2017-01-06 NOTE — Progress Notes (Signed)
TR BAND REMOVAL  LOCATION:  right radial  DEFLATED PER PROTOCOL:  Yes.    TIME BAND OFF / DRESSING APPLIED:   1400   SITE UPON ARRIVAL:   Level 0  SITE AFTER BAND REMOVAL:  Level 0  CIRCULATION SENSATION AND MOVEMENT:  Within Normal Limits  Yes.    COMMENTS:    

## 2017-01-06 NOTE — Interval H&P Note (Signed)
Cath Lab Visit (complete for each Cath Lab visit)  Clinical Evaluation Leading to the Procedure:   ACS: No.  Non-ACS:    Anginal Classification: CCS III  Anti-ischemic medical therapy: Minimal Therapy (1 class of medications)  Non-Invasive Test Results: No non-invasive testing performed  Prior CABG: No previous CABG      History and Physical Interval Note:  01/06/2017 8:12 AM  Caleb Butler  has presented today for surgery, with the diagnosis of cad  The various methods of treatment have been discussed with the patient and family. After consideration of risks, benefits and other options for treatment, the patient has consented to  Procedure(s): CORONARY BALLOON ANGIOPLASTY (N/A) as a surgical intervention .  The patient's history has been reviewed, patient examined, no change in status, stable for surgery.  I have reviewed the patient's chart and labs.  Questions were answered to the patient's satisfaction.     Sherren Mocha

## 2017-01-06 NOTE — H&P (View-Only) (Signed)
Advanced Heart Failure Clinic Note   Referring Physician: Dr. Domenic Polite Primary Care: Jenna Luo, MD Primary Cardiologist: Dr. Domenic Polite  HPI:  Caleb Butler is a 71 y.o. male with history of long-standing, progressive dyspnea on exertion (NYHA 2-3), Hypothyroidism, DM2, and HLD.  Referred to HF clinic for further work up of possible exercise induced pulmonary hypertension. CPX as below which showed combined cardiac and pulmonary limitation. CT chest with no significant ILD.   Right and left heart cath in Feb. 2018 showed a 90% ostial LCx lesion that involved the ostium of the Ramus as well. Also with a 40% proximal RCA lesion. It was felt at the time that this was not favorable to PCI. He underwent a Lexiscan myoview to assess ischemic burden which was not high. He was continued on medical therapy.   He presents today for follow up. He has been feeling poorly for about a month now. 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 His wife is present at the visit and says she has noticed a decline in his functional status. She is worried about him. He denies chest pain at rest, does get some chest discomfort at times but no prolonged chest pain. His main symptom is dyspnea with exertion. He is taking all of his medication, eating a low salt diet.   Cardiac cath 07/07/16   The left ventricular ejection fraction is 55-65% by visual estimate.  Prox RCA lesion, 40 %stenosed.  Ost Cx lesion, 90 %stenosed.  Ramus lesion, 90 %stenosed.  LM lesion, 20 %stenosed.  Mid LAD lesion, 20 %stenosed.  1st Diag lesion, 80 %stenosed.   Findings:  RA = 5 RV = 23/8 PA = 27/4 (16) PCW = 3 Fick cardiac output/index = 5.2/2.6 PVR = 2.5 WU  Ao sat =95% PA sat = 68%, 69%  Assessment: 1. 1-V CAD at ostium of LCX also involving ostium of ramus 2. Aneursymal proximal LAD with mild plaque 3. 40% Proximal RCA 4. Normal LVEF  with normal hemodynamics  Family History: Father had CHF (unclear etiology) and CKD (Passed at 39 yo) Mother passed of MI at 45 yo.  Social History: Lives with wife in [place]. Raises Livestock.  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  Echo 02/2016 LVEF 60-65%, Grade 2 DD, Mild AI, Trivial MR, Pa pear pressure 24 mm Hg. Normal RV.  High res Chest CT 03/13/2016 - No evidence of ILD. Mild patchy bibasilar scarring and volume loss. Incidental finding of AAA. + coronary artery calcification, + right adrenal myelolipoma.   PFTS 11/2015 FVC 2.75 (64%) FEV1 2.05 (65%) TLC 5.26 (77%) DLCO 20.70 (66%) -> corrects to 100% with alveolar volume.   CPX 05/06/16 Pre-Exercise PFTs  FVC 3.05 (74%)    FEV1 2.19 (70%)     FEV1/FVC 72 (94%)     MVV 86 (69%) Post-Exercise PFTs ((from lowest post-exercise trial (%change from rest)) FVC performed IPE, 5, 10, 15 mins FVC 3.19 (+6%) IPE    FEV1 2.44 (+11%)  10 mins     FEV1/FVC 73 (+1%)    Exercise Time:  8:30      Watts: 120 RPE: 19 Reason stopped: Patient ended test due to dyspnea (9/10) and leg fatigue Additional symptoms: Back pain (7-8/10) with weight on left leg Resting HR: 79 Peak HR: 135  (89% age predicted max HR) BP rest: 134/66 BP peak: 128/66 Peak VO2: 19.8 (86% predicted  peak VO2) VE/VCO2 slope: 44 OUES: 1.46 Peak RER: 1.11 Ventilatory Threshold: 15.2 (66% predicted or measured peak VO2) Peak RR 53 Peak Ventilation: 88.8 VE/MVV: 103% PETCO2 at peak: 22 O2pulse: 12  (92% predicted O2pulse)   Past Medical History:  Diagnosis Date  . CAD (coronary artery disease)    80% ostial lesion in L circumflex (2017)  . Hiatal hernia   . Hypothyroidism   . PMR (polymyalgia rheumatica) (HCC)   . Type 2 diabetes mellitus ([place])     Current Outpatient Prescriptions  Medication Sig Dispense Refill  . acetaminophen (TYLENOL) 325 MG tablet Take 650 mg by mouth every 4 (four)  hours as needed for mild pain.     Marland Kitchen aspirin 81 MG EC tablet Take 81 mg by mouth at bedtime.     . Glucose Blood (BLOOD GLUCOSE TEST STRIPS) STRP     . glucose blood test strip Check BS BID Dx: E11.9 100 each 5  . isosorbide mononitrate (IMDUR) 30 MG 24 hr tablet Take 1 tablet (30 mg total) by mouth daily. 90 tablet 3  . levothyroxine (SYNTHROID, LEVOTHROID) 100 MCG tablet Take 1 tablet (100 mcg total) by mouth daily before breakfast. 90 tablet 3  . metFORMIN (GLUCOPHAGE) 1000 MG tablet Take 1 tablet (1,000 mg total) by mouth 2 (two) times daily with a meal. 180 tablet 3  . Multiple Vitamin (MULTIVITAMIN WITH MINERALS) TABS tablet Take 1 tablet by mouth daily.    . predniSONE (DELTASONE) 1 MG tablet Take 4 mg by mouth daily with breakfast. Take with 5 mg tablet daily    . predniSONE (DELTASONE) 5 MG tablet Take 5 mg by mouth daily with breakfast. Take with #4 of the 1 mg tablets daily    . rosuvastatin (CRESTOR) 20 MG tablet Take 20 mg by mouth daily.    . vitamin B-12 (CYANOCOBALAMIN) 1000 MCG tablet Take 1,000 mcg by mouth daily.     No current facility-administered medications for this encounter.     Allergies  Allergen Reactions  . Bee Venom Swelling  . Ceclor [Cefaclor] Hives  . Shellfish Allergy Hives    Pt states it is questionable// he eats seafood still without problem.      Social History   Social History  . Marital status: Married    Spouse name: N/A  . Number of children: N/A  . Years of education: N/A   Occupational History  . Not on file.   Social History Main Topics  . Smoking status: Never Smoker  . Smokeless tobacco: Never Used  . Alcohol use 0.6 oz/week    1 Cans of beer per week     Comment: occ  . Drug use: No  . Sexual activity: Not on file   Other Topics Concern  . Not on file   Social History Narrative   Retired. Lives with wife, [name].      Family History  Problem Relation Age of Onset  . Diabetes Mellitus II Father   . Heart failure  Father   . Kidney failure Father    Vitals:   12/27/16 1157  BP: 104/66  Pulse: 78  SpO2: 96%  Weight: 185 lb 6.4 oz (84.1 kg)    PHYSICAL EXAM: General:  Well appearing male, NAD.  HEENT: Normal  Neck: Supple, No JVD.  Carotids 2+ bilat; no bruits. No thyromegaly or nodule noted.  Cor: PMI non displaced, regular rate and rhythm. No rubs, gallops or murmurs. Lungs: CTAB, normal effort.  Abdomen: Soft,  non tender, non distended. , no HSM. No bruits or masses. +BS  Extremities: no cyanosis, clubbing, rash. No peripheral edema  Neuro: alert & oriented x 3, cranial nerves grossly intact. moves all 4 extremities w/o difficulty. Affect pleasant.  ASSESSMENT & PLAN:  1. Dyspnea on exertion - Suspect that this is his anginal equivalent. I am concerned that his CAD has progressed despite medical therapy. Spoke with [name] who will review cath films with Dr. Burt Knack.  - We will call Mr. Quentin Ore and set up cath.   - Reds vest reading is 30 confirming that his dyspnea is not attributed to volume overload.   2. CAD with chronic stable angina - As above.  - Continue ASA. Will not start Plavix in case he needs cardiac surgery.  - Continue Crestor 20 mg daily - Will increase his isosorbide to 60 mg daily.  - I have asked him to call 911 if he develops chest pain, worsening SOB.   Follow up in one month.    Arbutus Leas, NP 12/27/16

## 2017-01-06 NOTE — Care Management Note (Signed)
Case Management Note  Patient Details  Name: Caleb Butler MRN: [id number] Date of Birth: 1945-12-16  Subjective/Objective:  From home, s/p coronary stent intervention, will be on plavix.                   Action/Plan: NCM will follow for dc needs.   Expected Discharge Date:                  Expected Discharge Plan:  Home/Self Care  In-House Referral:     Discharge planning Services  CM Consult  Post Acute Care Choice:    Choice offered to:     DME Arranged:    DME Agency:     HH Arranged:    [place] Agency:     Status of Service:  Completed, signed off  If discussed at [employer] of Stay Meetings, dates discussed:    Additional Comments:  Zenon Mayo, RN 01/06/2017, 12:26 PM

## 2017-01-07 DIAGNOSIS — I25119 Atherosclerotic heart disease of native coronary artery with unspecified angina pectoris: Secondary | ICD-10-CM | POA: Diagnosis not present

## 2017-01-07 DIAGNOSIS — I10 Essential (primary) hypertension: Secondary | ICD-10-CM | POA: Diagnosis not present

## 2017-01-07 DIAGNOSIS — E119 Type 2 diabetes mellitus without complications: Secondary | ICD-10-CM | POA: Diagnosis not present

## 2017-01-07 DIAGNOSIS — E785 Hyperlipidemia, unspecified: Secondary | ICD-10-CM | POA: Diagnosis not present

## 2017-01-07 DIAGNOSIS — I25118 Atherosclerotic heart disease of native coronary artery with other forms of angina pectoris: Secondary | ICD-10-CM | POA: Diagnosis not present

## 2017-01-07 LAB — BASIC METABOLIC PANEL
ANION GAP: 8 (ref 5–15)
BUN: 16 mg/dL (ref 6–20)
CALCIUM: 8.6 mg/dL — AB (ref 8.9–10.3)
CHLORIDE: 106 mmol/L (ref 101–111)
CO2: 25 mmol/L (ref 22–32)
CREATININE: 1.03 mg/dL (ref 0.61–1.24)
Glucose, Bld: 166 mg/dL — ABNORMAL HIGH (ref 65–99)
POTASSIUM: 3.8 mmol/L (ref 3.5–5.1)
SODIUM: 139 mmol/L (ref 135–145)

## 2017-01-07 LAB — CBC
HEMATOCRIT: 39.6 % (ref 39.0–52.0)
Hemoglobin: 13.7 g/dL (ref 13.0–17.0)
MCH: 31.4 pg (ref 26.0–34.0)
MCHC: 34.6 g/dL (ref 30.0–36.0)
MCV: 90.6 fL (ref 78.0–100.0)
PLATELETS: 161 10*3/uL (ref 150–400)
RBC: 4.37 MIL/uL (ref 4.22–5.81)
RDW: 15 % (ref 11.5–15.5)
WBC: 9.5 10*3/uL (ref 4.0–10.5)

## 2017-01-07 LAB — GLUCOSE, CAPILLARY: Glucose-Capillary: 167 mg/dL — ABNORMAL HIGH (ref 65–99)

## 2017-01-07 MED ORDER — ANGIOPLASTY BOOK
Freq: Once | Status: AC
  Administered 2017-01-07: 06:00:00 1
  Filled 2017-01-07: qty 1

## 2017-01-07 MED ORDER — CLOPIDOGREL BISULFATE 75 MG PO TABS: 75.0000 mg | ORAL_TABLET | Freq: Every day | ORAL | 3 refills | Status: DC

## 2017-01-07 NOTE — Discharge Summary (Signed)
Discharge Summary    Patient ID: Caleb Butler,  MRN: [id number], DOB/AGE: 1945-10-22 71 y.o.  Admit date: 01/06/2017 Discharge date: 01/07/2017  Primary Care Provider: Jenna Luo T Primary Cardiologist: Dr. Domenic Polite  Discharge Diagnoses    Active Problems:   Exertional dyspnea   Coronary artery disease involving native coronary artery of native heart with angina pectoris [facility])   Coronary artery disease with exertional angina [facility])   History of Present Illness     Caleb Butler is a 71 y.o. male with past medical history of CAD (90% Ost LCx, 90% RI, 80% D1 and 40% proximal RCA stenosis by cath in 06/2016), HTN, HLD, and Type 2 DM who presented to [facility] on 01/06/2017 for planned PCI.   He was previously evaluated by Jettie Booze, NP on 12/27/2016 and reported worsening dyspnea on exertion along with significant fatigue. His worsening symptoms were reviewed with [name] and the concern was that dyspnea on exertion was his anginal equivalent as the REDS Vest showed no significant volume overload. Therefore a repeat cardiac catheterization was arranged.   Hospital Course     Consultants: None  This was performed on 01/06/2017 and he underwent PCI with cutting balloon angioplasty and placement of a DES to the 90% Ost LCx stenosis. He was started on DAPT with ASA and Plavix (long-term DAPT favored as the stent is along the LM bifurcation) and will continue on Imdur and Crestor. Unclear why he is not on BB therapy (consider at outpatient follow-up).   The following morning, he reported doing well. Denied any recurrent chest pain and was ambulating down the hallway without exertional dyspnea. Telemetry showed he was maintaining NSR with occasional PAC's and PVC's. Creatinine was stable at 1.03 with Hgb of 13.7. He was last examined by Dr. Stanford Breed and deemed stable for discharge. He will keep scheduled follow-up with the CHF clinic this month then transition  back to [facility] can be considered (wishes to follow with Dr. Bronson Ing). He was discharged home in good condition.  _____________  Discharge Physical Exam and Vitals Blood pressure 129/84, pulse 64, temperature 97.9 F (36.6 C), temperature source Oral, resp. rate 16, height 5\' 10"  (1.778 m), weight 193 lb 4.8 oz (87.7 kg), SpO2 98 %.  Filed Weights   01/06/17 6387 01/06/17 0948 01/07/17 0232  Weight: 185 lb (83.9 kg) 184 lb 15.5 oz (83.9 kg) 193 lb 4.8 oz (87.7 kg)   General: Pleasant Caucasian male appearing in NAD Psych: Normal affect. Neuro: Alert and oriented X 3. Moves all extremities spontaneously. HEENT: Normal  Neck: Supple without bruits or JVD. Lungs:  Resp regular and unlabored, CTA without wheezing or rales. Heart: RRR no s3, s4, or murmurs. Abdomen: Soft, non-tender, non-distended, BS + x 4.  Extremities: No clubbing, cyanosis or edema. DP/PT/Radials 2+ and equal bilaterally. Right radial cath site is stable without ecchymosis or evidence of a hematoma.   Labs & Radiologic Studies     CBC  Recent Labs  01/07/17 0424  WBC 9.5  HGB 13.7  HCT 39.6  MCV 90.6  PLT 564   Basic Metabolic Panel  Recent Labs  01/07/17 0424  NA 139  K 3.8  CL 106  CO2 25  GLUCOSE 166*  BUN 16  CREATININE 1.03  CALCIUM 8.6*   Liver Function Tests No results for input(s): AST, ALT, ALKPHOS, BILITOT, PROT, ALBUMIN in the last 72 hours. No results for input(s): LIPASE, AMYLASE in the last 72 hours. Cardiac  Enzymes No results for input(s): CKTOTAL, CKMB, CKMBINDEX, TROPONINI in the last 72 hours. BNP Invalid input(s): POCBNP D-Dimer No results for input(s): DDIMER in the last 72 hours. Hemoglobin A1C No results for input(s): HGBA1C in the last 72 hours. Fasting Lipid Panel No results for input(s): CHOL, HDL, LDLCALC, TRIG, CHOLHDL, LDLDIRECT in the last 72 hours. Thyroid Function Tests No results for input(s): TSH, T4TOTAL, T3FREE, THYROIDAB in the last 72  hours.  Invalid input(s): FREET3  Mr Lumbar Spine Wo Contrast  Result Date: 12/16/2016 CLINICAL DATA:  Acute back pain with right sciatica EXAM: MRI LUMBAR SPINE WITHOUT CONTRAST TECHNIQUE: Multiplanar, multisequence MR imaging of the lumbar spine was performed. No intravenous contrast was administered. COMPARISON:  09/05/2013 FINDINGS: Segmentation:  Standard. Alignment: Slight dextrocurvature. Slight retrolisthesis at L1-2, L2-3, and L5-S1, chronic. Vertebrae:  No fracture, evidence of discitis, or bone lesion. Conus medullaris: Extends to the L1 level and appears normal. Paraspinal and other soft tissues: No acute finding. Extensive colonic diverticulosis. Disc levels: T12- L1: Spondylosis and disc narrowing with central protrusion. No noted impingement L1-L2: Spondylosis and mild disc narrowing. Facet arthropathy with slight retrolisthesis. No impingement L2-L3: Spondylosis and mild facet arthropathy with slight retrolisthesis. The disc is narrowed and mildly bulging. No impingement L3-L4: Facet arthropathy with moderate to advanced hypertrophy. Disc narrowing with left far-lateral disc protrusion contacting the L3 nerve root, chronic. Ventral thecal sac flattening with overall good canal patency L4-L5: Asymmetric severe facet arthropathy on the right. Asymmetric rightward disc narrowing and bulging with endplate spur. No compressive stenosis L5-S1:Degenerative disc narrowing with disc bulging and endplate ridging. Facet spurring that is moderate. Bilateral foraminal impingement with L5 distortion at both foraminal exits. Widely patent spinal canal. IMPRESSION: 1. Diffuse disc and facet degeneration without notable progression since 2015. 2. L5-S1 bilateral foraminal L5 impingement. 3. L3-4 chronic leftward disc protrusion with L3 contact. 4. Diffusely patent spinal canal. Electronically Signed   By: Monte Fantasia M.D.   On: 12/16/2016 11:20     Diagnostic Studies/Procedures    Coronary Stent  Intervention: 01/06/2017 Successful PCI of severe ostial stenosis in the left circumflex, reducing the stenosis from 90% to 0% with Cutting Balloon angioplasty followed by drug-eluting stent implantation.  Recommendations: Aspirin and Plavix for a minimum of 12 months. If tolerated favor long-term DAPT as the stent is positioned at the left main bifurcation.   Disposition   Pt is being discharged home today in good condition.  Follow-up Plans & Appointments    Follow-up Information    Bensimhon, Shaune Pascal, MD Follow up on 01/26/2017.   Specialty:  Cardiology Why:  Keep your follow-up appoinment with Jettie Booze, NP (works with [name]) on 01/26/2017 at 10:30AM.  Contact information: [place] [address] [phone]          Discharge Instructions    Diet - low sodium heart healthy    Complete by:  As directed    Discharge instructions    Complete by:  As directed    PLEASE REMEMBER TO BRING ALL OF YOUR MEDICATIONS TO [place].  PLEASE ATTEND ALL SCHEDULED FOLLOW-UP APPOINTMENTS.   Activity: Increase activity slowly as tolerated. You may shower, but no soaking baths (or swimming) for 1 week. No driving for 24 hours. No lifting over 5 lbs for 1 week. No sexual activity for 1 week.   You May Return to Work: in 1 week (if applicable)  Wound Care: You may wash cath  site gently with soap and water. Keep cath site clean and dry. If you notice pain, swelling, bleeding or pus at your cath site, please call [phone].   Increase activity slowly    Complete by:  As directed       Discharge Medications     Medication List    TAKE these medications   accu-chek multiclix lancets Check BS BID Dx: E11.9   acetaminophen 325 MG tablet Commonly known as:  TYLENOL Take 650 mg by mouth every 4 (four) hours as needed for mild pain.   aspirin 81 MG EC tablet Take 81 mg by mouth at bedtime.   clopidogrel 75 MG  tablet Commonly known as:  PLAVIX Take 1 tablet (75 mg total) by mouth daily.   glucose blood test strip Check BS BID Dx: E11.9   isosorbide mononitrate 60 MG 24 hr tablet Commonly known as:  IMDUR Take 1 tablet (60 mg total) by mouth daily.   levothyroxine 100 MCG tablet Commonly known as:  SYNTHROID, LEVOTHROID Take 1 tablet (100 mcg total) by mouth daily before breakfast.   metFORMIN 1000 MG tablet Commonly known as:  GLUCOPHAGE Take 1 tablet (1,000 mg total) by mouth 2 (two) times daily with a meal. Notes to patient:  RESTART with PM DOSE ON 01/08/2017.   multivitamin with minerals Tabs tablet Take 1 tablet by mouth daily.   predniSONE 5 MG tablet Commonly known as:  DELTASONE Take 5 mg by mouth daily with breakfast. Take with #4 of the 1 mg tablets daily What changed:  Another medication with the same name was removed. Continue taking this medication, and follow the directions you see here.   rosuvastatin 20 MG tablet Commonly known as:  CRESTOR Take 20 mg by mouth at bedtime.   sitaGLIPtin 100 MG tablet Commonly known as:  JANUVIA Take 100 mg by mouth daily.   vitamin B-12 1000 MCG tablet Commonly known as:  CYANOCOBALAMIN Take 1,000 mcg by mouth daily.         Allergies Allergies  Allergen Reactions  . Bee Venom Swelling  . Ceclor [Cefaclor] Hives  . Shellfish Allergy Hives    Pt states it is questionable// he eats seafood still without problem.     Outstanding Labs/Studies   None  Duration of Discharge Encounter   Greater than 30 minutes including physician time.  Signed, Erma Heritage, PA-C 01/07/2017, 8:41 AM As above, patient seen and examined. Patient denies dyspnea or chest pain this morning. Radial cath site with no hematoma. Plan discharge on aspirin, plavix and statin. FU Dr Haroldine Laws following DC. Kirk Ruths, MD

## 2017-01-07 NOTE — Progress Notes (Signed)
CARDIAC REHAB PHASE I   Pt walking independently. Feels well although he sts he would not be having sx unless he walked farther or tried to work outside. Ed completed with good reception. He has been working hard on his diet the last few months and is no longer a junk food junkie. Will refer to [facility] at [facility]. He has been participating in Pulmonary Rehab previously. He understands the importance of Plavix/ASA. [place], ACSM 01/07/2017 9:18 AM

## 2017-01-09 NOTE — Progress Notes (Signed)
Discharge Summary  Patient Details  Name: Caleb Butler MRN: [id number] Date of Birth: 1945/06/04 Referring Provider:     PULMONARY REHAB OTHER RESP ORIENTATION from 09/06/2016 in [place]  Referring Provider  [name]       Number of Visits: 28  Reason for Discharge:  Early Exit:  Patient stopped coming after completing 28 sessions d/t stent placement procedure planned soon. He did well in the program and hopes to return for CR after his procedure.  He did not complete any discharge documentation or exit measurements.  Smoking History:  History  Smoking Status  . Never Smoker  Smokeless Tobacco  . Never Used    Diagnosis:  Dyspnea and respiratory abnormalities  ADL UCSD:     Pulmonary Assessment Scores    Row Name 09/06/16 1336         ADL UCSD   ADL Phase Entry     SOB Score total 56     Rest 1     Walk 8     Stairs 4     Bath 1     Dress 2     Shop 2       CAT Score   CAT Score 22       mMRC Score   mMRC Score 3        Initial Exercise Prescription:     Initial Exercise Prescription - 09/06/16 1100      Date of Initial Exercise RX and Referring Provider   Date 09/06/16   Referring Provider [name]     Treadmill   MPH 2   Grade 0   Minutes 15   METs 2.5     Recumbant Elliptical   Level 1   RPM 37   Watts 37   Minutes 20   METs 2.2     Prescription Details   Frequency (times per week) 2   Duration Progress to 30 minutes of continuous aerobic without signs/symptoms of physical distress     Intensity   THRR 40-80% of Max Heartrate 110-123-137   Ratings of Perceived Exertion 11-13   Perceived Dyspnea 0-4     Progression   Progression Continue progressive overload as per policy without signs/symptoms or physical distress.     Resistance Training   Training Prescription Yes   Weight 1   Reps 10-15      Discharge Exercise Prescription (Final Exercise Prescription Changes):      Exercise Prescription Changes - 12/19/16 1500      Response to Exercise   Blood Pressure (Admit) 108/62   Blood Pressure (Exercise) 130/62   Blood Pressure (Exit) 100/50   Heart Rate (Admit) 81 bpm   Heart Rate (Exercise) 90 bpm   Heart Rate (Exit) 77 bpm   Oxygen Saturation (Admit) 95 %   Oxygen Saturation (Exercise) 97 %   Oxygen Saturation (Exit) 95 %   Rating of Perceived Exertion (Exercise) 10   Perceived Dyspnea (Exercise) 10   Duration Progress to 30 minutes of  aerobic without signs/symptoms of physical distress   Intensity THRR unchanged     Progression   Progression Continue to progress workloads to maintain intensity without signs/symptoms of physical distress.     Resistance Training   Training Prescription Yes   Weight 5   Reps 10-15     Recumbant Elliptical   Level 3   RPM 55   Watts 75   Minutes 30   METs 4.3  Home Exercise Plan   Plans to continue exercise at Home (comment)   Frequency Add 2 additional days to program exercise sessions.      Functional Capacity:     6 Minute Walk    Row Name 09/06/16 1152         6 Minute Walk   Phase Initial     Distance 1400 feet     Distance % Change 0 %     Walk Time 6 minutes     # of Rest Breaks 0     MPH 2.65     METS 3.03     RPE 9     Perceived Dyspnea  13     VO2 Peak 10.01     Symptoms No     Resting HR 83 bpm     Resting BP 100/74     Max Ex. HR 89 bpm     Max Ex. BP 114/80     2 Minute Post BP 102/76        Psychological, QOL, Others - Outcomes: PHQ 2/9: Depression screen [facility] 2/9 09/06/2016 08/18/2016 01/18/2016  Decreased Interest 1 0 0  Down, Depressed, Hopeless 1 0 0  PHQ - 2 Score 2 0 0  Altered sleeping 3 0 -  Tired, decreased energy 3 0 -  Change in appetite 2 0 -  Feeling bad or failure about yourself  0 0 -  Trouble concentrating 1 0 -  Moving slowly or fidgety/restless 1 0 -  Suicidal thoughts 0 0 -  PHQ-9 Score 12 0 -  Difficult doing work/chores Somewhat  difficult Not difficult at all -    Quality of Life:     Quality of Life - 09/06/16 1154      Quality of Life Scores   Health/Function Pre 18.83 %   Socioeconomic Pre 27.3 %   Psych/Spiritual Pre 23 %   Family Pre 21 %   GLOBAL Pre 21.05 %      Personal Goals: Goals established at orientation with interventions provided to work toward goal.     Personal Goals and Risk Factors at Admission - 09/06/16 1346      Core Components/Risk Factors/Patient Goals on Admission    Weight Management Yes   Intervention Weight Management/Obesity: Establish reasonable short term and long term weight goals.   Admit Weight 189 lb (85.7 kg)   Goal Weight: Short Term 184 lb (83.5 kg)   Goal Weight: Long Term 179 lb (81.2 kg)   Expected Outcomes Short Term: Continue to assess and modify interventions until short term weight is achieved;Long Term: Adherence to nutrition and physical activity/exercise program aimed toward attainment of established weight goal   Personal Goal Other Yes   Personal Goal Breath better during activities   Intervention PR 2 x week and supplement exercise at home 3 x week. He plans to join silver sneakers at [facility] to further his exercise.    Expected Outcomes Achieve personal goals.        Personal Goals Discharge:     Goals and Risk Factor Review    Row Name 09/22/16 9735 10/20/16 1505 11/14/16 1348 12/07/16 1415 01/04/17 1431     Core Components/Risk Factors/Patient Goals Review   Personal Goals Review Weight Management/Obesity Weight Management/Obesity;Improve shortness of breath with ADL's Weight Management/Obesity;Improve shortness of breath with ADL's  Breathe better; get in shape.  Improve shortness of breath with ADL's;Weight Management/Obesity  Breathe better; get in shape.  Weight Management/Obesity;Improve shortness of  breath with ADL's  Breathe better; get in better shape.    Review Patient has completed 5 sessions maintaining his weight. Will continue to  monitor for progress.  Patient has completed 13 sessions gaining 4.7 lbs. He says his SOB has not improved. He hopes when he completes the program, he will see a difference in his SOB.  Patient has compeleted 17 sessions losing 1 lb. He is progressing in the program. He says he can not tell if he is improving or not. 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 Will continue to monitor for progress.  Patient has completed 23 sessions losing 4.5 lbs. He is doing well in the program. He says he continues to be SOB but feels like he is getting in shape and believes the program is benefiting him. Will continue to monitor.  Patient has completed 28 sessions losing 3 lbs. He continue to do well in the program with improved SOB. 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 He says the program is helping him alot.    Expected Outcomes Patient will continue to work toward his weight loss goal. Patient will complete the program and begin to work toward his weight loss goal with improved SOB.  Patient will complete the program and work toward meeting his personal goals.  Patient will complete the program meeting his personal goals.  Patient will complete the program continuing to meet his personal goals.    [place] Name 01/05/17 1321             Core Components/Risk Factors/Patient Goals Review   Personal Goals Review -          Nutrition & Weight - Outcomes:     Pre Biometrics - 09/06/16 1153      Pre Biometrics   Height 5\' 9"  (1.753 m)   Weight 189 lb 6 oz (85.9 kg)   Waist Circumference 38 inches   Hip Circumference 37.5 inches   Waist to Hip Ratio 1.01 %   BMI (Calculated) 28   Triceps Skinfold 9 mm   % Body Fat 24.6 %   Grip Strength 76.67 kg   Flexibility 11 in   Single Leg Stand 6 seconds       Nutrition:   Nutrition Discharge:   Education Questionnaire Score:     Knowledge Questionnaire Score - 09/06/16 1330      Knowledge Questionnaire  Score   Pre Score 9/14      Goals reviewed with patient; copy given to patient.

## 2017-01-09 NOTE — Progress Notes (Signed)
Pulmonary Individual Treatment Plan  Patient Details  Name: Caleb Butler MRN: [id number] Date of Birth: 08-Jul-1945 Referring Provider:     PULMONARY REHAB OTHER RESP ORIENTATION from 09/06/2016 in [place]  Referring Provider  [name]      Initial Encounter Date:    [facility] from 09/06/2016 in [place]  Date  09/06/16  Referring Provider  [name]      Visit Diagnosis: Dyspnea and respiratory abnormalities  Patient's Home Medications on Admission:   Current Outpatient Prescriptions:  .  acetaminophen (TYLENOL) 325 MG tablet, Take 650 mg by mouth every 4 (four) hours as needed for mild pain. , Disp: , Rfl:  .  aspirin 81 MG EC tablet, Take 81 mg by mouth at bedtime. , Disp: , Rfl:  .  clopidogrel (PLAVIX) 75 MG tablet, Take 1 tablet (75 mg total) by mouth daily., Disp: 90 tablet, Rfl: 3 .  glucose blood test strip, Check BS BID Dx: E11.9, Disp: 100 each, Rfl: 5 .  isosorbide mononitrate (IMDUR) 60 MG 24 hr tablet, Take 1 tablet (60 mg total) by mouth daily., Disp: 90 tablet, Rfl: 3 .  Lancets (ACCU-CHEK MULTICLIX) lancets, Check BS BID Dx: E11.9, Disp: 100 each, Rfl: 12 .  levothyroxine (SYNTHROID, LEVOTHROID) 100 MCG tablet, Take 1 tablet (100 mcg total) by mouth daily before breakfast., Disp: 90 tablet, Rfl: 3 .  metFORMIN (GLUCOPHAGE) 1000 MG tablet, Take 1 tablet (1,000 mg total) by mouth 2 (two) times daily with a meal., Disp: 180 tablet, Rfl: 3 .  Multiple Vitamin (MULTIVITAMIN WITH MINERALS) TABS tablet, Take 1 tablet by mouth daily., Disp: , Rfl:  .  predniSONE (DELTASONE) 5 MG tablet, Take 5 mg by mouth daily with breakfast. Take with #4 of the 1 mg tablets daily, Disp: , Rfl:  .  rosuvastatin (CRESTOR) 20 MG tablet, Take 20 mg by mouth at bedtime. , Disp: , Rfl:  .  sitaGLIPtin (JANUVIA) 100 MG tablet, Take 100 mg by mouth daily., Disp: , Rfl:  .  vitamin B-12 (CYANOCOBALAMIN)  1000 MCG tablet, Take 1,000 mcg by mouth daily., Disp: , Rfl:   Past Medical History: Past Medical History:  Diagnosis Date  . Anxiety   . Arthritis    "my back is loaded w/it" (01/06/2017)  . CAD (coronary artery disease)    80% ostial lesion in L circumflex (2017)  . Chronic lower back pain   . GERD (gastroesophageal reflux disease)   . Hiatal hernia   . High cholesterol   . Hypothyroidism   . IBS (irritable bowel syndrome)   . Numbness and tingling of right lower extremity    "since 11/30/2016" (01/06/2017)  . PMR (polymyalgia rheumatica) (HCC)   . Type 2 diabetes mellitus (HCC)     Tobacco Use: History  Smoking Status  . Never Smoker  Smokeless Tobacco  . Never Used    Labs: Recent Review Flowsheet Data    Labs for ITP Cardiac and Pulmonary Rehab Latest Ref Rng & Units 11/30/2015 07/07/2016 07/07/2016 07/07/2016 08/18/2016   Cholestrol 125 - 200 mg/dL 140 - - - -   LDLCALC <130 mg/dL 57 - - - -   HDL >=40 mg/dL 41 - - - -   Trlycerides <150 mg/dL 210(H) - - - -   Hemoglobin A1c <5.7 % 7.3(H) - - - 6.4(H)   PHART 7.350 - 7.450 - 7.382 - - -   PCO2ART 32.0 - 48.0 mmHg -  40.7 - - -   HCO3 20.0 - 28.0 mmol/L - 24.2 21.2 24.1 -   TCO2 0 - 100 mmol/L - '25 22 25 '$ -   ACIDBASEDEF 0.0 - 2.0 mmol/L - 1.0 5.0(H) 2.0 -   O2SAT % - 95.0 68.0 69.0 -      Capillary Blood Glucose: Lab Results  Component Value Date   GLUCAP 167 (H) 01/07/2017   GLUCAP 178 (H) 01/06/2017   GLUCAP 249 (H) 01/06/2017   GLUCAP 250 (H) 01/06/2017   GLUCAP 197 (H) 01/06/2017     ADL UCSD:     Pulmonary Assessment Scores    Row Name 09/06/16 1336         ADL UCSD   ADL Phase Entry     SOB Score total 56     Rest 1     Walk 8     Stairs 4     Bath 1     Dress 2     Shop 2       CAT Score   CAT Score 22       mMRC Score   mMRC Score 3        Pulmonary Function Assessment:     Pulmonary Function Assessment - 09/06/16 1332      Pulmonary Function Tests   FVC% 63 %   FEV1% 69 %    FEV1/FVC Ratio 108   DLCO% 66 %     Initial Spirometry Results   FVC% 64 %   FEV1% 65 %   FEV1/FVC Ratio 75     Post Bronchodilator Spirometry Results   FVC% 63 %   FEV1% 69 %   FEV1/FVC Ratio 81     Breath   Bilateral Breath Sounds Clear   Shortness of Breath Yes  SOB with exertion      Exercise Target Goals:    Exercise Program Goal: Individual exercise prescription set with THRR, safety & activity barriers. Participant demonstrates ability to understand and report RPE using BORG scale, to self-measure pulse accurately, and to acknowledge the importance of the exercise prescription.  Exercise Prescription Goal: Starting with aerobic activity 30 plus minutes a day, 3 days per week for initial exercise prescription. Provide home exercise prescription and guidelines that participant acknowledges understanding prior to discharge.  Activity Barriers & Risk Stratification:   6 Minute Walk:     6 Minute Walk    Row Name 09/06/16 1152         6 Minute Walk   Phase Initial     Distance 1400 feet     Distance % Change 0 %     Walk Time 6 minutes     # of Rest Breaks 0     MPH 2.65     METS 3.03     RPE 9     Perceived Dyspnea  13     VO2 Peak 10.01     Symptoms No     Resting HR 83 bpm     Resting BP 100/74     Max Ex. HR 89 bpm     Max Ex. BP 114/80     2 Minute Post BP 102/76        Oxygen Initial Assessment:     Oxygen Initial Assessment - 09/06/16 1345      Home Oxygen   Home Oxygen Device None   Sleep Oxygen Prescription None   Home Exercise Oxygen Prescription None   Home at Rest Exercise  Oxygen Prescription None     Initial 6 min Walk   Oxygen Used None     Program Oxygen Prescription   Program Oxygen Prescription None      Oxygen Re-Evaluation:     Oxygen Re-Evaluation    Row Name 01/04/17 1431             Program Oxygen Prescription   Program Oxygen Prescription None         Home Oxygen   Home Oxygen Device None        Sleep Oxygen Prescription None       Home Exercise Oxygen Prescription None       Home at Rest Exercise Oxygen Prescription None          Oxygen Discharge (Final Oxygen Re-Evaluation):     Oxygen Re-Evaluation - 01/04/17 1431      Program Oxygen Prescription   Program Oxygen Prescription None     Home Oxygen   Home Oxygen Device None   Sleep Oxygen Prescription None   Home Exercise Oxygen Prescription None   Home at Rest Exercise Oxygen Prescription None      Initial Exercise Prescription:     Initial Exercise Prescription - 09/06/16 1100      Date of Initial Exercise RX and Referring Provider   Date 09/06/16   Referring Provider [name]     Treadmill   MPH 2   Grade 0   Minutes 15   METs 2.5     Recumbant Elliptical   Level 1   RPM 37   Watts 37   Minutes 20   METs 2.2     Prescription Details   Frequency (times per week) 2   Duration Progress to 30 minutes of continuous aerobic without signs/symptoms of physical distress     Intensity   THRR 40-80% of Max Heartrate 110-123-137   Ratings of Perceived Exertion 11-13   Perceived Dyspnea 0-4     Progression   Progression Continue progressive overload as per policy without signs/symptoms or physical distress.     Resistance Training   Training Prescription Yes   Weight 1   Reps 10-15      Perform Capillary Blood Glucose checks as needed.  Exercise Prescription Changes:      Exercise Prescription Changes    Row Name 09/19/16 1400 10/14/16 1400 10/18/16 1200 11/01/16 1100 11/10/16 0800     Response to Exercise   Blood Pressure (Admit) 110/60 98/60 102/54 100/52 102/66   Blood Pressure (Exercise) 134/72 118/66 108/60 116/64 120/58   Blood Pressure (Exit) 100/64 92/56 104/62 106/60 98/60   Heart Rate (Admit) 86 bpm 81 bpm 74 bpm 80 bpm 86 bpm   Heart Rate (Exercise) 98 bpm 89 bpm 81 bpm 87 bpm 86 bpm   Heart Rate (Exit) 79 bpm 85 bpm 73 bpm 87 bpm 96 bpm   Oxygen Saturation (Admit) 94 % 95  % 94 % 97 % 96 %   Oxygen Saturation (Exercise) 96 % 96 % 97 % 96 % 95 %   Oxygen Saturation (Exit) 96 % 96 % 96 % 95 % 96 %   Rating of Perceived Exertion (Exercise) '11 10 11 11 12   '$ Perceived Dyspnea (Exercise) '11 10 11 11 12   '$ Duration Progress to 30 minutes of  aerobic without signs/symptoms of physical distress Progress to 30 minutes of  aerobic without signs/symptoms of physical distress Progress to 30 minutes of  aerobic without signs/symptoms of physical distress Progress  to 30 minutes of  aerobic without signs/symptoms of physical distress Progress to 30 minutes of  aerobic without signs/symptoms of physical distress   Intensity THRR unchanged THRR unchanged THRR unchanged THRR unchanged THRR unchanged     Progression   Progression Continue to progress workloads to maintain intensity without signs/symptoms of physical distress. Continue to progress workloads to maintain intensity without signs/symptoms of physical distress. Continue to progress workloads to maintain intensity without signs/symptoms of physical distress. Continue to progress workloads to maintain intensity without signs/symptoms of physical distress. Continue to progress workloads to maintain intensity without signs/symptoms of physical distress.     Resistance Training   Training Prescription Yes Yes Yes Yes Yes   Weight '4 4 4 4 4   '$ Reps 10-15 10-15 10-15 10-15 10-15     Treadmill   MPH 2.5 2.5 2.5 2.7 2.6   Grade 0 0 0 0 0.5   Minutes '15 15 15 15 20   '$ METs 4.06 4.06 4.06 4.2 3.1     Recumbant Elliptical   Level '2 2 3 3 3   '$ RPM 49 49 61 58 54   Watts 69 69 80 73 68   Minutes '20 20 20 20 20   '$ METs 3.5 3.5 4.6 3 3.6     Home Exercise Plan   Plans to continue exercise at Home (comment) Home (comment) Home (comment) Home (comment) Home (comment)   Frequency Add 2 additional days to program exercise sessions. Add 2 additional days to program exercise sessions. Add 2 additional days to program exercise sessions. Add  2 additional days to program exercise sessions. Add 2 additional days to program exercise sessions.   Row Name 11/28/16 1400 12/15/16 0700 12/19/16 1500         Response to Exercise   Blood Pressure (Admit) 96/56 96/56 108/62     Blood Pressure (Exercise) 110/62 110/62 130/62     Blood Pressure (Exit) 98/70 98/70 100/50     Heart Rate (Admit) 87 bpm 87 bpm 81 bpm     Heart Rate (Exercise) 91 bpm 91 bpm 90 bpm     Heart Rate (Exit) 81 bpm 81 bpm 77 bpm     Oxygen Saturation (Admit) 97 % 97 % 95 %     Oxygen Saturation (Exercise) 96 % 96 % 97 %     Oxygen Saturation (Exit) 97 % 97 % 95 %     Rating of Perceived Exertion (Exercise) '10 10 10     '$ Perceived Dyspnea (Exercise) '10 10 10     '$ Duration Progress to 30 minutes of  aerobic without signs/symptoms of physical distress Progress to 30 minutes of  aerobic without signs/symptoms of physical distress Progress to 30 minutes of  aerobic without signs/symptoms of physical distress     Intensity THRR unchanged THRR unchanged THRR unchanged       Progression   Progression Continue to progress workloads to maintain intensity without signs/symptoms of physical distress. Continue to progress workloads to maintain intensity without signs/symptoms of physical distress. Continue to progress workloads to maintain intensity without signs/symptoms of physical distress.       Resistance Training   Training Prescription Yes Yes Yes     Weight '5 5 5     '$ Reps 10-15 10-15 10-15       Treadmill   MPH 2.7 2.7  -     Grade 0.5 0.5  -     Minutes 20 20  -  METs 3.2 3.2  -       Recumbant Elliptical   Level '3 3 3     '$ RPM 55 55 55     Watts 81 81 75     Minutes '20 20 30     '$ METs 4.5 4.5 4.3       Home Exercise Plan   Plans to continue exercise at Home (comment) Home (comment) Home (comment)     Frequency Add 2 additional days to program exercise sessions. Add 2 additional days to program exercise sessions. Add 2 additional days to program exercise  sessions.        Exercise Comments:      Exercise Comments    Row Name 09/19/16 1427 10/18/16 1249 11/01/16 1141 11/10/16 0823 11/28/16 1438   Exercise Comments Patient is very determined and is progressing well in PR.  Patient is progressing well in PR.  Patient is doing well in PR.  Patient is doing well in PR.  Patient is doing very well in PR.    [place] Name 12/19/16 1502           Exercise Comments Patient is doing well in Pr. He is maintaining his level on the elliptical and has switched to 30 minutes on that machine.           Exercise Goals and Review:      Exercise Goals    Row Name 09/06/16 1345             Exercise Goals   Increase Physical Activity Yes       Intervention Provide advice, education, support and counseling about physical activity/exercise needs.;Develop an individualized exercise prescription for aerobic and resistive training based on initial evaluation findings, risk stratification, comorbidities and participant's personal goals.       Expected Outcomes Achievement of increased cardiorespiratory fitness and enhanced flexibility, muscular endurance and strength shown through measurements of functional capacity and personal statement of participant.       Increase Strength and Stamina Yes       Intervention Provide advice, education, support and counseling about physical activity/exercise needs.;Develop an individualized exercise prescription for aerobic and resistive training based on initial evaluation findings, risk stratification, comorbidities and participant's personal goals.       Expected Outcomes Achievement of increased cardiorespiratory fitness and enhanced flexibility, muscular endurance and strength shown through measurements of functional capacity and personal statement of participant.          Exercise Goals Re-Evaluation :     Exercise Goals Re-Evaluation    Row Name 09/22/16 5366 10/20/16 1507 11/14/16 1351 12/07/16 1417 01/04/17  1433     Exercise Goal Re-Evaluation   Exercise Goals Review Increase Physical Activity;Increase Strenth and Stamina  Breathe better; get in shape.  Increase Physical Activity;Increase Strenth and Stamina Increase Physical Activity;Increase Strenth and Stamina Increase Physical Activity;Increase Strenth and Stamina Increase Physical Activity;Increase Strenth and Stamina   Comments After completing 5 sessions, patient has had some progression. Will continue to monitor.  Patient has completed 13 sessions. Eventhough the he has had some progression, he does not feel like his strength and stamina have increased. He hopes this will change after completing the program. 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  After completing 19 sessions, patient is progressing. He says he can not tell a difference in his strength yet but hopes it will improve as he continues the program.  Patient has  completed 23 sessions with reported increased strenght and increased activity. He is progressing in weights, treadmill speed and eliptical level. Will continue to monitor.  Patient has completed 28 sessions and continues to progress with increased strength and stamina. He says he feels stronger and better overall. Will continue to monitor.    Expected Outcomes Patient will continue to work toward his goal to beathe better and get in shape.  Patient will complete the program with increased strenght, stamina, and activity.  Patient will complete the program with increased strength, stamina, and activity.  Patient will complete the program with continued increased strength, stamina, and activity. Patient will complete the program with continued increased strength, stamina, and activity.      Discharge Exercise Prescription (Final Exercise Prescription Changes):     Exercise Prescription Changes - 12/19/16 1500      Response to Exercise   Blood Pressure (Admit) 108/62   Blood  Pressure (Exercise) 130/62   Blood Pressure (Exit) 100/50   Heart Rate (Admit) 81 bpm   Heart Rate (Exercise) 90 bpm   Heart Rate (Exit) 77 bpm   Oxygen Saturation (Admit) 95 %   Oxygen Saturation (Exercise) 97 %   Oxygen Saturation (Exit) 95 %   Rating of Perceived Exertion (Exercise) 10   Perceived Dyspnea (Exercise) 10   Duration Progress to 30 minutes of  aerobic without signs/symptoms of physical distress   Intensity THRR unchanged     Progression   Progression Continue to progress workloads to maintain intensity without signs/symptoms of physical distress.     Resistance Training   Training Prescription Yes   Weight 5   Reps 10-15     Recumbant Elliptical   Level 3   RPM 55   Watts 75   Minutes 30   METs 4.3     Home Exercise Plan   Plans to continue exercise at Home (comment)   Frequency Add 2 additional days to program exercise sessions.      Nutrition:  Target Goals: Understanding of nutrition guidelines, daily intake of sodium '1500mg'$ , cholesterol '200mg'$ , calories 30% from fat and 7% or less from saturated fats, daily to have 5 or more servings of fruits and vegetables.  Biometrics:     Pre Biometrics - 09/06/16 1153      Pre Biometrics   Height '5\' 9"'$  (1.753 m)   Weight 189 lb 6 oz (85.9 kg)   Waist Circumference 38 inches   Hip Circumference 37.5 inches   Waist to Hip Ratio 1.01 %   BMI (Calculated) 28   Triceps Skinfold 9 mm   % Body Fat 24.6 %   Grip Strength 76.67 kg   Flexibility 11 in   Single Leg Stand 6 seconds       Nutrition Therapy Plan and Nutrition Goals:   Nutrition Discharge: Rate Your Plate Scores:   Nutrition Goals Re-Evaluation:   Nutrition Goals Discharge (Final Nutrition Goals Re-Evaluation):   Psychosocial: Target Goals: Acknowledge presence or absence of significant depression and/or stress, maximize coping skills, provide positive support system. Participant is able to verbalize types and ability to use techniques  and skills needed for reducing stress and depression.  Initial Review & Psychosocial Screening:     Initial Psych Review & Screening - 09/06/16 1351      Initial Review   Current issues with Current Depression  Patient feels his is depressed because he can not do any of his ADL without SOB.      Family Dynamics  Good Support System? Yes     Barriers   Psychosocial barriers to participate in program There are no identifiable barriers or psychosocial needs.     Screening Interventions   Interventions Encouraged to exercise      Quality of Life Scores:     Quality of Life - 09/06/16 1154      Quality of Life Scores   Health/Function Pre 18.83 %   Socioeconomic Pre 27.3 %   Psych/Spiritual Pre 23 %   Family Pre 21 %   GLOBAL Pre 21.05 %      PHQ-9: Recent Review Flowsheet Data    Depression screen [facility] 2/9 09/06/2016 08/18/2016 01/18/2016   Decreased Interest 1 0 0   Down, Depressed, Hopeless 1 0 0   PHQ - 2 Score 2 0 0   Altered sleeping 3 0 -   Tired, decreased energy 3 0 -   Change in appetite 2 0 -   Feeling bad or failure about yourself  0 0 -   Trouble concentrating 1 0 -   Moving slowly or fidgety/restless 1 0 -   Suicidal thoughts 0 0 -   PHQ-9 Score 12 0 -   Difficult doing work/chores Somewhat difficult Not difficult at all -     Interpretation of Total Score  Total Score Depression Severity:  1-4 = Minimal depression, 5-9 = Mild depression, 10-14 = Moderate depression, 15-19 = Moderately severe depression, 20-27 = Severe depression   Psychosocial Evaluation and Intervention:     Psychosocial Evaluation - 09/06/16 1352      Psychosocial Evaluation & Interventions   Interventions Stress management education;Relaxation education;Encouraged to exercise with the program and follow exercise prescription   Continue Psychosocial Services  Follow up required by staff      Psychosocial Re-Evaluation:     Psychosocial Re-Evaluation    Row Name 09/22/16  3295 10/20/16 1510 11/14/16 1407 12/07/16 1419 01/04/17 1435     Psychosocial Re-Evaluation   Current issues with Current Depression Current Depression Current Depression Current Depression Current Depression   Comments Patient is still considering counseling.  Patient is still considering seeing a couselor for his depression.  Patient continues to have depression but is not interested in treatment at this time.  Patient is not interested in treatment.  Patient is not interested in treatment.    Expected Outcomes Patient's PHQ-9 and QOL scores will improve at discharge.  Patient will have improved PHQ-9 and QOL scores at discharge and receiving treatment for his depression.  Patient will have improved QOL and PHQ-9 scores or no change at discharge.  Patient will have improved QOL and PHQ-9 scores at discharge.  Patient will have improved PHQ-9 and QOL scores at discharge.    Interventions Encouraged to attend Pulmonary Rehabilitation for the exercise Relaxation education;Stress management education;Encouraged to attend Pulmonary Rehabilitation for the exercise Relaxation education;Encouraged to attend Pulmonary Rehabilitation for the exercise;Stress management education Stress management education;Encouraged to attend Pulmonary Rehabilitation for the exercise;Relaxation education Stress management education;Encouraged to attend Pulmonary Rehabilitation for the exercise;Relaxation education   Continue Psychosocial Services  Follow up required by staff Follow up required by staff Follow up required by staff No Follow up required Follow up required by staff      Psychosocial Discharge (Final Psychosocial Re-Evaluation):     Psychosocial Re-Evaluation - 01/04/17 1435      Psychosocial Re-Evaluation   Current issues with Current Depression   Comments Patient is not interested in treatment.    Expected Outcomes  Patient will have improved PHQ-9 and QOL scores at discharge.    Interventions Stress  management education;Encouraged to attend Pulmonary Rehabilitation for the exercise;Relaxation education   Continue Psychosocial Services  Follow up required by staff       Education: Education Goals: Education classes will be provided on a weekly basis, covering required topics. Participant will state understanding/return demonstration of topics presented.  Learning Barriers/Preferences:     Learning Barriers/Preferences - 09/06/16 1208      Learning Barriers/Preferences   Learning Barriers None   Learning Preferences Skilled Demonstration;Verbal Instruction;Individual Instruction;Group Instruction      Education Topics: How Lungs Work and Diseases: - Discuss the anatomy of the lungs and diseases that can affect the lungs, such as COPD.   PULMONARY REHAB OTHER RESPIRATORY from 12/29/2016 in [place]  Date  12/29/16  Educator  [place]  Instruction Review Code  2- meets goals/outcomes      Exercise: -Discuss the importance of exercise, FITT principles of exercise, normal and abnormal responses to exercise, and how to exercise safely.   PULMONARY REHAB OTHER RESPIRATORY from 10/20/2016 in [place]  Date  10/06/16  Educator  Nils Flack  Instruction Review Code  2- meets goals/outcomes      Environmental Irritants: -Discuss types of environmental irritants and how to limit exposure to environmental irritants.   PULMONARY REHAB OTHER RESPIRATORY from 10/20/2016 in [place]  Date  10/13/16  Educator  [place]  Instruction Review Code  2- meets goals/outcomes      Meds/Inhalers and oxygen: - Discuss respiratory medications, definition of an inhaler and oxygen, and the proper way to use an inhaler and oxygen.   PULMONARY REHAB OTHER RESPIRATORY from 10/20/2016 in [place]  Date  10/20/16  Educator  [place]  Instruction Review Code  2- meets goals/outcomes      Energy Saving Techniques: -  Discuss methods to conserve energy and decrease shortness of breath when performing activities of daily living.    PULMONARY REHAB OTHER RESPIRATORY from 12/29/2016 in [place]  Date  10/27/16  Educator  Nils Flack  Instruction Review Code  2- meets goals/outcomes      Bronchial Hygiene / Breathing Techniques: - Discuss breathing mechanics, pursed-lip breathing technique,  proper posture, effective ways to clear airways, and other functional breathing techniques   Cleaning Equipment: - Provides group verbal and written instruction about the health risks of elevated stress, cause of high stress, and healthy ways to reduce stress.   Nutrition I: Fats: - Discuss the types of cholesterol, what cholesterol does to the body, and how cholesterol levels can be controlled.   Nutrition II: Labels: -Discuss the different components of food labels and how to read food labels.   PULMONARY REHAB OTHER RESPIRATORY from 12/29/2016 in [place]  Date  11/24/16  Educator  [place]  Instruction Review Code  2- meets goals/outcomes      Respiratory Infections: - Discuss the signs and symptoms of respiratory infections, ways to prevent respiratory infections, and the importance of seeking medical treatment when having a respiratory infection.   PULMONARY REHAB OTHER RESPIRATORY from 12/29/2016 in [place]  Date  12/01/16  Educator  [place]  Instruction Review Code  2- meets goals/outcomes      Stress I: Signs and Symptoms: - Discuss the causes of stress, how stress may lead to anxiety and depression, and ways to limit stress.   PULMONARY REHAB  OTHER RESPIRATORY from 12/29/2016 in [place]  Date  12/08/16  Educator  [place]  Instruction Review Code  2- meets goals/outcomes      Stress II: Relaxation: -Discuss relaxation techniques to limit stress.   PULMONARY REHAB OTHER RESPIRATORY from 12/29/2016 in [place]  Date  12/15/16  Educator  [place]  Instruction Review Code  2- meets goals/outcomes      Oxygen for Home/Travel: - Discuss how to prepare for travel when on oxygen and proper ways to transport and store oxygen to ensure safety.   PULMONARY REHAB OTHER RESPIRATORY from 10/20/2016 in [place]  Date  09/22/16  Educator  [place]  Instruction Review Code  2- meets goals/outcomes      Knowledge Questionnaire Score:     Knowledge Questionnaire Score - 09/06/16 1330      Knowledge Questionnaire Score   Pre Score 9/14      Core Components/Risk Factors/Patient Goals at Admission:     Personal Goals and Risk Factors at Admission - 09/06/16 1346      Core Components/Risk Factors/Patient Goals on Admission    Weight Management Yes   Intervention Weight Management/Obesity: Establish reasonable short term and long term weight goals.   Admit Weight 189 lb (85.7 kg)   Goal Weight: Short Term 184 lb (83.5 kg)   Goal Weight: Long Term 179 lb (81.2 kg)   Expected Outcomes Short Term: Continue to assess and modify interventions until short term weight is achieved;Long Term: Adherence to nutrition and physical activity/exercise program aimed toward attainment of established weight goal   Personal Goal Other Yes   Personal Goal Breath better during activities   Intervention PR 2 x week and supplement exercise at home 3 x week. He plans to join silver sneakers at [facility] to further his exercise.    Expected Outcomes Achieve personal goals.       Core Components/Risk Factors/Patient Goals Review:      Goals and Risk Factor Review    Row Name 09/22/16 1308 10/20/16 1505 11/14/16 1348 12/07/16 1415 01/04/17 1431     Core Components/Risk Factors/Patient Goals Review   Personal Goals Review Weight Management/Obesity Weight Management/Obesity;Improve shortness of breath with ADL's Weight Management/Obesity;Improve shortness of breath with ADL's  Breathe  better; get in shape.  Improve shortness of breath with ADL's;Weight Management/Obesity  Breathe better; get in shape.  Weight Management/Obesity;Improve shortness of breath with ADL's  Breathe better; get in better shape.    Review Patient has completed 5 sessions maintaining his weight. Will continue to monitor for progress.  Patient has completed 13 sessions gaining 4.7 lbs. He says his SOB has not improved. He hopes when he completes the program, he will see a difference in his SOB.  Patient has compeleted 17 sessions losing 1 lb. He is progressing in the program. He says he can not tell if he is improving or not. 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 Will continue to monitor for progress.  Patient has completed 23 sessions losing 4.5 lbs. He is doing well in the program. He says he continues to be SOB but feels like he is getting in shape and believes the program is benefiting him. Will continue to monitor.  Patient has completed 28 sessions losing 3 lbs. He continue to do well in the program with improved SOB. 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 He says the program is helping  him alot.    Expected Outcomes Patient will continue to work toward his weight loss goal. Patient will complete the program and begin to work toward his weight loss goal with improved SOB.  Patient will complete the program and work toward meeting his personal goals.  Patient will complete the program meeting his personal goals.  Patient will complete the program continuing to meet his personal goals.    [place] Name 01/05/17 1321             Core Components/Risk Factors/Patient Goals Review   Personal Goals Review -          Core Components/Risk Factors/Patient Goals at Discharge (Final Review):      Goals and Risk Factor Review - 01/05/17 1321      Core Components/Risk Factors/Patient Goals Review   Personal Goals Review --      ITP Comments:     ITP  Comments    Row Name 12/01/16 1551 01/09/17 1406         ITP Comments Patient met with Registered Dietitian to discuss nutrition topics including: Heart healthty eating, heart healthy cooking and making smart choices when shopping; Portion control; weight management; and hydration. Patient stopped coming after completing 28 sessions d/t stent placement procedure planned soon. He did well in the program and hopes to return for CR after his procedure.  He did not complete any discharge documentation or exit measurements.          Comments: Patient stopped coming to Pulmonary Rehab on 01/03/2017 after completing 28 sessions d/t stent placement procedure planned soon. He did well in the program and hopes to return for CR after his procedure. He did not complete any discharge documentation or exit measurements.

## 2017-01-10 ENCOUNTER — Encounter (HOSPITAL_COMMUNITY): Payer: Medicare Other

## 2017-01-10 MED FILL — Lidocaine HCl Local Preservative Free (PF) Inj 1%: INTRAMUSCULAR | Qty: 30 | Status: AC

## 2017-01-23 ENCOUNTER — Ambulatory Visit (INDEPENDENT_AMBULATORY_CARE_PROVIDER_SITE_OTHER): Payer: Medicare Other | Admitting: Diagnostic Neuroimaging

## 2017-01-23 ENCOUNTER — Encounter: Payer: Self-pay | Admitting: Diagnostic Neuroimaging

## 2017-01-23 ENCOUNTER — Telehealth: Payer: Self-pay | Admitting: Family Medicine

## 2017-01-23 VITALS — BP 98/61 | HR 69 | Ht 69.5 in | Wt 182.8 lb

## 2017-01-23 DIAGNOSIS — G5701 Lesion of sciatic nerve, right lower limb: Secondary | ICD-10-CM

## 2017-01-23 DIAGNOSIS — R200 Anesthesia of skin: Secondary | ICD-10-CM | POA: Diagnosis not present

## 2017-01-23 DIAGNOSIS — M21371 Foot drop, right foot: Secondary | ICD-10-CM | POA: Diagnosis not present

## 2017-01-23 MED ORDER — SITAGLIPTIN PHOSPHATE 100 MG PO TABS: 100.0000 mg | ORAL_TABLET | Freq: Every day | ORAL | 1 refills | Status: DC

## 2017-01-23 NOTE — Telephone Encounter (Signed)
Pt called and states that since starting the Januvia his blood sugars range from 102-130. So it seems to be working well. Do you want him to continue this?

## 2017-01-23 NOTE — Telephone Encounter (Signed)
Yes, those sound great

## 2017-01-23 NOTE — Telephone Encounter (Signed)
Medication called/sent to requested pharmacy and pt aware 

## 2017-01-23 NOTE — Patient Instructions (Signed)
Thank you for coming to see [place] at [facility]. I hope we have been able to provide you high quality care today.  You may receive a patient satisfaction survey over the next few weeks. We would appreciate your feedback and comments so that we may continue to improve ourselves and the health of our patients.  - I will check EMG/NCS   ~~~~~~~~~~~~~~~~~~~~~~~~~~~~~~~~~~~~~~~~~~~~~~~~~~~~~~~~~~~~~~~~~  DR. Joniece Smotherman'S GUIDE TO HAPPY AND HEALTHY LIVING These are some of my general health and wellness recommendations. Some of them may apply to you better than others. Please use common sense as you try these suggestions and feel free to ask me any questions.   ACTIVITY/FITNESS Mental, social, emotional and physical stimulation are very important for brain and body health. Try learning a new activity (arts, music, language, sports, games).  Keep moving your body to the best of your abilities. You can do this at home, inside or outside, the park, community center, gym or anywhere you like. Consider a physical therapist or personal trainer to get started. Consider the app Sworkit. Fitness trackers such as smart-watches, smart-phones or Fitbits can help as well.   NUTRITION Eat more plants: colorful vegetables, nuts, seeds and berries.  Eat less sugar, salt, preservatives and processed foods.  Avoid toxins such as cigarettes and alcohol.  Drink water when you are thirsty. Warm water with a slice of lemon is an excellent morning drink to start the day.  Consider these websites for more information The Nutrition Source (https://www.henry-hernandez.biz/) Precision Nutrition (WindowBlog.ch)   RELAXATION Consider practicing mindfulness meditation or other relaxation techniques such as deep breathing, prayer, yoga, tai chi, massage. See website mindful.org or the apps Headspace or Calm to help get started.   SLEEP Try to get at least  7-8+ hours sleep per day. Regular exercise and reduced caffeine will help you sleep better. Practice good sleep hygeine techniques. See website sleep.org for more information.   PLANNING Prepare estate planning, living will, healthcare POA documents. Sometimes this is best planned with the help of an attorney. Theconversationproject.org and agingwithdignity.org are excellent resources. \
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
## 2017-01-26 ENCOUNTER — Ambulatory Visit (HOSPITAL_COMMUNITY)
Admission: RE | Admit: 2017-01-26 | Discharge: 2017-01-26 | Disposition: A | Discharge status: Home / Self Care | Payer: Medicare Other | Source: Ambulatory Visit | Attending: Cardiology | Admitting: Cardiology

## 2017-01-26 VITALS — BP 98/62 | HR 68 | Wt 185.4 lb

## 2017-01-26 DIAGNOSIS — Z7902 Long term (current) use of antithrombotics/antiplatelets: Secondary | ICD-10-CM | POA: Diagnosis not present

## 2017-01-26 DIAGNOSIS — D1779 Benign lipomatous neoplasm of other sites: Secondary | ICD-10-CM | POA: Diagnosis not present

## 2017-01-26 DIAGNOSIS — E785 Hyperlipidemia, unspecified: Secondary | ICD-10-CM

## 2017-01-26 DIAGNOSIS — F419 Anxiety disorder, unspecified: Secondary | ICD-10-CM | POA: Insufficient documentation

## 2017-01-26 DIAGNOSIS — E7800 Pure hypercholesterolemia, unspecified: Secondary | ICD-10-CM | POA: Diagnosis not present

## 2017-01-26 DIAGNOSIS — I25118 Atherosclerotic heart disease of native coronary artery with other forms of angina pectoris: Secondary | ICD-10-CM | POA: Diagnosis not present

## 2017-01-26 DIAGNOSIS — Z7982 Long term (current) use of aspirin: Secondary | ICD-10-CM | POA: Insufficient documentation

## 2017-01-26 DIAGNOSIS — K589 Irritable bowel syndrome without diarrhea: Secondary | ICD-10-CM | POA: Diagnosis not present

## 2017-01-26 DIAGNOSIS — I2510 Atherosclerotic heart disease of native coronary artery without angina pectoris: Secondary | ICD-10-CM | POA: Diagnosis present

## 2017-01-26 DIAGNOSIS — I714 Abdominal aortic aneurysm, without rupture: Secondary | ICD-10-CM | POA: Insufficient documentation

## 2017-01-26 DIAGNOSIS — M353 Polymyalgia rheumatica: Secondary | ICD-10-CM | POA: Insufficient documentation

## 2017-01-26 DIAGNOSIS — K219 Gastro-esophageal reflux disease without esophagitis: Secondary | ICD-10-CM | POA: Insufficient documentation

## 2017-01-26 DIAGNOSIS — E039 Hypothyroidism, unspecified: Secondary | ICD-10-CM | POA: Diagnosis not present

## 2017-01-26 DIAGNOSIS — E119 Type 2 diabetes mellitus without complications: Secondary | ICD-10-CM | POA: Diagnosis not present

## 2017-01-26 DIAGNOSIS — Z7984 Long term (current) use of oral hypoglycemic drugs: Secondary | ICD-10-CM | POA: Insufficient documentation

## 2017-01-26 MED ORDER — ISOSORBIDE MONONITRATE ER 30 MG PO TB24: 30.0000 mg | ORAL_TABLET | Freq: Every day | ORAL | 3 refills | Status: DC

## 2017-01-26 NOTE — Progress Notes (Signed)
Advanced Heart Failure Clinic Note   Referring Physician: Dr. Domenic Polite Primary Care: Jenna Luo, MD Primary Cardiologist: Dr. Domenic Polite  HPI:  Caleb Butler is a 71 y.o. male with history of long-standing, progressive dyspnea on exertion (NYHA 2-3), Hypothyroidism, DM2, and HLD.  Right and left heart cath in Feb. 2018 showed a 90% ostial LCx lesion that involved the ostium of the Ramus as well. Also with a 40% proximal RCA lesion. It was felt at the time that this was not favorable to PCI. He underwent a Lexiscan myoview to assess ischemic burden which was not high. He was continued on medical therapy.   Today he returns for HF follow up. Had PCI with stent placed and started on asa and plavix. Overall feeling better. Mild dyspnea with exertion. Plans to start cardiac rehab with from [facility]. Trying to eat low salt diet and eliminate. Taking all medications.   01/06/2017  Successful PCI of severe ostial stenosis in the left circumflex, DES placed.  Recommendations: Aspirin and Plavix for a minimum of 12 months. If tolerated favor long-term DAPT as the stent is positioned at the left main bifurcation.  Cardiac cath 07/07/16   The left ventricular ejection fraction is 55-65% by visual estimate.  Prox RCA lesion, 40 %stenosed.  Ost Cx lesion, 90 %stenosed.  Ramus lesion, 90 %stenosed.  LM lesion, 20 %stenosed.  Mid LAD lesion, 20 %stenosed.  1st Diag lesion, 80 %stenosed.  Findings: RA = 5 RV = 23/8 PA = 27/4 (16) PCW = 3 Fick cardiac output/index = 5.2/2.6 PVR = 2.5 WU  Ao sat =95% PA sat = 68%, 69% Assessment: 1. 1-V CAD at ostium of LCX also involving ostium of ramus 2. Aneursymal proximal LAD with mild plaque 3. 40% Proximal RCA 4. Normal LVEF with normal hemodynamics  Echo 02/2016 LVEF 60-65%, Grade 2 DD, Mild AI, Trivial MR, Pa pear pressure 24 mm Hg. Normal RV.  High res Chest CT 03/13/2016 - No evidence of ILD. Mild patchy bibasilar scarring and  volume loss. Incidental finding of AAA. + coronary artery calcification, + right adrenal myelolipoma.   PFTS 11/2015 FVC 2.75 (64%) FEV1 2.05 (65%) TLC 5.26 (77%) DLCO 20.70 (66%) -> corrects to 100% with alveolar volume.   CPX 05/06/16 Pre-Exercise PFTs  FVC 3.05 (74%)    FEV1 2.19 (70%)       FEV1/FVC 72 (94%)     MVV 86 (69%) Post-Exercise PFTs ((from lowest post-exercise trial (%change from rest)) FVC performed IPE, 5, 10, 15 mins FVC 3.19 (+6%) IPE    FEV1 2.44 (+11%)  10 mins     FEV1/FVC 73 (+1%)    Exercise Time:  8:30      Watts: 120 RPE: 19 Reason stopped: Patient ended test due to dyspnea (9/10) and leg fatigue Additional symptoms: Back pain (7-8/10) with weight on left leg Resting HR: 79 Peak HR: 135  (89% age predicted max HR) BP rest: 134/66 BP peak: 128/66 Peak VO2: 19.8 (86% predicted peak VO2) VE/VCO2 slope: 44 OUES: 1.46 Peak RER: 1.11 Ventilatory Threshold: 15.2 (66% predicted or measured peak VO2) Peak RR 53 Peak Ventilation: 88.8 VE/MVV: 103% PETCO2 at peak: 22 O2pulse: 12  (92% predicted O2pulse)  Family History: Father had CHF (unclear etiology) and CKD (Passed at 59 yo) Mother passed of MI at 22 yo.  Social History: Lives with wife in [place]. Raises Livestock.  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    Past Medical History:  Diagnosis Date  .  Anxiety   . Arthritis    "my back is loaded w/it" (01/06/2017)  . CAD (coronary artery disease)    80% ostial lesion in L circumflex (2017)  . Chronic lower back pain   . GERD (gastroesophageal reflux disease)   . Hiatal hernia   . High cholesterol   . Hypothyroidism   . IBS (irritable bowel syndrome)   . Numbness and tingling of right lower extremity    "since 11/30/2016" (01/06/2017)  . PMR (polymyalgia rheumatica) (HCC)   . Type 2 diabetes mellitus ([place])     Current Outpatient Prescriptions  Medication Sig Dispense Refill  .  acetaminophen (TYLENOL) 325 MG tablet Take 650 mg by mouth every 4 (four) hours as needed for mild pain.     Marland Kitchen aspirin 81 MG EC tablet Take 81 mg by mouth at bedtime.     . clopidogrel (PLAVIX) 75 MG tablet Take 1 tablet (75 mg total) by mouth daily. 90 tablet 3  . glucose blood test strip Check BS BID Dx: E11.9 100 each 5  . isosorbide mononitrate (IMDUR) 30 MG 24 hr tablet Take 1 tablet (30 mg total) by mouth daily. 90 tablet 3  . Lancets (ACCU-CHEK MULTICLIX) lancets Check BS BID Dx: E11.9 100 each 12  . levothyroxine (SYNTHROID, LEVOTHROID) 100 MCG tablet Take 1 tablet (100 mcg total) by mouth daily before breakfast. 90 tablet 3  . metFORMIN (GLUCOPHAGE) 1000 MG tablet Take 1 tablet (1,000 mg total) by mouth 2 (two) times daily with a meal. 180 tablet 3  . Multiple Vitamin (MULTIVITAMIN WITH MINERALS) TABS tablet Take 1 tablet by mouth daily.    . predniSONE (DELTASONE) 5 MG tablet Take 5 mg by mouth daily with breakfast. Take with #4 of the 1 mg tablets daily    . rosuvastatin (CRESTOR) 20 MG tablet Take 20 mg by mouth at bedtime.     . sitaGLIPtin (JANUVIA) 100 MG tablet Take 1 tablet (100 mg total) by mouth daily. 90 tablet 1  . vitamin B-12 (CYANOCOBALAMIN) 1000 MCG tablet Take 1,000 mcg by mouth daily.     No current facility-administered medications for this encounter.     Allergies  Allergen Reactions  . Bee Venom Swelling  . Ceclor [Cefaclor] Hives  . Shellfish Allergy Hives    Pt states it is questionable// he eats seafood still without problem.      Social History   Social History  . Marital status: Married    Spouse name: [name]  . Number of children: 0  . Years of education: 12   Occupational History  .      retired   Social History Main Topics  . Smoking status: Never Smoker  . Smokeless tobacco: Never Used  . Alcohol use 1.2 oz/week    1 Glasses of wine, 1 Cans of beer per week     Comment: 1 beer/week  . Drug use: No  . Sexual activity: Not Currently     Other Topics Concern  . Not on file   Social History Narrative   Retired. Lives with wife, [name].    Caffeine- coffee, 1 daily     Family History  Problem Relation Age of Onset  . Diabetes Mellitus II Father   . Heart failure Father   . Kidney failure Father   . Heart attack Mother    Vitals:   01/26/17 1025  BP: 98/62  Pulse: 68  SpO2: 98%  Weight: 185 lb 6.4 oz (84.1 kg)  PHYSICAL EXAM: General:  Well appearing. No resp difficulty HEENT: normal Neck: supple. no JVD. Carotids 2+ bilat; no bruits. No lymphadenopathy or thryomegaly appreciated. Cor: PMI nondisplaced. Regular rate & rhythm. No rubs, gallops or murmurs. Lungs: clear Abdomen: soft, nontender, nondistended. No hepatosplenomegaly. No bruits or masses. Good bowel sounds. Extremities: no cyanosis, clubbing, rash, edema Neuro: alert & orientedx3, cranial nerves grossly intact. moves all 4 extremities w/o difficulty. Affect pleasant  ASSESSMENT & PLAN: 1. CAD with chronic stable angina - S/P PCI of severe ostial stenosis in the left circumflex, with DES placed.  Continue asa and plavix for 12 month if tolerated continue long term per Dr York Cerise recommendation.  -- Continue Crestor 20 mg daily - Cut back imdur to 30 mg daily.  - Start cardiac rehab next month. 2. Hyperlipidemia- on statin. Continue crestor.     Follow up in 6 months with Dr Haroldine Laws   Darrick Grinder, NP 01/26/17

## 2017-01-26 NOTE — Patient Instructions (Signed)
DECREASE Imdur to 30 mg once daily. Can half your current 60 mg tablets you have at home (Take 1/2 tab once daily). New Rx has been sent to your pharmacy for 30 mg tablets (Take 1 tab once daily).  Follow up with [name] in 6 months. Take all medication as prescribed the day of your appointment. Bring all medications with you to your appointment.  Do the following things EVERYDAY: 1) Weigh yourself in the morning before breakfast. Write it down and keep it in a log. 2) Take your medicines as prescribed 3) Eat low salt foods-Limit salt (sodium) to 2000 mg per day.  4) Stay as active as you can everyday 5) Limit all fluids for the day to less than 2 liters

## 2017-02-09 ENCOUNTER — Ambulatory Visit (INDEPENDENT_AMBULATORY_CARE_PROVIDER_SITE_OTHER): Payer: Medicare Other | Admitting: Diagnostic Neuroimaging

## 2017-02-09 ENCOUNTER — Encounter (INDEPENDENT_AMBULATORY_CARE_PROVIDER_SITE_OTHER): Payer: Self-pay | Admitting: Diagnostic Neuroimaging

## 2017-02-09 ENCOUNTER — Encounter (HOSPITAL_COMMUNITY): Payer: Medicare Other

## 2017-02-09 DIAGNOSIS — Z0289 Encounter for other administrative examinations: Secondary | ICD-10-CM

## 2017-02-09 DIAGNOSIS — M21371 Foot drop, right foot: Secondary | ICD-10-CM

## 2017-02-09 DIAGNOSIS — R200 Anesthesia of skin: Secondary | ICD-10-CM

## 2017-02-13 ENCOUNTER — Telehealth (HOSPITAL_COMMUNITY): Payer: Self-pay | Admitting: *Deleted

## 2017-02-13 ENCOUNTER — Emergency Department (HOSPITAL_COMMUNITY): Payer: Medicare Other

## 2017-02-13 ENCOUNTER — Emergency Department (HOSPITAL_COMMUNITY)
Admission: EM | Admit: 2017-02-13 | Discharge: 2017-02-13 | Disposition: A | Discharge status: Home / Self Care | Payer: Medicare Other | Attending: Emergency Medicine | Admitting: Emergency Medicine

## 2017-02-13 ENCOUNTER — Encounter (HOSPITAL_COMMUNITY): Payer: Self-pay | Admitting: Emergency Medicine

## 2017-02-13 DIAGNOSIS — R079 Chest pain, unspecified: Secondary | ICD-10-CM | POA: Diagnosis present

## 2017-02-13 DIAGNOSIS — I25119 Atherosclerotic heart disease of native coronary artery with unspecified angina pectoris: Secondary | ICD-10-CM | POA: Insufficient documentation

## 2017-02-13 DIAGNOSIS — Z79899 Other long term (current) drug therapy: Secondary | ICD-10-CM | POA: Insufficient documentation

## 2017-02-13 DIAGNOSIS — Z7984 Long term (current) use of oral hypoglycemic drugs: Secondary | ICD-10-CM | POA: Diagnosis not present

## 2017-02-13 DIAGNOSIS — E039 Hypothyroidism, unspecified: Secondary | ICD-10-CM | POA: Diagnosis not present

## 2017-02-13 DIAGNOSIS — Z955 Presence of coronary angioplasty implant and graft: Secondary | ICD-10-CM | POA: Diagnosis not present

## 2017-02-13 DIAGNOSIS — Z7982 Long term (current) use of aspirin: Secondary | ICD-10-CM | POA: Insufficient documentation

## 2017-02-13 DIAGNOSIS — E119 Type 2 diabetes mellitus without complications: Secondary | ICD-10-CM | POA: Diagnosis not present

## 2017-02-13 LAB — BASIC METABOLIC PANEL
ANION GAP: 9 (ref 5–15)
BUN: 24 mg/dL — ABNORMAL HIGH (ref 6–20)
CALCIUM: 9.2 mg/dL (ref 8.9–10.3)
CO2: 23 mmol/L (ref 22–32)
Chloride: 106 mmol/L (ref 101–111)
Creatinine, Ser: 1.02 mg/dL (ref 0.61–1.24)
Glucose, Bld: 238 mg/dL — ABNORMAL HIGH (ref 65–99)
POTASSIUM: 3.9 mmol/L (ref 3.5–5.1)
SODIUM: 138 mmol/L (ref 135–145)

## 2017-02-13 LAB — CBC
HEMATOCRIT: 41.2 % (ref 39.0–52.0)
HEMOGLOBIN: 14.2 g/dL (ref 13.0–17.0)
MCH: 32.2 pg (ref 26.0–34.0)
MCHC: 34.5 g/dL (ref 30.0–36.0)
MCV: 93.4 fL (ref 78.0–100.0)
Platelets: 175 10*3/uL (ref 150–400)
RBC: 4.41 MIL/uL (ref 4.22–5.81)
RDW: 14.5 % (ref 11.5–15.5)
WBC: 10.1 10*3/uL (ref 4.0–10.5)

## 2017-02-13 LAB — TROPONIN I

## 2017-02-13 NOTE — Telephone Encounter (Signed)
Patient called complaining of heavy pressure over his heart with ongoing headache for a couple days now.  Patient recently had stent placed.   I advised patient to go to the emergency room ASAP that these symptoms could be very serious.  Patient verbalized he would go to the ER now.

## 2017-02-13 NOTE — Procedures (Signed)
[facility]  NCS (NERVE CONDUCTION STUDY) WITH EMG (ELECTROMYOGRAPHY) REPORT   STUDY DATE: 02/09/17 PATIENT NAME: Caleb Butler DOB: 1945/12/29 MRN: [id number]  ORDERING CLINICIAN: Andrey Spearman, MD   TECHNOLOGIST: Oneita Jolly ELECTROMYOGRAPHER: Earlean Polka. Penumalli, MD  CLINICAL INFORMATION: 71 year old male with right leg numbness and weakness. Evaluate for right foot drop.  FINDINGS: NERVE CONDUCTION STUDY: Right peroneal motor response has normal distal latency, decreased amplitude throughout, significantly slow conduction velocity was diminished at the popliteal fossa, normal conduction velocity with stimulation at the fibular head, and normal conduction velocity with stimulation at 27 cm and 30 cm proximal to the ankle and below the fibular head.  Left peroneal motor response is normal. Right tibial motor response is normal.  Right sural sensory response is normal.  Right superficial peroneal sensory response has prolonged peak latency and decreased appetite.   Left superficial peroneal sensory response is normal.   NEEDLE ELECTROMYOGRAPHY: Needle examination of right lower extremity notable for positive sharp waves and fibrillation potentials in the right tibialis anterior at rest with decreased odor unit recruitment on exertion. Right peroneus longus also has decreased motor unit recruitment.   Other muscles tested including right gastrocnemius, right tibialis posterior, right vastus medialis, right biceps femoris short head, right gluteus medius, right lumbar paraspinal muscles are normal.    IMPRESSION:   Abnormal study demonstrating: - Right common peroneal neuropathy, with partial conduction block with stimulation at the popliteal fossa. - No definite evidence of underlying widespread polyneuropathy or lumbar radiculopathy.     INTERPRETING PHYSICIAN:  Penni Bombard, MD Certified in Neurology, Neurophysiology and  Neuroimaging  [facility] [address], [place], [place] [address] [phone]    [facility]    Nerve / Sites Muscle Latency Ref. Amplitude Ref. Rel Amp Segments Distance Velocity Ref. Area    ms ms mV mV %  cm m/s m/s mVms  R Peroneal - EDB     Ankle EDB 5.1 ?6.5 1.6 ?2.0 100 Ankle - EDB 9   8.0     Fib head EDB 12.9  0.3  20.4 Fib head - Ankle 32 41 ?44 0.9     Pop fossa EDB 20.4  0.3  84.9 Pop fossa - Fib head 10 13 ?44 0.7     30cm prox from ankle EDB 12.2  1.5  537 30cm prox from ankle - Ankle 30 42  8.2               L Peroneal - EDB     Ankle EDB 4.6 ?6.5 4.5 ?2.0 100 Ankle - EDB 9   18.7     Fib head EDB 11.7  3.9  86.8 Fib head - Ankle 32 45 ?44 15.8     Pop fossa EDB 14.2  3.9  99.9 Pop fossa - Fib head 10 40 ?44 16.0               R Tibial - AH     Ankle AH 4.1 ?5.8 12.6 ?4.0 100 Ankle - AH 9   32.0     Pop fossa AH 13.4  8.4  66.5 Pop fossa - Ankle 38 41 ?41 28.3           SNC    Nerve / Sites Rec. Site Peak Lat Ref.  Amp Ref. Segments Distance    ms ms V V  cm  R Sural - Ankle (Calf)     Calf Ankle 3.1 ?4.4 10 ?  6 Calf - Ankle 14  R Superficial peroneal - Ankle     Lat leg Ankle 4.5 ?4.4 4 ?6 Lat leg - Ankle 14  L Superficial peroneal - Ankle     Lat leg Ankle 4.2 ?4.4 5 ?6 Lat leg - Ankle 14           F  Wave    Nerve F Lat Ref.   ms ms  R Tibial - AH 55.2 ?56.0       EMG full       EMG Summary Table    Spontaneous MUAP Recruitment  Muscle IA Fib PSW Fasc Other Amp Dur. Poly Pattern  R. Tibialis anterior Normal 1+ 1+ None _______ Increased Normal 1+ Reduced  R. Gastrocnemius (Medial head) Normal None None None _______ Normal Normal Normal Normal  R. Tibialis posterior Normal None None None _______ Normal Normal Normal Normal  R. Peroneus longus Normal None None None _______ Increased Normal Normal Discrete  R. Vastus medialis Normal None None None _______ Normal Normal Normal Normal  R. Biceps femoris (short head) Normal None  None None _______ Normal Normal Normal Normal  R. Gluteus medius Normal None None None _______ Normal Normal Normal Normal  R. Lumbar paraspinals Normal None None None _______ Normal Normal Normal Normal

## 2017-02-13 NOTE — ED Triage Notes (Signed)
Patient complains of left chest pain. States had stent placement in August of 2018. States pressure and pain in chest.

## 2017-02-13 NOTE — Addendum Note (Signed)
Addended by: Andrey Spearman R on: 02/13/2017 05:45 PM   Modules accepted: Orders

## 2017-02-13 NOTE — Discharge Instructions (Signed)
Tests showed no life-threatening condition. Follow-up your cardiologist. Return if worse.

## 2017-02-13 NOTE — ED Provider Notes (Addendum)
[place] DEPT Provider Note   CSN: [id number] Arrival date & time: 02/13/17  1309     History   Chief Complaint Chief Complaint  Patient presents with  . Chest Pain    HPI Caleb Butler is a 71 y.o. male.  Complains of pulsatile left-sided chest pain intermittently for 2 weeks. Symptoms last 1-2 seconds and he has had 3-4 episodes per day. No associated dyspnea, diaphoresis, crushing substernal chest pain. He did have a stent placed in August 2018. Severity of symptoms is mild.  Nothing makes symptoms better or worse.      Past Medical History:  Diagnosis Date  . Anxiety   . Arthritis    "my back is loaded w/it" (01/06/2017)  . CAD (coronary artery disease)    80% ostial lesion in L circumflex (2017)  . Chronic lower back pain   . GERD (gastroesophageal reflux disease)   . Hiatal hernia   . High cholesterol   . Hypothyroidism   . IBS (irritable bowel syndrome)   . Numbness and tingling of right lower extremity    "since 11/30/2016" (01/06/2017)  . PMR (polymyalgia rheumatica) (HCC)   . Type 2 diabetes mellitus [facility])     Patient Active Problem List   Diagnosis Date Noted  . Coronary artery disease with exertional angina ([place]) 01/06/2017  . Coronary artery disease involving native coronary artery of native heart with angina pectoris ([place])   . Anginal equivalent ([place]) 07/11/2016  . Exertional dyspnea 03/09/2016  . Abnormal PFT 03/09/2016  . OSTEOARTHRITIS, SHOULDER 08/03/2009  . IMPINGEMENT SYNDROME 07/13/2009    Past Surgical History:  Procedure Laterality Date  . CARDIAC CATHETERIZATION  06/2016  . CORONARY ANGIOPLASTY WITH STENT PLACEMENT  01/06/2017  . CORONARY STENT INTERVENTION N/A 01/06/2017   Procedure: CORONARY STENT INTERVENTION;  Surgeon: Sherren Mocha, MD;  Location: [place] [facility];  Service: Cardiovascular;  Laterality: N/A;  . FINGER SURGERY Left 1984   ring finger reattached.   . FOREARM FRACTURE SURGERY Right 2004   "has 3  rods and 22 screws in it"  . INGUINAL HERNIA REPAIR Left   . RIGHT/LEFT HEART CATH AND CORONARY ANGIOGRAPHY N/A 07/07/2016   Procedure: Right/Left Heart Cath and Coronary Angiography;  Surgeon: Jolaine Artist, MD;  Location: [place] [facility];  Service: Cardiovascular;  Laterality: N/A;       Home Medications    Prior to Admission medications   Medication Sig Start Date End Date Taking? Authorizing Provider  acetaminophen (TYLENOL) 325 MG tablet Take 650 mg by mouth every 4 (four) hours as needed for mild pain.    Yes [provider]  aspirin 81 MG EC tablet Take 81 mg by mouth at bedtime.    Yes [provider]  clopidogrel (PLAVIX) 75 MG tablet Take 1 tablet (75 mg total) by mouth daily. 01/07/17  Yes Strader, Hyde, PA-C  glucose blood test strip Check BS BID Dx: E11.9 09/23/16  Yes Pickard, Cammie Mcgee, MD  isosorbide mononitrate (IMDUR) 30 MG 24 hr tablet Take 1 tablet (30 mg total) by mouth daily. 01/26/17  Yes Clegg, Amy D, NP  Lancets (ACCU-CHEK MULTICLIX) lancets Check BS BID Dx: E11.9 01/03/17  Yes Susy Frizzle, MD  levothyroxine (SYNTHROID, LEVOTHROID) 100 MCG tablet Take 1 tablet (100 mcg total) by mouth daily before breakfast. 10/14/16  Yes Susy Frizzle, MD  metFORMIN (GLUCOPHAGE) 1000 MG tablet Take 1 tablet (1,000 mg total) by mouth 2 (two) times daily with a meal.  10/25/16  Yes Susy Frizzle, MD  Multiple Vitamin (MULTIVITAMIN WITH MINERALS) TABS tablet Take 1 tablet by mouth daily.   Yes [provider]  predniSONE (DELTASONE) 5 MG tablet Take 5 mg by mouth daily with breakfast. Take with #4 of the 1 mg tablets daily   Yes [provider]  rosuvastatin (CRESTOR) 20 MG tablet Take 20 mg by mouth at bedtime.    Yes [provider]  sitaGLIPtin (JANUVIA) 100 MG tablet Take 1 tablet (100 mg total) by mouth daily. 01/23/17  Yes Susy Frizzle, MD  vitamin B-12 (CYANOCOBALAMIN) 1000 MCG tablet Take 1,000 mcg by mouth  daily.   Yes [provider]    Family History Family History  Problem Relation Age of Onset  . Diabetes Mellitus II Father   . Heart failure Father   . Kidney failure Father   . Heart attack Mother     Social History Social History  Substance Use Topics  . Smoking status: Never Smoker  . Smokeless tobacco: Never Used  . Alcohol use 1.2 oz/week    1 Glasses of wine, 1 Cans of beer per week     Comment: 1 beer/week     Allergies   Bee venom; Ceclor [cefaclor]; and Shellfish allergy   Review of Systems Review of Systems  All other systems reviewed and are negative.    Physical Exam Updated Vital Signs BP 108/73   Pulse 70   Temp 97.8 F (36.6 C) (Oral)   Resp (!) 24   Ht 5' 9.5" (1.765 m)   Wt 83.9 kg (185 lb)   SpO2 97%   BMI 26.93 kg/m   Physical Exam  Constitutional: He is oriented to person, place, and time. He appears well-developed and well-nourished.  HENT:  Head: Normocephalic and atraumatic.  Eyes: Conjunctivae are normal.  Neck: Neck supple.  Cardiovascular: Normal rate and regular rhythm.   Pulmonary/Chest: Effort normal and breath sounds normal.  Abdominal: Soft. Bowel sounds are normal.  Musculoskeletal: Normal range of motion.  Neurological: He is alert and oriented to person, place, and time.  Skin: Skin is warm and dry.  Psychiatric: He has a normal mood and affect. His behavior is normal.  Nursing note and vitals reviewed.    ED Treatments / Results  Labs (all labs ordered are listed, but only abnormal results are displayed) Labs Reviewed  BASIC METABOLIC PANEL - Abnormal; Notable for the following:       Result Value   Glucose, Bld 238 (*)    BUN 24 (*)    All other components within normal limits  CBC  TROPONIN I    EKG  EKG Interpretation  Date/Time:  Monday February 13 2017 13:16:29 EDT Ventricular Rate:  83 PR Interval:  152 QRS Duration: 96 QT Interval:  366 QTC Calculation: 430 R Axis:   51 Text  Interpretation:  Normal sinus rhythm Normal ECG Confirmed by Nat Christen [phone]) on 02/13/2017 4:05:59 PM       Radiology Dg Chest 2 View  Result Date: 02/13/2017 CLINICAL DATA:  Chest pressure. EXAM: CHEST  2 VIEW COMPARISON:  CT chest dated March 23, 2016. FINDINGS: The cardiomediastinal silhouette is normal in size. Tortuous thoracic aorta. Atherosclerotic calcification of the aortic arch. Normal pulmonary vascularity. No focal consolidation, pleural effusion, or pneumothorax. No acute osseous abnormality. IMPRESSION: No active cardiopulmonary disease. Electronically Signed   By: Titus Dubin M.D.   On: 02/13/2017 13:44    Procedures Procedures (including critical  care time)  Medications Ordered in ED Medications - No data to display   Initial Impression / Assessment and Plan / ED Course  I have reviewed the triage vital signs and the nursing notes.  Pertinent labs & imaging results that were available during my care of the patient were reviewed by me and considered in my medical decision making (see chart for details).     Fleeting nature of patient's pain does not suggest acute coronary syndrome. Screening tests including EKG, chest x-ray, troponin all negative. Discussed with patient and his wife. He will follow-up with his cardiologist.  Final Clinical Impressions(s) / ED Diagnoses   Final diagnoses:  Chest pain, unspecified type    New Prescriptions Discharge Medication List as of 02/13/2017  3:32 PM       Nat Christen, MD 02/13/17 1607    Nat Christen, MD 02/13/17 [phone]

## 2017-02-15 ENCOUNTER — Telehealth: Payer: Self-pay | Admitting: Diagnostic Neuroimaging

## 2017-02-15 NOTE — Telephone Encounter (Signed)
Patient  Is scheduled to have his MR Knee at [facility] on Wednesday 02/22/17. I left a detail voicemail for the patient to be aware of this. His arrival time is 11:45 AM and I did leave the phone number of [phone] if he needs to reschedule for any reason.

## 2017-02-20 ENCOUNTER — Telehealth (HOSPITAL_COMMUNITY): Payer: Self-pay | Admitting: *Deleted

## 2017-02-20 NOTE — Telephone Encounter (Signed)
I called patient back and he will hold medication for now and will call us back on Friday and let us know how he is feeling.  He is aware that he should go to the ER if his symptoms worsen.

## 2017-02-20 NOTE — Telephone Encounter (Signed)
Advanced Heart Failure Triage Encounter  Patient Name: Caleb Butler  Date of Call: 02/20/17  Problem: constant headache/pressure  Patient called complaining of constant headache- described it as head pressure.  This started couple days after his stent that was placed on 01/06/17.  Patient recently went to ED for chest pain.  He is on Imdur and his dosage was decreased on 8/30.    Plan:  Will send to Jettie Booze, NP to review.   Darron Doom, RN

## 2017-02-20 NOTE — Telephone Encounter (Signed)
Please have him stop Imdur, and have him call us in a few days to let us know if it is better. If his headache becomes worse or unbearable pain, have him go to the ED.

## 2017-02-22 ENCOUNTER — Telehealth: Payer: Self-pay | Admitting: Diagnostic Neuroimaging

## 2017-02-22 ENCOUNTER — Ambulatory Visit (HOSPITAL_COMMUNITY): Admission: RE | Admit: 2017-02-22 | Payer: Medicare Other | Source: Ambulatory Visit

## 2017-02-22 NOTE — Telephone Encounter (Signed)
Patient is scheduled to have his MR Knee done today at [facility] at noon, [place] with [facility] reached out to me and asked me if contrast needs to be used. She stated they haven't done many of those images done before and just wanted to make sure.

## 2017-02-22 NOTE — Telephone Encounter (Signed)
Noted  

## 2017-02-22 NOTE — Telephone Encounter (Signed)
I called Caleb Butler at [facility] and informed her that Dr. Leta Baptist was ruling out nerve compression causes (mass, cyst); which ever study they recommend. She stated she was going to talk to their Radiologist and see what they thought. If they needed to change the order to just wo contrast they stated they would change the order.

## 2017-03-09 ENCOUNTER — Encounter (HOSPITAL_COMMUNITY)
Admission: RE | Admit: 2017-03-09 | Discharge: 2017-03-09 | Disposition: A | Discharge status: Home / Self Care | Payer: Medicare Other | Source: Ambulatory Visit | Attending: Internal Medicine | Admitting: Internal Medicine

## 2017-03-09 ENCOUNTER — Encounter (HOSPITAL_COMMUNITY): Payer: Self-pay

## 2017-03-09 VITALS — BP 102/62 | HR 81 | Ht 69.0 in | Wt 184.1 lb

## 2017-03-09 DIAGNOSIS — F419 Anxiety disorder, unspecified: Secondary | ICD-10-CM | POA: Insufficient documentation

## 2017-03-09 DIAGNOSIS — E039 Hypothyroidism, unspecified: Secondary | ICD-10-CM | POA: Insufficient documentation

## 2017-03-09 DIAGNOSIS — Z7982 Long term (current) use of aspirin: Secondary | ICD-10-CM | POA: Insufficient documentation

## 2017-03-09 DIAGNOSIS — Z7902 Long term (current) use of antithrombotics/antiplatelets: Secondary | ICD-10-CM | POA: Diagnosis not present

## 2017-03-09 DIAGNOSIS — K219 Gastro-esophageal reflux disease without esophagitis: Secondary | ICD-10-CM | POA: Diagnosis not present

## 2017-03-09 DIAGNOSIS — E7800 Pure hypercholesterolemia, unspecified: Secondary | ICD-10-CM | POA: Insufficient documentation

## 2017-03-09 DIAGNOSIS — Z79899 Other long term (current) drug therapy: Secondary | ICD-10-CM | POA: Diagnosis not present

## 2017-03-09 DIAGNOSIS — E119 Type 2 diabetes mellitus without complications: Secondary | ICD-10-CM | POA: Diagnosis not present

## 2017-03-09 DIAGNOSIS — K589 Irritable bowel syndrome without diarrhea: Secondary | ICD-10-CM | POA: Diagnosis not present

## 2017-03-09 DIAGNOSIS — Z955 Presence of coronary angioplasty implant and graft: Secondary | ICD-10-CM | POA: Insufficient documentation

## 2017-03-09 DIAGNOSIS — M353 Polymyalgia rheumatica: Secondary | ICD-10-CM | POA: Insufficient documentation

## 2017-03-09 DIAGNOSIS — I2510 Atherosclerotic heart disease of native coronary artery without angina pectoris: Secondary | ICD-10-CM | POA: Diagnosis not present

## 2017-03-09 DIAGNOSIS — Z7952 Long term (current) use of systemic steroids: Secondary | ICD-10-CM | POA: Diagnosis not present

## 2017-03-09 DIAGNOSIS — Z7984 Long term (current) use of oral hypoglycemic drugs: Secondary | ICD-10-CM | POA: Diagnosis not present

## 2017-03-09 NOTE — Progress Notes (Signed)
Daily Session Note  Patient Details  Name: Caleb Butler MRN: [id number] Date of Birth: 01/14/1946 Referring Provider:     CARDIAC REHAB PHASE II ORIENTATION from 03/09/2017 in [place]  Referring Provider  [name]      Encounter Date: 03/09/2017  Check In:     Session Check In - 03/09/17 1230      Check-In   Location [facility]   Staff Present Jernee Murtaugh Angelina Pih, MS, EP, [facility], Exercise Physiologist;Gregory Luther Parody, BS, EP, Exercise Physiologist;Debra Wynetta Emery, RN, BSN   Supervising physician immediately available to respond to emergencies See telemetry face sheet for immediately available MD   Medication changes reported     No   Fall or balance concerns reported    No   Tobacco Cessation --  Never Smoked   Warm-up and Cool-down Performed as group-led instruction   Resistance Training Performed Yes   VAD Patient? No     Pain Assessment   Currently in Pain? No/denies   Pain Score 0-No pain   Multiple Pain Sites No      Capillary Blood Glucose: No results found for this or any previous visit (from the past 24 hour(s)).    History  Smoking Status  . Never Smoker  Smokeless Tobacco  . Never Used    Goals Met:  Independence with exercise equipment Exercise tolerated well No report of cardiac concerns or symptoms Strength training completed today  Goals Unmet:  Not Applicable  Comments: Check out 1500   [name] is Medical Director for [place] and Pulmonary Rehab.

## 2017-03-09 NOTE — Progress Notes (Signed)
Cardiac/Pulmonary Rehab Medication Review by a Pharmacist  Does the patient  feel that his/her medications are working for him/her?  yes  Has the patient been experiencing any side effects to the medications prescribed?  no  Does the patient measure his/her own blood pressure or blood glucose at home?  yes   Does the patient have any problems obtaining medications due to transportation or finances?   no  Understanding of regimen: excellent Understanding of indications: excellent Potential of compliance: excellent  Questions asked to Determine Patient Understanding of Medication Regimen:  1. What is the name of the medication?  2. What is the medication used for?  3. When should it be taken?  4. How much should be taken?  5. How will you take it?  6. What side effects should you report?  Understanding Defined as: Excellent: All questions above are correct Good: Questions 1-4 are correct Fair: Questions 1-2 are correct  Poor: 1 or none of the above questions are correct   Pharmacist comments: Caleb Butler is compliant with medications and tolerating. He had some dizziness a few weeks ago but discovered it was related to an elevated Blood sugar. He monitors his BS twice daily and his Hgb A1C is around 6.4. Very knowledgeable about medications. No problems currently with his regimen.  Thanks for the opportunity to participate in the care of this patient,  Caleb Butler, BS Vena [place], [place] Pharmacist Pager [phone] 03/09/2017 2:12 PM

## 2017-03-09 NOTE — Progress Notes (Signed)
Cardiac Individual Treatment Plan  Patient Details  Name: Caleb Butler MRN: [id number] Date of Birth: 1945/07/19 Referring Provider:     CARDIAC REHAB PHASE II ORIENTATION from 03/09/2017 in [place]  Referring Provider  [name]      Initial Encounter Date:    CARDIAC REHAB PHASE II ORIENTATION from 03/09/2017 in [place]  Date  03/09/17  Referring Provider  [name]      Visit Diagnosis: Status post coronary artery stent placement  Patient's Home Medications on Admission:  Current Outpatient Prescriptions:  .  acetaminophen (TYLENOL) 325 MG tablet, Take 650 mg by mouth every 4 (four) hours as needed for mild pain. , Disp: , Rfl:  .  aspirin 81 MG EC tablet, Take 81 mg by mouth at bedtime. , Disp: , Rfl:  .  clopidogrel (PLAVIX) 75 MG tablet, Take 1 tablet (75 mg total) by mouth daily., Disp: 90 tablet, Rfl: 3 .  glucose blood test strip, Check BS BID Dx: E11.9, Disp: 100 each, Rfl: 5 .  isosorbide mononitrate (IMDUR) 30 MG 24 hr tablet, Take 1 tablet (30 mg total) by mouth daily. (Patient not taking: Reported on 03/09/2017), Disp: 90 tablet, Rfl: 3 .  Lancets (ACCU-CHEK MULTICLIX) lancets, Check BS BID Dx: E11.9, Disp: 100 each, Rfl: 12 .  levothyroxine (SYNTHROID, LEVOTHROID) 100 MCG tablet, Take 1 tablet (100 mcg total) by mouth daily before breakfast., Disp: 90 tablet, Rfl: 3 .  metFORMIN (GLUCOPHAGE) 1000 MG tablet, Take 1 tablet (1,000 mg total) by mouth 2 (two) times daily with a meal., Disp: 180 tablet, Rfl: 3 .  Multiple Vitamin (MULTIVITAMIN WITH MINERALS) TABS tablet, Take 1 tablet by mouth daily., Disp: , Rfl:  .  predniSONE (DELTASONE) 5 MG tablet, Take 5 mg by mouth daily with breakfast. Take with #2 of the 1 mg tablets daily, Disp: , Rfl:  .  rosuvastatin (CRESTOR) 20 MG tablet, Take 20 mg by mouth at bedtime. , Disp: , Rfl:  .  sitaGLIPtin (JANUVIA) 100 MG tablet, Take 1 tablet (100 mg total) by  mouth daily., Disp: 90 tablet, Rfl: 1 .  vitamin B-12 (CYANOCOBALAMIN) 1000 MCG tablet, Take 1,000 mcg by mouth daily., Disp: , Rfl:   Past Medical History: Past Medical History:  Diagnosis Date  . Anxiety   . Arthritis    "my back is loaded w/it" (01/06/2017)  . CAD (coronary artery disease)    80% ostial lesion in L circumflex (2017)  . Chronic lower back pain   . GERD (gastroesophageal reflux disease)   . Hiatal hernia   . High cholesterol   . Hypothyroidism   . IBS (irritable bowel syndrome)   . Numbness and tingling of right lower extremity    "since 11/30/2016" (01/06/2017)  . PMR (polymyalgia rheumatica) (HCC)   . Type 2 diabetes mellitus (HCC)     Tobacco Use: History  Smoking Status  . Never Smoker  Smokeless Tobacco  . Never Used    Labs: Recent Review Flowsheet Data    Labs for ITP Cardiac and Pulmonary Rehab Latest Ref Rng & Units 11/30/2015 07/07/2016 07/07/2016 07/07/2016 08/18/2016   Cholestrol 125 - 200 mg/dL 140 - - - -   LDLCALC <130 mg/dL 57 - - - -   HDL >=40 mg/dL 41 - - - -   Trlycerides <150 mg/dL 210(H) - - - -   Hemoglobin A1c <5.7 % 7.3(H) - - - 6.4(H)   PHART 7.350 - 7.450 - 7.382 - - -  PCO2ART 32.0 - 48.0 mmHg - 40.7 - - -   HCO3 20.0 - 28.0 mmol/L - 24.2 21.2 24.1 -   TCO2 0 - 100 mmol/L - 25 22 25  -   ACIDBASEDEF 0.0 - 2.0 mmol/L - 1.0 5.0(H) 2.0 -   O2SAT % - 95.0 68.0 69.0 -      Capillary Blood Glucose: Lab Results  Component Value Date   GLUCAP 167 (H) 01/07/2017   GLUCAP 178 (H) 01/06/2017   GLUCAP 249 (H) 01/06/2017   GLUCAP 250 (H) 01/06/2017   GLUCAP 197 (H) 01/06/2017     Exercise Target Goals: Date: 03/09/17  Exercise Program Goal: Individual exercise prescription set with THRR, safety & activity barriers. Participant demonstrates ability to understand and report RPE using BORG scale, to self-measure pulse accurately, and to acknowledge the importance of the exercise prescription.  Exercise Prescription Goal: Starting with  aerobic activity 30 plus minutes a day, 3 days per week for initial exercise prescription. Provide home exercise prescription and guidelines that participant acknowledges understanding prior to discharge.  Activity Barriers & Risk Stratification:     Activity Barriers & Cardiac Risk Stratification - 03/09/17 1401      Activity Barriers & Cardiac Risk Stratification   Activity Barriers Joint Problems;Shortness of Breath   Cardiac Risk Stratification Moderate  Moderate to High      6 Minute Walk:     6 Minute Walk    Row Name 03/09/17 1400         6 Minute Walk   Phase Initial     Distance 1250 feet     Distance % Change 0 %     Distance Feet Change 0 ft     Walk Time 6 minutes     # of Rest Breaks 0     MPH 2.36     METS 2.81     RPE 9     Perceived Dyspnea  9     VO2 Peak 9.28     Symptoms No     Resting HR 81 bpm     Resting BP 102/62     Resting Oxygen Saturation  96 %     Exercise Oxygen Saturation  during 6 min walk 96 %     Max Ex. HR 88 bpm     Max Ex. BP 116/64     2 Minute Post BP 104/62        Oxygen Initial Assessment:   Oxygen Re-Evaluation:   Oxygen Discharge (Final Oxygen Re-Evaluation):   Initial Exercise Prescription:     Initial Exercise Prescription - 03/09/17 1400      Date of Initial Exercise RX and Referring Provider   Date 03/09/17   Referring Provider [name]     Treadmill   MPH 2   Grade 0   Minutes 15   METs 2.53     Recumbant Elliptical   Level 1   RPM 79   Watts 46   Minutes 1.7   METs 3.4     Prescription Details   Frequency (times per week) 3   Duration Progress to 30 minutes of continuous aerobic without signs/symptoms of physical distress     Intensity   THRR 40-80% of Max Heartrate [phone]   Ratings of Perceived Exertion 11-13   Perceived Dyspnea 0-4     Progression   Progression Continue progressive overload as per policy without signs/symptoms or physical distress.     Cytogeneticist  Prescription Yes   Weight 1   Reps 10-15      Perform Capillary Blood Glucose checks as needed.  Exercise Prescription Changes:   Exercise Comments:   Exercise Goals and Review:      Exercise Goals    Row Name 03/09/17 1404             Exercise Goals   Increase Physical Activity Yes       Intervention Provide advice, education, support and counseling about physical activity/exercise needs.;Develop an individualized exercise prescription for aerobic and resistive training based on initial evaluation findings, risk stratification, comorbidities and participant's personal goals.       Expected Outcomes Achievement of increased cardiorespiratory fitness and enhanced flexibility, muscular endurance and strength shown through measurements of functional capacity and personal statement of participant.       Increase Strength and Stamina Yes       Intervention Provide advice, education, support and counseling about physical activity/exercise needs.;Develop an individualized exercise prescription for aerobic and resistive training based on initial evaluation findings, risk stratification, comorbidities and participant's personal goals.       Expected Outcomes Achievement of increased cardiorespiratory fitness and enhanced flexibility, muscular endurance and strength shown through measurements of functional capacity and personal statement of participant.       Able to understand and use rate of perceived exertion (RPE) scale Yes       Intervention Provide education and explanation on how to use RPE scale       Expected Outcomes Short Term: Able to use RPE daily in rehab to express subjective intensity level;Long Term:  Able to use RPE to guide intensity level when exercising independently       Able to understand and use Dyspnea scale Yes       Intervention Provide education and explanation on how to use Dyspnea scale       Expected Outcomes Short Term: Able to use  Dyspnea scale daily in rehab to express subjective sense of shortness of breath during exertion;Long Term: Able to use Dyspnea scale to guide intensity level when exercising independently       Knowledge and understanding of Target Heart Rate Range (THRR) Yes       Intervention Provide education and explanation of THRR including how the numbers were predicted and where they are located for reference       Expected Outcomes Short Term: Able to state/look up THRR;Short Term: Able to use daily as guideline for intensity in rehab;Long Term: Able to use THRR to govern intensity when exercising independently       Able to check pulse independently Yes       Intervention Provide education and demonstration on how to check pulse in carotid and radial arteries.;Review the importance of being able to check your own pulse for safety during independent exercise       Expected Outcomes Short Term: Able to explain why pulse checking is important during independent exercise;Long Term: Able to check pulse independently and accurately       Understanding of Exercise Prescription Yes       Intervention Provide education, explanation, and written materials on patient's individual exercise prescription       Expected Outcomes Short Term: Able to explain program exercise prescription;Long Term: Able to explain home exercise prescription to exercise independently          Exercise Goals Re-Evaluation :    Discharge Exercise Prescription (Final Exercise Prescription Changes):   Nutrition:  Target Goals: Understanding of nutrition guidelines, daily intake of sodium 1500mg , cholesterol 200mg , calories 30% from fat and 7% or less from saturated fats, daily to have 5 or more servings of fruits and vegetables.  Biometrics:     Pre Biometrics - 03/09/17 1404      Pre Biometrics   Height 5\' 9"  (1.753 m)   Weight 184 lb 1.6 oz (83.5 kg)   Waist Circumference 37 inches   Hip Circumference 39 inches   Waist to Hip  Ratio 0.95 %   BMI (Calculated) 27.17   Triceps Skinfold 7 mm   % Body Fat 22.8 %   Grip Strength 72.27 kg   Flexibility 10.67 in   Single Leg Stand 7 seconds       Nutrition Therapy Plan and Nutrition Goals:     Nutrition Therapy & Goals - 03/09/17 1437      Personal Nutrition Goals   Personal Goal #2 Continue eating a heart healthy diet.    Additional Goals? No      Nutrition Discharge: Rate Your Plate Scores:     Nutrition Assessments - 03/09/17 1438      MEDFICTS Scores   Pre Score 41      Nutrition Goals Re-Evaluation:   Nutrition Goals Discharge (Final Nutrition Goals Re-Evaluation):   Psychosocial: Target Goals: Acknowledge presence or absence of significant depression and/or stress, maximize coping skills, provide positive support system. Participant is able to verbalize types and ability to use techniques and skills needed for reducing stress and depression.  Initial Review & Psychosocial Screening:     Initial Psych Review & Screening - 03/09/17 1444      Initial Review   Current issues with None Identified     Family Dynamics   Good Support System? Yes     Barriers   Psychosocial barriers to participate in program There are no identifiable barriers or psychosocial needs.     Screening Interventions   Interventions Encouraged to exercise      Quality of Life Scores:     Quality of Life - 03/09/17 1405      Quality of Life Scores   Health/Function Pre 23.2 %   Socioeconomic Pre 27.56 %   Psych/Spiritual Pre 23.86 %   Family Pre 25.13 %   GLOBAL Pre 24.59 %      PHQ-9: Recent Review Flowsheet Data    Depression screen [facility] 2/9 03/09/2017 09/06/2016 08/18/2016 01/18/2016   Decreased Interest 0 1 0 0   Down, Depressed, Hopeless 0 1 0 0   PHQ - 2 Score 0 2 0 0   Altered sleeping 2 3 0 -   Tired, decreased energy 2 3 0 -   Change in appetite 1 2 0 -   Feeling bad or failure about yourself  0 0 0 -   Trouble concentrating 1 1 0 -    Moving slowly or fidgety/restless 1 1 0 -   Suicidal thoughts 0 0 0 -   PHQ-9 Score 7 12 0 -   Difficult doing work/chores Not difficult at all Somewhat difficult Not difficult at all -     Interpretation of Total Score  Total Score Depression Severity:  1-4 = Minimal depression, 5-9 = Mild depression, 10-14 = Moderate depression, 15-19 = Moderately severe depression, 20-27 = Severe depression   Psychosocial Evaluation and Intervention:     Psychosocial Evaluation - 03/09/17 1445      Psychosocial Evaluation & Interventions   Interventions Encouraged to  exercise with the program and follow exercise prescription   Continue Psychosocial Services  No Follow up required  Patient is in a better place this time. QOL scores are 24. 59.      Psychosocial Re-Evaluation:   Psychosocial Discharge (Final Psychosocial Re-Evaluation):   Vocational Rehabilitation: Provide vocational rehab assistance to qualifying candidates.   Vocational Rehab Evaluation & Intervention:     Vocational Rehab - 03/09/17 1415      Initial Vocational Rehab Evaluation & Intervention   Assessment shows need for Vocational Rehabilitation No      Education: Education Goals: Education classes will be provided on a weekly basis, covering required topics. Participant will state understanding/return demonstration of topics presented.  Learning Barriers/Preferences:     Learning Barriers/Preferences - 03/09/17 1414      Learning Barriers/Preferences   Learning Barriers None   Learning Preferences Skilled Demonstration;Verbal Instruction;Individual Instruction;Group Instruction      Education Topics: Hypertension, Hypertension Reduction -Define heart disease and high blood pressure. Discus how high blood pressure affects the body and ways to reduce high blood pressure.   Exercise and Your Heart -Discuss why it is important to exercise, the FITT principles of exercise, normal and abnormal responses to  exercise, and how to exercise safely.   Angina -Discuss definition of angina, causes of angina, treatment of angina, and how to decrease risk of having angina.   Cardiac Medications -Review what the following cardiac medications are used for, how they affect the body, and side effects that may occur when taking the medications.  Medications include Aspirin, Beta blockers, calcium channel blockers, ACE Inhibitors, angiotensin receptor blockers, diuretics, digoxin, and antihyperlipidemics.   Congestive Heart Failure -Discuss the definition of CHF, how to live with CHF, the signs and symptoms of CHF, and how keep track of weight and sodium intake.   Heart Disease and Intimacy -Discus the effect sexual activity has on the heart, how changes occur during intimacy as we age, and safety during sexual activity.   Smoking Cessation / COPD -Discuss different methods to quit smoking, the health benefits of quitting smoking, and the definition of COPD.   Nutrition I: Fats -Discuss the types of cholesterol, what cholesterol does to the heart, and how cholesterol levels can be controlled.   Nutrition II: Labels -Discuss the different components of food labels and how to read food label   PULMONARY REHAB OTHER RESPIRATORY from 12/29/2016 in [place]  Date  11/24/16  Educator  [place]  Instruction Review Code  2- meets goals/outcomes      Heart Parts and Heart Disease -Discuss the anatomy of the heart, the pathway of blood circulation through the heart, and these are affected by heart disease.   Stress I: Signs and Symptoms -Discuss the causes of stress, how stress may lead to anxiety and depression, and ways to limit stress.   PULMONARY REHAB OTHER RESPIRATORY from 12/29/2016 in [place]  Date  12/08/16  Educator  [place]  Instruction Review Code  2- meets goals/outcomes      Stress II: Relaxation -Discuss different types of relaxation techniques  to limit stress.   PULMONARY REHAB OTHER RESPIRATORY from 12/29/2016 in [place]  Date  12/15/16  Educator  [place]  Instruction Review Code  2- meets goals/outcomes      Warning Signs of Stroke / TIA -Discuss definition of a stroke, what the signs and symptoms are of a stroke, and how to identify when someone is having stroke.  Knowledge Questionnaire Score:     Knowledge Questionnaire Score - 03/09/17 1415      Knowledge Questionnaire Score   Pre Score 19/24      Core Components/Risk Factors/Patient Goals at Admission:     Personal Goals and Risk Factors at Admission - 03/09/17 1441      Core Components/Risk Factors/Patient Goals on Admission    Weight Management Yes   Admit Weight 184 lb 1.6 oz (83.5 kg)   Goal Weight: Short Term 179 lb 1.6 oz (81.2 kg)   Goal Weight: Long Term 174 lb 1.6 oz (79 kg)   Expected Outcomes Short Term: Continue to assess and modify interventions until short term weight is achieved;Long Term: Adherence to nutrition and physical activity/exercise program aimed toward attainment of established weight goal   Personal Goal Other Yes   Personal Goal Breathe better, lose 10 lbs   Intervention Attend CR 3 x week and supplement with home exercise 2 x week.   Expected Outcomes Achieve personal goals.       Core Components/Risk Factors/Patient Goals Review:    Core Components/Risk Factors/Patient Goals at Discharge (Final Review):    ITP Comments:     ITP Comments    Row Name 03/09/17 1416           ITP Comments Mr. Tetrault is coming back to the program after having stent placement. He finished our pulmonary program 6 months ago.           Comments: Patient arrived for 1st visit/orientation/education at 12:30. Patient was referred to CR by [name] due to Stent Placement (Z95.5). During orientation advised patient on arrival and appointment times what to wear, what to do before, during and after exercise. Reviewed  attendance and class policy. Talked about inclement weather and class consultation policy. Pt is scheduled to return Cardiac Rehab on 03/15/17 at 0930. Pt was advised to come to class 15 minutes before class starts. Patient was also given instructions on meeting with the dietician and attending the Family Structure classes. Discussed RPE/Dpysnea scales. Discussed initial THR and how to find their radial and/or carotid pulse. Discussed the initial exercise prescription and how this effects their progress. Pt is eager to get started. Patient participated in warm up stretches followed by light weights and resistance bands. Patient was able to complete 6 minute walk test. Patient had no complaints or S/S during walk test. Patient was measured for the equipment. Discussed equipment safety with patient. Took patient pre-anthropometric measurements. Patient finished visit at 1500.

## 2017-03-15 ENCOUNTER — Encounter (HOSPITAL_COMMUNITY)
Admission: RE | Admit: 2017-03-15 | Discharge: 2017-03-15 | Disposition: A | Discharge status: Home / Self Care | Payer: Medicare Other | Source: Ambulatory Visit | Attending: Internal Medicine | Admitting: Internal Medicine

## 2017-03-15 DIAGNOSIS — Z955 Presence of coronary angioplasty implant and graft: Secondary | ICD-10-CM | POA: Diagnosis not present

## 2017-03-15 DIAGNOSIS — R0689 Other abnormalities of breathing: Secondary | ICD-10-CM

## 2017-03-15 DIAGNOSIS — R0600 Dyspnea, unspecified: Secondary | ICD-10-CM

## 2017-03-15 NOTE — Progress Notes (Signed)
Daily Session Note  Patient Details  Name: Caleb Butler MRN: [id number] Date of Birth: August 23, 1945 Referring Provider:     CARDIAC REHAB PHASE II ORIENTATION from 03/09/2017 in [place]  Referring Provider  [name]      Encounter Date: 03/15/2017  Check In:     Session Check In - 03/15/17 0930      Check-In   Location [facility]   Staff Present Aundra Dubin, RN, BSN;Gregory Luther Parody, BS, EP, Exercise Physiologist   Supervising physician immediately available to respond to emergencies See telemetry face sheet for immediately available MD   Medication changes reported     No   Fall or balance concerns reported    No   Warm-up and Cool-down Performed as group-led instruction   Resistance Training Performed Yes   VAD Patient? No     Pain Assessment   Currently in Pain? No/denies   Pain Score 0-No pain   Multiple Pain Sites No      Capillary Blood Glucose: No results found for this or any previous visit (from the past 24 hour(s)).    History  Smoking Status  . Never Smoker  Smokeless Tobacco  . Never Used    Goals Met:  Independence with exercise equipment Exercise tolerated well No report of cardiac concerns or symptoms Strength training completed today  Goals Unmet:  Not Applicable  Comments: Check out 1030.   [name] is Medical Director for [facility] Cardiac and Pulmonary Rehab.

## 2017-03-17 ENCOUNTER — Encounter (HOSPITAL_COMMUNITY)
Admission: RE | Admit: 2017-03-17 | Discharge: 2017-03-17 | Disposition: A | Discharge status: Home / Self Care | Payer: Medicare Other | Source: Ambulatory Visit | Attending: Internal Medicine | Admitting: Internal Medicine

## 2017-03-17 DIAGNOSIS — R0600 Dyspnea, unspecified: Secondary | ICD-10-CM

## 2017-03-17 DIAGNOSIS — Z955 Presence of coronary angioplasty implant and graft: Secondary | ICD-10-CM | POA: Diagnosis not present

## 2017-03-17 DIAGNOSIS — R0689 Other abnormalities of breathing: Secondary | ICD-10-CM

## 2017-03-17 NOTE — Progress Notes (Signed)
Daily Session Note  Patient Details  Name: Caleb Butler MRN: [id number] Date of Birth: [birth year] Referring Provider:     CARDIAC REHAB PHASE II ORIENTATION from 03/09/2017 in [place]  Referring Provider  [name]      Encounter Date: 03/17/2017  Check In:     Session Check In - 03/17/17 0926      Check-In   Location [facility]   Staff Present Russella Dar, MS, EP, [facility], Exercise Physiologist;Shaneca Orne Luther Parody, BS, EP, Exercise Physiologist   Supervising physician immediately available to respond to emergencies See telemetry face sheet for immediately available MD   Medication changes reported     No   Fall or balance concerns reported    No   Warm-up and Cool-down Performed as group-led instruction   Resistance Training Performed Yes   VAD Patient? No     Pain Assessment   Currently in Pain? No/denies   Pain Score 0-No pain   Multiple Pain Sites No      Capillary Blood Glucose: No results found for this or any previous visit (from the past 24 hour(s)).    History  Smoking Status  . Never Smoker  Smokeless Tobacco  . Never Used    Goals Met:  Independence with exercise equipment Exercise tolerated well No report of cardiac concerns or symptoms Strength training completed today  Goals Unmet:  Not Applicable  Comments: Check out 1030   [name] is Medical Director for [place] and Pulmonary Rehab.

## 2017-03-20 ENCOUNTER — Encounter (HOSPITAL_COMMUNITY)
Admission: RE | Admit: 2017-03-20 | Discharge: 2017-03-20 | Disposition: A | Discharge status: Home / Self Care | Payer: Medicare Other | Source: Ambulatory Visit | Attending: Internal Medicine | Admitting: Internal Medicine

## 2017-03-20 DIAGNOSIS — R0600 Dyspnea, unspecified: Secondary | ICD-10-CM

## 2017-03-20 DIAGNOSIS — Z955 Presence of coronary angioplasty implant and graft: Secondary | ICD-10-CM | POA: Diagnosis not present

## 2017-03-20 DIAGNOSIS — R0689 Other abnormalities of breathing: Secondary | ICD-10-CM

## 2017-03-20 NOTE — Progress Notes (Signed)
Daily Session Note  Patient Details  Name: Mikaele Stecher MRN: [id number] Date of Birth: Jun 25, 1945 Referring Provider:     CARDIAC REHAB PHASE II ORIENTATION from 03/09/2017 in [place]  Referring Provider  [name]      Encounter Date: 03/20/2017  Check In:     Session Check In - 03/20/17 0930      Check-In   Location [facility]   Staff Present Aundra Dubin, RN, BSN;Gregory Luther Parody, BS, EP, Exercise Physiologist   Supervising physician immediately available to respond to emergencies See telemetry face sheet for immediately available MD   Medication changes reported     No   Fall or balance concerns reported    No   Warm-up and Cool-down Performed as group-led instruction   Resistance Training Performed Yes   VAD Patient? No     Pain Assessment   Currently in Pain? No/denies   Pain Score 0-No pain   Multiple Pain Sites No      Capillary Blood Glucose: No results found for this or any previous visit (from the past 24 hour(s)).    History  Smoking Status  . Never Smoker  Smokeless Tobacco  . Never Used    Goals Met:  Independence with exercise equipment Exercise tolerated well No report of cardiac concerns or symptoms Strength training completed today  Goals Unmet:  Not Applicable  Comments: Check out 1030.   [name] is Medical Director for [facility] Cardiac and Pulmonary Rehab.

## 2017-03-22 ENCOUNTER — Encounter (HOSPITAL_COMMUNITY)
Admission: RE | Admit: 2017-03-22 | Discharge: 2017-03-22 | Disposition: A | Discharge status: Home / Self Care | Payer: Medicare Other | Source: Ambulatory Visit | Attending: Internal Medicine | Admitting: Internal Medicine

## 2017-03-22 DIAGNOSIS — Z955 Presence of coronary angioplasty implant and graft: Secondary | ICD-10-CM | POA: Diagnosis not present

## 2017-03-22 DIAGNOSIS — R0600 Dyspnea, unspecified: Secondary | ICD-10-CM

## 2017-03-22 DIAGNOSIS — R0689 Other abnormalities of breathing: Secondary | ICD-10-CM

## 2017-03-22 NOTE — Progress Notes (Signed)
Daily Session Note  Patient Details  Name: Victorhugo Preis MRN: [id number] Date of Birth: 10-Feb-1946 Referring Provider:     CARDIAC REHAB PHASE II ORIENTATION from 03/09/2017 in [place]  Referring Provider  [name]      Encounter Date: 03/22/2017  Check In:     Session Check In - 03/22/17 0930      Check-In   Location [facility]   Staff Present Aundra Dubin, RN, BSN;Gregory Luther Parody, BS, EP, Exercise Physiologist   Supervising physician immediately available to respond to emergencies See telemetry face sheet for immediately available MD   Medication changes reported     No   Fall or balance concerns reported    No   Warm-up and Cool-down Performed as group-led instruction   Resistance Training Performed Yes   VAD Patient? No     Pain Assessment   Currently in Pain? No/denies   Pain Score 0-No pain   Multiple Pain Sites No      Capillary Blood Glucose: No results found for this or any previous visit (from the past 24 hour(s)).    History  Smoking Status  . Never Smoker  Smokeless Tobacco  . Never Used    Goals Met:  Independence with exercise equipment Exercise tolerated well No report of cardiac concerns or symptoms Strength training completed today  Goals Unmet:  Not Applicable  Comments: Check out 1030.   [name] is Medical Director for [facility] Card[facility] and [facility] Re[facility].

## 2017-03-23 NOTE — Progress Notes (Signed)
Patient received the take home exercise plan this day 03/22/2017. THR was addressed as were safe ways of staying active when not in CR. Patient demonstrated an understanding and was encouraged to ask any questions in the future.

## 2017-03-24 ENCOUNTER — Encounter (HOSPITAL_COMMUNITY)
Admission: RE | Admit: 2017-03-24 | Discharge: 2017-03-24 | Disposition: A | Discharge status: Home / Self Care | Payer: Medicare Other | Source: Ambulatory Visit | Attending: Internal Medicine | Admitting: Internal Medicine

## 2017-03-24 DIAGNOSIS — Z955 Presence of coronary angioplasty implant and graft: Secondary | ICD-10-CM | POA: Diagnosis not present

## 2017-03-24 DIAGNOSIS — R0689 Other abnormalities of breathing: Secondary | ICD-10-CM

## 2017-03-24 DIAGNOSIS — R0600 Dyspnea, unspecified: Secondary | ICD-10-CM

## 2017-03-24 NOTE — Progress Notes (Signed)
Daily Session Note  Patient Details  Name: Caleb Butler MRN: [id number] Date of Birth: 02-14-1946 Referring Provider:     CARDIAC REHAB PHASE II ORIENTATION from 03/09/2017 in [place]  Referring Provider  [name]      Encounter Date: 03/24/2017  Check In:     Session Check In - 03/24/17 0930      Check-In   Location [facility]   Staff Present Aundra Dubin, RN, BSN;Gregory Luther Parody, BS, EP, Exercise Physiologist   Supervising physician immediately available to respond to emergencies See telemetry face sheet for immediately available MD   Medication changes reported     No   Fall or balance concerns reported    No   Warm-up and Cool-down Performed as group-led instruction   Resistance Training Performed Yes   VAD Patient? No     Pain Assessment   Currently in Pain? No/denies   Pain Score 0-No pain   Multiple Pain Sites No      Capillary Blood Glucose: No results found for this or any previous visit (from the past 24 hour(s)).      Exercise Prescription Changes - 03/23/17 1500      Response to Exercise   Blood Pressure (Admit) 100/58   Blood Pressure (Exercise) 100/60   Blood Pressure (Exit) 100/60   Heart Rate (Admit) 86 bpm   Heart Rate (Exercise) 106 bpm   Heart Rate (Exit) 86 bpm   Rating of Perceived Exertion (Exercise) 11   Duration Progress to 30 minutes of  aerobic without signs/symptoms of physical distress   Intensity THRR New  [id number]     Progression   Progression Continue to progress workloads to maintain intensity without signs/symptoms of physical distress.     Resistance Training   Training Prescription Yes   Weight 2   Reps 10-15     Treadmill   MPH 2.2   Grade 0   Minutes 15   METs 2.6     Recumbant Elliptical   Level 2   RPM 61   Watts 79   Minutes 20   METs 4     Home Exercise Plan   Plans to continue exercise at Home (comment)   Frequency Add 2 additional days to  program exercise sessions.   Initial Home Exercises Provided 03/22/17      History  Smoking Status  . Never Smoker  Smokeless Tobacco  . Never Used    Goals Met:  Independence with exercise equipment Exercise tolerated well No report of cardiac concerns or symptoms Strength training completed today  Goals Unmet:  Not Applicable  Comments: Check out 1030.   [name] is Medical Director for [facility] Card[facility] Re[facility].

## 2017-03-27 ENCOUNTER — Encounter (HOSPITAL_COMMUNITY)
Admission: RE | Admit: 2017-03-27 | Discharge: 2017-03-27 | Disposition: A | Discharge status: Home / Self Care | Payer: Medicare Other | Source: Ambulatory Visit | Attending: Internal Medicine | Admitting: Internal Medicine

## 2017-03-27 DIAGNOSIS — R0689 Other abnormalities of breathing: Secondary | ICD-10-CM

## 2017-03-27 DIAGNOSIS — R0600 Dyspnea, unspecified: Secondary | ICD-10-CM

## 2017-03-27 DIAGNOSIS — Z955 Presence of coronary angioplasty implant and graft: Secondary | ICD-10-CM | POA: Diagnosis not present

## 2017-03-27 NOTE — Progress Notes (Signed)
Daily Session Note  Patient Details  Name: Caleb Butler MRN: [id number] Date of Birth: 06-04-1945 Referring Provider:     CARDIAC REHAB PHASE II ORIENTATION from 03/09/2017 in [place]  Referring Provider  [name]      Encounter Date: 03/27/2017  Check In:     Session Check In - 03/27/17 0930      Check-In   Location [facility]   Staff Present Aundra Dubin, RN, BSN;Gregory Luther Parody, BS, EP, Exercise Physiologist   Supervising physician immediately available to respond to emergencies See telemetry face sheet for immediately available MD   Medication changes reported     No   Fall or balance concerns reported    No   Warm-up and Cool-down Performed as group-led instruction   Resistance Training Performed Yes   VAD Patient? No     Pain Assessment   Currently in Pain? No/denies   Pain Score 0-No pain   Multiple Pain Sites No      Capillary Blood Glucose: No results found for this or any previous visit (from the past 24 hour(s)).    History  Smoking Status  . Never Smoker  Smokeless Tobacco  . Never Used    Goals Met:  Independence with exercise equipment Exercise tolerated well No report of cardiac concerns or symptoms Strength training completed today  Goals Unmet:  Not Applicable  Comments: Check out 1030.   [name] is Medical Director for [facility] Card[facility] and [facility] Re[facility].

## 2017-03-29 ENCOUNTER — Encounter (HOSPITAL_COMMUNITY)
Admission: RE | Admit: 2017-03-29 | Discharge: 2017-03-29 | Disposition: A | Discharge status: Home / Self Care | Payer: Medicare Other | Source: Ambulatory Visit | Attending: Internal Medicine | Admitting: Internal Medicine

## 2017-03-29 DIAGNOSIS — R0689 Other abnormalities of breathing: Secondary | ICD-10-CM

## 2017-03-29 DIAGNOSIS — R0600 Dyspnea, unspecified: Secondary | ICD-10-CM

## 2017-03-29 DIAGNOSIS — Z955 Presence of coronary angioplasty implant and graft: Secondary | ICD-10-CM | POA: Diagnosis not present

## 2017-03-29 NOTE — Progress Notes (Signed)
Cardiac Individual Treatment Plan  Patient Details  Name: Keilyn Nadal MRN: [id number] Date of Birth: 1945-07-05 Referring Provider:     CARDIAC REHAB PHASE II ORIENTATION from 03/09/2017 in [place]  Referring Provider  [name]      Initial Encounter Date:    CARDIAC REHAB PHASE II ORIENTATION from 03/09/2017 in [place]  Date  03/09/17  Referring Provider  [name]      Visit Diagnosis: Status post coronary artery stent placement  Dyspnea and respiratory abnormalities  Patient's Home Medications on Admission:  Current Outpatient Prescriptions:  .  acetaminophen (TYLENOL) 325 MG tablet, Take 650 mg by mouth every 4 (four) hours as needed for mild pain. , Disp: , Rfl:  .  aspirin 81 MG EC tablet, Take 81 mg by mouth at bedtime. , Disp: , Rfl:  .  clopidogrel (PLAVIX) 75 MG tablet, Take 1 tablet (75 mg total) by mouth daily., Disp: 90 tablet, Rfl: 3 .  glucose blood test strip, Check BS BID Dx: E11.9, Disp: 100 each, Rfl: 5 .  isosorbide mononitrate (IMDUR) 30 MG 24 hr tablet, Take 1 tablet (30 mg total) by mouth daily. (Patient not taking: Reported on 03/09/2017), Disp: 90 tablet, Rfl: 3 .  Lancets (ACCU-CHEK MULTICLIX) lancets, Check BS BID Dx: E11.9, Disp: 100 each, Rfl: 12 .  levothyroxine (SYNTHROID, LEVOTHROID) 100 MCG tablet, Take 1 tablet (100 mcg total) by mouth daily before breakfast., Disp: 90 tablet, Rfl: 3 .  metFORMIN (GLUCOPHAGE) 1000 MG tablet, Take 1 tablet (1,000 mg total) by mouth 2 (two) times daily with a meal., Disp: 180 tablet, Rfl: 3 .  Multiple Vitamin (MULTIVITAMIN WITH MINERALS) TABS tablet, Take 1 tablet by mouth daily., Disp: , Rfl:  .  predniSONE (DELTASONE) 5 MG tablet, Take 5 mg by mouth daily with breakfast. Take with #2 of the 1 mg tablets daily, Disp: , Rfl:  .  rosuvastatin (CRESTOR) 20 MG tablet, Take 20 mg by mouth at bedtime. , Disp: , Rfl:  .  sitaGLIPtin (JANUVIA) 100 MG  tablet, Take 1 tablet (100 mg total) by mouth daily., Disp: 90 tablet, Rfl: 1 .  vitamin B-12 (CYANOCOBALAMIN) 1000 MCG tablet, Take 1,000 mcg by mouth daily., Disp: , Rfl:   Past Medical History: Past Medical History:  Diagnosis Date  . Anxiety   . Arthritis    "my back is loaded w/it" (01/06/2017)  . CAD (coronary artery disease)    80% ostial lesion in L circumflex (2017)  . Chronic lower back pain   . GERD (gastroesophageal reflux disease)   . Hiatal hernia   . High cholesterol   . Hypothyroidism   . IBS (irritable bowel syndrome)   . Numbness and tingling of right lower extremity    "since 11/30/2016" (01/06/2017)  . PMR (polymyalgia rheumatica) (HCC)   . Type 2 diabetes mellitus (HCC)     Tobacco Use: History  Smoking Status  . Never Smoker  Smokeless Tobacco  . Never Used    Labs: Recent Review Flowsheet Data    Labs for ITP Cardiac and Pulmonary Rehab Latest Ref Rng & Units 11/30/2015 07/07/2016 07/07/2016 07/07/2016 08/18/2016   Cholestrol 125 - 200 mg/dL 140 - - - -   LDLCALC <130 mg/dL 57 - - - -   HDL >=40 mg/dL 41 - - - -   Trlycerides <150 mg/dL 210(H) - - - -   Hemoglobin A1c <5.7 % 7.3(H) - - - 6.4(H)   PHART  7.350 - 7.450 - 7.382 - - -   PCO2ART 32.0 - 48.0 mmHg - 40.7 - - -   HCO3 20.0 - 28.0 mmol/L - 24.2 21.2 24.1 -   TCO2 0 - 100 mmol/L - 25 22 25  -   ACIDBASEDEF 0.0 - 2.0 mmol/L - 1.0 5.0(H) 2.0 -   O2SAT % - 95.0 68.0 69.0 -      Capillary Blood Glucose: Lab Results  Component Value Date   GLUCAP 167 (H) 01/07/2017   GLUCAP 178 (H) 01/06/2017   GLUCAP 249 (H) 01/06/2017   GLUCAP 250 (H) 01/06/2017   GLUCAP 197 (H) 01/06/2017     Exercise Target Goals:    Exercise Program Goal: Individual exercise prescription set with THRR, safety & activity barriers. Participant demonstrates ability to understand and report RPE using BORG scale, to self-measure pulse accurately, and to acknowledge the importance of the exercise prescription.  Exercise  Prescription Goal: Starting with aerobic activity 30 plus minutes a day, 3 days per week for initial exercise prescription. Provide home exercise prescription and guidelines that participant acknowledges understanding prior to discharge.  Activity Barriers & Risk Stratification:     Activity Barriers & Cardiac Risk Stratification - 03/09/17 1401      Activity Barriers & Cardiac Risk Stratification   Activity Barriers Joint Problems;Shortness of Breath   Cardiac Risk Stratification Moderate  Moderate to High      6 Minute Walk:     6 Minute Walk    Row Name 03/09/17 1400         6 Minute Walk   Phase Initial     Distance 1250 feet     Distance % Change 0 %     Distance Feet Change 0 ft     Walk Time 6 minutes     # of Rest Breaks 0     MPH 2.36     METS 2.81     RPE 9     Perceived Dyspnea  9     VO2 Peak 9.28     Symptoms No     Resting HR 81 bpm     Resting BP 102/62     Resting Oxygen Saturation  96 %     Exercise Oxygen Saturation  during 6 min walk 96 %     Max Ex. HR 88 bpm     Max Ex. BP 116/64     2 Minute Post BP 104/62        Oxygen Initial Assessment:   Oxygen Re-Evaluation:   Oxygen Discharge (Final Oxygen Re-Evaluation):   Initial Exercise Prescription:     Initial Exercise Prescription - 03/09/17 1400      Date of Initial Exercise RX and Referring Provider   Date 03/09/17   Referring Provider [name]     Treadmill   MPH 2   Grade 0   Minutes 15   METs 2.53     Recumbant Elliptical   Level 1   RPM 79   Watts 46   Minutes 1.7   METs 3.4     Prescription Details   Frequency (times per week) 3   Duration Progress to 30 minutes of continuous aerobic without signs/symptoms of physical distress     Intensity   THRR 40-80% of Max Heartrate [phone]   Ratings of Perceived Exertion 11-13   Perceived Dyspnea 0-4     Progression   Progression Continue progressive overload as per policy without signs/symptoms or physical  distress.     Resistance Training   Training Prescription Yes   Weight 1   Reps 10-15      Perform Capillary Blood Glucose checks as needed.  Exercise Prescription Changes:      Exercise Prescription Changes    Row Name 03/23/17 0700 03/23/17 1500           Response to Exercise   Blood Pressure (Admit)  - 100/58      Blood Pressure (Exercise)  - 100/60      Blood Pressure (Exit)  - 100/60      Heart Rate (Admit)  - 86 bpm      Heart Rate (Exercise)  - 106 bpm      Heart Rate (Exit)  - 86 bpm      Rating of Perceived Exertion (Exercise)  - 11      Duration  - Progress to 30 minutes of  aerobic without signs/symptoms of physical distress      Intensity  - THRR New  [phone]        Progression   Progression  - Continue to progress workloads to maintain intensity without signs/symptoms of physical distress.        Resistance Training   Training Prescription Yes Yes      Weight 1 2      Reps 10-15 10-15        Treadmill   MPH 2 2.2      Grade 0 0      Minutes 15 15      METs 2.53 2.6        Recumbant Elliptical   Level 1 2      RPM 79 61      Watts 46 79      Minutes 1.7 20      METs 3.4 4        Home Exercise Plan   Plans to continue exercise at Home (comment) Home (comment)      Frequency Add 2 additional days to program exercise sessions. Add 2 additional days to program exercise sessions.      Initial Home Exercises Provided 03/22/17 03/22/17         Exercise Comments:      Exercise Comments    Row Name 03/13/17 1438 03/23/17 0736 03/23/17 1522       Exercise Comments Patient has not yet started CR and will be progressed in time.  Patient received the take home exercise plan this day 03/22/2017. THR was addressed as were safe ways of staying active when not in CR. Patient demonstrated an understanding and was encouraged to ask any questions in the future.  Patient is doing well in CR. He is getting back to where he was when he left [facility]. 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        Exercise Goals and Review:      Exercise Goals    Row Name 03/09/17 1404             Exercise Goals   Increase Physical Activity Yes       Intervention Provide advice, education, support and counseling about physical activity/exercise needs.;Develop an individualized exercise prescription for aerobic and resistive training based on initial evaluation findings, risk stratification, comorbidities and participant's personal goals.       Expected Outcomes Achievement of increased cardiorespiratory fitness and enhanced flexibility, muscular endurance  and strength shown through measurements of functional capacity and personal statement of participant.       Increase Strength and Stamina Yes       Intervention Provide advice, education, support and counseling about physical activity/exercise needs.;Develop an individualized exercise prescription for aerobic and resistive training based on initial evaluation findings, risk stratification, comorbidities and participant's personal goals.       Expected Outcomes Achievement of increased cardiorespiratory fitness and enhanced flexibility, muscular endurance and strength shown through measurements of functional capacity and personal statement of participant.       Able to understand and use rate of perceived exertion (RPE) scale Yes       Intervention Provide education and explanation on how to use RPE scale       Expected Outcomes Short Term: Able to use RPE daily in rehab to express subjective intensity level;Long Term:  Able to use RPE to guide intensity level when exercising independently       Able to understand and use Dyspnea scale Yes       Intervention Provide education and explanation on how to use Dyspnea scale       Expected Outcomes Short Term: Able to use Dyspnea scale daily in rehab to express subjective sense of shortness of  breath during exertion;Long Term: Able to use Dyspnea scale to guide intensity level when exercising independently       Knowledge and understanding of Target Heart Rate Range (THRR) Yes       Intervention Provide education and explanation of THRR including how the numbers were predicted and where they are located for reference       Expected Outcomes Short Term: Able to state/look up THRR;Short Term: Able to use daily as guideline for intensity in rehab;Long Term: Able to use THRR to govern intensity when exercising independently       Able to check pulse independently Yes       Intervention Provide education and demonstration on how to check pulse in carotid and radial arteries.;Review the importance of being able to check your own pulse for safety during independent exercise       Expected Outcomes Short Term: Able to explain why pulse checking is important during independent exercise;Long Term: Able to check pulse independently and accurately       Understanding of Exercise Prescription Yes       Intervention Provide education, explanation, and written materials on patient's individual exercise prescription       Expected Outcomes Short Term: Able to explain program exercise prescription;Long Term: Able to explain home exercise prescription to exercise independently          Exercise Goals Re-Evaluation :     Exercise Goals Re-Evaluation    Row Name 03/23/17 1521             Exercise Goal Re-Evaluation   Exercise Goals Review Increase Physical Activity;Increase Strength and Stamina;Knowledge and understanding of Target Heart Rate Range (THRR)       Comments Patient is doing well in CR. He is getting back to where he was when he left [facility]. 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        Expected Outcomes Patient wishes to breathe easier and to lose weight.            Discharge Exercise Prescription (Final Exercise  Prescription Changes):     Exercise Prescription Changes -  03/23/17 1500      Response to Exercise   Blood Pressure (Admit) 100/58   Blood Pressure (Exercise) 100/60   Blood Pressure (Exit) 100/60   Heart Rate (Admit) 86 bpm   Heart Rate (Exercise) 106 bpm   Heart Rate (Exit) 86 bpm   Rating of Perceived Exertion (Exercise) 11   Duration Progress to 30 minutes of  aerobic without signs/symptoms of physical distress   Intensity THRR New  [phone]     Progression   Progression Continue to progress workloads to maintain intensity without signs/symptoms of physical distress.     Resistance Training   Training Prescription Yes   Weight 2   Reps 10-15     Treadmill   MPH 2.2   Grade 0   Minutes 15   METs 2.6     Recumbant Elliptical   Level 2   RPM 61   Watts 79   Minutes 20   METs 4     Home Exercise Plan   Plans to continue exercise at Home (comment)   Frequency Add 2 additional days to program exercise sessions.   Initial Home Exercises Provided 03/22/17      Nutrition:  Target Goals: Understanding of nutrition guidelines, daily intake of sodium 1500mg , cholesterol 200mg , calories 30% from fat and 7% or less from saturated fats, daily to have 5 or more servings of fruits and vegetables.  Biometrics:     Pre Biometrics - 03/09/17 1404      Pre Biometrics   Height 5\' 9"  (1.753 m)   Weight 184 lb 1.6 oz (83.5 kg)   Waist Circumference 37 inches   Hip Circumference 39 inches   Waist to Hip Ratio 0.95 %   BMI (Calculated) 27.17   Triceps Skinfold 7 mm   % Body Fat 22.8 %   Grip Strength 72.27 kg   Flexibility 10.67 in   Single Leg Stand 7 seconds       Nutrition Therapy Plan and Nutrition Goals:     Nutrition Therapy & Goals - 03/29/17 1400      Nutrition Therapy   RD appointment defered Yes      Nutrition Discharge: Rate Your Plate Scores:     Nutrition Assessments - 03/09/17 1438      MEDFICTS Scores   Pre Score 41      Nutrition  Goals Re-Evaluation:     Nutrition Goals Re-Evaluation    Row Name 03/29/17 1401             Goals   Current Weight 188 lb 3.2 oz (85.4 kg)       Comment Patient continues to try to eat heart healthy. He has stopped eating sweets. He is on prednisone for arthritis which he attributes his weight gain to. He hopes to stop medication soon.        Expected Outcome Patient will continue to work toward meeting his nurtition goal.          Personal Goal #2 Re-Evaluation   Personal Goal #2 Continue to eat a heart healthy diet.           Nutrition Goals Discharge (Final Nutrition Goals Re-Evaluation):     Nutrition Goals Re-Evaluation - 03/29/17 1401      Goals   Current Weight 188 lb 3.2 oz (85.4 kg)   Comment Patient continues to try to eat heart healthy. He has stopped eating sweets. He is on prednisone for arthritis which he attributes his weight gain to.  He hopes to stop medication soon.    Expected Outcome Patient will continue to work toward meeting his nurtition goal.      Personal Goal #2 Re-Evaluation   Personal Goal #2 Continue to eat a heart healthy diet.       Psychosocial: Target Goals: Acknowledge presence or absence of significant depression and/or stress, maximize coping skills, provide positive support system. Participant is able to verbalize types and ability to use techniques and skills needed for reducing stress and depression.  Initial Review & Psychosocial Screening:     Initial Psych Review & Screening - 03/09/17 1444      Initial Review   Current issues with None Identified     Family Dynamics   Good Support System? Yes     Barriers   Psychosocial barriers to participate in program There are no identifiable barriers or psychosocial needs.     Screening Interventions   Interventions Encouraged to exercise      Quality of Life Scores:     Quality of Life - 03/09/17 1405      Quality of Life Scores   Health/Function Pre 23.2 %    Socioeconomic Pre 27.56 %   Psych/Spiritual Pre 23.86 %   Family Pre 25.13 %   GLOBAL Pre 24.59 %      PHQ-9: Recent Review Flowsheet Data    Depression screen [facility] 2/9 03/09/2017 09/06/2016 08/18/2016 01/18/2016   Decreased Interest 0 1 0 0   Down, Depressed, Hopeless 0 1 0 0   PHQ - 2 Score 0 2 0 0   Altered sleeping 2 3 0 -   Tired, decreased energy 2 3 0 -   Change in appetite 1 2 0 -   Feeling bad or failure about yourself  0 0 0 -   Trouble concentrating 1 1 0 -   Moving slowly or fidgety/restless 1 1 0 -   Suicidal thoughts 0 0 0 -   PHQ-9 Score 7 12 0 -   Difficult doing work/chores Not difficult at all Somewhat difficult Not difficult at all -     Interpretation of Total Score  Total Score Depression Severity:  1-4 = Minimal depression, 5-9 = Mild depression, 10-14 = Moderate depression, 15-19 = Moderately severe depression, 20-27 = Severe depression   Psychosocial Evaluation and Intervention:     Psychosocial Evaluation - 03/09/17 1445      Psychosocial Evaluation & Interventions   Interventions Encouraged to exercise with the program and follow exercise prescription   Continue Psychosocial Services  No Follow up required  Patient is in a better place this time. QOL scores are 24. 59.      Psychosocial Re-Evaluation:     Psychosocial Re-Evaluation    Row Name 03/29/17 1403             Psychosocial Re-Evaluation   Current issues with Current Depression       Comments Patient is not interested in treatment.        Expected Outcomes Patient's QOL score was 24.59 and his PHQ-9 score was 7.       Interventions Encouraged to attend Pulmonary Rehabilitation for the exercise;Stress management education;Relaxation education       Continue Psychosocial Services  Follow up required by staff          Psychosocial Discharge (Final Psychosocial Re-Evaluation):     Psychosocial Re-Evaluation - 03/29/17 1403      Psychosocial Re-Evaluation   Current issues with  Current Depression  Comments Patient is not interested in treatment.    Expected Outcomes Patient's QOL score was 24.59 and his PHQ-9 score was 7.   Interventions Encouraged to attend Pulmonary Rehabilitation for the exercise;Stress management education;Relaxation education   Continue Psychosocial Services  Follow up required by staff      Vocational Rehabilitation: Provide vocational rehab assistance to qualifying candidates.   Vocational Rehab Evaluation & Intervention:     Vocational Rehab - 03/09/17 1415      Initial Vocational Rehab Evaluation & Intervention   Assessment shows need for Vocational Rehabilitation No      Education: Education Goals: Education classes will be provided on a weekly basis, covering required topics. Participant will state understanding/return demonstration of topics presented.  Learning Barriers/Preferences:     Learning Barriers/Preferences - 03/09/17 1414      Learning Barriers/Preferences   Learning Barriers None   Learning Preferences Skilled Demonstration;Verbal Instruction;Individual Instruction;Group Instruction      Education Topics: Hypertension, Hypertension Reduction -Define heart disease and high blood pressure. Discus how high blood pressure affects the body and ways to reduce high blood pressure.   Exercise and Your Heart -Discuss why it is important to exercise, the FITT principles of exercise, normal and abnormal responses to exercise, and how to exercise safely.   Angina -Discuss definition of angina, causes of angina, treatment of angina, and how to decrease risk of having angina.   Cardiac Medications -Review what the following cardiac medications are used for, how they affect the body, and side effects that may occur when taking the medications.  Medications include Aspirin, Beta blockers, calcium channel blockers, ACE Inhibitors, angiotensin receptor blockers, diuretics, digoxin, and  antihyperlipidemics.   Congestive Heart Failure -Discuss the definition of CHF, how to live with CHF, the signs and symptoms of CHF, and how keep track of weight and sodium intake.   Heart Disease and Intimacy -Discus the effect sexual activity has on the heart, how changes occur during intimacy as we age, and safety during sexual activity.   Smoking Cessation / COPD -Discuss different methods to quit smoking, the health benefits of quitting smoking, and the definition of COPD.   Nutrition I: Fats -Discuss the types of cholesterol, what cholesterol does to the heart, and how cholesterol levels can be controlled.   Nutrition II: Labels -Discuss the different components of food labels and how to read food label   PULMONARY REHAB OTHER RESPIRATORY from 12/29/2016 in [place]  Date  11/24/16  Educator  [place]  Instruction Review Code  2- meets goals/outcomes      Heart Parts and Heart Disease -Discuss the anatomy of the heart, the pathway of blood circulation through the heart, and these are affected by heart disease.   Stress I: Signs and Symptoms -Discuss the causes of stress, how stress may lead to anxiety and depression, and ways to limit stress.   PULMONARY REHAB OTHER RESPIRATORY from 12/29/2016 in [place]  Date  12/08/16  Educator  [place]  Instruction Review Code  2- meets goals/outcomes      Stress II: Relaxation -Discuss different types of relaxation techniques to limit stress.   CARDIAC REHAB PHASE II EXERCISE from 03/22/2017 in [place]  Date  03/15/17  Educator  DJ  Instruction Review Code  2- Demonstrated Understanding      Warning Signs of Stroke / TIA -Discuss definition of a stroke, what the signs and symptoms are of a stroke, and how to identify  when someone is having stroke.   CARDIAC REHAB PHASE II EXERCISE from 03/22/2017 in [place]  Date  03/22/17  Educator   [place]  Instruction Review Code  2- Demonstrated Understanding      Knowledge Questionnaire Score:     Knowledge Questionnaire Score - 03/09/17 1415      Knowledge Questionnaire Score   Pre Score 19/24      Core Components/Risk Factors/Patient Goals at Admission:     Personal Goals and Risk Factors at Admission - 03/09/17 1441      Core Components/Risk Factors/Patient Goals on Admission    Weight Management Yes   Admit Weight 184 lb 1.6 oz (83.5 kg)   Goal Weight: Short Term 179 lb 1.6 oz (81.2 kg)   Goal Weight: Long Term 174 lb 1.6 oz (79 kg)   Expected Outcomes Short Term: Continue to assess and modify interventions until short term weight is achieved;Long Term: Adherence to nutrition and physical activity/exercise program aimed toward attainment of established weight goal   Personal Goal Other Yes   Personal Goal Breathe better, lose 10 lbs   Intervention Attend CR 3 x week and supplement with home exercise 2 x week.   Expected Outcomes Achieve personal goals.       Core Components/Risk Factors/Patient Goals Review:      Goals and Risk Factor Review    Row Name 03/29/17 1357             Core Components/Risk Factors/Patient Goals Review   Personal Goals Review Weight Management/Obesity;Improve shortness of breath with ADL's  Lose weight; get stronger; continue to breathe better.        Review Patient has completed 8 sessions gaining 4 lbs. He has cut down on the amount of sweets he is eating. He blames his weight gain on taking prednisone for his arthritis. He hopes to get off of medication soon. He says his appetite has improved so he is eating more. He says he feels better and is getting some stronger. He is able to do more around the house. Will continue to monitor.        Expected Outcomes Patient will complete the program and continue to work toward meeting his personal goals.           Core Components/Risk Factors/Patient Goals at Discharge (Final Review):       Goals and Risk Factor Review - 03/29/17 1357      Core Components/Risk Factors/Patient Goals Review   Personal Goals Review Weight Management/Obesity;Improve shortness of breath with ADL's  Lose weight; get stronger; continue to breathe better.    Review Patient has completed 8 sessions gaining 4 lbs. He has cut down on the amount of sweets he is eating. He blames his weight gain on taking prednisone for his arthritis. He hopes to get off of medication soon. He says his appetite has improved so he is eating more. He says he feels better and is getting some stronger. He is able to do more around the house. Will continue to monitor.    Expected Outcomes Patient will complete the program and continue to work toward meeting his personal goals.       ITP Comments:     ITP Comments    Row Name 03/09/17 1416           ITP Comments Mr. Janis is coming back to the program after having stent placement. He finished our pulmonary program 6 months ago.  Comments: ITP 30 Day REVIEW Patient doing well in the program. Will continue to monitor for progress.

## 2017-03-29 NOTE — Progress Notes (Signed)
Daily Session Note  Patient Details  Name: Hanson Medeiros MRN: [id number] Date of Birth: 27-Feb-1946 Referring Provider:     CARDIAC REHAB PHASE II ORIENTATION from 03/09/2017 in [facility]  Referring Provider  [name]      Encounter Date: 03/29/2017  Check In:     Session Check In - 03/29/17 0930      Check-In   Location [facility]   Staff Present Aundra Dubin, RN, BSN;Gregory Luther Parody, BS, EP, Exercise Physiologist   Supervising physician immediately available to respond to emergencies See telemetry face sheet for immediately available MD   Medication changes reported     No   Fall or balance concerns reported    No   Warm-up and Cool-down Performed as group-led instruction   Resistance Training Performed Yes   VAD Patient? No     Pain Assessment   Currently in Pain? No/denies   Pain Score 0-No pain   Multiple Pain Sites No      Capillary Blood Glucose: No results found for this or any previous visit (from the past 24 hour(s)).    History  Smoking Status  . Never Smoker  Smokeless Tobacco  . Never Used    Goals Met:  Independence with exercise equipment Exercise tolerated well No report of cardiac concerns or symptoms Strength training completed today  Goals Unmet:  Not Applicable  Comments: Check out 1030.   [name] is Medical Director for [facility] Cardiac and Pulmonary Rehab.

## 2017-03-31 ENCOUNTER — Encounter (HOSPITAL_COMMUNITY): Payer: Medicare Other

## 2017-04-03 ENCOUNTER — Encounter (HOSPITAL_COMMUNITY)
Admission: RE | Admit: 2017-04-03 | Discharge: 2017-04-03 | Disposition: A | Discharge status: Home / Self Care | Payer: Medicare Other | Source: Ambulatory Visit | Attending: Internal Medicine | Admitting: Internal Medicine

## 2017-04-03 DIAGNOSIS — K219 Gastro-esophageal reflux disease without esophagitis: Secondary | ICD-10-CM | POA: Insufficient documentation

## 2017-04-03 DIAGNOSIS — Z7984 Long term (current) use of oral hypoglycemic drugs: Secondary | ICD-10-CM | POA: Diagnosis not present

## 2017-04-03 DIAGNOSIS — K589 Irritable bowel syndrome without diarrhea: Secondary | ICD-10-CM | POA: Insufficient documentation

## 2017-04-03 DIAGNOSIS — E039 Hypothyroidism, unspecified: Secondary | ICD-10-CM | POA: Diagnosis not present

## 2017-04-03 DIAGNOSIS — Z7982 Long term (current) use of aspirin: Secondary | ICD-10-CM | POA: Insufficient documentation

## 2017-04-03 DIAGNOSIS — E119 Type 2 diabetes mellitus without complications: Secondary | ICD-10-CM | POA: Diagnosis not present

## 2017-04-03 DIAGNOSIS — R0689 Other abnormalities of breathing: Secondary | ICD-10-CM

## 2017-04-03 DIAGNOSIS — M353 Polymyalgia rheumatica: Secondary | ICD-10-CM | POA: Insufficient documentation

## 2017-04-03 DIAGNOSIS — I2510 Atherosclerotic heart disease of native coronary artery without angina pectoris: Secondary | ICD-10-CM | POA: Diagnosis not present

## 2017-04-03 DIAGNOSIS — F419 Anxiety disorder, unspecified: Secondary | ICD-10-CM | POA: Diagnosis not present

## 2017-04-03 DIAGNOSIS — Z7952 Long term (current) use of systemic steroids: Secondary | ICD-10-CM | POA: Diagnosis not present

## 2017-04-03 DIAGNOSIS — E7800 Pure hypercholesterolemia, unspecified: Secondary | ICD-10-CM | POA: Diagnosis not present

## 2017-04-03 DIAGNOSIS — R0600 Dyspnea, unspecified: Secondary | ICD-10-CM

## 2017-04-03 DIAGNOSIS — Z7902 Long term (current) use of antithrombotics/antiplatelets: Secondary | ICD-10-CM | POA: Insufficient documentation

## 2017-04-03 DIAGNOSIS — Z79899 Other long term (current) drug therapy: Secondary | ICD-10-CM | POA: Insufficient documentation

## 2017-04-03 DIAGNOSIS — Z955 Presence of coronary angioplasty implant and graft: Secondary | ICD-10-CM | POA: Diagnosis present

## 2017-04-03 NOTE — Progress Notes (Signed)
Daily Session Note  Patient Details  Name: Caleb Butler MRN: [id number] Date of Birth: 02-Feb-1946 Referring Provider:     CARDIAC REHAB PHASE II ORIENTATION from 03/09/2017 in [place]  Referring Provider  [name]      Encounter Date: 04/03/2017  Check In: Session Check In - 04/03/17 0930      Check-In   Location  [facility]    Staff Present  Aundra Dubin, RN, BSN;Gregory Luther Parody, BS, EP, Exercise Physiologist    Supervising physician immediately available to respond to emergencies  See telemetry face sheet for immediately available MD    Medication changes reported      No    Fall or balance concerns reported     No    Warm-up and Cool-down  Performed as group-led instruction    Resistance Training Performed  Yes    VAD Patient?  No      Pain Assessment   Currently in Pain?  No/denies    Pain Score  0-No pain    Multiple Pain Sites  No       Capillary Blood Glucose: No results found for this or any previous visit (from the past 24 hour(s)).    Social History   Tobacco Use  Smoking Status Never Smoker  Smokeless Tobacco Never Used    Goals Met:  Independence with exercise equipment Exercise tolerated well No report of cardiac concerns or symptoms Strength training completed today  Goals Unmet:  Not Applicable  Comments: Check out 1030.   [name] is Medical Director for [facility] Cardiac and Pulmonary Rehab.

## 2017-04-05 ENCOUNTER — Encounter (HOSPITAL_COMMUNITY)
Admission: RE | Admit: 2017-04-05 | Discharge: 2017-04-05 | Disposition: A | Discharge status: Home / Self Care | Payer: Medicare Other | Source: Ambulatory Visit | Attending: Internal Medicine | Admitting: Internal Medicine

## 2017-04-05 DIAGNOSIS — Z955 Presence of coronary angioplasty implant and graft: Secondary | ICD-10-CM

## 2017-04-05 NOTE — Progress Notes (Addendum)
Daily Session Note  Patient Details  Name: Caleb Butler MRN: [id number] Date of Birth: 02/14/1946 Referring Provider:     CARDIAC REHAB PHASE II ORIENTATION from 03/09/2017 in [place]  Referring Provider  [name]      Encounter Date: 04/05/2017  Check In: Session Check In - 04/05/17 0930      Check-In   Location  [facility]    Staff Present  Aundra Dubin, RN, BSN;Gregory Luther Parody, BS, EP, Exercise Physiologist    Supervising physician immediately available to respond to emergencies  See telemetry face sheet for immediately available MD    Medication changes reported      No    Fall or balance concerns reported     No    Warm-up and Cool-down  Performed as group-led instruction    Resistance Training Performed  Yes    VAD Patient?  No      Pain Assessment   Currently in Pain?  No/denies    Pain Score  0-No pain    Multiple Pain Sites  No       Capillary Blood Glucose: No results found for this or any previous visit (from the past 24 hour(s)).    Social History   Tobacco Use  Smoking Status Never Smoker  Smokeless Tobacco Never Used    Goals Met:  Independence with exercise equipment Exercise tolerated well No report of cardiac concerns or symptoms Strength training completed today  Goals Unmet:  Not Applicable  Comments: Check out 1030.   [name] is Medical Director for [facility] Cardiac and Pulmonary Rehab.

## 2017-04-07 ENCOUNTER — Encounter (HOSPITAL_COMMUNITY)
Admission: RE | Admit: 2017-04-07 | Discharge: 2017-04-07 | Disposition: A | Discharge status: Home / Self Care | Payer: Medicare Other | Source: Ambulatory Visit | Attending: Internal Medicine | Admitting: Internal Medicine

## 2017-04-07 ENCOUNTER — Encounter (HOSPITAL_COMMUNITY): Payer: Medicare Other

## 2017-04-07 DIAGNOSIS — Z955 Presence of coronary angioplasty implant and graft: Secondary | ICD-10-CM

## 2017-04-07 DIAGNOSIS — R0600 Dyspnea, unspecified: Secondary | ICD-10-CM

## 2017-04-07 DIAGNOSIS — R0689 Other abnormalities of breathing: Secondary | ICD-10-CM

## 2017-04-07 NOTE — Progress Notes (Signed)
Daily Session Note  Patient Details  Name: Caleb Butler MRN: [id number] Date of Birth: 07/14/1945 Referring Provider:     CARDIAC REHAB PHASE II ORIENTATION from 03/09/2017 in [place]  Referring Provider  [name]      Encounter Date: 04/07/2017  Check In: Session Check In - 04/07/17 0930      Check-In   Location  [facility]    Staff Present  Suzanne Boron, BS, EP, Exercise Physiologist;Skye Plamondon Wynetta Emery, RN, BSN    Supervising physician immediately available to respond to emergencies  See telemetry face sheet for immediately available MD    Medication changes reported      No    Fall or balance concerns reported     No    Warm-up and Cool-down  Performed as group-led instruction    Resistance Training Performed  Yes    VAD Patient?  No      Pain Assessment   Currently in Pain?  No/denies    Pain Score  0-No pain    Multiple Pain Sites  No       Capillary Blood Glucose: No results found for this or any previous visit (from the past 24 hour(s)).    Social History   Tobacco Use  Smoking Status Never Smoker  Smokeless Tobacco Never Used    Goals Met:  Independence with exercise equipment Exercise tolerated well No report of cardiac concerns or symptoms Strength training completed today  Goals Unmet:  Not Applicable  Comments: Check out 1030.   [name] is Medical Director for [facility] Cardiac and Pulmonary Rehab.

## 2017-04-10 ENCOUNTER — Encounter (HOSPITAL_COMMUNITY)
Admission: RE | Admit: 2017-04-10 | Discharge: 2017-04-10 | Disposition: A | Discharge status: Home / Self Care | Payer: Medicare Other | Source: Ambulatory Visit | Attending: Internal Medicine | Admitting: Internal Medicine

## 2017-04-10 ENCOUNTER — Ambulatory Visit: Payer: Medicare Other | Admitting: Internal Medicine

## 2017-04-10 DIAGNOSIS — R0689 Other abnormalities of breathing: Secondary | ICD-10-CM

## 2017-04-10 DIAGNOSIS — Z955 Presence of coronary angioplasty implant and graft: Secondary | ICD-10-CM | POA: Diagnosis not present

## 2017-04-10 DIAGNOSIS — R0600 Dyspnea, unspecified: Secondary | ICD-10-CM

## 2017-04-10 NOTE — Progress Notes (Signed)
Daily Session Note  Patient Details  Name: Caleb Butler MRN: [id number] Date of Birth: 06/01/1945 Referring Provider:     CARDIAC REHAB PHASE II ORIENTATION from 03/09/2017 in [place] CARDIAC [facility]  Referring Provider  Dr. Bensimhon      Encounter Date: 04/10/2017  Check In: Session Check In - 04/10/17 0930      Check-In   Location  [facility]    Staff Present  Gregory Cowan, BS, EP, Exercise Physiologist;Debra Johnson, RN, BSN    Supervising physician immediately available to respond to emergencies  See telemetry face sheet for immediately available MD    Medication changes reported      No    Fall or balance concerns reported     No    Warm-up and Cool-down  Performed on first and last piece of equipment    Resistance Training Performed  No    VAD Patient?  No      Pain Assessment   Currently in Pain?  No/denies    Pain Score  0-No pain    Multiple Pain Sites  No       Capillary Blood Glucose: No results found for this or any previous visit (from the past 24 hour(s)).    Social History   Tobacco Use  Smoking Status Never Smoker  Smokeless Tobacco Never Used    Goals Met:  Independence with exercise equipment Exercise tolerated well No report of cardiac concerns or symptoms Strength training completed today  Goals Unmet:  Not Applicable  Comments: Check out 1030.   Dr. Suresh Koneswaran is Medical Director for [place] Cardiac and Pulmonary Rehab. 

## 2017-04-12 ENCOUNTER — Encounter (HOSPITAL_COMMUNITY)
Admission: RE | Admit: 2017-04-12 | Discharge: 2017-04-12 | Disposition: A | Discharge status: Home / Self Care | Payer: Medicare Other | Source: Ambulatory Visit | Attending: Internal Medicine | Admitting: Internal Medicine

## 2017-04-12 DIAGNOSIS — Z955 Presence of coronary angioplasty implant and graft: Secondary | ICD-10-CM | POA: Diagnosis not present

## 2017-04-12 NOTE — Progress Notes (Signed)
Daily Session Note  Patient Details  Name: Caleb Butler MRN: [id number] Date of Birth: 1946/01/25 Referring Provider:     CARDIAC REHAB PHASE II ORIENTATION from 03/09/2017 in [place]  Referring Provider  [name]      Encounter Date: 04/12/2017  Check In: Session Check In - 04/12/17 0930      Check-In   Location  [facility]    Staff Present  Suzanne Boron, BS, EP, Exercise Physiologist;Michale Emmerich Wynetta Emery, RN, BSN    Supervising physician immediately available to respond to emergencies  See telemetry face sheet for immediately available MD    Medication changes reported      No    Fall or balance concerns reported     No    Warm-up and Cool-down  Performed as group-led instruction    Resistance Training Performed  Yes    VAD Patient?  No      Pain Assessment   Currently in Pain?  No/denies    Pain Score  0-No pain    Multiple Pain Sites  No       Capillary Blood Glucose: No results found for this or any previous visit (from the past 24 hour(s)).  Exercise Prescription Changes - 04/11/17 1500      Response to Exercise   Blood Pressure (Admit)  100/62    Blood Pressure (Exercise)  120/68    Blood Pressure (Exit)  106/60    Heart Rate (Admit)  84 bpm    Heart Rate (Exercise)  109 bpm    Heart Rate (Exit)  94 bpm    Rating of Perceived Exertion (Exercise)  11    Duration  Progress to 30 minutes of  aerobic without signs/symptoms of physical distress    Intensity  THRR New 110-124-137      Progression   Progression  Continue to progress workloads to maintain intensity without signs/symptoms of physical distress.      Resistance Training   Training Prescription  Yes    Weight  2    Reps  10-15      Treadmill   MPH  2.7    Grade  0    Minutes  15    METs  3.1      Recumbant Elliptical   Level  2    RPM  63    Watts  80    Minutes  20    METs  4.2      Home Exercise Plan   Plans to continue exercise at   Home (comment)    Frequency  Add 2 additional days to program exercise sessions.    Initial Home Exercises Provided  03/22/17       Social History   Tobacco Use  Smoking Status Never Smoker  Smokeless Tobacco Never Used    Goals Met:  Independence with exercise equipment Exercise tolerated well No report of cardiac concerns or symptoms Strength training completed today  Goals Unmet:  Not Applicable  Comments: Check out 1030.   [name] is Medical Director for [facility] Cardiac and Pulmonary Rehab.

## 2017-04-14 ENCOUNTER — Encounter (HOSPITAL_COMMUNITY)
Admission: RE | Admit: 2017-04-14 | Discharge: 2017-04-14 | Disposition: A | Discharge status: Home / Self Care | Payer: Medicare Other | Source: Ambulatory Visit | Attending: Internal Medicine | Admitting: Internal Medicine

## 2017-04-14 DIAGNOSIS — Z955 Presence of coronary angioplasty implant and graft: Secondary | ICD-10-CM | POA: Diagnosis not present

## 2017-04-14 DIAGNOSIS — R0689 Other abnormalities of breathing: Secondary | ICD-10-CM

## 2017-04-14 DIAGNOSIS — R0600 Dyspnea, unspecified: Secondary | ICD-10-CM

## 2017-04-14 NOTE — Progress Notes (Signed)
Daily Session Note  Patient Details  Name: Caleb Butler MRN: [id number] Date of Birth: Dec 13, 1945 Referring Provider:     CARDIAC REHAB PHASE II ORIENTATION from 03/09/2017 in [place]  Referring Provider  [name]      Encounter Date: 04/14/2017  Check In: Session Check In - 04/14/17 0930      Check-In   Location  [facility]    Staff Present  Aundra Dubin, RN, BSN;Gregory Luther Parody, BS, EP, Exercise Physiologist    Supervising physician immediately available to respond to emergencies  See telemetry face sheet for immediately available MD    Medication changes reported      No    Fall or balance concerns reported     No    Warm-up and Cool-down  Performed as group-led instruction    Resistance Training Performed  Yes    VAD Patient?  No      Pain Assessment   Currently in Pain?  No/denies    Pain Score  0-No pain    Multiple Pain Sites  No       Capillary Blood Glucose: No results found for this or any previous visit (from the past 24 hour(s)).    Social History   Tobacco Use  Smoking Status Never Smoker  Smokeless Tobacco Never Used    Goals Met:  Independence with exercise equipment Exercise tolerated well No report of cardiac concerns or symptoms Strength training completed today  Goals Unmet:  Not Applicable  Comments: Check out 1030.   [name] is Medical Director for [facility] Cardiac and Pulmonary Rehab.

## 2017-04-17 ENCOUNTER — Encounter (HOSPITAL_COMMUNITY)
Admission: RE | Admit: 2017-04-17 | Discharge: 2017-04-17 | Disposition: A | Discharge status: Home / Self Care | Payer: Medicare Other | Source: Ambulatory Visit | Attending: Internal Medicine | Admitting: Internal Medicine

## 2017-04-17 ENCOUNTER — Other Ambulatory Visit (HOSPITAL_COMMUNITY): Payer: Self-pay | Admitting: *Deleted

## 2017-04-17 DIAGNOSIS — Z955 Presence of coronary angioplasty implant and graft: Secondary | ICD-10-CM | POA: Diagnosis not present

## 2017-04-17 MED ORDER — ROSUVASTATIN CALCIUM 20 MG PO TABS: 20.0000 mg | ORAL_TABLET | Freq: Every day | ORAL | 3 refills | Status: DC

## 2017-04-17 NOTE — Progress Notes (Signed)
Daily Session Note  Patient Details  Name: Halden Phegley MRN: [id number] Date of Birth: Dec 28, 1945 Referring Provider:     CARDIAC REHAB PHASE II ORIENTATION from 03/09/2017 in [place]  Referring Provider  [name]      Encounter Date: 04/17/2017  Check In: Session Check In - 04/17/17 0930      Check-In   Location  [facility]    Staff Present  Aundra Dubin, RN, BSN;Gregory Luther Parody, BS, EP, Exercise Physiologist    Supervising physician immediately available to respond to emergencies  See telemetry face sheet for immediately available MD    Medication changes reported      No    Fall or balance concerns reported     No    Warm-up and Cool-down  Performed as group-led instruction    Resistance Training Performed  Yes    VAD Patient?  No      Pain Assessment   Currently in Pain?  No/denies    Pain Score  0-No pain    Multiple Pain Sites  No       Capillary Blood Glucose: No results found for this or any previous visit (from the past 24 hour(s)).    Social History   Tobacco Use  Smoking Status Never Smoker  Smokeless Tobacco Never Used    Goals Met:  Independence with exercise equipment Exercise tolerated well No report of cardiac concerns or symptoms Strength training completed today  Goals Unmet:  Not Applicable  Comments: Check out 1030.   [name] is Medical Director for [facility] Cardiac and Pulmonary Rehab.

## 2017-04-19 ENCOUNTER — Encounter (HOSPITAL_COMMUNITY)
Admission: RE | Admit: 2017-04-19 | Discharge: 2017-04-19 | Disposition: A | Discharge status: Home / Self Care | Payer: Medicare Other | Source: Ambulatory Visit | Attending: Internal Medicine | Admitting: Internal Medicine

## 2017-04-19 ENCOUNTER — Other Ambulatory Visit: Payer: Self-pay | Admitting: Family Medicine

## 2017-04-19 DIAGNOSIS — R0600 Dyspnea, unspecified: Secondary | ICD-10-CM

## 2017-04-19 DIAGNOSIS — R0689 Other abnormalities of breathing: Secondary | ICD-10-CM

## 2017-04-19 DIAGNOSIS — Z955 Presence of coronary angioplasty implant and graft: Secondary | ICD-10-CM

## 2017-04-19 MED ORDER — LEVOTHYROXINE SODIUM 100 MCG PO TABS: 100.0000 ug | ORAL_TABLET | Freq: Every day | ORAL | 3 refills | Status: DC

## 2017-04-19 MED ORDER — SITAGLIPTIN PHOSPHATE 100 MG PO TABS: 100.0000 mg | ORAL_TABLET | Freq: Every day | ORAL | 1 refills | Status: DC

## 2017-04-19 NOTE — Addendum Note (Signed)
Addended by: Shary Decamp B on: 04/19/2017 05:03 PM   Modules accepted: Orders

## 2017-04-19 NOTE — Progress Notes (Signed)
Daily Session Note  Patient Details  Name: Caleb Butler MRN: [id number] Date of Birth: Aug 26, 1945 Referring Provider:     CARDIAC REHAB PHASE II ORIENTATION from 03/09/2017 in [place]  Referring Provider  [name]      Encounter Date: 04/19/2017  Check In: Session Check In - 04/19/17 0930      Check-In   Location  [facility]    Staff Present  Aundra Dubin, RN, BSN;Gregory Luther Parody, BS, EP, Exercise Physiologist    Supervising physician immediately available to respond to emergencies  See telemetry face sheet for immediately available MD    Medication changes reported      No    Fall or balance concerns reported     No    Warm-up and Cool-down  Performed as group-led instruction    Resistance Training Performed  Yes    VAD Patient?  No      Pain Assessment   Currently in Pain?  No/denies    Pain Score  0-No pain    Multiple Pain Sites  No       Capillary Blood Glucose: No results found for this or any previous visit (from the past 24 hour(s)).    Social History   Tobacco Use  Smoking Status Never Smoker  Smokeless Tobacco Never Used    Goals Met:  Independence with exercise equipment Exercise tolerated well No report of cardiac concerns or symptoms Strength training completed today  Goals Unmet:  Not Applicable  Comments: Check out 1030.   [name] is Medical Director for [facility] Cardiac and Pulmonary Rehab.

## 2017-04-21 ENCOUNTER — Encounter (HOSPITAL_COMMUNITY): Payer: Medicare Other

## 2017-04-24 ENCOUNTER — Encounter (HOSPITAL_COMMUNITY)
Admission: RE | Admit: 2017-04-24 | Discharge: 2017-04-24 | Disposition: A | Discharge status: Home / Self Care | Payer: Medicare Other | Source: Ambulatory Visit | Attending: Internal Medicine | Admitting: Internal Medicine

## 2017-04-24 DIAGNOSIS — Z955 Presence of coronary angioplasty implant and graft: Secondary | ICD-10-CM

## 2017-04-24 NOTE — Progress Notes (Signed)
Daily Session Note  Patient Details  Name: Caleb Butler MRN: [id number] Date of Birth: [birth year] Referring Provider:     CARDIAC REHAB PHASE II ORIENTATION from 03/09/2017 in [place]  Referring Provider  [name]      Encounter Date: 04/24/2017  Check In: Session Check In - 04/24/17 0930      Check-In   Location  [facility]    Staff Present  Aundra Dubin, RN, BSN;Gregory Luther Parody, BS, EP, Exercise Physiologist;Caleb Coad, MS, EP, [facility], Exercise Physiologist    Supervising physician immediately available to respond to emergencies  See telemetry face sheet for immediately available MD    Medication changes reported      No    Fall or balance concerns reported     No    Warm-up and Cool-down  Performed as group-led instruction    Resistance Training Performed  Yes    VAD Patient?  No      Pain Assessment   Currently in Pain?  No/denies    Pain Score  0-No pain    Multiple Pain Sites  No       Capillary Blood Glucose: No results found for this or any previous visit (from the past 24 hour(s)).    Social History   Tobacco Use  Smoking Status Never Smoker  Smokeless Tobacco Never Used    Goals Met:  Independence with exercise equipment Exercise tolerated well No report of cardiac concerns or symptoms Strength training completed today  Goals Unmet:  Not Applicable  Comments: Check out 1030.   [name] is Medical Director for [facility] Cardiac and Pulmonary Rehab.

## 2017-04-26 ENCOUNTER — Encounter (HOSPITAL_COMMUNITY)
Admission: RE | Admit: 2017-04-26 | Discharge: 2017-04-26 | Disposition: A | Discharge status: Home / Self Care | Payer: Medicare Other | Source: Ambulatory Visit | Attending: Internal Medicine | Admitting: Internal Medicine

## 2017-04-26 DIAGNOSIS — Z955 Presence of coronary angioplasty implant and graft: Secondary | ICD-10-CM

## 2017-04-26 DIAGNOSIS — R0689 Other abnormalities of breathing: Secondary | ICD-10-CM

## 2017-04-26 DIAGNOSIS — R0600 Dyspnea, unspecified: Secondary | ICD-10-CM

## 2017-04-26 NOTE — Progress Notes (Signed)
Daily Session Note  Patient Details  Name: Caleb Butler MRN: [id number] Date of Birth: [birth year] Referring Provider:     CARDIAC REHAB PHASE II ORIENTATION from 03/09/2017 in [place]  Referring Provider  [name]      Encounter Date: 04/26/2017  Check In: Session Check In - 04/26/17 1000      Check-In   Location  [facility]    Staff Present  Suzanne Boron, BS, EP, Exercise Physiologist;Debra Wynetta Emery, RN, BSN    Supervising physician immediately available to respond to emergencies  See telemetry face sheet for immediately available MD    Medication changes reported      No    Fall or balance concerns reported     No    Warm-up and Cool-down  Performed as group-led instruction    Resistance Training Performed  Yes    VAD Patient?  No      Pain Assessment   Currently in Pain?  No/denies    Pain Score  0-No pain    Multiple Pain Sites  No       Capillary Blood Glucose: No results found for this or any previous visit (from the past 24 hour(s)).    Social History   Tobacco Use  Smoking Status Never Smoker  Smokeless Tobacco Never Used    Goals Met:  Independence with exercise equipment Exercise tolerated well No report of cardiac concerns or symptoms Strength training completed today  Goals Unmet:  Not Applicable  Comments: Check out 1030   [name] is Medical Director for [place] and Pulmonary Rehab.

## 2017-04-26 NOTE — Progress Notes (Signed)
Cardiac Individual Treatment Plan  Patient Details  Name: Caleb Butler MRN: [id number] Date of Birth: [birth year] Referring Provider:     CARDIAC REHAB PHASE II ORIENTATION from 03/09/2017 in [place]  Referring Provider  [name]      Initial Encounter Date:    CARDIAC REHAB PHASE II ORIENTATION from 03/09/2017 in [place]  Date  03/09/17  Referring Provider  [name]      Visit Diagnosis: Status post coronary artery stent placement  Dyspnea and respiratory abnormalities  Patient's Home Medications on Admission:  Current Outpatient Medications:  .  acetaminophen (TYLENOL) 325 MG tablet, Take 650 mg by mouth every 4 (four) hours as needed for mild pain. , Disp: , Rfl:  .  aspirin 81 MG EC tablet, Take 81 mg by mouth at bedtime. , Disp: , Rfl:  .  clopidogrel (PLAVIX) 75 MG tablet, Take 1 tablet (75 mg total) by mouth daily., Disp: 90 tablet, Rfl: 3 .  glucose blood test strip, Check BS BID Dx: E11.9, Disp: 100 each, Rfl: 5 .  isosorbide mononitrate (IMDUR) 30 MG 24 hr tablet, Take 1 tablet (30 mg total) by mouth daily. (Patient not taking: Reported on 03/09/2017), Disp: 90 tablet, Rfl: 3 .  Lancets (ACCU-CHEK MULTICLIX) lancets, Check BS BID Dx: E11.9, Disp: 100 each, Rfl: 12 .  levothyroxine (SYNTHROID, LEVOTHROID) 100 MCG tablet, Take 1 tablet (100 mcg total) by mouth daily before breakfast., Disp: 90 tablet, Rfl: 3 .  metFORMIN (GLUCOPHAGE) 1000 MG tablet, Take 1 tablet (1,000 mg total) by mouth 2 (two) times daily with a meal., Disp: 180 tablet, Rfl: 3 .  Multiple Vitamin (MULTIVITAMIN WITH MINERALS) TABS tablet, Take 1 tablet by mouth daily., Disp: , Rfl:  .  predniSONE (DELTASONE) 5 MG tablet, Take 5 mg by mouth daily with breakfast. Take with #2 of the 1 mg tablets daily, Disp: , Rfl:  .  rosuvastatin (CRESTOR) 20 MG tablet, Take 1 tablet (20 mg total) at bedtime by mouth., Disp: 90 tablet, Rfl: 3 .   sitaGLIPtin (JANUVIA) 100 MG tablet, Take 1 tablet (100 mg total) by mouth daily., Disp: 90 tablet, Rfl: 1 .  vitamin B-12 (CYANOCOBALAMIN) 1000 MCG tablet, Take 1,000 mcg by mouth daily., Disp: , Rfl:   Past Medical History: Past Medical History:  Diagnosis Date  . Anxiety   . Arthritis    "my back is loaded w/it" (01/06/2017)  . CAD (coronary artery disease)    80% ostial lesion in L circumflex (2017)  . Chronic lower back pain   . GERD (gastroesophageal reflux disease)   . Hiatal hernia   . High cholesterol   . Hypothyroidism   . IBS (irritable bowel syndrome)   . Numbness and tingling of right lower extremity    "since 11/30/2016" (01/06/2017)  . PMR (polymyalgia rheumatica) (HCC)   . Type 2 diabetes mellitus (HCC)     Tobacco Use: Social History   Tobacco Use  Smoking Status Never Smoker  Smokeless Tobacco Never Used    Labs: Recent Review Flowsheet Data    Labs for ITP Cardiac and Pulmonary Rehab Latest Ref Rng & Units 11/30/2015 07/07/2016 07/07/2016 07/07/2016 08/18/2016   Cholestrol 125 - 200 mg/dL 140 - - - -   LDLCALC <130 mg/dL 57 - - - -   HDL >=40 mg/dL 41 - - - -   Trlycerides <150 mg/dL 210(H) - - - -   Hemoglobin A1c <5.7 % 7.3(H) - - -  6.4(H)   PHART 7.350 - 7.450 - 7.382 - - -   PCO2ART 32.0 - 48.0 mmHg - 40.7 - - -   HCO3 20.0 - 28.0 mmol/L - 24.2 21.2 24.1 -   TCO2 0 - 100 mmol/L - 25 22 25  -   ACIDBASEDEF 0.0 - 2.0 mmol/L - 1.0 5.0(H) 2.0 -   O2SAT % - 95.0 68.0 69.0 -      Capillary Blood Glucose: Lab Results  Component Value Date   GLUCAP 167 (H) 01/07/2017   GLUCAP 178 (H) 01/06/2017   GLUCAP 249 (H) 01/06/2017   GLUCAP 250 (H) 01/06/2017   GLUCAP 197 (H) 01/06/2017     Exercise Target Goals:    Exercise Program Goal: Individual exercise prescription set with THRR, safety & activity barriers. Participant demonstrates ability to understand and report RPE using BORG scale, to self-measure pulse accurately, and to acknowledge the importance of  the exercise prescription.  Exercise Prescription Goal: Starting with aerobic activity 30 plus minutes a day, 3 days per week for initial exercise prescription. Provide home exercise prescription and guidelines that participant acknowledges understanding prior to discharge.  Activity Barriers & Risk Stratification: Activity Barriers & Cardiac Risk Stratification - 03/09/17 1401      Activity Barriers & Cardiac Risk Stratification   Activity Barriers  Joint Problems;Shortness of Breath    Cardiac Risk Stratification  Moderate Moderate to High       6 Minute Walk: 6 Minute Walk    Row Name 03/09/17 1400         6 Minute Walk   Phase  Initial     Distance  1250 feet     Distance % Change  0 %     Distance Feet Change  0 ft     Walk Time  6 minutes     # of Rest Breaks  0     MPH  2.36     METS  2.81     RPE  9     Perceived Dyspnea   9     VO2 Peak  9.28     Symptoms  No     Resting HR  81 bpm     Resting BP  102/62     Resting Oxygen Saturation   96 %     Exercise Oxygen Saturation  during 6 min walk  96 %     Max Ex. HR  88 bpm     Max Ex. BP  116/64     2 Minute Post BP  104/62        Oxygen Initial Assessment:   Oxygen Re-Evaluation:   Oxygen Discharge (Final Oxygen Re-Evaluation):   Initial Exercise Prescription: Initial Exercise Prescription - 03/09/17 1400      Date of Initial Exercise RX and Referring Provider   Date  03/09/17    Referring Provider  [name]      Treadmill   MPH  2    Grade  0    Minutes  15    METs  2.53      Recumbant Elliptical   Level  1    RPM  79    Watts  46    Minutes  1.7    METs  3.4      Prescription Details   Frequency (times per week)  3    Duration  Progress to 30 minutes of continuous aerobic without signs/symptoms of physical distress  Intensity   THRR 40-80% of Max Heartrate  [id number]    Ratings of Perceived Exertion  11-13    Perceived Dyspnea  0-4      Progression   Progression   Continue progressive overload as per policy without signs/symptoms or physical distress.      Resistance Training   Training Prescription  Yes    Weight  1    Reps  10-15       Perform Capillary Blood Glucose checks as needed.  Exercise Prescription Changes:  Exercise Prescription Changes    Row Name 03/23/17 0700 03/23/17 1500 04/11/17 1500 04/25/17 0700       Response to Exercise   Blood Pressure (Admit)  -  100/58  100/62  100/62    Blood Pressure (Exercise)  -  100/60  120/68  118/60    Blood Pressure (Exit)  -  100/60  106/60  98/56    Heart Rate (Admit)  -  86 bpm  84 bpm  79 bpm    Heart Rate (Exercise)  -  106 bpm  109 bpm  112 bpm    Heart Rate (Exit)  -  86 bpm  94 bpm  90 bpm    Rating of Perceived Exertion (Exercise)  -  11  11  12     Duration  -  Progress to 30 minutes of  aerobic without signs/symptoms of physical distress  Progress to 30 minutes of  aerobic without signs/symptoms of physical distress  Progress to 30 minutes of  aerobic without signs/symptoms of physical distress    Intensity  -  THRR New 112-124-137  THRR New 110-124-137  THRR New 107-122-136      Progression   Progression  -  Continue to progress workloads to maintain intensity without signs/symptoms of physical distress.  Continue to progress workloads to maintain intensity without signs/symptoms of physical distress.  Continue to progress workloads to maintain intensity without signs/symptoms of physical distress.      Resistance Training   Training Prescription  Yes  Yes  Yes  Yes    Weight  1  2  2  2     Reps  10-15  10-15  10-15  10-15      Treadmill   MPH  2  2.2  2.7  2.8    Grade  0  0  0  0    Minutes  15  15  15  15     METs  2.53  2.6  3.1  3.1      Recumbant Elliptical   Level  1  2  2  2     RPM  79  61  63  60    Watts  46  79  80  77    Minutes  1.7  20  20  20     METs  3.4  4  4.2  4.2      Home Exercise Plan   Plans to continue exercise at  Home (comment)  Home  (comment)  Home (comment)  Home (comment)    Frequency  Add 2 additional days to program exercise sessions.  Add 2 additional days to program exercise sessions.  Add 2 additional days to program exercise sessions.  Add 2 additional days to program exercise sessions.    Initial Home Exercises Provided  03/22/17  03/22/17  03/22/17  03/22/17       Exercise Comments:  Exercise Comments    Row Name 03/13/17 1438  03/23/17 0736 03/23/17 1522 04/25/17 0801     Exercise Comments  Patient has not yet started CR and will be progressed in time.   Patient received the take home exercise plan this day 03/22/2017. THR was addressed as were safe ways of staying active when not in CR. Patient demonstrated an understanding and was encouraged to ask any questions in the future.   Patient is doing well in CR. He is getting back to where he was when he left PR. 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  Patient is doing well in CR. He has increased his speed on the treadmill and is maintaining his levels and watts on the elliptical. He is still veru active outside of CR, working on a farm. He state sthat this program is helping him.       Exercise Goals and Review:  Exercise Goals    Row Name 03/09/17 1404             Exercise Goals   Increase Physical Activity  Yes       Intervention  Provide advice, education, support and counseling about physical activity/exercise needs.;Develop an individualized exercise prescription for aerobic and resistive training based on initial evaluation findings, risk stratification, comorbidities and participant's personal goals.       Expected Outcomes  Achievement of increased cardiorespiratory fitness and enhanced flexibility, muscular endurance and strength shown through measurements of functional capacity and personal statement of participant.       Increase Strength and Stamina  Yes       Intervention   Provide advice, education, support and counseling about physical activity/exercise needs.;Develop an individualized exercise prescription for aerobic and resistive training based on initial evaluation findings, risk stratification, comorbidities and participant's personal goals.       Expected Outcomes  Achievement of increased cardiorespiratory fitness and enhanced flexibility, muscular endurance and strength shown through measurements of functional capacity and personal statement of participant.       Able to understand and use rate of perceived exertion (RPE) scale  Yes       Intervention  Provide education and explanation on how to use RPE scale       Expected Outcomes  Short Term: Able to use RPE daily in rehab to express subjective intensity level;Long Term:  Able to use RPE to guide intensity level when exercising independently       Able to understand and use Dyspnea scale  Yes       Intervention  Provide education and explanation on how to use Dyspnea scale       Expected Outcomes  Short Term: Able to use Dyspnea scale daily in rehab to express subjective sense of shortness of breath during exertion;Long Term: Able to use Dyspnea scale to guide intensity level when exercising independently       Knowledge and understanding of Target Heart Rate Range (THRR)  Yes       Intervention  Provide education and explanation of THRR including how the numbers were predicted and where they are located for reference       Expected Outcomes  Short Term: Able to state/look up THRR;Short Term: Able to use daily as guideline for intensity in rehab;Long Term: Able to use THRR to govern intensity when exercising independently       Able to check pulse independently  Yes       Intervention  Provide  education and demonstration on how to check pulse in carotid and radial arteries.;Review the importance of being able to check your own pulse for safety during independent exercise       Expected Outcomes  Short Term:  Able to explain why pulse checking is important during independent exercise;Long Term: Able to check pulse independently and accurately       Understanding of Exercise Prescription  Yes       Intervention  Provide education, explanation, and written materials on patient's individual exercise prescription       Expected Outcomes  Short Term: Able to explain program exercise prescription;Long Term: Able to explain home exercise prescription to exercise independently          Exercise Goals Re-Evaluation : Exercise Goals Re-Evaluation    Row Name 03/23/17 1521 04/25/17 0759           Exercise Goal Re-Evaluation   Exercise Goals Review  Increase Physical Activity;Increase Strength and Stamina;Knowledge and understanding of Target Heart Rate Range (THRR)  Increase Strength and Stamina;Increase Physical Activity;Knowledge and understanding of Target Heart Rate Range (THRR)      Comments  Patient is doing well in CR. He is getting back to where he was when he left PR. 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   Patient is doing well in CR. He has increased his speed on the treadmill and is maintaining his levels and watts on the elliptical. He is still veru active outside of CR, working on a farm. He state sthat this program is helping him.       Expected Outcomes  Patient wishes to breathe easier and to lose weight.   Patient wishes to breathe better and to lose weight over time.           Discharge Exercise Prescription (Final Exercise Prescription Changes): Exercise Prescription Changes - 04/25/17 0700      Response to Exercise   Blood Pressure (Admit)  100/62    Blood Pressure (Exercise)  118/60    Blood Pressure (Exit)  98/56    Heart Rate (Admit)  79 bpm    Heart Rate (Exercise)  112 bpm    Heart Rate (Exit)  90 bpm    Rating of Perceived Exertion (Exercise)  12    Duration  Progress to 30 minutes of  aerobic  without signs/symptoms of physical distress    Intensity  THRR New [id number]      Progression   Progression  Continue to progress workloads to maintain intensity without signs/symptoms of physical distress.      Resistance Training   Training Prescription  Yes    Weight  2    Reps  10-15      Treadmill   MPH  2.8    Grade  0    Minutes  15    METs  3.1      Recumbant Elliptical   Level  2    RPM  60    Watts  77    Minutes  20    METs  4.2      Home Exercise Plan   Plans to continue exercise at  Home (comment)    Frequency  Add 2 additional days to program exercise sessions.    Initial Home Exercises Provided  03/22/17       Nutrition:  Target Goals: Understanding of nutrition guidelines, daily intake of  sodium 1500mg , cholesterol 200mg , calories 30% from fat and 7% or less from saturated fats, daily to have 5 or more servings of fruits and vegetables.  Biometrics: Pre Biometrics - 03/09/17 1404      Pre Biometrics   Height  5\' 9"  (1.753 m)    Weight  184 lb 1.6 oz (83.5 kg)    Waist Circumference  37 inches    Hip Circumference  39 inches    Waist to Hip Ratio  0.95 %    BMI (Calculated)  27.17    Triceps Skinfold  7 mm    % Body Fat  22.8 %    Grip Strength  72.27 kg    Flexibility  10.67 in    Single Leg Stand  7 seconds        Nutrition Therapy Plan and Nutrition Goals: Nutrition Therapy & Goals - 04/26/17 1341      Nutrition Therapy   RD appointment defered  Yes      Personal Nutrition Goals   Personal Goal #2  Continue to eat a heart healthy diet.     Additional Goals?  No    Comments  Patient says he is continuing to eat a heart healthy diebatic diet.        Nutrition Discharge: Rate Your Plate Scores: Nutrition Assessments - 03/09/17 1438      MEDFICTS Scores   Pre Score  41       Nutrition Goals Re-Evaluation: Nutrition Goals Re-Evaluation    Row Name 03/29/17 1401             Goals   Current Weight  188 lb 3.2 oz (85.4  kg)       Comment  Patient continues to try to eat heart healthy. He has stopped eating sweets. He is on prednisone for arthritis which he attributes his weight gain to. He hopes to stop medication soon.        Expected Outcome  Patient will continue to work toward meeting his nurtition goal.          Personal Goal #2 Re-Evaluation   Personal Goal #2  Continue to eat a heart healthy diet.           Nutrition Goals Discharge (Final Nutrition Goals Re-Evaluation): Nutrition Goals Re-Evaluation - 03/29/17 1401      Goals   Current Weight  188 lb 3.2 oz (85.4 kg)    Comment  Patient continues to try to eat heart healthy. He has stopped eating sweets. He is on prednisone for arthritis which he attributes his weight gain to. He hopes to stop medication soon.     Expected Outcome  Patient will continue to work toward meeting his nurtition goal.       Personal Goal #2 Re-Evaluation   Personal Goal #2  Continue to eat a heart healthy diet.        Psychosocial: Target Goals: Acknowledge presence or absence of significant depression and/or stress, maximize coping skills, provide positive support system. Participant is able to verbalize types and ability to use techniques and skills needed for reducing stress and depression.  Initial Review & Psychosocial Screening: Initial Psych Review & Screening - 03/09/17 1444      Initial Review   Current issues with  None Identified      Family Dynamics   Good Support System?  Yes      Barriers   Psychosocial barriers to participate in program  There are no identifiable barriers or  psychosocial needs.      Screening Interventions   Interventions  Encouraged to exercise       Quality of Life Scores: Quality of Life - 03/09/17 1405      Quality of Life Scores   Health/Function Pre  23.2 %    Socioeconomic Pre  27.56 %    Psych/Spiritual Pre  23.86 %    Family Pre  25.13 %    GLOBAL Pre  24.59 %       PHQ-9: Recent Review Flowsheet Data     Depression screen [facility] 2/9 03/09/2017 09/06/2016 08/18/2016 01/18/2016   Decreased Interest 0 1 0 0   Down, Depressed, Hopeless 0 1 0 0   PHQ - 2 Score 0 2 0 0   Altered sleeping 2 3 0 -   Tired, decreased energy 2 3 0 -   Change in appetite 1 2 0 -   Feeling bad or failure about yourself  0 0 0 -   Trouble concentrating 1 1 0 -   Moving slowly or fidgety/restless 1 1 0 -   Suicidal thoughts 0 0 0 -   PHQ-9 Score 7 12 0 -   Difficult doing work/chores Not difficult at all Somewhat difficult Not difficult at all -     Interpretation of Total Score  Total Score Depression Severity:  1-4 = Minimal depression, 5-9 = Mild depression, 10-14 = Moderate depression, 15-19 = Moderately severe depression, 20-27 = Severe depression   Psychosocial Evaluation and Intervention: Psychosocial Evaluation - 03/09/17 1445      Psychosocial Evaluation & Interventions   Interventions  Encouraged to exercise with the program and follow exercise prescription    Continue Psychosocial Services   No Follow up required Patient is in a better place this time. QOL scores are 24. 59.       Psychosocial Re-Evaluation: Psychosocial Re-Evaluation    Row Name 03/29/17 1403 04/26/17 1345           Psychosocial Re-Evaluation   Current issues with  Current Depression  None Identified      Comments  Patient is not interested in treatment.   -      Expected Outcomes  Patient's QOL score was 24.59 and his PHQ-9 score was 7.  Patient will have no psychosocial barriers identified at discharge.       Interventions  Encouraged to attend Pulmonary Rehabilitation for the exercise;Stress management education;Relaxation education  -      Continue Psychosocial Services   Follow up required by staff  No Follow up required         Psychosocial Discharge (Final Psychosocial Re-Evaluation): Psychosocial Re-Evaluation - 04/26/17 1345      Psychosocial Re-Evaluation   Current issues with  None Identified    Expected  Outcomes  Patient will have no psychosocial barriers identified at discharge.     Continue Psychosocial Services   No Follow up required       Vocational Rehabilitation: Provide vocational rehab assistance to qualifying candidates.   Vocational Rehab Evaluation & Intervention: Vocational Rehab - 03/09/17 1415      Initial Vocational Rehab Evaluation & Intervention   Assessment shows need for Vocational Rehabilitation  No       Education: Education Goals: Education classes will be provided on a weekly basis, covering required topics. Participant will state understanding/return demonstration of topics presented.  Learning Barriers/Preferences: Learning Barriers/Preferences - 03/09/17 1414      Learning Barriers/Preferences   Learning Barriers  None    Learning Preferences  Skilled Demonstration;Verbal Instruction;Individual Instruction;Group Instruction       Education Topics: Hypertension, Hypertension Reduction -Define heart disease and high blood pressure. Discus how high blood pressure affects the body and ways to reduce high blood pressure.   CARDIAC REHAB PHASE II EXERCISE from 04/19/2017 in [place]  Date  04/05/17  Educator  [place]  Instruction Review Code  2- Demonstrated Understanding      Exercise and Your Heart -Discuss why it is important to exercise, the FITT principles of exercise, normal and abnormal responses to exercise, and how to exercise safely.   CARDIAC REHAB PHASE II EXERCISE from 04/19/2017 in [place]  Date  04/12/17  Educator  DC  Instruction Review Code  2- Demonstrated Understanding      Angina -Discuss definition of angina, causes of angina, treatment of angina, and how to decrease risk of having angina.   CARDIAC REHAB PHASE II EXERCISE from 04/19/2017 in [place]  Date  04/19/17  Educator  D. Coad  Instruction Review Code  2- Demonstrated Understanding       Cardiac Medications -Review what the following cardiac medications are used for, how they affect the body, and side effects that may occur when taking the medications.  Medications include Aspirin, Beta blockers, calcium channel blockers, ACE Inhibitors, angiotensin receptor blockers, diuretics, digoxin, and antihyperlipidemics.   Congestive Heart Failure -Discuss the definition of CHF, how to live with CHF, the signs and symptoms of CHF, and how keep track of weight and sodium intake.   Heart Disease and Intimacy -Discus the effect sexual activity has on the heart, how changes occur during intimacy as we age, and safety during sexual activity.   Smoking Cessation / COPD -Discuss different methods to quit smoking, the health benefits of quitting smoking, and the definition of COPD.   Nutrition I: Fats -Discuss the types of cholesterol, what cholesterol does to the heart, and how cholesterol levels can be controlled.   Nutrition II: Labels -Discuss the different components of food labels and how to read food label   PULMONARY REHAB OTHER RESPIRATORY from 12/29/2016 in [place]  Date  11/24/16  Educator  [place]  Instruction Review Code  2- meets goals/outcomes      Heart Parts and Heart Disease -Discuss the anatomy of the heart, the pathway of blood circulation through the heart, and these are affected by heart disease.   Stress I: Signs and Symptoms -Discuss the causes of stress, how stress may lead to anxiety and depression, and ways to limit stress.   PULMONARY REHAB OTHER RESPIRATORY from 12/29/2016 in [place]  Date  12/08/16  Educator  [place]  Instruction Review Code  2- meets goals/outcomes      Stress II: Relaxation -Discuss different types of relaxation techniques to limit stress.   CARDIAC REHAB PHASE II EXERCISE from 04/19/2017 in [place]  Date  03/15/17  Educator  DJ  Instruction Review Code   2- Demonstrated Understanding      Warning Signs of Stroke / TIA -Discuss definition of a stroke, what the signs and symptoms are of a stroke, and how to identify when someone is having stroke.   CARDIAC REHAB PHASE II EXERCISE from 04/19/2017 in [place]  Date  03/22/17  Educator  DC  Instruction Review Code  2- Demonstrated Understanding      Knowledge Questionnaire Score: Knowledge Questionnaire  Score - 03/09/17 1415      Knowledge Questionnaire Score   Pre Score  19/24       Core Components/Risk Factors/Patient Goals at Admission: Personal Goals and Risk Factors at Admission - 03/09/17 1441      Core Components/Risk Factors/Patient Goals on Admission    Weight Management  Yes    Admit Weight  184 lb 1.6 oz (83.5 kg)    Goal Weight: Short Term  179 lb 1.6 oz (81.2 kg)    Goal Weight: Long Term  174 lb 1.6 oz (79 kg)    Expected Outcomes  Short Term: Continue to assess and modify interventions until short term weight is achieved;Long Term: Adherence to nutrition and physical activity/exercise program aimed toward attainment of established weight goal    Personal Goal Other  Yes    Personal Goal  Breathe better, lose 10 lbs    Intervention  Attend CR 3 x week and supplement with home exercise 2 x week.    Expected Outcomes  Achieve personal goals.        Core Components/Risk Factors/Patient Goals Review:  Goals and Risk Factor Review    Row Name 03/29/17 1357 04/26/17 1336           Core Components/Risk Factors/Patient Goals Review   Personal Goals Review  Weight Management/Obesity;Improve shortness of breath with ADL's Lose weight; get stronger; continue to breathe better.   Weight Management/Obesity;Improve shortness of breath with ADL's;Diabetes Lose weight; get stronger; continue to breathe better.       Review  Patient has completed 8 sessions gaining 4 lbs. He has cut down on the amount of sweets he is eating. He blames his weight gain on  taking prednisone for his arthritis. He hopes to get off of medication soon. He says his appetite has improved so he is eating more. He says he feels better and is getting some stronger. He is able to do more around the house. Will continue to monitor.   Patient has completed 18 sessions maintaining his weight since last 30 day review. He is still on Prednisone with a tappering dosage. He is down to 4 mg daily. He believes the Prednisone is impeding his weight loss. His last A1C was 6.4 08/18/16. He is doing well in the program with progression. He says his SOB continues to improve with activity. He says he has more energy. 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 He says the program is helping his overall. Will continue to monitor.       Expected Outcomes  Patient will complete the program and continue to work toward meeting his personal goals.   Patient will complete the program and continue to meet his personal goals.          Core Components/Risk Factors/Patient Goals at Discharge (Final Review):  Goals and Risk Factor Review - 04/26/17 1336      Core Components/Risk Factors/Patient Goals Review   Personal Goals Review  Weight Management/Obesity;Improve shortness of breath with ADL's;Diabetes Lose weight; get stronger; continue to breathe better.     Review  Patient has completed 18 sessions maintaining his weight since last 30 day review. He is still on Prednisone with a tappering dosage. He is down to 4 mg daily. He believes the Prednisone is impeding his weight loss. His last A1C was 6.4 08/18/16. He is doing well in the program with progression. He says his SOB continues to improve  with activity. He says he has more energy. 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 He says the program is helping his overall. Will continue to monitor.     Expected Outcomes  Patient will complete the program and continue to meet his  personal goals.        ITP Comments: ITP Comments    Row Name 03/09/17 1416           ITP Comments  Mr. Gladish is coming back to the program after having stent placement. He finished our pulmonary program 6 months ago.           Comments: ITP 30 Day VIEW Patient doing well in the program. Will continue to monitor for progress.

## 2017-04-28 ENCOUNTER — Encounter (HOSPITAL_COMMUNITY)
Admission: RE | Admit: 2017-04-28 | Discharge: 2017-04-28 | Disposition: A | Discharge status: Home / Self Care | Payer: Medicare Other | Source: Ambulatory Visit | Attending: Internal Medicine | Admitting: Internal Medicine

## 2017-04-28 DIAGNOSIS — Z955 Presence of coronary angioplasty implant and graft: Secondary | ICD-10-CM

## 2017-04-28 DIAGNOSIS — R0600 Dyspnea, unspecified: Secondary | ICD-10-CM

## 2017-04-28 DIAGNOSIS — R0689 Other abnormalities of breathing: Secondary | ICD-10-CM

## 2017-04-28 NOTE — Progress Notes (Signed)
Daily Session Note  Patient Details  Name: Pranit Owensby MRN: [id number] Date of Birth: [birth year] Referring Provider:     CARDIAC REHAB PHASE II ORIENTATION from 03/09/2017 in [place]  Referring Provider  [name]      Encounter Date: 04/28/2017  Check In: Session Check In - 04/28/17 0930      Check-In   Location  [facility]    Staff Present  Aundra Dubin, RN, BSN;Gregory Luther Parody, BS, EP, Exercise Physiologist    Supervising physician immediately available to respond to emergencies  See telemetry face sheet for immediately available MD    Medication changes reported      No    Fall or balance concerns reported     No    Warm-up and Cool-down  Performed as group-led instruction    Resistance Training Performed  Yes    VAD Patient?  No      Pain Assessment   Currently in Pain?  No/denies    Pain Score  0-No pain    Multiple Pain Sites  No       Capillary Blood Glucose: No results found for this or any previous visit (from the past 24 hour(s)).    Social History   Tobacco Use  Smoking Status Never Smoker  Smokeless Tobacco Never Used    Goals Met:  Independence with exercise equipment Exercise tolerated well No report of cardiac concerns or symptoms Strength training completed today  Goals Unmet:  Not Applicable  Comments: Check out 1030.   [name] is Medical Director for [facility] Cardiac and Pulmonary Rehab.

## 2017-05-01 ENCOUNTER — Encounter (HOSPITAL_COMMUNITY): Payer: Medicare Other

## 2017-05-03 ENCOUNTER — Encounter (HOSPITAL_COMMUNITY)
Admission: RE | Admit: 2017-05-03 | Discharge: 2017-05-03 | Disposition: A | Discharge status: Home / Self Care | Payer: Medicare Other | Source: Ambulatory Visit | Attending: Internal Medicine | Admitting: Internal Medicine

## 2017-05-03 DIAGNOSIS — R0689 Other abnormalities of breathing: Secondary | ICD-10-CM

## 2017-05-03 DIAGNOSIS — F419 Anxiety disorder, unspecified: Secondary | ICD-10-CM | POA: Insufficient documentation

## 2017-05-03 DIAGNOSIS — K589 Irritable bowel syndrome without diarrhea: Secondary | ICD-10-CM | POA: Diagnosis not present

## 2017-05-03 DIAGNOSIS — Z955 Presence of coronary angioplasty implant and graft: Secondary | ICD-10-CM | POA: Diagnosis not present

## 2017-05-03 DIAGNOSIS — E119 Type 2 diabetes mellitus without complications: Secondary | ICD-10-CM | POA: Insufficient documentation

## 2017-05-03 DIAGNOSIS — Z7982 Long term (current) use of aspirin: Secondary | ICD-10-CM | POA: Diagnosis not present

## 2017-05-03 DIAGNOSIS — E7800 Pure hypercholesterolemia, unspecified: Secondary | ICD-10-CM | POA: Insufficient documentation

## 2017-05-03 DIAGNOSIS — Z7952 Long term (current) use of systemic steroids: Secondary | ICD-10-CM | POA: Diagnosis not present

## 2017-05-03 DIAGNOSIS — K219 Gastro-esophageal reflux disease without esophagitis: Secondary | ICD-10-CM | POA: Diagnosis not present

## 2017-05-03 DIAGNOSIS — Z7902 Long term (current) use of antithrombotics/antiplatelets: Secondary | ICD-10-CM | POA: Diagnosis not present

## 2017-05-03 DIAGNOSIS — Z79899 Other long term (current) drug therapy: Secondary | ICD-10-CM | POA: Diagnosis not present

## 2017-05-03 DIAGNOSIS — M353 Polymyalgia rheumatica: Secondary | ICD-10-CM | POA: Insufficient documentation

## 2017-05-03 DIAGNOSIS — E039 Hypothyroidism, unspecified: Secondary | ICD-10-CM | POA: Diagnosis not present

## 2017-05-03 DIAGNOSIS — I2510 Atherosclerotic heart disease of native coronary artery without angina pectoris: Secondary | ICD-10-CM | POA: Diagnosis not present

## 2017-05-03 DIAGNOSIS — Z7984 Long term (current) use of oral hypoglycemic drugs: Secondary | ICD-10-CM | POA: Diagnosis not present

## 2017-05-03 DIAGNOSIS — R0600 Dyspnea, unspecified: Secondary | ICD-10-CM

## 2017-05-03 NOTE — Progress Notes (Signed)
Daily Session Note  Patient Details  Name: Caleb Butler MRN: [id number] Date of Birth: 08-Jul-1945 Referring Provider:     CARDIAC REHAB PHASE II ORIENTATION from 03/09/2017 in [place]  Referring Provider  [name]      Encounter Date: 05/03/2017  Check In: Session Check In - 05/03/17 0930      Check-In   Location  [facility]    Staff Present  Aundra Dubin, RN, BSN;Gregory Luther Parody, BS, EP, Exercise Physiologist    Supervising physician immediately available to respond to emergencies  See telemetry face sheet for immediately available MD    Medication changes reported      No    Fall or balance concerns reported     No    Warm-up and Cool-down  Performed as group-led instruction    Resistance Training Performed  Yes    VAD Patient?  No      Pain Assessment   Currently in Pain?  No/denies    Pain Score  0-No pain    Multiple Pain Sites  No       Capillary Blood Glucose: No results found for this or any previous visit (from the past 24 hour(s)).    Social History   Tobacco Use  Smoking Status Never Smoker  Smokeless Tobacco Never Used    Goals Met:  Independence with exercise equipment Exercise tolerated well No report of cardiac concerns or symptoms Strength training completed today  Goals Unmet:  Not Applicable  Comments: Check out 1030.   [name] is Medical Director for [facility] Cardiac and Pulmonary Rehab.

## 2017-05-05 ENCOUNTER — Encounter (HOSPITAL_COMMUNITY)
Admission: RE | Admit: 2017-05-05 | Discharge: 2017-05-05 | Disposition: A | Discharge status: Home / Self Care | Payer: Medicare Other | Source: Ambulatory Visit | Attending: Internal Medicine | Admitting: Internal Medicine

## 2017-05-05 DIAGNOSIS — Z955 Presence of coronary angioplasty implant and graft: Secondary | ICD-10-CM | POA: Diagnosis not present

## 2017-05-05 DIAGNOSIS — R0600 Dyspnea, unspecified: Secondary | ICD-10-CM

## 2017-05-05 DIAGNOSIS — R0689 Other abnormalities of breathing: Secondary | ICD-10-CM

## 2017-05-05 NOTE — Progress Notes (Signed)
Daily Session Note  Patient Details  Name: Gabor Lusk MRN: [id number] Date of Birth: Jan 07, 1946 Referring Provider:     CARDIAC REHAB PHASE II ORIENTATION from 03/09/2017 in [place]  Referring Provider  [name]      Encounter Date: 05/05/2017  Check In: Session Check In - 05/05/17 0930      Check-In   Location  [facility]    Staff Present  Aundra Dubin, RN, BSN;Diane Coad, MS, EP, [facility], Exercise Physiologist    Supervising physician immediately available to respond to emergencies  See telemetry face sheet for immediately available MD    Medication changes reported      No    Fall or balance concerns reported     No    Warm-up and Cool-down  Performed as group-led instruction    Resistance Training Performed  Yes    VAD Patient?  No      Pain Assessment   Currently in Pain?  No/denies    Pain Score  0-No pain    Multiple Pain Sites  No       Capillary Blood Glucose: No results found for this or any previous visit (from the past 24 hour(s)).    Social History   Tobacco Use  Smoking Status Never Smoker  Smokeless Tobacco Never Used    Goals Met:  Independence with exercise equipment Exercise tolerated well No report of cardiac concerns or symptoms Strength training completed today  Goals Unmet:  Not Applicable  Comments: Check out 1030.   [name] is Medical Director for [facility] Cardiac and Pulmonary Rehab.

## 2017-05-08 ENCOUNTER — Encounter (HOSPITAL_COMMUNITY): Payer: Medicare Other

## 2017-05-10 ENCOUNTER — Encounter (HOSPITAL_COMMUNITY): Payer: Medicare Other

## 2017-05-12 ENCOUNTER — Encounter (HOSPITAL_COMMUNITY)
Admission: RE | Admit: 2017-05-12 | Discharge: 2017-05-12 | Disposition: A | Discharge status: Home / Self Care | Payer: Medicare Other | Source: Ambulatory Visit | Attending: Internal Medicine | Admitting: Internal Medicine

## 2017-05-12 DIAGNOSIS — Z955 Presence of coronary angioplasty implant and graft: Secondary | ICD-10-CM | POA: Diagnosis not present

## 2017-05-12 NOTE — Progress Notes (Signed)
Daily Session Note  Patient Details  Name: Caleb Butler MRN: [id number] Date of Birth: Oct 16, 1945 Referring Provider:     CARDIAC REHAB PHASE II ORIENTATION from 03/09/2017 in [place]  Referring Provider  [name]      Encounter Date: 05/12/2017  Check In: Session Check In - 05/12/17 1017      Check-In   Location  [facility]    Staff Present  Kaiven Vester Angelina Pih, MS, EP, [facility], Exercise Physiologist;Debra Wynetta Emery, RN, BSN    Supervising physician immediately available to respond to emergencies  See telemetry face sheet for immediately available MD    Medication changes reported      No    Fall or balance concerns reported     No    Tobacco Cessation  No Change    Warm-up and Cool-down  Performed as group-led instruction    Resistance Training Performed  Yes    VAD Patient?  No      Pain Assessment   Currently in Pain?  No/denies    Pain Score  0-No pain    Multiple Pain Sites  No       Capillary Blood Glucose: No results found for this or any previous visit (from the past 24 hour(s)).    Social History   Tobacco Use  Smoking Status Never Smoker  Smokeless Tobacco Never Used    Goals Met:  Independence with exercise equipment Exercise tolerated well No report of cardiac concerns or symptoms Strength training completed today  Goals Unmet:  Not Applicable  Comments: Check out: 12noon    [name] is Medical Director for [facility] and Pulmonary Rehab.

## 2017-05-15 ENCOUNTER — Encounter (HOSPITAL_COMMUNITY)
Admission: RE | Admit: 2017-05-15 | Discharge: 2017-05-15 | Disposition: A | Discharge status: Home / Self Care | Payer: Medicare Other | Source: Ambulatory Visit | Attending: Internal Medicine | Admitting: Internal Medicine

## 2017-05-15 DIAGNOSIS — Z955 Presence of coronary angioplasty implant and graft: Secondary | ICD-10-CM | POA: Diagnosis not present

## 2017-05-15 DIAGNOSIS — R0600 Dyspnea, unspecified: Secondary | ICD-10-CM

## 2017-05-15 DIAGNOSIS — R0689 Other abnormalities of breathing: Secondary | ICD-10-CM

## 2017-05-15 NOTE — Progress Notes (Signed)
Daily Session Note  Patient Details  Name: Caleb Butler MRN: [id number] Date of Birth: [birth year] Referring Provider:     CARDIAC REHAB PHASE II ORIENTATION from 03/09/2017 in [place]  Referring Provider  Dr. Haroldine Butler      Encounter Date: 05/15/2017  Check In: Session Check In - 05/15/17 1007      Check-In   Location  [facility]    Staff Present  Suzanne Boron, BS, EP, Exercise Physiologist;Debra Wynetta Emery, RN, BSN    Supervising physician immediately available to respond to emergencies  See telemetry face sheet for immediately available MD    Medication changes reported      No    Fall or balance concerns reported     No    Warm-up and Cool-down  Performed as group-led instruction    Resistance Training Performed  Yes    VAD Patient?  No      Pain Assessment   Currently in Pain?  No/denies    Pain Score  0-No pain    Multiple Pain Sites  No       Capillary Blood Glucose: No results found for this or any previous visit (from the past 24 hour(s)).    Social History   Tobacco Use  Smoking Status Never Smoker  Smokeless Tobacco Never Used    Goals Met:  Independence with exercise equipment Exercise tolerated well No report of cardiac concerns or symptoms Strength training completed today  Goals Unmet:  Not Applicable  Comments: Check out 1030   Dr. Kate Butler is Medical Director for [name] and Pulmonary Rehab.

## 2017-05-15 NOTE — Progress Notes (Signed)
Cardiac Individual Treatment Plan  Patient Details  Name: Caleb Butler MRN: [id number] Date of Birth: 31-Oct-1945 Referring Provider:     CARDIAC REHAB PHASE II ORIENTATION from 03/09/2017 in [place]  Referring Provider  [name]      Initial Encounter Date:    CARDIAC REHAB PHASE II ORIENTATION from 03/09/2017 in [place]  Date  03/09/17  Referring Provider  [name]      Visit Diagnosis: Status post coronary artery stent placement  Dyspnea and respiratory abnormalities  Patient's Home Medications on Admission:  Current Outpatient Medications:  .  acetaminophen (TYLENOL) 325 MG tablet, Take 650 mg by mouth every 4 (four) hours as needed for mild pain. , Disp: , Rfl:  .  aspirin 81 MG EC tablet, Take 81 mg by mouth at bedtime. , Disp: , Rfl:  .  clopidogrel (PLAVIX) 75 MG tablet, Take 1 tablet (75 mg total) by mouth daily., Disp: 90 tablet, Rfl: 3 .  glucose blood test strip, Check BS BID Dx: E11.9, Disp: 100 each, Rfl: 5 .  isosorbide mononitrate (IMDUR) 30 MG 24 hr tablet, Take 1 tablet (30 mg total) by mouth daily. (Patient not taking: Reported on 03/09/2017), Disp: 90 tablet, Rfl: 3 .  Lancets (ACCU-CHEK MULTICLIX) lancets, Check BS BID Dx: E11.9, Disp: 100 each, Rfl: 12 .  levothyroxine (SYNTHROID, LEVOTHROID) 100 MCG tablet, Take 1 tablet (100 mcg total) by mouth daily before breakfast., Disp: 90 tablet, Rfl: 3 .  metFORMIN (GLUCOPHAGE) 1000 MG tablet, Take 1 tablet (1,000 mg total) by mouth 2 (two) times daily with a meal., Disp: 180 tablet, Rfl: 3 .  Multiple Vitamin (MULTIVITAMIN WITH MINERALS) TABS tablet, Take 1 tablet by mouth daily., Disp: , Rfl:  .  predniSONE (DELTASONE) 5 MG tablet, Take 5 mg by mouth daily with breakfast. Take with #2 of the 1 mg tablets daily, Disp: , Rfl:  .  rosuvastatin (CRESTOR) 20 MG tablet, Take 1 tablet (20 mg total) at bedtime by mouth., Disp: 90 tablet, Rfl: 3 .   sitaGLIPtin (JANUVIA) 100 MG tablet, Take 1 tablet (100 mg total) by mouth daily., Disp: 90 tablet, Rfl: 1 .  vitamin B-12 (CYANOCOBALAMIN) 1000 MCG tablet, Take 1,000 mcg by mouth daily., Disp: , Rfl:   Past Medical History: Past Medical History:  Diagnosis Date  . Anxiety   . Arthritis    "my back is loaded w/it" (01/06/2017)  . CAD (coronary artery disease)    80% ostial lesion in L circumflex (2017)  . Chronic lower back pain   . GERD (gastroesophageal reflux disease)   . Hiatal hernia   . High cholesterol   . Hypothyroidism   . IBS (irritable bowel syndrome)   . Numbness and tingling of right lower extremity    "since 11/30/2016" (01/06/2017)  . PMR (polymyalgia rheumatica) (HCC)   . Type 2 diabetes mellitus (HCC)     Tobacco Use: Social History   Tobacco Use  Smoking Status Never Smoker  Smokeless Tobacco Never Used    Labs: Recent Review Flowsheet Data    Labs for ITP Cardiac and Pulmonary Rehab Latest Ref Rng & Units 11/30/2015 07/07/2016 07/07/2016 07/07/2016 08/18/2016   Cholestrol 125 - 200 mg/dL 140 - - - -   LDLCALC <130 mg/dL 57 - - - -   HDL >=40 mg/dL 41 - - - -   Trlycerides <150 mg/dL 210(H) - - - -   Hemoglobin A1c <5.7 % 7.3(H) - - -  6.4(H)   PHART 7.350 - 7.450 - 7.382 - - -   PCO2ART 32.0 - 48.0 mmHg - 40.7 - - -   HCO3 20.0 - 28.0 mmol/L - 24.2 21.2 24.1 -   TCO2 0 - 100 mmol/L - 25 22 25  -   ACIDBASEDEF 0.0 - 2.0 mmol/L - 1.0 5.0(H) 2.0 -   O2SAT % - 95.0 68.0 69.0 -      Capillary Blood Glucose: Lab Results  Component Value Date   GLUCAP 167 (H) 01/07/2017   GLUCAP 178 (H) 01/06/2017   GLUCAP 249 (H) 01/06/2017   GLUCAP 250 (H) 01/06/2017   GLUCAP 197 (H) 01/06/2017     Exercise Target Goals:    Exercise Program Goal: Individual exercise prescription set with THRR, safety & activity barriers. Participant demonstrates ability to understand and report RPE using BORG scale, to self-measure pulse accurately, and to acknowledge the importance of  the exercise prescription.  Exercise Prescription Goal: Starting with aerobic activity 30 plus minutes a day, 3 days per week for initial exercise prescription. Provide home exercise prescription and guidelines that participant acknowledges understanding prior to discharge.  Activity Barriers & Risk Stratification: Activity Barriers & Cardiac Risk Stratification - 03/09/17 1401      Activity Barriers & Cardiac Risk Stratification   Activity Barriers  Joint Problems;Shortness of Breath    Cardiac Risk Stratification  Moderate Moderate to High       6 Minute Walk: 6 Minute Walk    Row Name 03/09/17 1400         6 Minute Walk   Phase  Initial     Distance  1250 feet     Distance % Change  0 %     Distance Feet Change  0 ft     Walk Time  6 minutes     # of Rest Breaks  0     MPH  2.36     METS  2.81     RPE  9     Perceived Dyspnea   9     VO2 Peak  9.28     Symptoms  No     Resting HR  81 bpm     Resting BP  102/62     Resting Oxygen Saturation   96 %     Exercise Oxygen Saturation  during 6 min walk  96 %     Max Ex. HR  88 bpm     Max Ex. BP  116/64     2 Minute Post BP  104/62        Oxygen Initial Assessment:   Oxygen Re-Evaluation:   Oxygen Discharge (Final Oxygen Re-Evaluation):   Initial Exercise Prescription: Initial Exercise Prescription - 03/09/17 1400      Date of Initial Exercise RX and Referring Provider   Date  03/09/17    Referring Provider  [name]      Treadmill   MPH  2    Grade  0    Minutes  15    METs  2.53      Recumbant Elliptical   Level  1    RPM  79    Watts  46    Minutes  1.7    METs  3.4      Prescription Details   Frequency (times per week)  3    Duration  Progress to 30 minutes of continuous aerobic without signs/symptoms of physical distress  Intensity   THRR 40-80% of Max Heartrate  [phone]    Ratings of Perceived Exertion  11-13    Perceived Dyspnea  0-4      Progression   Progression   Continue progressive overload as per policy without signs/symptoms or physical distress.      Resistance Training   Training Prescription  Yes    Weight  1    Reps  10-15       Perform Capillary Blood Glucose checks as needed.  Exercise Prescription Changes:  Exercise Prescription Changes    Row Name 03/23/17 0700 03/23/17 1500 04/11/17 1500 04/25/17 0700 05/16/17 0700     Response to Exercise   Blood Pressure (Admit)  -  100/58  100/62  100/62  112/64   Blood Pressure (Exercise)  -  100/60  120/68  118/60  118/66   Blood Pressure (Exit)  -  100/60  106/60  98/56  108/64   Heart Rate (Admit)  -  86 bpm  84 bpm  79 bpm  78 bpm   Heart Rate (Exercise)  -  106 bpm  109 bpm  112 bpm  103 bpm   Heart Rate (Exit)  -  86 bpm  94 bpm  90 bpm  85 bpm   Rating of Perceived Exertion (Exercise)  -  11  11  12  12    Duration  -  Progress to 30 minutes of  aerobic without signs/symptoms of physical distress  Progress to 30 minutes of  aerobic without signs/symptoms of physical distress  Progress to 30 minutes of  aerobic without signs/symptoms of physical distress  Progress to 30 minutes of  aerobic without signs/symptoms of physical distress   Intensity  -  THRR New [phone]  THRR New [id number]  THRR New [phone]  THRR New [id number]     Progression   Progression  -  Continue to progress workloads to maintain intensity without signs/symptoms of physical distress.  Continue to progress workloads to maintain intensity without signs/symptoms of physical distress.  Continue to progress workloads to maintain intensity without signs/symptoms of physical distress.  Continue to progress workloads to maintain intensity without signs/symptoms of physical distress.     Resistance Training   Training Prescription  Yes  Yes  Yes  Yes  Yes   Weight  1  2  2  2  5    Reps  10-15  10-15  10-15  10-15  10-15     Treadmill   MPH  2  2.2  2.7  2.8  3   Grade  0  0  0  0  0.05   Minutes  15  15  15  15   15    METs  2.53  2.6  3.1  3.1  3.4     Recumbant Elliptical   Level  1  2  2  2  3    RPM  79  61  63  60  63   Watts  46  79  80  77  90   Minutes  1.7  20  20  20  20    METs  3.4  4  4.2  4.2  5.2     Home Exercise Plan   Plans to continue exercise at  Home (comment)  Home (comment)  Home (comment)  Home (comment)  Home (comment)   Frequency  Add 2 additional days to program exercise sessions.  Add 2 additional days to program exercise sessions.  Add 2 additional days to program exercise sessions.  Add 2 additional days to program exercise sessions.  Add 2 additional days to program exercise sessions.   Initial Home Exercises Provided  03/22/17  03/22/17  03/22/17  03/22/17  03/22/17      Exercise Comments:  Exercise Comments    Row Name 03/13/17 1438 03/23/17 0736 03/23/17 1522 04/25/17 0801 05/16/17 0801   Exercise Comments  Patient has not yet started CR and will be progressed in time.   Patient received the take home exercise plan this day 03/22/2017. THR was addressed as were safe ways of staying active when not in CR. Patient demonstrated an understanding and was encouraged to ask any questions in the future.   Patient is doing well in CR. He is getting back to where he was when he left [facility]. 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  Patient is doing well in CR. He has increased his speed on the treadmill and is maintaining his levels and watts on the elliptical. He is still veru active outside of CR, working on a farm. He state sthat this program is helping him.  Patient is doing well in CR. He has progressed on both machines and has increased his SPMs on the elliptical and speed on the treadmill. 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      Exercise Goals and Review:  Exercise Goals    Row Name 03/09/17 1404             Exercise Goals    Increase Physical Activity  Yes       Intervention  Provide advice, education, support and counseling about physical activity/exercise needs.;Develop an individualized exercise prescription for aerobic and resistive training based on initial evaluation findings, risk stratification, comorbidities and participant's personal goals.       Expected Outcomes  Achievement of increased cardiorespiratory fitness and enhanced flexibility, muscular endurance and strength shown through measurements of functional capacity and personal statement of participant.       Increase Strength and Stamina  Yes       Intervention  Provide advice, education, support and counseling about physical activity/exercise needs.;Develop an individualized exercise prescription for aerobic and resistive training based on initial evaluation findings, risk stratification, comorbidities and participant's personal goals.       Expected Outcomes  Achievement of increased cardiorespiratory fitness and enhanced flexibility, muscular endurance and strength shown through measurements of functional capacity and personal statement of participant.       Able to understand and use rate of perceived exertion (RPE) scale  Yes       Intervention  Provide education and explanation on how to use RPE scale       Expected Outcomes  Short Term: Able to use RPE daily in rehab to express subjective intensity level;Long Term:  Able to use RPE to guide intensity level when exercising independently       Able to understand and use Dyspnea scale  Yes       Intervention  Provide education and explanation on how to use Dyspnea scale       Expected Outcomes  Short Term: Able to use Dyspnea scale daily in rehab to express subjective sense of shortness of breath during exertion;Long Term: Able to use Dyspnea scale to guide intensity level when exercising  independently       Knowledge and understanding of Target Heart Rate Range (THRR)  Yes       Intervention   Provide education and explanation of THRR including how the numbers were predicted and where they are located for reference       Expected Outcomes  Short Term: Able to state/look up THRR;Short Term: Able to use daily as guideline for intensity in rehab;Long Term: Able to use THRR to govern intensity when exercising independently       Able to check pulse independently  Yes       Intervention  Provide education and demonstration on how to check pulse in carotid and radial arteries.;Review the importance of being able to check your own pulse for safety during independent exercise       Expected Outcomes  Short Term: Able to explain why pulse checking is important during independent exercise;Long Term: Able to check pulse independently and accurately       Understanding of Exercise Prescription  Yes       Intervention  Provide education, explanation, and written materials on patient's individual exercise prescription       Expected Outcomes  Short Term: Able to explain program exercise prescription;Long Term: Able to explain home exercise prescription to exercise independently          Exercise Goals Re-Evaluation : Exercise Goals Re-Evaluation    Row Name 03/23/17 1521 04/25/17 0759 05/16/17 0800         Exercise Goal Re-Evaluation   Exercise Goals Review  Increase Physical Activity;Increase Strength and Stamina;Knowledge and understanding of Target Heart Rate Range (THRR)  Increase Strength and Stamina;Increase Physical Activity;Knowledge and understanding of Target Heart Rate Range (THRR)  Increase Physical Activity;Increase Strength and Stamina;Knowledge and understanding of Target Heart Rate Range (THRR)     Comments  Patient is doing well in CR. He is getting back to where he was when he left [place]. 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   Patient is doing well in CR. He has increased his speed on the treadmill and is  maintaining his levels and watts on the elliptical. He is still veru active outside of CR, working on a farm. He state sthat this program is helping him.   Patient is doing well in CR. He has progressed on both machines and has increased his SPMs on the elliptical and speed on the treadmill. 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      Expected Outcomes  Patient wishes to breathe easier and to lose weight.   Patient wishes to breathe better and to lose weight over time.   Patient wishes to breathe easier and to lose weight.          Discharge Exercise Prescription (Final Exercise Prescription Changes): Exercise Prescription Changes - 05/16/17 0700      Response to Exercise   Blood Pressure (Admit)  112/64    Blood Pressure (Exercise)  118/66    Blood Pressure (Exit)  108/64    Heart Rate (Admit)  78 bpm    Heart Rate (Exercise)  103 bpm    Heart Rate (Exit)  85 bpm    Rating of Perceived Exertion (Exercise)  12    Duration  Progress to 30 minutes of  aerobic without signs/symptoms of physical distress  Intensity  THRR New [id number]      Progression   Progression  Continue to progress workloads to maintain intensity without signs/symptoms of physical distress.      Resistance Training   Training Prescription  Yes    Weight  5    Reps  10-15      Treadmill   MPH  3    Grade  0.05    Minutes  15    METs  3.4      Recumbant Elliptical   Level  3    RPM  63    Watts  90    Minutes  20    METs  5.2      Home Exercise Plan   Plans to continue exercise at  Home (comment)    Frequency  Add 2 additional days to program exercise sessions.    Initial Home Exercises Provided  03/22/17       Nutrition:  Target Goals: Understanding of nutrition guidelines, daily intake of sodium 1500mg , cholesterol 200mg , calories 30% from fat and 7% or less from saturated fats, daily to have 5 or more servings of fruits  and vegetables.  Biometrics: Pre Biometrics - 03/09/17 1404      Pre Biometrics   Height  5\' 9"  (1.753 m)    Weight  184 lb 1.6 oz (83.5 kg)    Waist Circumference  37 inches    Hip Circumference  39 inches    Waist to Hip Ratio  0.95 %    BMI (Calculated)  27.17    Triceps Skinfold  7 mm    % Body Fat  22.8 %    Grip Strength  72.27 kg    Flexibility  10.67 in    Single Leg Stand  7 seconds        Nutrition Therapy Plan and Nutrition Goals: Nutrition Therapy & Goals - 05/15/17 1400      Nutrition Therapy   RD appointment defered  Yes      Personal Nutrition Goals   Personal Goal #2  Continue to eat a heart healthy diet.     Additional Goals?  No    Comments  Patient says he is eating a heart healthy diet.        Nutrition Discharge: Rate Your Plate Scores: Nutrition Assessments - 03/09/17 1438      MEDFICTS Scores   Pre Score  41       Nutrition Goals Re-Evaluation: Nutrition Goals Re-Evaluation    Row Name 03/29/17 1401             Goals   Current Weight  188 lb 3.2 oz (85.4 kg)       Comment  Patient continues to try to eat heart healthy. He has stopped eating sweets. He is on prednisone for arthritis which he attributes his weight gain to. He hopes to stop medication soon.        Expected Outcome  Patient will continue to work toward meeting his nurtition goal.          Personal Goal #2 Re-Evaluation   Personal Goal #2  Continue to eat a heart healthy diet.           Nutrition Goals Discharge (Final Nutrition Goals Re-Evaluation): Nutrition Goals Re-Evaluation - 03/29/17 1401      Goals   Current Weight  188 lb 3.2 oz (85.4 kg)    Comment  Patient continues to try to eat heart healthy. He has  stopped eating sweets. He is on prednisone for arthritis which he attributes his weight gain to. He hopes to stop medication soon.     Expected Outcome  Patient will continue to work toward meeting his nurtition goal.       Personal Goal #2 Re-Evaluation    Personal Goal #2  Continue to eat a heart healthy diet.        Psychosocial: Target Goals: Acknowledge presence or absence of significant depression and/or stress, maximize coping skills, provide positive support system. Participant is able to verbalize types and ability to use techniques and skills needed for reducing stress and depression.  Initial Review & Psychosocial Screening: Initial Psych Review & Screening - 03/09/17 1444      Initial Review   Current issues with  None Identified      Family Dynamics   Good Support System?  Yes      Barriers   Psychosocial barriers to participate in program  There are no identifiable barriers or psychosocial needs.      Screening Interventions   Interventions  Encouraged to exercise       Quality of Life Scores: Quality of Life - 03/09/17 1405      Quality of Life Scores   Health/Function Pre  23.2 %    Socioeconomic Pre  27.56 %    Psych/Spiritual Pre  23.86 %    Family Pre  25.13 %    GLOBAL Pre  24.59 %       PHQ-9: Recent Review Flowsheet Data    Depression screen [facility] 2/9 03/09/2017 09/06/2016 08/18/2016 01/18/2016   Decreased Interest 0 1 0 0   Down, Depressed, Hopeless 0 1 0 0   PHQ - 2 Score 0 2 0 0   Altered sleeping 2 3 0 -   Tired, decreased energy 2 3 0 -   Change in appetite 1 2 0 -   Feeling bad or failure about yourself  0 0 0 -   Trouble concentrating 1 1 0 -   Moving slowly or fidgety/restless 1 1 0 -   Suicidal thoughts 0 0 0 -   PHQ-9 Score 7 12 0 -   Difficult doing work/chores Not difficult at all Somewhat difficult Not difficult at all -     Interpretation of Total Score  Total Score Depression Severity:  1-4 = Minimal depression, 5-9 = Mild depression, 10-14 = Moderate depression, 15-19 = Moderately severe depression, 20-27 = Severe depression   Psychosocial Evaluation and Intervention: Psychosocial Evaluation - 03/09/17 1445      Psychosocial Evaluation & Interventions   Interventions   Encouraged to exercise with the program and follow exercise prescription    Continue Psychosocial Services   No Follow up required Patient is in a better place this time. QOL scores are 24. 59.       Psychosocial Re-Evaluation: Psychosocial Re-Evaluation    Row Name 03/29/17 1403 04/26/17 1345 05/15/17 1405         Psychosocial Re-Evaluation   Current issues with  Current Depression  None Identified  None Identified     Comments  Patient is not interested in treatment.   -  -     Expected Outcomes  Patient's QOL score was 24.59 and his PHQ-9 score was 7.  Patient will have no psychosocial barriers identified at discharge.   Patient will have no psychosocial barriers identified at discharge.      Interventions  Encouraged to attend Pulmonary Rehabilitation for the  exercise;Stress management education;Relaxation education  -  Stress management education;Encouraged to attend Cardiac Rehabilitation for the exercise;Relaxation education     Continue Psychosocial Services   Follow up required by staff  No Follow up required  No Follow up required        Psychosocial Discharge (Final Psychosocial Re-Evaluation): Psychosocial Re-Evaluation - 05/15/17 1405      Psychosocial Re-Evaluation   Current issues with  None Identified    Expected Outcomes  Patient will have no psychosocial barriers identified at discharge.     Interventions  Stress management education;Encouraged to attend Cardiac Rehabilitation for the exercise;Relaxation education    Continue Psychosocial Services   No Follow up required       Vocational Rehabilitation: Provide vocational rehab assistance to qualifying candidates.   Vocational Rehab Evaluation & Intervention: Vocational Rehab - 03/09/17 1415      Initial Vocational Rehab Evaluation & Intervention   Assessment shows need for Vocational Rehabilitation  No       Education: Education Goals: Education classes will be provided on a weekly basis, covering  required topics. Participant will state understanding/return demonstration of topics presented.  Learning Barriers/Preferences: Learning Barriers/Preferences - 03/09/17 1414      Learning Barriers/Preferences   Learning Barriers  None    Learning Preferences  Skilled Demonstration;Verbal Instruction;Individual Instruction;Group Instruction       Education Topics: Hypertension, Hypertension Reduction -Define heart disease and high blood pressure. Discus how high blood pressure affects the body and ways to reduce high blood pressure.   CARDIAC REHAB PHASE II EXERCISE from 05/03/2017 in [place]  Date  04/05/17  Educator  Estelle  Instruction Review Code  2- Demonstrated Understanding      Exercise and Your Heart -Discuss why it is important to exercise, the FITT principles of exercise, normal and abnormal responses to exercise, and how to exercise safely.   CARDIAC REHAB PHASE II EXERCISE from 05/03/2017 in [place]  Date  04/12/17  Educator  DC  Instruction Review Code  2- Demonstrated Understanding      Angina -Discuss definition of angina, causes of angina, treatment of angina, and how to decrease risk of having angina.   CARDIAC REHAB PHASE II EXERCISE from 05/03/2017 in [place]  Date  04/19/17  Educator  D. Coad  Instruction Review Code  2- Demonstrated Understanding      Cardiac Medications -Review what the following cardiac medications are used for, how they affect the body, and side effects that may occur when taking the medications.  Medications include Aspirin, Beta blockers, calcium channel blockers, ACE Inhibitors, angiotensin receptor blockers, diuretics, digoxin, and antihyperlipidemics.   CARDIAC REHAB PHASE II EXERCISE from 05/03/2017 in [place]  Date  04/26/17  Educator  DJ  Instruction Review Code  2- Demonstrated Understanding      Congestive Heart  Failure -Discuss the definition of CHF, how to live with CHF, the signs and symptoms of CHF, and how keep track of weight and sodium intake.   CARDIAC REHAB PHASE II EXERCISE from 05/03/2017 in [place]  Date  05/03/17  Educator  DC  Instruction Review Code  2- Demonstrated Understanding      Heart Disease and Intimacy -Discus the effect sexual activity has on the heart, how changes occur during intimacy as we age, and safety during sexual activity.   Smoking Cessation / COPD -Discuss different methods to quit smoking, the health benefits of quitting smoking,  and the definition of COPD.   Nutrition I: Fats -Discuss the types of cholesterol, what cholesterol does to the heart, and how cholesterol levels can be controlled.   Nutrition II: Labels -Discuss the different components of food labels and how to read food label   PULMONARY REHAB OTHER RESPIRATORY from 12/29/2016 in [place]  Date  11/24/16  Educator  Merriam Woods  Instruction Review Code  2- meets goals/outcomes      Heart Parts and Heart Disease -Discuss the anatomy of the heart, the pathway of blood circulation through the heart, and these are affected by heart disease.   Stress I: Signs and Symptoms -Discuss the causes of stress, how stress may lead to anxiety and depression, and ways to limit stress.   PULMONARY REHAB OTHER RESPIRATORY from 12/29/2016 in [place]  Date  12/08/16  Educator  [place]  Instruction Review Code  2- meets goals/outcomes      Stress II: Relaxation -Discuss different types of relaxation techniques to limit stress.   CARDIAC REHAB PHASE II EXERCISE from 05/03/2017 in [place]  Date  03/15/17  Educator  DJ  Instruction Review Code  2- Demonstrated Understanding      Warning Signs of Stroke / TIA -Discuss definition of a stroke, what the signs and symptoms are of a stroke, and how to identify when  someone is having stroke.   CARDIAC REHAB PHASE II EXERCISE from 05/03/2017 in [place]  Date  03/22/17  Educator  DC  Instruction Review Code  2- Demonstrated Understanding      Knowledge Questionnaire Score: Knowledge Questionnaire Score - 03/09/17 1415      Knowledge Questionnaire Score   Pre Score  19/24       Core Components/Risk Factors/Patient Goals at Admission: Personal Goals and Risk Factors at Admission - 03/09/17 1441      Core Components/Risk Factors/Patient Goals on Admission    Weight Management  Yes    Admit Weight  184 lb 1.6 oz (83.5 kg)    Goal Weight: Short Term  179 lb 1.6 oz (81.2 kg)    Goal Weight: Long Term  174 lb 1.6 oz (79 kg)    Expected Outcomes  Short Term: Continue to assess and modify interventions until short term weight is achieved;Long Term: Adherence to nutrition and physical activity/exercise program aimed toward attainment of established weight goal    Personal Goal Other  Yes    Personal Goal  Breathe better, lose 10 lbs    Intervention  Attend CR 3 x week and supplement with home exercise 2 x week.    Expected Outcomes  Achieve personal goals.        Core Components/Risk Factors/Patient Goals Review:  Goals and Risk Factor Review    Row Name 03/29/17 1357 04/26/17 1336 05/15/17 1402         Core Components/Risk Factors/Patient Goals Review   Personal Goals Review  Weight Management/Obesity;Improve shortness of breath with ADL's Lose weight; get stronger; continue to breathe better.   Weight Management/Obesity;Improve shortness of breath with ADL's;Diabetes Lose weight; get stronger; continue to breathe better.   Weight Management/Obesity;Improve shortness of breath with ADL's;Diabetes Lose weight; get stronger; continue to breathe better.      Review  Patient has completed 8 sessions gaining 4 lbs. He has cut down on the amount of sweets he is eating. He blames his weight gain on taking prednisone for his  arthritis. He hopes to  get off of medication soon. He says his appetite has improved so he is eating more. He says he feels better and is getting some stronger. He is able to do more around the house. Will continue to monitor.   Patient has completed 18 sessions maintaining his weight since last 30 day review. He is still on Prednisone with a tappering dosage. He is down to 4 mg daily. He believes the Prednisone is impeding his weight loss. His last A1C was 6.4 08/18/16. He is doing well in the program with progression. He says his SOB continues to improve with activity. He says he has more energy. 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 He says the program is helping his overall. Will continue to monitor.   Patient has completed 23 sessions maintaining his weight since last 30 day review. He continues to do well in the program with progression. He startes he feels stronger and is breathing better since he started. He says he is able to do more now with more energy. 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 Will continue to monitor.      Expected Outcomes  Patient will complete the program and continue to work toward meeting his personal goals.   Patient will complete the program and continue to meet his personal goals.   Patient will complete the program and continue to meet his personal goals.         Core Components/Risk Factors/Patient Goals at Discharge (Final Review):  Goals and Risk Factor Review - 05/15/17 1402      Core Components/Risk Factors/Patient Goals Review   Personal Goals Review  Weight Management/Obesity;Improve shortness of breath with ADL's;Diabetes Lose weight; get stronger; continue to breathe better.     Review  Patient has completed 23 sessions maintaining his weight since last 30 day review. He continues to do well in the program with progression. He startes he feels stronger and is  breathing better since he started. He says he is able to do more now with more energy. 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 Will continue to monitor.     Expected Outcomes  Patient will complete the program and continue to meet his personal goals.        ITP Comments: ITP Comments    Row Name 03/09/17 1416           ITP Comments  Mr. Magnan is coming back to the program after having stent placement. He finished our pulmonary program 6 months ago.           Comments: ITP 30 Day REVIEW Patient continues to do well in the program. Will continue to monitor for progress.

## 2017-05-17 ENCOUNTER — Encounter (HOSPITAL_COMMUNITY)
Admission: RE | Admit: 2017-05-17 | Discharge: 2017-05-17 | Disposition: A | Discharge status: Home / Self Care | Payer: Medicare Other | Source: Ambulatory Visit | Attending: Internal Medicine | Admitting: Internal Medicine

## 2017-05-17 DIAGNOSIS — Z955 Presence of coronary angioplasty implant and graft: Secondary | ICD-10-CM

## 2017-05-17 DIAGNOSIS — R0689 Other abnormalities of breathing: Secondary | ICD-10-CM

## 2017-05-17 DIAGNOSIS — R0600 Dyspnea, unspecified: Secondary | ICD-10-CM

## 2017-05-17 NOTE — Progress Notes (Signed)
Daily Session Note  Patient Details  Name: Cleve Paolillo MRN: [id number] Date of Birth: 1946-03-17 Referring Provider:     CARDIAC REHAB PHASE II ORIENTATION from 03/09/2017 in [place]  Referring Provider  [name]      Encounter Date: 05/17/2017  Check In: Session Check In - 05/17/17 0930      Check-In   Location  [facility]    Staff Present  Aundra Dubin, RN, BSN;Gregory Luther Parody, BS, EP, Exercise Physiologist;Diane Coad, MS, EP, [facility], Exercise Physiologist    Supervising physician immediately available to respond to emergencies  See telemetry face sheet for immediately available MD    Medication changes reported      No    Fall or balance concerns reported     No    Warm-up and Cool-down  Performed as group-led instruction    Resistance Training Performed  Yes    VAD Patient?  No      Pain Assessment   Currently in Pain?  No/denies    Pain Score  0-No pain    Multiple Pain Sites  No       Capillary Blood Glucose: No results found for this or any previous visit (from the past 24 hour(s)).    Social History   Tobacco Use  Smoking Status Never Smoker  Smokeless Tobacco Never Used    Goals Met:  Independence with exercise equipment Exercise tolerated well No report of cardiac concerns or symptoms Strength training completed today  Goals Unmet:  Not Applicable  Comments: Check out 1030.   [name] is Medical Director for [facility] Cardiac and Pulmonary Rehab.

## 2017-05-19 ENCOUNTER — Encounter (HOSPITAL_COMMUNITY)
Admission: RE | Admit: 2017-05-19 | Discharge: 2017-05-19 | Disposition: A | Discharge status: Home / Self Care | Payer: Medicare Other | Source: Ambulatory Visit | Attending: Internal Medicine | Admitting: Internal Medicine

## 2017-05-19 DIAGNOSIS — Z955 Presence of coronary angioplasty implant and graft: Secondary | ICD-10-CM

## 2017-05-19 DIAGNOSIS — R0689 Other abnormalities of breathing: Secondary | ICD-10-CM

## 2017-05-19 DIAGNOSIS — R0600 Dyspnea, unspecified: Secondary | ICD-10-CM

## 2017-05-19 NOTE — Progress Notes (Signed)
Daily Session Note  Patient Details  Name: Caleb Butler MRN: [id number] Date of Birth: 1946/03/24 Referring Provider:     CARDIAC REHAB PHASE II ORIENTATION from 03/09/2017 in [place]  Referring Provider  [name]      Encounter Date: 05/19/2017  Check In: Session Check In - 05/19/17 0930      Check-In   Location  [facility]    Staff Present  Aundra Dubin, RN, BSN;Diane Coad, MS, EP, [facility], Exercise Physiologist    Supervising physician immediately available to respond to emergencies  See telemetry face sheet for immediately available MD    Medication changes reported      No    Fall or balance concerns reported     No    Warm-up and Cool-down  Performed as group-led instruction    Resistance Training Performed  Yes    VAD Patient?  No      Pain Assessment   Currently in Pain?  No/denies    Pain Score  0-No pain    Multiple Pain Sites  No       Capillary Blood Glucose: No results found for this or any previous visit (from the past 24 hour(s)).    Social History   Tobacco Use  Smoking Status Never Smoker  Smokeless Tobacco Never Used    Goals Met:  Independence with exercise equipment Exercise tolerated well No report of cardiac concerns or symptoms Strength training completed today  Goals Unmet:  Not Applicable  Comments: Check out 1030.   [name] is Medical Director for [facility] Cardiac and Pulmonary Rehab.

## 2017-05-22 ENCOUNTER — Encounter (HOSPITAL_COMMUNITY): Payer: Medicare Other

## 2017-05-24 ENCOUNTER — Encounter (HOSPITAL_COMMUNITY)
Admission: RE | Admit: 2017-05-24 | Discharge: 2017-05-24 | Disposition: A | Discharge status: Home / Self Care | Payer: Medicare Other | Source: Ambulatory Visit | Attending: Internal Medicine | Admitting: Internal Medicine

## 2017-05-24 DIAGNOSIS — Z955 Presence of coronary angioplasty implant and graft: Secondary | ICD-10-CM | POA: Diagnosis not present

## 2017-05-24 NOTE — Progress Notes (Signed)
Daily Session Note  Patient Details  Name: Caleb Butler MRN: [id number] Date of Birth: [birth year] Referring Provider:     CARDIAC REHAB PHASE II ORIENTATION from 03/09/2017 in [place]  Referring Provider  [name]      Encounter Date: 05/24/2017  Check In: Session Check In - 05/24/17 0930      Check-In   Location  [facility]    Staff Present  Russella Dar, MS, EP, [facility], Exercise Physiologist;Gregory Luther Parody, BS, EP, Exercise Physiologist    Supervising physician immediately available to respond to emergencies  See telemetry face sheet for immediately available MD    Medication changes reported      No    Fall or balance concerns reported     No    Tobacco Cessation  No Change    Warm-up and Cool-down  Performed as group-led instruction    Resistance Training Performed  Yes    VAD Patient?  No      Pain Assessment   Currently in Pain?  No/denies    Pain Score  0-No pain    Multiple Pain Sites  No       Capillary Blood Glucose: No results found for this or any previous visit (from the past 24 hour(s)).    Social History   Tobacco Use  Smoking Status Never Smoker  Smokeless Tobacco Never Used    Goals Met:  Independence with exercise equipment Exercise tolerated well No report of cardiac concerns or symptoms Strength training completed today  Goals Unmet:  Not Applicable  Comments: Check out: 1030   [name] is Medical Director for [place] and Pulmonary Rehab.

## 2017-05-26 ENCOUNTER — Encounter (HOSPITAL_COMMUNITY)
Admission: RE | Admit: 2017-05-26 | Discharge: 2017-05-26 | Disposition: A | Discharge status: Home / Self Care | Payer: Medicare Other | Source: Ambulatory Visit | Attending: Internal Medicine | Admitting: Internal Medicine

## 2017-05-26 DIAGNOSIS — Z955 Presence of coronary angioplasty implant and graft: Secondary | ICD-10-CM

## 2017-05-26 DIAGNOSIS — R0689 Other abnormalities of breathing: Secondary | ICD-10-CM

## 2017-05-26 DIAGNOSIS — R0600 Dyspnea, unspecified: Secondary | ICD-10-CM

## 2017-05-26 NOTE — Progress Notes (Signed)
Daily Session Note  Patient Details  Name: Caleb Butler MRN: [id number] Date of Birth: [birth year] Referring Provider:     CARDIAC REHAB PHASE II ORIENTATION from 03/09/2017 in [name]  Referring Provider  [name]      Encounter Date: 05/26/2017  Check In: Session Check In - 05/26/17 0925      Check-In   Location  [facility]    Staff Present  Russella Dar, MS, EP, [facility], Exercise Physiologist;Nitzia Perren Luther Parody, BS, EP, Exercise Physiologist    Supervising physician immediately available to respond to emergencies  See telemetry face sheet for immediately available MD    Medication changes reported      No    Fall or balance concerns reported     No    Warm-up and Cool-down  Performed as group-led instruction    Resistance Training Performed  Yes    VAD Patient?  No      Pain Assessment   Currently in Pain?  No/denies    Pain Score  0-No pain    Multiple Pain Sites  No       Capillary Blood Glucose: No results found for this or any previous visit (from the past 24 hour(s)).    Social History   Tobacco Use  Smoking Status Never Smoker  Smokeless Tobacco Never Used    Goals Met:  Independence with exercise equipment Exercise tolerated well No report of cardiac concerns or symptoms Strength training completed today  Goals Unmet:  Not Applicable  Comments: Check out 1030   [name] is Medical Director for [name] and Pulmonary Rehab.

## 2017-05-29 ENCOUNTER — Encounter (HOSPITAL_COMMUNITY)
Admission: RE | Admit: 2017-05-29 | Discharge: 2017-05-29 | Disposition: A | Discharge status: Home / Self Care | Payer: Medicare Other | Source: Ambulatory Visit | Attending: Internal Medicine | Admitting: Internal Medicine

## 2017-05-29 DIAGNOSIS — Z955 Presence of coronary angioplasty implant and graft: Secondary | ICD-10-CM | POA: Diagnosis not present

## 2017-05-29 NOTE — Progress Notes (Signed)
Daily Session Note  Patient Details  Name: Caleb Butler MRN: [id number] Date of Birth: 15-Sep-1945 Referring Provider:     CARDIAC REHAB PHASE II ORIENTATION from 03/09/2017 in [place]  Referring Provider  [name]      Encounter Date: 05/29/2017  Check In: Session Check In - 05/29/17 0929      Check-In   Location  [facility]    Staff Present  Aundra Dubin, RN, BSN;Gregory Luther Parody, BS, EP, Exercise Physiologist    Supervising physician immediately available to respond to emergencies  See telemetry face sheet for immediately available MD    Medication changes reported      No    Fall or balance concerns reported     No    Warm-up and Cool-down  Performed as group-led instruction    Resistance Training Performed  No    VAD Patient?  No      Pain Assessment   Currently in Pain?  No/denies    Pain Score  0-No pain    Multiple Pain Sites  No       Capillary Blood Glucose: No results found for this or any previous visit (from the past 24 hour(s)).    Social History   Tobacco Use  Smoking Status Never Smoker  Smokeless Tobacco Never Used    Goals Met:  Independence with exercise equipment Exercise tolerated well No report of cardiac concerns or symptoms Strength training completed today  Goals Unmet:  Not Applicable  Comments: Check out 1030.   [name] is Medical Director for [facility] Cardiac and Pulmonary Rehab.

## 2017-05-31 ENCOUNTER — Encounter (HOSPITAL_COMMUNITY)
Admission: RE | Admit: 2017-05-31 | Discharge: 2017-05-31 | Disposition: A | Discharge status: Home / Self Care | Payer: Medicare Other | Source: Ambulatory Visit | Attending: Internal Medicine | Admitting: Internal Medicine

## 2017-05-31 DIAGNOSIS — F419 Anxiety disorder, unspecified: Secondary | ICD-10-CM | POA: Diagnosis not present

## 2017-05-31 DIAGNOSIS — Z7952 Long term (current) use of systemic steroids: Secondary | ICD-10-CM | POA: Insufficient documentation

## 2017-05-31 DIAGNOSIS — K589 Irritable bowel syndrome without diarrhea: Secondary | ICD-10-CM | POA: Insufficient documentation

## 2017-05-31 DIAGNOSIS — Z79899 Other long term (current) drug therapy: Secondary | ICD-10-CM | POA: Insufficient documentation

## 2017-05-31 DIAGNOSIS — Z955 Presence of coronary angioplasty implant and graft: Secondary | ICD-10-CM | POA: Diagnosis not present

## 2017-05-31 DIAGNOSIS — E7800 Pure hypercholesterolemia, unspecified: Secondary | ICD-10-CM | POA: Diagnosis not present

## 2017-05-31 DIAGNOSIS — K219 Gastro-esophageal reflux disease without esophagitis: Secondary | ICD-10-CM | POA: Insufficient documentation

## 2017-05-31 DIAGNOSIS — E119 Type 2 diabetes mellitus without complications: Secondary | ICD-10-CM | POA: Diagnosis not present

## 2017-05-31 DIAGNOSIS — Z7984 Long term (current) use of oral hypoglycemic drugs: Secondary | ICD-10-CM | POA: Insufficient documentation

## 2017-05-31 DIAGNOSIS — E039 Hypothyroidism, unspecified: Secondary | ICD-10-CM | POA: Diagnosis not present

## 2017-05-31 DIAGNOSIS — M353 Polymyalgia rheumatica: Secondary | ICD-10-CM | POA: Insufficient documentation

## 2017-05-31 DIAGNOSIS — Z7902 Long term (current) use of antithrombotics/antiplatelets: Secondary | ICD-10-CM | POA: Insufficient documentation

## 2017-05-31 DIAGNOSIS — I2510 Atherosclerotic heart disease of native coronary artery without angina pectoris: Secondary | ICD-10-CM | POA: Insufficient documentation

## 2017-05-31 DIAGNOSIS — Z7982 Long term (current) use of aspirin: Secondary | ICD-10-CM | POA: Diagnosis not present

## 2017-05-31 NOTE — Progress Notes (Signed)
Daily Session Note  Patient Details  Name: Caleb Butler MRN: [id number] Date of Birth: 26-Aug-1945 Referring Provider:     CARDIAC REHAB PHASE II ORIENTATION from 03/09/2017 in [place]  Referring Provider  [name]      Encounter Date: 05/31/2017  Check In: Session Check In - 05/31/17 0930      Check-In   Location  [facility]    Staff Present  Aundra Dubin, RN, BSN;Gregory Luther Parody, BS, EP, Exercise Physiologist    Supervising physician immediately available to respond to emergencies  See telemetry face sheet for immediately available MD    Medication changes reported      No    Fall or balance concerns reported     No    Warm-up and Cool-down  Performed as group-led instruction    Resistance Training Performed  No    VAD Patient?  No      Pain Assessment   Currently in Pain?  No/denies    Pain Score  0-No pain    Multiple Pain Sites  No       Capillary Blood Glucose: No results found for this or any previous visit (from the past 24 hour(s)).    Social History   Tobacco Use  Smoking Status Never Smoker  Smokeless Tobacco Never Used    Goals Met:  Independence with exercise equipment Exercise tolerated well No report of cardiac concerns or symptoms Strength training completed today  Goals Unmet:  Not Applicable  Comments: Check out 1030.   [name] is Medical Director for [facility] Cardiac and Pulmonary Rehab.

## 2017-06-02 ENCOUNTER — Encounter (HOSPITAL_COMMUNITY)
Admission: RE | Admit: 2017-06-02 | Discharge: 2017-06-02 | Disposition: A | Discharge status: Home / Self Care | Payer: Medicare Other | Source: Ambulatory Visit | Attending: Internal Medicine | Admitting: Internal Medicine

## 2017-06-02 DIAGNOSIS — Z955 Presence of coronary angioplasty implant and graft: Secondary | ICD-10-CM

## 2017-06-02 DIAGNOSIS — R0689 Other abnormalities of breathing: Secondary | ICD-10-CM

## 2017-06-02 DIAGNOSIS — R0600 Dyspnea, unspecified: Secondary | ICD-10-CM

## 2017-06-02 NOTE — Progress Notes (Signed)
Daily Session Note  Patient Details  Name: Karmello Abercrombie MRN: [id number] Date of Birth: [birth year] Referring Provider:     CARDIAC REHAB PHASE II ORIENTATION from 03/09/2017 in [place]  Referring Provider  [name]      Encounter Date: 06/02/2017  Check In: Session Check In - 06/02/17 8063      Check-In   Location  [facility]    Staff Present  Suzanne Boron, BS, EP, Exercise Physiologist;Debra Wynetta Emery, RN, BSN    Supervising physician immediately available to respond to emergencies  See telemetry face sheet for immediately available MD    Medication changes reported      No    Fall or balance concerns reported     No    Warm-up and Cool-down  Performed as group-led instruction    Resistance Training Performed  Yes    VAD Patient?  No      Pain Assessment   Currently in Pain?  No/denies    Pain Score  0-No pain    Multiple Pain Sites  No       Capillary Blood Glucose: No results found for this or any previous visit (from the past 24 hour(s)).    Social History   Tobacco Use  Smoking Status Never Smoker  Smokeless Tobacco Never Used    Goals Met:  Independence with exercise equipment Exercise tolerated well No report of cardiac concerns or symptoms Strength training completed today  Goals Unmet:  Not Applicable  Comments: Check out 1030   [name] is Medical Director for [place] and Pulmonary Rehab.

## 2017-06-05 ENCOUNTER — Encounter (HOSPITAL_COMMUNITY)
Admission: RE | Admit: 2017-06-05 | Discharge: 2017-06-05 | Disposition: A | Discharge status: Home / Self Care | Payer: Medicare Other | Source: Ambulatory Visit | Attending: Internal Medicine | Admitting: Internal Medicine

## 2017-06-05 ENCOUNTER — Telehealth (HOSPITAL_COMMUNITY): Payer: Self-pay

## 2017-06-05 DIAGNOSIS — Z955 Presence of coronary angioplasty implant and graft: Secondary | ICD-10-CM | POA: Diagnosis not present

## 2017-06-05 DIAGNOSIS — R0689 Other abnormalities of breathing: Secondary | ICD-10-CM

## 2017-06-05 DIAGNOSIS — R0600 Dyspnea, unspecified: Secondary | ICD-10-CM

## 2017-06-05 NOTE — Telephone Encounter (Signed)
Can he see me at 0930 on Wed 06/07/17? If recurs needs to go to ED.   Could you also get more history? What he was doing when it started? How often has he had in past several weeks? Any trauma? Etc.   Legrand Como [address] Cyril, PA-C 06/05/2017 10:28 AM

## 2017-06-05 NOTE — Progress Notes (Signed)
Daily Session Note  Patient Details  Name: Hiroto Saltzman MRN: [id number] Date of Birth: 1945-09-24 Referring Provider:     CARDIAC REHAB PHASE II ORIENTATION from 03/09/2017 in [place]  Referring Provider  [name]      Encounter Date: 06/05/2017  Check In: Session Check In - 06/05/17 0942      Check-In   Location  [facility]    Staff Present  Suzanne Boron, BS, EP, Exercise Physiologist    Supervising physician immediately available to respond to emergencies  See telemetry face sheet for immediately available MD    Medication changes reported      No    Fall or balance concerns reported     No    Warm-up and Cool-down  Performed as group-led instruction    Resistance Training Performed  Yes    VAD Patient?  No      Pain Assessment   Currently in Pain?  No/denies    Pain Score  0-No pain    Multiple Pain Sites  No       Capillary Blood Glucose: No results found for this or any previous visit (from the past 24 hour(s)).    Social History   Tobacco Use  Smoking Status Never Smoker  Smokeless Tobacco Never Used    Goals Met:  Independence with exercise equipment Exercise tolerated well No report of cardiac concerns or symptoms Strength training completed today  Goals Unmet:  Not Applicable  Comments: Check out 1030   [name] is Medical Director for [place] and Pulmonary Rehab.

## 2017-06-05 NOTE — Telephone Encounter (Signed)
Sorry, this is the second time happened a month ago at rest. Pt is coming at the requested appt.

## 2017-06-05 NOTE — Telephone Encounter (Signed)
Advanced Heart Failure Triage Encounter  Patient Name: Caleb Butler  Date of Call: 06/05/17  Problem:  Rehab RN (Diane) called b/c pt c/o of chest pain last night that was sharp that lasted for 10 mins. Pt did not take anything. Showed up to Cardiac Rehab today feeling fine, denies: sob, cp, swelling, or a cough. Explained the next time he should go to ED. Was last seen in 12/2016 with 6 month f/u planned (2/19)   Plan:    Shirley Muscat, RN

## 2017-06-07 ENCOUNTER — Encounter (HOSPITAL_COMMUNITY): Payer: Medicare Other

## 2017-06-07 ENCOUNTER — Ambulatory Visit (HOSPITAL_COMMUNITY)
Admission: RE | Admit: 2017-06-07 | Discharge: 2017-06-07 | Disposition: A | Discharge status: Home / Self Care | Payer: Medicare Other | Source: Ambulatory Visit | Attending: Internal Medicine | Admitting: Internal Medicine

## 2017-06-07 ENCOUNTER — Encounter (HOSPITAL_COMMUNITY): Payer: Self-pay

## 2017-06-07 ENCOUNTER — Other Ambulatory Visit: Payer: Self-pay

## 2017-06-07 VITALS — BP 110/72 | HR 64 | Wt 190.2 lb

## 2017-06-07 DIAGNOSIS — E785 Hyperlipidemia, unspecified: Secondary | ICD-10-CM | POA: Insufficient documentation

## 2017-06-07 DIAGNOSIS — I2510 Atherosclerotic heart disease of native coronary artery without angina pectoris: Secondary | ICD-10-CM | POA: Diagnosis not present

## 2017-06-07 DIAGNOSIS — Z7982 Long term (current) use of aspirin: Secondary | ICD-10-CM | POA: Diagnosis not present

## 2017-06-07 DIAGNOSIS — Z79899 Other long term (current) drug therapy: Secondary | ICD-10-CM | POA: Insufficient documentation

## 2017-06-07 DIAGNOSIS — E119 Type 2 diabetes mellitus without complications: Secondary | ICD-10-CM | POA: Diagnosis not present

## 2017-06-07 DIAGNOSIS — Z7984 Long term (current) use of oral hypoglycemic drugs: Secondary | ICD-10-CM | POA: Insufficient documentation

## 2017-06-07 DIAGNOSIS — R079 Chest pain, unspecified: Secondary | ICD-10-CM | POA: Diagnosis not present

## 2017-06-07 NOTE — Progress Notes (Signed)
Advanced Heart Failure Clinic Note   Referring Physician: Dr. Domenic Polite Primary Care: Jenna Luo, MD Primary Cardiologist: Dr. Domenic Polite  HPI:  Caleb Butler is a 72 y.o. male with history of long-standing, progressive dyspnea on exertion (NYHA 2-3), Hypothyroidism, DM2, and HLD.  Right and left heart cath in Feb. 2018 showed a 90% ostial LCx lesion that involved the ostium of the Ramus as well. Also with a 40% proximal RCA lesion. It was felt at the time that this was not favorable to PCI. He underwent a Lexiscan myoview to assess ischemic burden which was not high. He was continued on medical therapy.   Pt presents today for add on due to chest pain. States several weeks ago he had an episode of sharp "grabbing" chest pain, that felt similar to a Nerve conduction study he had in the past.  He had one other episode of chest pressure in the past month while on the treadmill that felt like a chest pressure. He did not alert CR to this as it resolved quickly with rest.  He has been more SOB with his household chores and taking care of the animals for the past month. Some of this he has attributed to the cold weather.  Despite this, he states he has walked up to 5 miles with CR 3 times a week without CP or SOB, only having the above symptoms intermittently.  He is taking all medications as directed. Previously taken off Imdur due to intolerable headaches.   Review of systems complete and found to be negative unless listed in HPI.    01/06/2017  Successful PCI of severe ostial stenosis in the left circumflex, DES placed.  Recommendations: Aspirin and Plavix for a minimum of 12 months. If tolerated favor long-term DAPT as the stent is positioned at the left main bifurcation.  Cardiac cath 07/07/16   The left ventricular ejection fraction is 55-65% by visual estimate.  Prox RCA lesion, 40 %stenosed.  Ost Cx lesion, 90 %stenosed.  Ramus lesion, 90 %stenosed.  LM lesion, 20  %stenosed.  Mid LAD lesion, 20 %stenosed.  1st Diag lesion, 80 %stenosed.  Findings: RA = 5 RV = 23/8 PA = 27/4 (16) PCW = 3 Fick cardiac output/index = 5.2/2.6 PVR = 2.5 WU  Ao sat =95% PA sat = 68%, 69% Assessment: 1. 1-V CAD at ostium of LCX also involving ostium of ramus 2. Aneursymal proximal LAD with mild plaque 3. 40% Proximal RCA 4. Normal LVEF with normal hemodynamics  Echo 02/2016 LVEF 60-65%, Grade 2 DD, Mild AI, Trivial MR, Pa pear pressure 24 mm Hg. Normal RV.  High res Chest CT 03/13/2016 - No evidence of ILD. Mild patchy bibasilar scarring and volume loss. Incidental finding of AAA. + coronary artery calcification, + right adrenal myelolipoma.   PFTS 11/2015 FVC 2.75 (64%) FEV1 2.05 (65%) TLC 5.26 (77%) DLCO 20.70 (66%) -> corrects to 100% with alveolar volume.   CPX 05/06/16 Pre-Exercise PFTs  FVC 3.05 (74%)    FEV1 2.19 (70%)       FEV1/FVC 72 (94%)     MVV 86 (69%) Post-Exercise PFTs ((from lowest post-exercise trial (%change from rest)) FVC performed IPE, 5, 10, 15 mins FVC 3.19 (+6%) IPE    FEV1 2.44 (+11%)  10 mins     FEV1/FVC 73 (+1%)    Exercise Time:  8:30      Watts: 120 RPE: 19 Reason stopped: Patient ended test due to dyspnea (9/10) and leg fatigue Additional symptoms: Back  pain (7-8/10) with weight on left leg Resting HR: 79 Peak HR: 135  (89% age predicted max HR) BP rest: 134/66 BP peak: 128/66 Peak VO2: 19.8 (86% predicted peak VO2) VE/VCO2 slope: 44 OUES: 1.46 Peak RER: 1.11 Ventilatory Threshold: 15.2 (66% predicted or measured peak VO2) Peak RR 53 Peak Ventilation: 88.8 VE/MVV: 103% PETCO2 at peak: 22 O2pulse: 12  (92% predicted O2pulse)  Family History: Father had CHF (unclear etiology) and CKD (Passed at 41 yo) Mother passed of MI at 1 yo.  Social History: Lives with wife in [place]. Raises Livestock.  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    Past  Medical History:  Diagnosis Date  . Anxiety   . Arthritis    "my back is loaded w/it" (01/06/2017)  . CAD (coronary artery disease)    80% ostial lesion in L circumflex (2017)  . Chronic lower back pain   . GERD (gastroesophageal reflux disease)   . Hiatal hernia   . High cholesterol   . Hypothyroidism   . IBS (irritable bowel syndrome)   . Numbness and tingling of right lower extremity    "since 11/30/2016" (01/06/2017)  . PMR (polymyalgia rheumatica) ([facility])   . Type 2 diabetes mellitus ([place])     Current Outpatient Medications  Medication Sig Dispense Refill  . acetaminophen (TYLENOL) 325 MG tablet Take 650 mg by mouth every 4 (four) hours as needed for mild pain.     Marland Kitchen aspirin 81 MG EC tablet Take 81 mg by mouth at bedtime.     . clopidogrel (PLAVIX) 75 MG tablet Take 1 tablet (75 mg total) by mouth daily. 90 tablet 3  . glucose blood test strip Check BS BID Dx: E11.9 100 each 5  . Lancets (ACCU-CHEK MULTICLIX) lancets Check BS BID Dx: E11.9 100 each 12  . levothyroxine (SYNTHROID, LEVOTHROID) 100 MCG tablet Take 1 tablet (100 mcg total) by mouth daily before breakfast. 90 tablet 3  . metFORMIN (GLUCOPHAGE) 1000 MG tablet Take 1 tablet (1,000 mg total) by mouth 2 (two) times daily with a meal. 180 tablet 3  . Multiple Vitamin (MULTIVITAMIN WITH MINERALS) TABS tablet Take 1 tablet by mouth daily.    . predniSONE (DELTASONE) 5 MG tablet Take 4 mg by mouth daily with breakfast. Take with #2 of the 1 mg tablets daily    . rosuvastatin (CRESTOR) 20 MG tablet Take 1 tablet (20 mg total) at bedtime by mouth. 90 tablet 3  . sitaGLIPtin (JANUVIA) 100 MG tablet Take 1 tablet (100 mg total) by mouth daily. 90 tablet 1  . vitamin B-12 (CYANOCOBALAMIN) 1000 MCG tablet Take 1,000 mcg by mouth daily.     No current facility-administered medications for this encounter.     Allergies  Allergen Reactions  . Bee Venom Swelling  . Ceclor [Cefaclor] Hives  . Ciprofloxacin Hives  . Shellfish Allergy  Hives    Pt states it is questionable// he eats seafood still without problem.      Social History   Socioeconomic History  . Marital status: Married    Spouse name: [name]  . Number of children: 0  . Years of education: 32  . Highest education level: Not on file  Social Needs  . Financial resource strain: Not on file  . Food insecurity - worry: Not on file  . Food insecurity - inability: Not on file  . Transportation needs - medical: Not on file  . Transportation needs - non-medical: Not on file  Occupational  History    Comment: retired  Tobacco Use  . Smoking status: Never Smoker  . Smokeless tobacco: Never Used  Substance and Sexual Activity  . Alcohol use: Yes    Alcohol/week: 1.2 oz    Types: 1 Glasses of wine, 1 Cans of beer per week    Comment: 1 beer/week  . Drug use: No  . Sexual activity: Not Currently  Other Topics Concern  . Not on file  Social History Narrative   Retired. Lives with wife, [name].    Caffeine- coffee, 1 daily     Family History  Problem Relation Age of Onset  . Diabetes Mellitus II Father   . Heart failure Father   . Kidney failure Father   . Heart attack Mother    Vitals:   06/07/17 0939  BP: 110/72  Pulse: 64  SpO2: 98%  Weight: 190 lb 3.2 oz (86.3 kg)    PHYSICAL EXAM: General: Well appearing. No resp difficulty. HEENT: Normal Neck: Supple. JVP 5-6. Carotids 2+ bilat; no bruits. No thyromegaly or nodule noted. Cor: PMI nondisplaced. RRR, No M/G/R noted Lungs: CTAB, normal effort. Abdomen: Soft, non-tender, non-distended, no HSM. No bruits or masses. +BS  Extremities: No cyanosis, clubbing, or rash. R and LLE no edema.  Neuro: Alert & orientedx3, cranial nerves grossly intact. moves all 4 extremities w/o difficulty. Affect pleasant   ASSESSMENT & PLAN: 1. CAD with chronic stable angina - S/P PCI of severe ostial stenosis in the left circumflex, with DES placed.  - Mixed history with occasional atypical chest pain.   His anginal equivalent appears to be SOB, and concern raised initially over decreased ability to do chores. However pt tolerating up to 5 miles on treadmill and elliptical and cardiac rehab without difficulty.      - Low threshold to relook cath with any recurrent symptoms, but overall seems stable for now. Pt knows to call immediately with any worsening symptoms.  - Continue ASA and Plavix for 12 months as tolerated per Dr York Cerise recommendation.  - Continue Crestor 20 mg daily - Continue Cardiac rehab.  2. Hyperlipidemia - Continue crestor.  3. Diabetes  - Consider Jardiance  Follow up 4-6 months.  Sooner with symptoms.   Shirley Friar, PA-C 06/07/17   Patient seen and examined with the above-signed Advanced Practice Provider and/or Housestaff. I personally reviewed laboratory data, imaging studies and relevant notes. I independently examined the patient and formulated the important aspects of the plan. I have edited the note to reflect any of my changes or salient points. I have personally discussed the plan with the patient and/or family.  Chest pain atypical. Functional status improving. Seems to be making continued improvements. Volume status ok. Continue aggressive RF modification with LDL < 70.  Discussed that if exertional dyspnea recurs may need relook cath.   Glori Bickers, MD  4:57 PM

## 2017-06-07 NOTE — Patient Instructions (Signed)
No changes to medication at this time.  Follow up 4 months with [name]. We will call you closer to this time, or you may call our office to schedule 1 month before you are due to be seen. Take all medication as prescribed the day of your appointment. Bring all medications with you to your appointment.  Do the following things EVERYDAY: 1) Weigh yourself in the morning before breakfast. Write it down and keep it in a log. 2) Take your medicines as prescribed 3) Eat low salt foods-Limit salt (sodium) to 2000 mg per day.  4) Stay as active as you can everyday 5) Limit all fluids for the day to less than 2 liters

## 2017-06-09 ENCOUNTER — Encounter (HOSPITAL_COMMUNITY)
Admission: RE | Admit: 2017-06-09 | Discharge: 2017-06-09 | Disposition: A | Discharge status: Home / Self Care | Payer: Medicare Other | Source: Ambulatory Visit | Attending: Internal Medicine | Admitting: Internal Medicine

## 2017-06-09 DIAGNOSIS — Z955 Presence of coronary angioplasty implant and graft: Secondary | ICD-10-CM

## 2017-06-09 NOTE — Progress Notes (Signed)
Daily Session Note  Patient Details  Name: Moss Berry MRN: [id number] Date of Birth: [birth year] Referring Provider:     CARDIAC REHAB PHASE II ORIENTATION from 03/09/2017 in [place]   Referring Provider  [name]      Encounter Date: 06/09/2017  Check In: Session Check In - 06/09/17 0928      Check-In   Location  [facility]    Staff Present  Suzanne Boron, BS, EP, Exercise Physiologist;Braeden Dolinski Wynetta Emery, RN, BSN    Supervising physician immediately available to respond to emergencies  See telemetry face sheet for immediately available MD    Medication changes reported      No    Fall or balance concerns reported     No    Warm-up and Cool-down  Performed as group-led instruction    Resistance Training Performed  Yes    VAD Patient?  No      Pain Assessment   Currently in Pain?  No/denies    Pain Score  0-No pain    Multiple Pain Sites  No       Capillary Blood Glucose: No results found for this or any previous visit (from the past 24 hour(s)).    Social History   Tobacco Use  Smoking Status Never Smoker  Smokeless Tobacco Never Used    Goals Met:  Independence with exercise equipment Exercise tolerated well No report of cardiac concerns or symptoms Strength training completed today  Goals Unmet:  Not Applicable  Comments: Check out 1030.   [name] is Medical Director for [facility].

## 2017-06-12 ENCOUNTER — Encounter (HOSPITAL_COMMUNITY): Payer: Medicare Other

## 2017-06-12 NOTE — Progress Notes (Signed)
Cardiac Individual Treatment Plan  Patient Details  Name: Caleb Butler MRN: [id number] Date of Birth: 04-09-1946 Referring Provider:     CARDIAC REHAB PHASE II ORIENTATION from 03/09/2017 in [place]  Referring Provider  [name]      Initial Encounter Date:    CARDIAC REHAB PHASE II ORIENTATION from 03/09/2017 in [place]  Date  03/09/17  Referring Provider  [name]      Visit Diagnosis: Status post coronary artery stent placement  Patient's Home Medications on Admission:  Current Outpatient Medications:  .  acetaminophen (TYLENOL) 325 MG tablet, Take 650 mg by mouth every 4 (four) hours as needed for mild pain. , Disp: , Rfl:  .  aspirin 81 MG EC tablet, Take 81 mg by mouth at bedtime. , Disp: , Rfl:  .  clopidogrel (PLAVIX) 75 MG tablet, Take 1 tablet (75 mg total) by mouth daily., Disp: 90 tablet, Rfl: 3 .  glucose blood test strip, Check BS BID Dx: E11.9, Disp: 100 each, Rfl: 5 .  Lancets (ACCU-CHEK MULTICLIX) lancets, Check BS BID Dx: E11.9, Disp: 100 each, Rfl: 12 .  levothyroxine (SYNTHROID, LEVOTHROID) 100 MCG tablet, Take 1 tablet (100 mcg total) by mouth daily before breakfast., Disp: 90 tablet, Rfl: 3 .  metFORMIN (GLUCOPHAGE) 1000 MG tablet, Take 1 tablet (1,000 mg total) by mouth 2 (two) times daily with a meal., Disp: 180 tablet, Rfl: 3 .  Multiple Vitamin (MULTIVITAMIN WITH MINERALS) TABS tablet, Take 1 tablet by mouth daily., Disp: , Rfl:  .  predniSONE (DELTASONE) 5 MG tablet, Take 4 mg by mouth daily with breakfast. Take with #2 of the 1 mg tablets daily, Disp: , Rfl:  .  rosuvastatin (CRESTOR) 20 MG tablet, Take 1 tablet (20 mg total) at bedtime by mouth., Disp: 90 tablet, Rfl: 3 .  sitaGLIPtin (JANUVIA) 100 MG tablet, Take 1 tablet (100 mg total) by mouth daily., Disp: 90 tablet, Rfl: 1 .  vitamin B-12 (CYANOCOBALAMIN) 1000 MCG tablet, Take 1,000 mcg by mouth daily., Disp: , Rfl:   Past  Medical History: Past Medical History:  Diagnosis Date  . Anxiety   . Arthritis    "my back is loaded w/it" (01/06/2017)  . CAD (coronary artery disease)    80% ostial lesion in L circumflex (2017)  . Chronic lower back pain   . GERD (gastroesophageal reflux disease)   . Hiatal hernia   . High cholesterol   . Hypothyroidism   . IBS (irritable bowel syndrome)   . Numbness and tingling of right lower extremity    "since 11/30/2016" (01/06/2017)  . PMR (polymyalgia rheumatica) (HCC)   . Type 2 diabetes mellitus (HCC)     Tobacco Use: Social History   Tobacco Use  Smoking Status Never Smoker  Smokeless Tobacco Never Used    Labs: Recent Review Flowsheet Data    Labs for ITP Cardiac and Pulmonary Rehab Latest Ref Rng & Units 11/30/2015 07/07/2016 07/07/2016 07/07/2016 08/18/2016   Cholestrol 125 - 200 mg/dL 140 - - - -   LDLCALC <130 mg/dL 57 - - - -   HDL >=40 mg/dL 41 - - - -   Trlycerides <150 mg/dL 210(H) - - - -   Hemoglobin A1c <5.7 % 7.3(H) - - - 6.4(H)   PHART 7.350 - 7.450 - 7.382 - - -   PCO2ART 32.0 - 48.0 mmHg - 40.7 - - -   HCO3 20.0 - 28.0 mmol/L - 24.2 21.2 24.1 -  TCO2 0 - 100 mmol/L - 25 22 25  -   ACIDBASEDEF 0.0 - 2.0 mmol/L - 1.0 5.0(H) 2.0 -   O2SAT % - 95.0 68.0 69.0 -      Capillary Blood Glucose: Lab Results  Component Value Date   GLUCAP 167 (H) 01/07/2017   GLUCAP 178 (H) 01/06/2017   GLUCAP 249 (H) 01/06/2017   GLUCAP 250 (H) 01/06/2017   GLUCAP 197 (H) 01/06/2017     Exercise Target Goals:    Exercise Program Goal: Individual exercise prescription set with THRR, safety & activity barriers. Participant demonstrates ability to understand and report RPE using BORG scale, to self-measure pulse accurately, and to acknowledge the importance of the exercise prescription.  Exercise Prescription Goal: Starting with aerobic activity 30 plus minutes a day, 3 days per week for initial exercise prescription. Provide home exercise prescription and guidelines  that participant acknowledges understanding prior to discharge.  Activity Barriers & Risk Stratification: Activity Barriers & Cardiac Risk Stratification - 03/09/17 1401      Activity Barriers & Cardiac Risk Stratification   Activity Barriers  Joint Problems;Shortness of Breath    Cardiac Risk Stratification  Moderate Moderate to High       6 Minute Walk: 6 Minute Walk    Row Name 03/09/17 1400         6 Minute Walk   Phase  Initial     Distance  1250 feet     Distance % Change  0 %     Distance Feet Change  0 ft     Walk Time  6 minutes     # of Rest Breaks  0     MPH  2.36     METS  2.81     RPE  9     Perceived Dyspnea   9     VO2 Peak  9.28     Symptoms  No     Resting HR  81 bpm     Resting BP  102/62     Resting Oxygen Saturation   96 %     Exercise Oxygen Saturation  during 6 min walk  96 %     Max Ex. HR  88 bpm     Max Ex. BP  116/64     2 Minute Post BP  104/62        Oxygen Initial Assessment:   Oxygen Re-Evaluation:   Oxygen Discharge (Final Oxygen Re-Evaluation):   Initial Exercise Prescription: Initial Exercise Prescription - 03/09/17 1400      Date of Initial Exercise RX and Referring Provider   Date  03/09/17    Referring Provider  [name]      Treadmill   MPH  2    Grade  0    Minutes  15    METs  2.53      Recumbant Elliptical   Level  1    RPM  79    Watts  46    Minutes  1.7    METs  3.4      Prescription Details   Frequency (times per week)  3    Duration  Progress to 30 minutes of continuous aerobic without signs/symptoms of physical distress      Intensity   THRR 40-80% of Max Heartrate  [phone]    Ratings of Perceived Exertion  11-13    Perceived Dyspnea  0-4      Progression   Progression  Continue progressive  overload as per policy without signs/symptoms or physical distress.      Resistance Training   Training Prescription  Yes    Weight  1    Reps  10-15       Perform Capillary Blood Glucose  checks as needed.  Exercise Prescription Changes:  Exercise Prescription Changes    Row Name 03/23/17 0700 03/23/17 1500 04/11/17 1500 04/25/17 0700 05/16/17 0700     Response to Exercise   Blood Pressure (Admit)  -  100/58  100/62  100/62  112/64   Blood Pressure (Exercise)  -  100/60  120/68  118/60  118/66   Blood Pressure (Exit)  -  100/60  106/60  98/56  108/64   Heart Rate (Admit)  -  86 bpm  84 bpm  79 bpm  78 bpm   Heart Rate (Exercise)  -  106 bpm  109 bpm  112 bpm  103 bpm   Heart Rate (Exit)  -  86 bpm  94 bpm  90 bpm  85 bpm   Rating of Perceived Exertion (Exercise)  -  11  11  12  12    Duration  -  Progress to 30 minutes of  aerobic without signs/symptoms of physical distress  Progress to 30 minutes of  aerobic without signs/symptoms of physical distress  Progress to 30 minutes of  aerobic without signs/symptoms of physical distress  Progress to 30 minutes of  aerobic without signs/symptoms of physical distress   Intensity  -  THRR New [phone]  THRR New [phone]  THRR New [phone]  THRR New [phone]     Progression   Progression  -  Continue to progress workloads to maintain intensity without signs/symptoms of physical distress.  Continue to progress workloads to maintain intensity without signs/symptoms of physical distress.  Continue to progress workloads to maintain intensity without signs/symptoms of physical distress.  Continue to progress workloads to maintain intensity without signs/symptoms of physical distress.     Resistance Training   Training Prescription  Yes  Yes  Yes  Yes  Yes   Weight  1  2  2  2  5    Reps  10-15  10-15  10-15  10-15  10-15     Treadmill   MPH  2  2.2  2.7  2.8  3   Grade  0  0  0  0  0.05   Minutes  15  15  15  15  15    METs  2.53  2.6  3.1  3.1  3.4     Recumbant Elliptical   Level  1  2  2  2  3    RPM  79  61  63  60  63   Watts  46  79  80  77  90   Minutes  1.7  20  20  20  20    METs  3.4  4  4.2  4.2  5.2     Home  Exercise Plan   Plans to continue exercise at  Home (comment)  Home (comment)  Home (comment)  Home (comment)  Home (comment)   Frequency  Add 2 additional days to program exercise sessions.  Add 2 additional days to program exercise sessions.  Add 2 additional days to program exercise sessions.  Add 2 additional days to program exercise sessions.  Add 2 additional days to program exercise sessions.   Initial Home Exercises Provided  03/22/17  03/22/17  03/22/17  03/22/17  03/22/17   Row Name 05/29/17 1000 06/09/17 1400           Response to Exercise   Blood Pressure (Admit)  104/64  118/62      Blood Pressure (Exercise)  110/64  140/70      Blood Pressure (Exit)  100/62  100/62      Heart Rate (Admit)  80 bpm  74 bpm      Heart Rate (Exercise)  107 bpm  110 bpm      Heart Rate (Exit)  87 bpm  84 bpm      Rating of Perceived Exertion (Exercise)  12  12      Duration  Progress to 30 minutes of  aerobic without signs/symptoms of physical distress  Progress to 30 minutes of  aerobic without signs/symptoms of physical distress      Intensity  THRR New [phone]  THRR New [phone]        Progression   Progression  Continue to progress workloads to maintain intensity without signs/symptoms of physical distress.  Continue to progress workloads to maintain intensity without signs/symptoms of physical distress.        Resistance Training   Training Prescription  Yes  Yes      Weight  5  5      Reps  10-15  10-15        Treadmill   MPH  3.1  3.2      Grade  0.5  0.5      Minutes  15  15      METs  3.5  3.7        Recumbant Elliptical   Level  3  3      RPM  61  72      Watts  92  95      Minutes  20  20      METs  4.6  5.2        Home Exercise Plan   Plans to continue exercise at  Home (comment)  Home (comment)      Frequency  Add 2 additional days to program exercise sessions.  Add 2 additional days to program exercise sessions.      Initial Home Exercises Provided  03/22/17   03/22/17         Exercise Comments:  Exercise Comments    Row Name 03/13/17 1438 03/23/17 0736 03/23/17 1522 04/25/17 0801 05/16/17 0801   Exercise Comments  Patient has not yet started CR and will be progressed in time.   Patient received the take home exercise plan this day 03/22/2017. THR was addressed as were safe ways of staying active when not in CR. Patient demonstrated an understanding and was encouraged to ask any questions in the future.   Patient is doing well in CR. He is getting back to where he was when he left [facility]. 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  Patient is doing well in CR. He has increased his speed on the treadmill and is maintaining his levels and watts on the elliptical. He is still veru active outside of CR, working on a farm. He state sthat this program is helping him.  Patient is doing well in CR. He has progressed on both machines and has increased his SPMs on the elliptical and speed on the treadmill. He states that this program  is helping him have more energy when on the farm and is giving him more endurance.   [place] Name 05/29/17 1029           Exercise Comments  Patient is doing well in CR and has increased speed on the treadmill as well as SPMs on the Elliptical. Patient states that this has helped him gained strength and stamina for on the farm and at work.           Exercise Goals and Review:  Exercise Goals    Row Name 03/09/17 1404             Exercise Goals   Increase Physical Activity  Yes       Intervention  Provide advice, education, support and counseling about physical activity/exercise needs.;Develop an individualized exercise prescription for aerobic and resistive training based on initial evaluation findings, risk stratification, comorbidities and participant's personal goals.       Expected Outcomes  Achievement of increased cardiorespiratory fitness and enhanced  flexibility, muscular endurance and strength shown through measurements of functional capacity and personal statement of participant.       Increase Strength and Stamina  Yes       Intervention  Provide advice, education, support and counseling about physical activity/exercise needs.;Develop an individualized exercise prescription for aerobic and resistive training based on initial evaluation findings, risk stratification, comorbidities and participant's personal goals.       Expected Outcomes  Achievement of increased cardiorespiratory fitness and enhanced flexibility, muscular endurance and strength shown through measurements of functional capacity and personal statement of participant.       Able to understand and use rate of perceived exertion (RPE) scale  Yes       Intervention  Provide education and explanation on how to use RPE scale       Expected Outcomes  Short Term: Able to use RPE daily in rehab to express subjective intensity level;Long Term:  Able to use RPE to guide intensity level when exercising independently       Able to understand and use Dyspnea scale  Yes       Intervention  Provide education and explanation on how to use Dyspnea scale       Expected Outcomes  Short Term: Able to use Dyspnea scale daily in rehab to express subjective sense of shortness of breath during exertion;Long Term: Able to use Dyspnea scale to guide intensity level when exercising independently       Knowledge and understanding of Target Heart Rate Range (THRR)  Yes       Intervention  Provide education and explanation of THRR including how the numbers were predicted and where they are located for reference       Expected Outcomes  Short Term: Able to state/look up THRR;Short Term: Able to use daily as guideline for intensity in rehab;Long Term: Able to use THRR to govern intensity when exercising independently       Able to check pulse independently  Yes       Intervention  Provide education and  demonstration on how to check pulse in carotid and radial arteries.;Review the importance of being able to check your own pulse for safety during independent exercise       Expected Outcomes  Short Term: Able to explain why pulse checking is important during independent exercise;Long Term: Able to check pulse independently and accurately       Understanding of Exercise Prescription  Yes  Intervention  Provide education, explanation, and written materials on patient's individual exercise prescription       Expected Outcomes  Short Term: Able to explain program exercise prescription;Long Term: Able to explain home exercise prescription to exercise independently          Exercise Goals Re-Evaluation : Exercise Goals Re-Evaluation    Row Name 03/23/17 1521 04/25/17 0759 05/16/17 0800 06/09/17 1417       Exercise Goal Re-Evaluation   Exercise Goals Review  Increase Physical Activity;Increase Strength and Stamina;Knowledge and understanding of Target Heart Rate Range (THRR)  Increase Strength and Stamina;Increase Physical Activity;Knowledge and understanding of Target Heart Rate Range (THRR)  Increase Physical Activity;Increase Strength and Stamina;Knowledge and understanding of Target Heart Rate Range (THRR)  Increase Physical Activity;Increase Strength and Stamina;Knowledge and understanding of Target Heart Rate Range (THRR)    Comments  Patient is doing well in CR. He is getting back to where he was when he left [place]. 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   Patient is doing well in CR. He has increased his speed on the treadmill and is maintaining his levels and watts on the elliptical. He is still veru active outside of CR, working on a farm. He state sthat this program is helping him.   Patient is doing well in CR. He has progressed on both machines and has increased his SPMs on the elliptical and speed on the treadmill. 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   Patient is doing well in CR. He has increased his speed on the treadmill to 3.2 while maintaining a slight incline. Patient states that he continues to feel progress while he is home and on the farm.     Expected Outcomes  Patient wishes to breathe easier and to lose weight.   Patient wishes to breathe better and to lose weight over time.   Patient wishes to breathe easier and to lose weight.   Patient wishes to breathe easier and to lose weight         Discharge Exercise Prescription (Final Exercise Prescription Changes): Exercise Prescription Changes - 06/09/17 1400      Response to Exercise   Blood Pressure (Admit)  118/62    Blood Pressure (Exercise)  140/70    Blood Pressure (Exit)  100/62    Heart Rate (Admit)  74 bpm    Heart Rate (Exercise)  110 bpm    Heart Rate (Exit)  84 bpm    Rating of Perceived Exertion (Exercise)  12    Duration  Progress to 30 minutes of  aerobic without signs/symptoms of physical distress    Intensity  THRR New [phone]      Progression   Progression  Continue to progress workloads to maintain intensity without signs/symptoms of physical distress.      Resistance Training   Training Prescription  Yes    Weight  5    Reps  10-15      Treadmill   MPH  3.2    Grade  0.5    Minutes  15    METs  3.7      Recumbant Elliptical   Level  3    RPM  72    Watts  95    Minutes  20    METs  5.2  Home Exercise Plan   Plans to continue exercise at  Home (comment)    Frequency  Add 2 additional days to program exercise sessions.    Initial Home Exercises Provided  03/22/17       Nutrition:  Target Goals: Understanding of nutrition guidelines, daily intake of sodium 1500mg , cholesterol 200mg , calories 30% from fat and 7% or less from saturated fats, daily to have 5 or more servings of fruits and vegetables.  Biometrics: Pre  Biometrics - 03/09/17 1404      Pre Biometrics   Height  5\' 9"  (1.753 m)    Weight  184 lb 1.6 oz (83.5 kg)    Waist Circumference  37 inches    Hip Circumference  39 inches    Waist to Hip Ratio  0.95 %    BMI (Calculated)  27.17    Triceps Skinfold  7 mm    % Body Fat  22.8 %    Grip Strength  72.27 kg    Flexibility  10.67 in    Single Leg Stand  7 seconds        Nutrition Therapy Plan and Nutrition Goals: Nutrition Therapy & Goals - 06/12/17 1101      Nutrition Therapy   RD appointment defered  Yes      Personal Nutrition Goals   Personal Goal #2  Continue to eat a heart healthy diet.     Additional Goals?  No    Comments  Patient says he is eating a heart healthy diet.       Intervention Plan   Intervention  Nutrition handout(s) given to patient.    Expected Outcomes  Short Term Goal: Understand basic principles of dietary content, such as calories, fat, sodium, cholesterol and nutrients.       Nutrition Discharge: Rate Your Plate Scores: Nutrition Assessments - 03/09/17 1438      MEDFICTS Scores   Pre Score  41       Nutrition Goals Re-Evaluation: Nutrition Goals Re-Evaluation    Row Name 03/29/17 1401             Goals   Current Weight  188 lb 3.2 oz (85.4 kg)       Comment  Patient continues to try to eat heart healthy. He has stopped eating sweets. He is on prednisone for arthritis which he attributes his weight gain to. He hopes to stop medication soon.        Expected Outcome  Patient will continue to work toward meeting his nurtition goal.          Personal Goal #2 Re-Evaluation   Personal Goal #2  Continue to eat a heart healthy diet.           Nutrition Goals Discharge (Final Nutrition Goals Re-Evaluation): Nutrition Goals Re-Evaluation - 03/29/17 1401      Goals   Current Weight  188 lb 3.2 oz (85.4 kg)    Comment  Patient continues to try to eat heart healthy. He has stopped eating sweets. He is on prednisone for arthritis which he  attributes his weight gain to. He hopes to stop medication soon.     Expected Outcome  Patient will continue to work toward meeting his nurtition goal.       Personal Goal #2 Re-Evaluation   Personal Goal #2  Continue to eat a heart healthy diet.        Psychosocial: Target Goals: Acknowledge presence or absence of significant depression and/or stress, maximize  coping skills, provide positive support system. Participant is able to verbalize types and ability to use techniques and skills needed for reducing stress and depression.  Initial Review & Psychosocial Screening: Initial Psych Review & Screening - 03/09/17 1444      Initial Review   Current issues with  None Identified      Family Dynamics   Good Support System?  Yes      Barriers   Psychosocial barriers to participate in program  There are no identifiable barriers or psychosocial needs.      Screening Interventions   Interventions  Encouraged to exercise       Quality of Life Scores: Quality of Life - 03/09/17 1405      Quality of Life Scores   Health/Function Pre  23.2 %    Socioeconomic Pre  27.56 %    Psych/Spiritual Pre  23.86 %    Family Pre  25.13 %    GLOBAL Pre  24.59 %       PHQ-9: Recent Review Flowsheet Data    Depression screen [facility] 2/9 03/09/2017 09/06/2016 08/18/2016 01/18/2016   Decreased Interest 0 1 0 0   Down, Depressed, Hopeless 0 1 0 0   PHQ - 2 Score 0 2 0 0   Altered sleeping 2 3 0 -   Tired, decreased energy 2 3 0 -   Change in appetite 1 2 0 -   Feeling bad or failure about yourself  0 0 0 -   Trouble concentrating 1 1 0 -   Moving slowly or fidgety/restless 1 1 0 -   Suicidal thoughts 0 0 0 -   PHQ-9 Score 7 12 0 -   Difficult doing work/chores Not difficult at all Somewhat difficult Not difficult at all -     Interpretation of Total Score  Total Score Depression Severity:  1-4 = Minimal depression, 5-9 = Mild depression, 10-14 = Moderate depression, 15-19 = Moderately severe  depression, 20-27 = Severe depression   Psychosocial Evaluation and Intervention: Psychosocial Evaluation - 03/09/17 1445      Psychosocial Evaluation & Interventions   Interventions  Encouraged to exercise with the program and follow exercise prescription    Continue Psychosocial Services   No Follow up required Patient is in a better place this time. QOL scores are 24. 59.       Psychosocial Re-Evaluation: Psychosocial Re-Evaluation    Row Name 03/29/17 1403 04/26/17 1345 05/15/17 1405 06/12/17 1115       Psychosocial Re-Evaluation   Current issues with  Current Depression  None Identified  None Identified  None Identified    Comments  Patient is not interested in treatment.   -  -  Patient is not interested in treatment.     Expected Outcomes  Patient's QOL score was 24.59 and his PHQ-9 score was 7.  Patient will have no psychosocial barriers identified at discharge.   Patient will have no psychosocial barriers identified at discharge.   Patient will have no psychosocial barriers identified at discharge.     Interventions  Encouraged to attend Pulmonary Rehabilitation for the exercise;Stress management education;Relaxation education  -  Stress management education;Encouraged to attend Cardiac Rehabilitation for the exercise;Relaxation education  Stress management education;Encouraged to attend Cardiac Rehabilitation for the exercise;Relaxation education    Continue Psychosocial Services   Follow up required by staff  No Follow up required  No Follow up required  No Follow up required  Psychosocial Discharge (Final Psychosocial Re-Evaluation): Psychosocial Re-Evaluation - 06/12/17 1115      Psychosocial Re-Evaluation   Current issues with  None Identified    Comments  Patient is not interested in treatment.     Expected Outcomes  Patient will have no psychosocial barriers identified at discharge.     Interventions  Stress management education;Encouraged to attend Cardiac  Rehabilitation for the exercise;Relaxation education    Continue Psychosocial Services   No Follow up required       Vocational Rehabilitation: Provide vocational rehab assistance to qualifying candidates.   Vocational Rehab Evaluation & Intervention: Vocational Rehab - 03/09/17 1415      Initial Vocational Rehab Evaluation & Intervention   Assessment shows need for Vocational Rehabilitation  No       Education: Education Goals: Education classes will be provided on a weekly basis, covering required topics. Participant will state understanding/return demonstration of topics presented.  Learning Barriers/Preferences: Learning Barriers/Preferences - 03/09/17 1414      Learning Barriers/Preferences   Learning Barriers  None    Learning Preferences  Skilled Demonstration;Verbal Instruction;Individual Instruction;Group Instruction       Education Topics: Hypertension, Hypertension Reduction -Define heart disease and high blood pressure. Discus how high blood pressure affects the body and ways to reduce high blood pressure.   CARDIAC REHAB PHASE II EXERCISE from 05/31/2017 in [place]  Date  04/05/17  Educator  [place]  Instruction Review Code  2- Demonstrated Understanding      Exercise and Your Heart -Discuss why it is important to exercise, the FITT principles of exercise, normal and abnormal responses to exercise, and how to exercise safely.   CARDIAC REHAB PHASE II EXERCISE from 05/31/2017 in [place]  Date  04/12/17  Educator  DC  Instruction Review Code  2- Demonstrated Understanding      Angina -Discuss definition of angina, causes of angina, treatment of angina, and how to decrease risk of having angina.   CARDIAC REHAB PHASE II EXERCISE from 05/31/2017 in [place]  Date  04/19/17  Educator  D. [place]  Instruction Review Code  2- Demonstrated Understanding      Cardiac Medications -Review what  the following cardiac medications are used for, how they affect the body, and side effects that may occur when taking the medications.  Medications include Aspirin, Beta blockers, calcium channel blockers, ACE Inhibitors, angiotensin receptor blockers, diuretics, digoxin, and antihyperlipidemics.   CARDIAC REHAB PHASE II EXERCISE from 05/31/2017 in [place]  Date  04/26/17  Educator  DJ  Instruction Review Code  2- Demonstrated Understanding      Congestive Heart Failure -Discuss the definition of CHF, how to live with CHF, the signs and symptoms of CHF, and how keep track of weight and sodium intake.   CARDIAC REHAB PHASE II EXERCISE from 05/31/2017 in [place]  Date  05/03/17  Educator  DC  Instruction Review Code  2- Demonstrated Understanding      Heart Disease and Intimacy -Discus the effect sexual activity has on the heart, how changes occur during intimacy as we age, and safety during sexual activity.   Smoking Cessation / COPD -Discuss different methods to quit smoking, the health benefits of quitting smoking, and the definition of COPD.   CARDIAC REHAB PHASE II EXERCISE from 05/31/2017 in [place]  Date  05/17/17  Educator  DC  Instruction Review Code  2- Demonstrated Understanding  Nutrition I: Fats -Discuss the types of cholesterol, what cholesterol does to the heart, and how cholesterol levels can be controlled.   CARDIAC REHAB PHASE II EXERCISE from 05/31/2017 in [place]  Date  05/24/17  Educator  DC  Instruction Review Code  2- Demonstrated Understanding      Nutrition II: Labels -Discuss the different components of food labels and how to read food label   CARDIAC REHAB PHASE II EXERCISE from 05/31/2017 in [place]  Date  05/31/17  Educator  [place]  Instruction Review Code  2- Demonstrated Understanding      Heart Parts and Heart  Disease -Discuss the anatomy of the heart, the pathway of blood circulation through the heart, and these are affected by heart disease.   Stress I: Signs and Symptoms -Discuss the causes of stress, how stress may lead to anxiety and depression, and ways to limit stress.   PULMONARY REHAB OTHER RESPIRATORY from 12/29/2016 in [place]  Date  12/08/16  Educator  [place]  Instruction Review Code  2- meets goals/outcomes      Stress II: Relaxation -Discuss different types of relaxation techniques to limit stress.   CARDIAC REHAB PHASE II EXERCISE from 05/31/2017 in [place]  Date  03/15/17  Educator  DJ  Instruction Review Code  2- Demonstrated Understanding      Warning Signs of Stroke / TIA -Discuss definition of a stroke, what the signs and symptoms are of a stroke, and how to identify when someone is having stroke.   CARDIAC REHAB PHASE II EXERCISE from 05/31/2017 in [place]  Date  03/22/17  Educator  DC  Instruction Review Code  2- Demonstrated Understanding      Knowledge Questionnaire Score: Knowledge Questionnaire Score - 03/09/17 1415      Knowledge Questionnaire Score   Pre Score  19/24       Core Components/Risk Factors/Patient Goals at Admission: Personal Goals and Risk Factors at Admission - 03/09/17 1441      Core Components/Risk Factors/Patient Goals on Admission    Weight Management  Yes    Admit Weight  184 lb 1.6 oz (83.5 kg)    Goal Weight: Short Term  179 lb 1.6 oz (81.2 kg)    Goal Weight: Long Term  174 lb 1.6 oz (79 kg)    Expected Outcomes  Short Term: Continue to assess and modify interventions until short term weight is achieved;Long Term: Adherence to nutrition and physical activity/exercise program aimed toward attainment of established weight goal    Personal Goal Other  Yes    Personal Goal  Breathe better, lose 10 lbs    Intervention  Attend CR 3 x week and supplement with home  exercise 2 x week.    Expected Outcomes  Achieve personal goals.        Core Components/Risk Factors/Patient Goals Review:  Goals and Risk Factor Review    Row Name 03/29/17 1357 04/26/17 1336 05/15/17 1402 06/12/17 1101       Core Components/Risk Factors/Patient Goals Review   Personal Goals Review  Weight Management/Obesity;Improve shortness of breath with ADL's Lose weight; get stronger; continue to breathe better.   Weight Management/Obesity;Improve shortness of breath with ADL's;Diabetes Lose weight; get stronger; continue to breathe better.   Weight Management/Obesity;Improve shortness of breath with ADL's;Diabetes Lose weight; get stronger; continue to breathe better.   Weight Management/Obesity;Improve shortness of breath with ADL's;Diabetes Lose weight; get stronger; continue  to breathe better.     Review  Patient has completed 8 sessions gaining 4 lbs. He has cut down on the amount of sweets he is eating. He blames his weight gain on taking prednisone for his arthritis. He hopes to get off of medication soon. He says his appetite has improved so he is eating more. He says he feels better and is getting some stronger. He is able to do more around the house. Will continue to monitor.   Patient has completed 18 sessions maintaining his weight since last 30 day review. He is still on Prednisone with a tappering dosage. He is down to 4 mg daily. He believes the Prednisone is impeding his weight loss. His last A1C was 6.4 08/18/16. He is doing well in the program with progression. He says his SOB continues to improve with activity. He says he has more energy. 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 He says the program is helping his overall. Will continue to monitor.   Patient has completed 23 sessions maintaining his weight since last 30 day review. He continues to do well in the program with progression. He startes he feels stronger and is breathing better  since he started. He says he is able to do more now with more energy. 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 Will continue to monitor.   Patient has completed 32 sessions maintaining his weight since last 30 day review. He continues to do well in the program with progression. His last A1C was 08/18/16 6.4. He says he continues to feel stronger and is breathing better since. 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 He did have an episode of chest pain 2 weeks ago on a Sunday. He saw his cardiologist the following week. MD told him if he has another episode he would recommend a catheterization. Will continue to monitor.     Expected Outcomes  Patient will complete the program and continue to work toward meeting his personal goals.   Patient will complete the program and continue to meet his personal goals.   Patient will complete the program and continue to meet his personal goals.   Patient will complete the program and continue to meet his personal goals.        Core Components/Risk Factors/Patient Goals at Discharge (Final Review):  Goals and Risk Factor Review - 06/12/17 1101      Core Components/Risk Factors/Patient Goals Review   Personal Goals Review  Weight Management/Obesity;Improve shortness of breath with ADL's;Diabetes Lose weight; get stronger; continue to breathe better.     Review  Patient has completed 32 sessions maintaining his weight since last 30 day review. He continues to do well in the program with progression. His last A1C was 08/18/16 6.4. He says he continues to feel stronger and is breathing better since. 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 He did have an episode of chest pain 2 weeks ago on a Sunday. He saw his cardiologist the following week. MD told him if he has another episode he  would recommend a catheterization. Will continue to monitor.     Expected Outcomes  Patient will complete the program and continue to meet his personal goals.  ITP Comments: ITP Comments    Row Name 03/09/17 1416           ITP Comments  Caleb Butler is coming back to the program after having stent placement. He finished our pulmonary program 6 months ago.           Comments: ITP 30 Day REVIEW Patient doing well in the program. Will continue to monitor for progress.

## 2017-06-14 ENCOUNTER — Encounter (HOSPITAL_COMMUNITY)
Admission: RE | Admit: 2017-06-14 | Discharge: 2017-06-14 | Disposition: A | Discharge status: Home / Self Care | Payer: Medicare Other | Source: Ambulatory Visit | Attending: Internal Medicine | Admitting: Internal Medicine

## 2017-06-14 DIAGNOSIS — Z955 Presence of coronary angioplasty implant and graft: Secondary | ICD-10-CM

## 2017-06-14 NOTE — Progress Notes (Signed)
Daily Session Note  Patient Details  Name: Caleb Butler MRN: [id number] Date of Birth: 1946/05/12 Referring Provider:     CARDIAC REHAB PHASE II ORIENTATION from 03/09/2017 in [place]  Referring Provider  [name]      Encounter Date: 06/14/2017  Check In: Session Check In - 06/14/17 0930      Check-In   Location  [facility]    Staff Present  Suzanne Boron, BS, EP, Exercise Physiologist;Vinal Rosengrant Wynetta Emery, RN, BSN    Supervising physician immediately available to respond to emergencies  See telemetry face sheet for immediately available MD    Medication changes reported      No    Fall or balance concerns reported     No    Warm-up and Cool-down  Performed as group-led instruction    Resistance Training Performed  Yes      Pain Assessment   Currently in Pain?  No/denies    Pain Score  0-No pain    Multiple Pain Sites  No       Capillary Blood Glucose: No results found for this or any previous visit (from the past 24 hour(s)).    Social History   Tobacco Use  Smoking Status Never Smoker  Smokeless Tobacco Never Used    Goals Met:  Independence with exercise equipment Exercise tolerated well No report of cardiac concerns or symptoms Strength training completed today  Goals Unmet:  Not Applicable  Comments: Check out 1030.   [name] is Medical Director for [facility] Cardiac and Pulmonary Rehab.

## 2017-06-16 ENCOUNTER — Encounter (HOSPITAL_COMMUNITY)
Admission: RE | Admit: 2017-06-16 | Discharge: 2017-06-16 | Disposition: A | Discharge status: Home / Self Care | Payer: Medicare Other | Source: Ambulatory Visit | Attending: Internal Medicine | Admitting: Internal Medicine

## 2017-06-16 DIAGNOSIS — Z955 Presence of coronary angioplasty implant and graft: Secondary | ICD-10-CM | POA: Diagnosis not present

## 2017-06-16 DIAGNOSIS — R0600 Dyspnea, unspecified: Secondary | ICD-10-CM

## 2017-06-16 DIAGNOSIS — R0689 Other abnormalities of breathing: Secondary | ICD-10-CM

## 2017-06-16 NOTE — Progress Notes (Signed)
Daily Session Note  Patient Details  Name: Caleb Butler MRN: [id number] Date of Birth: Oct 05, 1945 Referring Provider:     CARDIAC REHAB PHASE II ORIENTATION from 03/09/2017 in [place]  Referring Provider  [name]      Encounter Date: 06/16/2017  Check In: Session Check In - 06/16/17 0952      Check-In   Location  [facility]    Staff Present  Russella Dar, MS, EP, [facility], Exercise Physiologist;Debra Wynetta Emery, RN, BSN;Arnoldo Hildreth, BS, EP, Exercise Physiologist    Supervising physician immediately available to respond to emergencies  See telemetry face sheet for immediately available MD    Medication changes reported      No    Fall or balance concerns reported     No    Warm-up and Cool-down  Performed as group-led instruction    Resistance Training Performed  Yes    VAD Patient?  No      Pain Assessment   Currently in Pain?  No/denies    Pain Score  0-No pain    Multiple Pain Sites  No       Capillary Blood Glucose: No results found for this or any previous visit (from the past 24 hour(s)).    Social History   Tobacco Use  Smoking Status Never Smoker  Smokeless Tobacco Never Used    Goals Met:  Independence with exercise equipment Exercise tolerated well No report of cardiac concerns or symptoms Strength training completed today  Goals Unmet:  Not Applicable  Comments: Check out 1030   [name] is Medical Director for [facility] and Pulmonary Rehab.

## 2017-06-19 ENCOUNTER — Encounter (HOSPITAL_COMMUNITY)
Admission: RE | Admit: 2017-06-19 | Discharge: 2017-06-19 | Disposition: A | Discharge status: Home / Self Care | Payer: Medicare Other | Source: Ambulatory Visit | Attending: Internal Medicine | Admitting: Internal Medicine

## 2017-06-19 DIAGNOSIS — R0689 Other abnormalities of breathing: Secondary | ICD-10-CM

## 2017-06-19 DIAGNOSIS — Z955 Presence of coronary angioplasty implant and graft: Secondary | ICD-10-CM | POA: Diagnosis not present

## 2017-06-19 DIAGNOSIS — R0600 Dyspnea, unspecified: Secondary | ICD-10-CM

## 2017-06-19 NOTE — Progress Notes (Signed)
Daily Session Note  Patient Details  Name: Britney Captain MRN: [id number] Date of Birth: 29-Aug-1945 Referring Provider:     CARDIAC REHAB PHASE II ORIENTATION from 03/09/2017 in [place]  Referring Provider  [name]      Encounter Date: 06/19/2017  Check In: Session Check In - 06/19/17 1008      Check-In   Location  [facility]    Staff Present  Diane Angelina Pih, MS, EP, [facility], Exercise Physiologist;Debra Wynetta Emery, RN, BSN;Arlyce Circle, BS, EP, Exercise Physiologist    Supervising physician immediately available to respond to emergencies  See telemetry face sheet for immediately available MD    Medication changes reported      No    Fall or balance concerns reported     No    Warm-up and Cool-down  Performed as group-led instruction    Resistance Training Performed  Yes    VAD Patient?  No      Pain Assessment   Currently in Pain?  No/denies    Pain Score  0-No pain    Multiple Pain Sites  No       Capillary Blood Glucose: No results found for this or any previous visit (from the past 24 hour(s)).    Social History   Tobacco Use  Smoking Status Never Smoker  Smokeless Tobacco Never Used    Goals Met:  Independence with exercise equipment Exercise tolerated well No report of cardiac concerns or symptoms Strength training completed today  Goals Unmet:  Not Applicable  Comments: Check out 915   [name] is Medical Director for [facility] and Pulmonary Rehab.

## 2017-06-23 ENCOUNTER — Encounter (HOSPITAL_COMMUNITY)
Admission: RE | Admit: 2017-06-23 | Discharge: 2017-06-23 | Disposition: A | Discharge status: Home / Self Care | Payer: Medicare Other | Source: Ambulatory Visit | Attending: Internal Medicine | Admitting: Internal Medicine

## 2017-06-23 VITALS — Ht 69.0 in | Wt 188.6 lb

## 2017-06-23 DIAGNOSIS — Z955 Presence of coronary angioplasty implant and graft: Secondary | ICD-10-CM | POA: Diagnosis not present

## 2017-06-23 DIAGNOSIS — R0600 Dyspnea, unspecified: Secondary | ICD-10-CM

## 2017-06-23 DIAGNOSIS — R0689 Other abnormalities of breathing: Secondary | ICD-10-CM

## 2017-06-23 NOTE — Progress Notes (Signed)
Daily Session Note  Patient Details  Name: Caleb Butler MRN: [id number] Date of Birth: 06-02-1945 Referring Provider:     CARDIAC REHAB PHASE II ORIENTATION from 03/09/2017 in [place]  Referring Provider  [name]      Encounter Date: 06/23/2017  Check In: Session Check In - 06/23/17 0930      Check-In   Location  [facility]    Staff Present  Russella Dar, MS, EP, [facility], Exercise Physiologist;Safira Proffit Wynetta Emery, RN, BSN;Gregory Cowan, BS, EP, Exercise Physiologist    Supervising physician immediately available to respond to emergencies  See telemetry face sheet for immediately available MD    Medication changes reported      No    Fall or balance concerns reported     No    Warm-up and Cool-down  Performed as group-led instruction    Resistance Training Performed  Yes    VAD Patient?  No      Pain Assessment   Currently in Pain?  No/denies    Pain Score  0-No pain    Multiple Pain Sites  No       Capillary Blood Glucose: No results found for this or any previous visit (from the past 24 hour(s)).    Social History   Tobacco Use  Smoking Status Never Smoker  Smokeless Tobacco Never Used    Goals Met:  Independence with exercise equipment Exercise tolerated well No report of cardiac concerns or symptoms Strength training completed today  Goals Unmet:  Not Applicable  Comments: Check out 1030.   [name] is Medical Director for [facility] Cardiac and Pulmonary Rehab.

## 2017-06-26 ENCOUNTER — Encounter (HOSPITAL_COMMUNITY): Payer: Medicare Other

## 2017-07-13 NOTE — Addendum Note (Signed)
Encounter addended by: Suzanne Boron on: 07/13/2017 2:54 PM  Actions taken: Flowsheet data copied forward, Visit Navigator Flowsheet section accepted

## 2017-07-14 NOTE — Progress Notes (Signed)
Discharge Progress Report  Patient Details  Name: Caleb Butler MRN: [id number] Date of Birth: [birth year] Referring Provider:     CARDIAC REHAB PHASE II ORIENTATION from 03/09/2017 in [place]  Referring Provider  [name]       Number of Visits: 36  Reason for Discharge:  Patient reached a stable level of exercise. Patient independent in their exercise. Patient has met program and personal goals.  Smoking History:  Social History   Tobacco Use  Smoking Status Never Smoker  Smokeless Tobacco Never Used    Diagnosis:  Status post coronary artery stent placement  Dyspnea and respiratory abnormalities  ADL UCSD:   Initial Exercise Prescription: Initial Exercise Prescription - 03/09/17 1400      Date of Initial Exercise RX and Referring Provider   Date  03/09/17    Referring Provider  [name]      Treadmill   MPH  2    Grade  0    Minutes  15    METs  2.53      Recumbant Elliptical   Level  1    RPM  79    Watts  46    Minutes  1.7    METs  3.4      Prescription Details   Frequency (times per week)  3    Duration  Progress to 30 minutes of continuous aerobic without signs/symptoms of physical distress      Intensity   THRR 40-80% of Max Heartrate  [phone]    Ratings of Perceived Exertion  11-13    Perceived Dyspnea  0-4      Progression   Progression  Continue progressive overload as per policy without signs/symptoms or physical distress.      Resistance Training   Training Prescription  Yes    Weight  1    Reps  10-15       Discharge Exercise Prescription (Final Exercise Prescription Changes): Exercise Prescription Changes - 06/09/17 1400      Response to Exercise   Blood Pressure (Admit)  118/62    Blood Pressure (Exercise)  140/70    Blood Pressure (Exit)  100/62    Heart Rate (Admit)  74 bpm    Heart Rate (Exercise)  110 bpm    Heart Rate (Exit)  84 bpm    Rating of Perceived Exertion  (Exercise)  12    Duration  Progress to 30 minutes of  aerobic without signs/symptoms of physical distress    Intensity  THRR New [phone]      Progression   Progression  Continue to progress workloads to maintain intensity without signs/symptoms of physical distress.      Resistance Training   Training Prescription  Yes    Weight  5    Reps  10-15      Treadmill   MPH  3.2    Grade  0.5    Minutes  15    METs  3.7      Recumbant Elliptical   Level  3    RPM  72    Watts  95    Minutes  20    METs  5.2      Home Exercise Plan   Plans to continue exercise at  Home (comment)    Frequency  Add 2 additional days to program exercise sessions.    Initial Home Exercises Provided  03/22/17       Functional Capacity: 6 Minute  Walk    Row Name 03/09/17 1400 07/04/17 1251       6 Minute Walk   Phase  Initial  Discharge    Distance  1250 feet  1500 feet    Distance % Change  0 %  0 %    Distance Feet Change  0 ft  0 ft    Walk Time  6 minutes  6 minutes    # of Rest Breaks  0  0    MPH  2.36  2.84    METS  2.81  3.17    RPE  9  12    Perceived Dyspnea   9  14    VO2 Peak  9.28  10.97    Symptoms  No  No    Resting HR  81 bpm  96 bpm    Resting BP  102/62  100/60    Resting Oxygen Saturation   96 %  96 %    Exercise Oxygen Saturation  during 6 min walk  96 %  97 %    Max Ex. HR  88 bpm  97 bpm    Max Ex. BP  116/64  118/70    2 Minute Post BP  104/62  108/66       Psychological, QOL, Others - Outcomes: PHQ 2/9: Depression screen [facility] 2/9 07/14/2017 03/09/2017 09/06/2016 08/18/2016 01/18/2016  Decreased Interest 0 0 1 0 0  Down, Depressed, Hopeless 0 0 1 0 0  PHQ - 2 Score 0 0 2 0 0  Altered sleeping '1 2 3 '$ 0 -  Tired, decreased energy '1 2 3 '$ 0 -  Change in appetite 0 1 2 0 -  Feeling bad or failure about yourself  0 0 0 0 -  Trouble concentrating '1 1 1 '$ 0 -  Moving slowly or fidgety/restless '1 1 1 '$ 0 -  Suicidal thoughts 0 0 0 0 -  PHQ-9 Score '4 7 12 '$ 0 -   Difficult doing work/chores Not difficult at all Not difficult at all Somewhat difficult Not difficult at all -    Quality of Life: Quality of Life - 07/13/17 1443      Quality of Life Scores   Health/Function Pre  23.2 %    Health/Function Post  26.47 %    Health/Function % Change  14.09 %    Socioeconomic Pre  27.56 %    Socioeconomic Post  28 %    Socioeconomic % Change   1.6 %    Psych/Spiritual Pre  23.86 %    Psych/Spiritual Post  25.71 %    Psych/Spiritual % Change  7.75 %    Family Pre  25.13 %    Family Post  28 %    Family % Change  11.42 %    GLOBAL Pre  24.59 %    GLOBAL Post  26.73 %    GLOBAL % Change  8.7 %       Personal Goals: Goals established at orientation with interventions provided to work toward goal. Personal Goals and Risk Factors at Admission - 03/09/17 1441      Core Components/Risk Factors/Patient Goals on Admission    Weight Management  Yes    Admit Weight  184 lb 1.6 oz (83.5 kg)    Goal Weight: Short Term  179 lb 1.6 oz (81.2 kg)    Goal Weight: Long Term  174 lb 1.6 oz (79 kg)    Expected Outcomes  Short Term: Continue  to assess and modify interventions until short term weight is achieved;Long Term: Adherence to nutrition and physical activity/exercise program aimed toward attainment of established weight goal    Personal Goal Other  Yes    Personal Goal  Breathe better, lose 10 lbs    Intervention  Attend CR 3 x week and supplement with home exercise 2 x week.    Expected Outcomes  Achieve personal goals.         Personal Goals Discharge: Goals and Risk Factor Review    Row Name 03/29/17 1357 04/26/17 1336 05/15/17 1402 06/12/17 1101 07/14/17 1401     Core Components/Risk Factors/Patient Goals Review   Personal Goals Review  Weight Management/Obesity;Improve shortness of breath with ADL's Lose weight; get stronger; continue to breathe better.   Weight Management/Obesity;Improve shortness of breath with ADL's;Diabetes Lose weight; get  stronger; continue to breathe better.   Weight Management/Obesity;Improve shortness of breath with ADL's;Diabetes Lose weight; get stronger; continue to breathe better.   Weight Management/Obesity;Improve shortness of breath with ADL's;Diabetes Lose weight; get stronger; continue to breathe better.   Weight Management/Obesity;Improve shortness of breath with ADL's;Diabetes Breathe again; lose weight; continue to breathe better.    Review  Patient has completed 8 sessions gaining 4 lbs. He has cut down on the amount of sweets he is eating. He blames his weight gain on taking prednisone for his arthritis. He hopes to get off of medication soon. He says his appetite has improved so he is eating more. He says he feels better and is getting some stronger. He is able to do more around the house. Will continue to monitor.   Patient has completed 18 sessions maintaining his weight since last 30 day review. He is still on Prednisone with a tappering dosage. He is down to 4 mg daily. He believes the Prednisone is impeding his weight loss. His last A1C was 6.4 08/18/16. He is doing well in the program with progression. He says his SOB continues to improve with activity. He says he has more energy. 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 He says the program is helping his overall. Will continue to monitor.   Patient has completed 23 sessions maintaining his weight since last 30 day review. He continues to do well in the program with progression. He startes he feels stronger and is breathing better since he started. He says he is able to do more now with more energy. 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 Will continue to monitor.   Patient has completed 32 sessions maintaining his weight since last 30 day review. He continues to do well in the program with progression. His last A1C was 08/18/16 6.4. He says he continues to feel  stronger and is breathing better since. 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 He did have an episode of chest pain 2 weeks ago on a Sunday. He saw his cardiologist the following week. MD told him if he has another episode he would recommend a catheterization. Will continue to monitor.   Patient graduated with 36 sessions. He gained 4 lbs overall but decreased 1 inch in his hip size. He did well in the program saying he feels much better and is back to breathing "good" again. 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 He says he is exercising at the [facility] 3 days/week. His exit measurements improved in grip strength, flexibility and balance. His exit walk test improved by 250 ft.    Expected Outcomes  Patient will complete the program and continue to work toward meeting his personal goals.   Patient will complete the program and continue to meet his personal goals.   Patient will complete the program and continue to meet his personal goals.   Patient will complete the program and continue to meet his personal goals.   Patient plans to continue exercising at the [facility] and continue to meet his personal goals. CR will f/u for 1 year.       Exercise Goals and Review: Exercise Goals    Row Name 03/09/17 1404             Exercise Goals   Increase Physical Activity  Yes       Intervention  Provide advice, education, support and counseling about physical activity/exercise needs.;Develop an individualized exercise prescription for aerobic and resistive training based on initial evaluation findings, risk stratification, comorbidities and participant's personal goals.       Expected Outcomes  Achievement of increased cardiorespiratory fitness and enhanced flexibility, muscular endurance and strength shown through measurements of functional capacity and personal statement of participant.       Increase Strength and  Stamina  Yes       Intervention  Provide advice, education, support and counseling about physical activity/exercise needs.;Develop an individualized exercise prescription for aerobic and resistive training based on initial evaluation findings, risk stratification, comorbidities and participant's personal goals.       Expected Outcomes  Achievement of increased cardiorespiratory fitness and enhanced flexibility, muscular endurance and strength shown through measurements of functional capacity and personal statement of participant.       Able to understand and use rate of perceived exertion (RPE) scale  Yes       Intervention  Provide education and explanation on how to use RPE scale       Expected Outcomes  Short Term: Able to use RPE daily in rehab to express subjective intensity level;Long Term:  Able to use RPE to guide intensity level when exercising independently       Able to understand and use Dyspnea scale  Yes       Intervention  Provide education and explanation on how to use Dyspnea scale       Expected Outcomes  Short Term: Able to use Dyspnea scale daily in rehab to express subjective sense of shortness of breath during exertion;Long Term: Able to use Dyspnea scale to guide intensity level when exercising independently       Knowledge and understanding of Target Heart Rate Range (THRR)  Yes       Intervention  Provide education and explanation of THRR including how the numbers were predicted and where they are located for reference       Expected Outcomes  Short Term: Able to state/look up THRR;Short Term: Able to use daily as guideline for intensity in rehab;Long Term: Able to use THRR to govern intensity when exercising independently       Able to check pulse independently  Yes       Intervention  Provide education and demonstration on how to check pulse in carotid and radial arteries.;Review the importance of being able to check your own pulse for safety during independent exercise        Expected  Outcomes  Short Term: Able to explain why pulse checking is important during independent exercise;Long Term: Able to check pulse independently and accurately       Understanding of Exercise Prescription  Yes       Intervention  Provide education, explanation, and written materials on patient's individual exercise prescription       Expected Outcomes  Short Term: Able to explain program exercise prescription;Long Term: Able to explain home exercise prescription to exercise independently          Nutrition & Weight - Outcomes: Pre Biometrics - 03/09/17 1404      Pre Biometrics   Height  '5\' 9"'$  (1.753 m)    Weight  184 lb 1.6 oz (83.5 kg)    Waist Circumference  37 inches    Hip Circumference  39 inches    Waist to Hip Ratio  0.95 %    BMI (Calculated)  27.17    Triceps Skinfold  7 mm    % Body Fat  22.8 %    Grip Strength  72.27 kg    Flexibility  10.67 in    Single Leg Stand  7 seconds      Post Biometrics - 07/04/17 1251       Post  Biometrics   Height  '5\' 9"'$  (1.753 m)    Weight  188 lb 9.6 oz (85.5 kg)    Waist Circumference  38 inches    Hip Circumference  38 inches    Waist to Hip Ratio  1 %    BMI (Calculated)  27.84    Triceps Skinfold  7 mm    % Body Fat  23.6 %    Grip Strength  65.4 kg    Flexibility  12.33 in    Single Leg Stand  28 seconds       Nutrition: Nutrition Therapy & Goals - 06/12/17 1101      Nutrition Therapy   RD appointment deferred  Yes      Personal Nutrition Goals   Personal Goal #2  Continue to eat a heart healthy diet.     Additional Goals?  No    Comments  Patient says he is eating a heart healthy diet.       Intervention Plan   Intervention  Nutrition handout(s) given to patient.    Expected Outcomes  Short Term Goal: Understand basic principles of dietary content, such as calories, fat, sodium, cholesterol and nutrients.       Nutrition Discharge: Nutrition Assessments - 07/14/17 1357      MEDFICTS Scores   Pre Score   41    Post Score  28    Score Difference  -13       Education Questionnaire Score: Knowledge Questionnaire Score - 07/14/17 1356      Knowledge Questionnaire Score   Pre Score  19/24    Post Score  20/24       Goals reviewed with patient; copy given to patient.

## 2017-07-14 NOTE — Addendum Note (Signed)
Encounter addended by: Dwana Melena, RN on: 07/14/2017 2:11 PM  Actions taken: Flowsheet data copied forward, Visit Navigator Flowsheet section accepted, Sign clinical note, Episode resolved

## 2017-07-14 NOTE — Progress Notes (Signed)
Cardiac Individual Treatment Plan  Patient Details  Name: Caleb Butler MRN: [id number] Date of Birth: 1945/10/17 Referring Provider:     CARDIAC REHAB PHASE II ORIENTATION from 03/09/2017 in [facility]  Referring Provider  [name]      Initial Encounter Date:    CARDIAC REHAB PHASE II ORIENTATION from 03/09/2017 in [facility]  Date  03/09/17  Referring Provider  [name]      Visit Diagnosis: Status post coronary artery stent placement  Dyspnea and respiratory abnormalities  Patient's Home Medications on Admission:  Current Outpatient Medications:  .  acetaminophen (TYLENOL) 325 MG tablet, Take 650 mg by mouth every 4 (four) hours as needed for mild pain. , Disp: , Rfl:  .  aspirin 81 MG EC tablet, Take 81 mg by mouth at bedtime. , Disp: , Rfl:  .  clopidogrel (PLAVIX) 75 MG tablet, Take 1 tablet (75 mg total) by mouth daily., Disp: 90 tablet, Rfl: 3 .  glucose blood test strip, Check BS BID Dx: E11.9, Disp: 100 each, Rfl: 5 .  Lancets (ACCU-CHEK MULTICLIX) lancets, Check BS BID Dx: E11.9, Disp: 100 each, Rfl: 12 .  levothyroxine (SYNTHROID, LEVOTHROID) 100 MCG tablet, Take 1 tablet (100 mcg total) by mouth daily before breakfast., Disp: 90 tablet, Rfl: 3 .  metFORMIN (GLUCOPHAGE) 1000 MG tablet, Take 1 tablet (1,000 mg total) by mouth 2 (two) times daily with a meal., Disp: 180 tablet, Rfl: 3 .  Multiple Vitamin (MULTIVITAMIN WITH MINERALS) TABS tablet, Take 1 tablet by mouth daily., Disp: , Rfl:  .  predniSONE (DELTASONE) 5 MG tablet, Take 4 mg by mouth daily with breakfast. Take with #2 of the 1 mg tablets daily, Disp: , Rfl:  .  rosuvastatin (CRESTOR) 20 MG tablet, Take 1 tablet (20 mg total) at bedtime by mouth., Disp: 90 tablet, Rfl: 3 .  sitaGLIPtin (JANUVIA) 100 MG tablet, Take 1 tablet (100 mg total) by mouth daily., Disp: 90 tablet, Rfl: 1 .  vitamin B-12 (CYANOCOBALAMIN) 1000 MCG tablet, Take 1,000 mcg by  mouth daily., Disp: , Rfl:   Past Medical History: Past Medical History:  Diagnosis Date  . Anxiety   . Arthritis    "my back is loaded w/it" (01/06/2017)  . CAD (coronary artery disease)    80% ostial lesion in L circumflex (2017)  . Chronic lower back pain   . GERD (gastroesophageal reflux disease)   . Hiatal hernia   . High cholesterol   . Hypothyroidism   . IBS (irritable bowel syndrome)   . Numbness and tingling of right lower extremity    "since 11/30/2016" (01/06/2017)  . PMR (polymyalgia rheumatica) (HCC)   . Type 2 diabetes mellitus (HCC)     Tobacco Use: Social History   Tobacco Use  Smoking Status Never Smoker  Smokeless Tobacco Never Used    Labs: Recent Review Flowsheet Data    Labs for ITP Cardiac and Pulmonary Rehab Latest Ref Rng & Units 11/30/2015 07/07/2016 07/07/2016 07/07/2016 08/18/2016   Cholestrol 125 - 200 mg/dL 140 - - - -   LDLCALC <130 mg/dL 57 - - - -   HDL >=40 mg/dL 41 - - - -   Trlycerides <150 mg/dL 210(H) - - - -   Hemoglobin A1c <5.7 % 7.3(H) - - - 6.4(H)   PHART 7.350 - 7.450 - 7.382 - - -   PCO2ART 32.0 - 48.0 mmHg - 40.7 - - -   HCO3 20.0 - 28.0  mmol/L - 24.2 21.2 24.1 -   TCO2 0 - 100 mmol/L - _0 -   ACIDBASEDEF 0.0 - 2.0 mmol/L - 1.0 5.0(H) 2.0 -   O2SAT % - 95.0 68.0 69.0 -      Capillary Blood Glucose: Lab Results  Component Value Date   GLUCAP 167 (H) 01/07/2017   GLUCAP 178 (H) 01/06/2017   GLUCAP 249 (H) 01/06/2017   GLUCAP 250 (H) 01/06/2017   GLUCAP 197 (H) 01/06/2017     Exercise Target Goals:    Exercise Program Goal: Individual exercise prescription set using results from initial 6 min walk test and THRR while considering  patient's activity barriers and safety.   Exercise Prescription Goal: Starting with aerobic activity 30 plus minutes a day, 3 days per week for initial exercise prescription. Provide home exercise prescription and guidelines that participant acknowledges understanding prior to  discharge.  Activity Barriers & Risk Stratification: Activity Barriers & Cardiac Risk Stratification - 03/09/17 1401      Activity Barriers & Cardiac Risk Stratification   Activity Barriers  Joint Problems;Shortness of Breath    Cardiac Risk Stratification  Moderate Moderate to High       6 Minute Walk: 6 Minute Walk    Row Name 03/09/17 1400 07/04/17 1251       6 Minute Walk   Phase  Initial  Discharge    Distance  1250 feet  1500 feet    Distance % Change  0 %  0 %    Distance Feet Change  0 ft  0 ft    Walk Time  6 minutes  6 minutes    # of Rest Breaks  0  0    MPH  2.36  2.84    METS  2.81  3.17    RPE  9  12    Perceived Dyspnea   9  14    VO2 Peak  9.28  10.97    Symptoms  No  No    Resting HR  81 bpm  96 bpm    Resting BP  102/62  100/60    Resting Oxygen Saturation   96 %  96 %    Exercise Oxygen Saturation  during 6 min walk  96 %  97 %    Max Ex. HR  88 bpm  97 bpm    Max Ex. BP  116/64  118/70    2 Minute Post BP  104/62  108/66       Oxygen Initial Assessment:   Oxygen Re-Evaluation:   Oxygen Discharge (Final Oxygen Re-Evaluation):   Initial Exercise Prescription: Initial Exercise Prescription - 03/09/17 1400      Date of Initial Exercise RX and Referring Provider   Date  03/09/17    Referring Provider  [name]      Treadmill   MPH  2    Grade  0    Minutes  15    METs  2.53      Recumbant Elliptical   Level  1    RPM  79    Watts  46    Minutes  1.7    METs  3.4      Prescription Details   Frequency (times per week)  3    Duration  Progress to 30 minutes of continuous aerobic without signs/symptoms of physical distress      Intensity   THRR 40-80% of Max Heartrate  [phone]    Ratings  of Perceived Exertion  11-13    Perceived Dyspnea  0-4      Progression   Progression  Continue progressive overload as per policy without signs/symptoms or physical distress.      Resistance Training   Training Prescription  Yes     Weight  1    Reps  10-15       Perform Capillary Blood Glucose checks as needed.  Exercise Prescription Changes:  Exercise Prescription Changes    Row Name 03/23/17 0700 03/23/17 1500 04/11/17 1500 04/25/17 0700 05/16/17 0700     Response to Exercise   Blood Pressure (Admit)  -  100/58  100/62  100/62  112/64   Blood Pressure (Exercise)  -  100/60  120/68  118/60  118/66   Blood Pressure (Exit)  -  100/60  106/60  98/56  108/64   Heart Rate (Admit)  -  86 bpm  84 bpm  79 bpm  78 bpm   Heart Rate (Exercise)  -  106 bpm  109 bpm  112 bpm  103 bpm   Heart Rate (Exit)  -  86 bpm  94 bpm  90 bpm  85 bpm   Rating of Perceived Exertion (Exercise)  -  _0 Duration  -  Progress to 30 minutes of  aerobic without signs/symptoms of physical distress  Progress to 30 minutes of  aerobic without signs/symptoms of physical distress  Progress to 30 minutes of  aerobic without signs/symptoms of physical distress  Progress to 30 minutes of  aerobic without signs/symptoms of physical distress   Intensity  -  THRR New [phone]  THRR New [phone]  THRR New [phone]  THRR New [phone]     Progression   Progression  -  Continue to progress workloads to maintain intensity without signs/symptoms of physical distress.  Continue to progress workloads to maintain intensity without signs/symptoms of physical distress.  Continue to progress workloads to maintain intensity without signs/symptoms of physical distress.  Continue to progress workloads to maintain intensity without signs/symptoms of physical distress.     Resistance Training   Training Prescription  Yes  Yes  Yes  Yes  Yes   Weight  _1 Reps  10-15  10-15  10-15  10-15  10-15     Treadmill   MPH  2  2.2  2.7  2.8  3   Grade  0  0  0  0  0.05   Minutes  _2 METs  2.53  2.6  3.1  3.1  3.4     Recumbant Elliptical   Level  _3 RPM  79  61  63  60  63   Watts  46  79  80  77  90    Minutes  1._4 METs  3.4  4  4.2  4.2  5.2     Home Exercise Plan   Plans to continue exercise at  Home (comment)  Home (comment)  Home (comment)  Home (comment)  Home (comment)   Frequency  Add 2 additional days to program exercise sessions.  Add 2 additional days to program exercise sessions.  Add 2 additional days to program exercise sessions.  Add 2 additional days  to program exercise sessions.  Add 2 additional days to program exercise sessions.   Initial Home Exercises Provided  03/22/17  03/22/17  03/22/17  03/22/17  03/22/17   Row Name 05/29/17 1000 06/09/17 1400           Response to Exercise   Blood Pressure (Admit)  104/64  118/62      Blood Pressure (Exercise)  110/64  140/70      Blood Pressure (Exit)  100/62  100/62      Heart Rate (Admit)  80 bpm  74 bpm      Heart Rate (Exercise)  107 bpm  110 bpm      Heart Rate (Exit)  87 bpm  84 bpm      Rating of Perceived Exertion (Exercise)  12  12      Duration  Progress to 30 minutes of  aerobic without signs/symptoms of physical distress  Progress to 30 minutes of  aerobic without signs/symptoms of physical distress      Intensity  THRR New [phone]  THRR New [phone]        Progression   Progression  Continue to progress workloads to maintain intensity without signs/symptoms of physical distress.  Continue to progress workloads to maintain intensity without signs/symptoms of physical distress.        Resistance Training   Training Prescription  Yes  Yes      Weight  5  5      Reps  10-15  10-15        Treadmill   MPH  3.1  3.2      Grade  0.5  0.5      Minutes  15  15      METs  3.5  3.7        Recumbant Elliptical   Level  3  3      RPM  61  72      Watts  92  95      Minutes  20  20      METs  4.6  5.2        Home Exercise Plan   Plans to continue exercise at  Home (comment)  Home (comment)      Frequency  Add 2 additional days to program exercise sessions.  Add 2 additional days to  program exercise sessions.      Initial Home Exercises Provided  03/22/17  03/22/17         Exercise Comments:  Exercise Comments    Row Name 03/13/17 1438 03/23/17 0736 03/23/17 1522 04/25/17 0801 05/16/17 0801   Exercise Comments  Patient has not yet started CR and will be progressed in time.   Patient received the take home exercise plan this day 03/22/2017. THR was addressed as were safe ways of staying active when not in CR. Patient demonstrated an understanding and was encouraged to ask any questions in the future.   Patient is doing well in CR. He is getting back to where he was when he left [facility]. 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  Patient is doing well in CR. He has increased his speed on the treadmill and is maintaining his levels and watts on the elliptical. He is still veru active outside of CR, working on a farm. He state sthat this program is helping him.  Patient is doing well in CR.  He has progressed on both machines and has increased his SPMs on the elliptical and speed on the treadmill. 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   [employer] Name 05/29/17 1029           Exercise Comments  Patient is doing well in CR and has increased speed on the treadmill as well as SPMs on the Elliptical. Patient states that this has helped him gained strength and stamina for on the farm and at work.           Exercise Goals and Review:  Exercise Goals    Row Name 03/09/17 1404             Exercise Goals   Increase Physical Activity  Yes       Intervention  Provide advice, education, support and counseling about physical activity/exercise needs.;Develop an individualized exercise prescription for aerobic and resistive training based on initial evaluation findings, risk stratification, comorbidities and participant's personal goals.       Expected  Outcomes  Achievement of increased cardiorespiratory fitness and enhanced flexibility, muscular endurance and strength shown through measurements of functional capacity and personal statement of participant.       Increase Strength and Stamina  Yes       Intervention  Provide advice, education, support and counseling about physical activity/exercise needs.;Develop an individualized exercise prescription for aerobic and resistive training based on initial evaluation findings, risk stratification, comorbidities and participant's personal goals.       Expected Outcomes  Achievement of increased cardiorespiratory fitness and enhanced flexibility, muscular endurance and strength shown through measurements of functional capacity and personal statement of participant.       Able to understand and use rate of perceived exertion (RPE) scale  Yes       Intervention  Provide education and explanation on how to use RPE scale       Expected Outcomes  Short Term: Able to use RPE daily in rehab to express subjective intensity level;Long Term:  Able to use RPE to guide intensity level when exercising independently       Able to understand and use Dyspnea scale  Yes       Intervention  Provide education and explanation on how to use Dyspnea scale       Expected Outcomes  Short Term: Able to use Dyspnea scale daily in rehab to express subjective sense of shortness of breath during exertion;Long Term: Able to use Dyspnea scale to guide intensity level when exercising independently       Knowledge and understanding of Target Heart Rate Range (THRR)  Yes       Intervention  Provide education and explanation of THRR including how the numbers were predicted and where they are located for reference       Expected Outcomes  Short Term: Able to state/look up THRR;Short Term: Able to use daily as guideline for intensity in rehab;Long Term: Able to use THRR to govern intensity when exercising independently       Able to check  pulse independently  Yes       Intervention  Provide education and demonstration on how to check pulse in carotid and radial arteries.;Review the importance of being able to check your own pulse for safety during independent exercise       Expected Outcomes  Short Term: Able to explain why pulse checking is important during independent exercise;Long  Term: Able to check pulse independently and accurately       Understanding of Exercise Prescription  Yes       Intervention  Provide education, explanation, and written materials on patient's individual exercise prescription       Expected Outcomes  Short Term: Able to explain program exercise prescription;Long Term: Able to explain home exercise prescription to exercise independently          Exercise Goals Re-Evaluation : Exercise Goals Re-Evaluation    Row Name 03/23/17 1521 04/25/17 0759 05/16/17 0800 06/09/17 1417       Exercise Goal Re-Evaluation   Exercise Goals Review  Increase Physical Activity;Increase Strength and Stamina;Knowledge and understanding of Target Heart Rate Range (THRR)  Increase Strength and Stamina;Increase Physical Activity;Knowledge and understanding of Target Heart Rate Range (THRR)  Increase Physical Activity;Increase Strength and Stamina;Knowledge and understanding of Target Heart Rate Range (THRR)  Increase Physical Activity;Increase Strength and Stamina;Knowledge and understanding of Target Heart Rate Range (THRR)    Comments  Patient is doing well in CR. He is getting back to where he was when he left [place]. 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   Patient is doing well in CR. He has increased his speed on the treadmill and is maintaining his levels and watts on the elliptical. He is still veru active outside of CR, working on a farm. He state sthat this program is helping him.   Patient is doing well in CR. He has progressed on both machines and has  increased his SPMs on the elliptical and speed on the treadmill. 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   Patient is doing well in CR. He has increased his speed on the treadmill to 3.2 while maintaining a slight incline. Patient states that he continues to feel progress while he is home and on the farm.     Expected Outcomes  Patient wishes to breathe easier and to lose weight.   Patient wishes to breathe better and to lose weight over time.   Patient wishes to breathe easier and to lose weight.   Patient wishes to breathe easier and to lose weight         Discharge Exercise Prescription (Final Exercise Prescription Changes): Exercise Prescription Changes - 06/09/17 1400      Response to Exercise   Blood Pressure (Admit)  118/62    Blood Pressure (Exercise)  140/70    Blood Pressure (Exit)  100/62    Heart Rate (Admit)  74 bpm    Heart Rate (Exercise)  110 bpm    Heart Rate (Exit)  84 bpm    Rating of Perceived Exertion (Exercise)  12    Duration  Progress to 30 minutes of  aerobic without signs/symptoms of physical distress    Intensity  THRR New [phone]      Progression   Progression  Continue to progress workloads to maintain intensity without signs/symptoms of physical distress.      Resistance Training   Training Prescription  Yes    Weight  5    Reps  10-15      Treadmill   MPH  3.2    Grade  0.5    Minutes  15    METs  3.7      Recumbant Elliptical   Level  3    RPM  72    Watts  95    Minutes  20    METs  5.2      Home Exercise Plan   Plans to continue exercise at  Home (comment)    Frequency  Add 2 additional days to program exercise sessions.    Initial Home Exercises Provided  03/22/17       Nutrition:  Target Goals: Understanding of nutrition guidelines, daily intake of sodium <1526m, cholesterol <2075m calories 30% from fat and 7% or less from saturated fats, daily to have 5 or  more servings of fruits and vegetables.  Biometrics: Pre Biometrics - 03/09/17 1404      Pre Biometrics   Height  _0  (1.753 m)    Weight  184 lb 1.6 oz (83.5 kg)    Waist Circumference  37 inches    Hip Circumference  39 inches    Waist to Hip Ratio  0.95 %    BMI (Calculated)  27.17    Triceps Skinfold  7 mm    % Body Fat  22.8 %    Grip Strength  72.27 kg    Flexibility  10.67 in    Single Leg Stand  7 seconds      Post Biometrics - 07/04/17 1251       Post  Biometrics   Height  _1  (1.753 m)    Weight  188 lb 9.6 oz (85.5 kg)    Waist Circumference  38 inches    Hip Circumference  38 inches    Waist to Hip Ratio  1 %    BMI (Calculated)  27.84    Triceps Skinfold  7 mm    % Body Fat  23.6 %    Grip Strength  65.4 kg    Flexibility  12.33 in    Single Leg Stand  28 seconds       Nutrition Therapy Plan and Nutrition Goals: Nutrition Therapy & Goals - 06/12/17 1101      Nutrition Therapy   RD appointment deferred  Yes      Personal Nutrition Goals   Personal Goal #2  Continue to eat a heart healthy diet.     Additional Goals?  No    Comments  Patient says he is eating a heart healthy diet.       Intervention Plan   Intervention  Nutrition handout(s) given to patient.    Expected Outcomes  Short Term Goal: Understand basic principles of dietary content, such as calories, fat, sodium, cholesterol and nutrients.       Nutrition Assessments: Nutrition Assessments - 07/14/17 1357      MEDFICTS Scores   Pre Score  41    Post Score  28    Score Difference  -13       Nutrition Goals Re-Evaluation: Nutrition Goals Re-Evaluation    Row Name 03/29/17 1401             Goals   Current Weight  188 lb 3.2 oz (85.4 kg)       Comment  Patient continues to try to eat heart healthy. He has stopped eating sweets. He is on prednisone for arthritis which he attributes his weight gain to. He hopes to stop medication soon.        Expected Outcome  Patient will  continue to work toward meeting his nurtition goal.          Personal Goal #2  Re-Evaluation   Personal Goal #2  Continue to eat a heart healthy diet.           Nutrition Goals Discharge (Final Nutrition Goals Re-Evaluation): Nutrition Goals Re-Evaluation - 03/29/17 1401      Goals   Current Weight  188 lb 3.2 oz (85.4 kg)    Comment  Patient continues to try to eat heart healthy. He has stopped eating sweets. He is on prednisone for arthritis which he attributes his weight gain to. He hopes to stop medication soon.     Expected Outcome  Patient will continue to work toward meeting his nurtition goal.       Personal Goal #2 Re-Evaluation   Personal Goal #2  Continue to eat a heart healthy diet.        Psychosocial: Target Goals: Acknowledge presence or absence of significant depression and/or stress, maximize coping skills, provide positive support system. Participant is able to verbalize types and ability to use techniques and skills needed for reducing stress and depression.  Initial Review & Psychosocial Screening: Initial Psych Review & Screening - 03/09/17 1444      Initial Review   Current issues with  None Identified      Family Dynamics   Good Support System?  Yes      Barriers   Psychosocial barriers to participate in program  There are no identifiable barriers or psychosocial needs.      Screening Interventions   Interventions  Encouraged to exercise       Quality of Life Scores: Quality of Life - 07/13/17 1443      Quality of Life Scores   Health/Function Pre  23.2 %    Health/Function Post  26.47 %    Health/Function % Change  14.09 %    Socioeconomic Pre  27.56 %    Socioeconomic Post  28 %    Socioeconomic % Change   1.6 %    Psych/Spiritual Pre  23.86 %    Psych/Spiritual Post  25.71 %    Psych/Spiritual % Change  7.75 %    Family Pre  25.13 %    Family Post  28 %    Family % Change  11.42 %    GLOBAL Pre  24.59 %    GLOBAL Post  26.73 %     GLOBAL % Change  8.7 %      Scores of 19 and below usually indicate a poorer quality of life in these areas.  A difference of  2-3 points is a clinically meaningful difference.  A difference of 2-3 points in the total score of the Quality of Life Index has been associated with significant improvement in overall quality of life, self-image, physical symptoms, and general health in studies assessing change in quality of life.  PHQ-9: Recent Review Flowsheet Data    Depression screen [facility] 2/9 07/14/2017 03/09/2017 09/06/2016 08/18/2016 01/18/2016   Decreased Interest 0 0 1 0 0   Down, Depressed, Hopeless 0 0 1 0 0   PHQ - 2 Score 0 0 2 0 0   Altered sleeping _0 0 -   Tired, decreased energy _1 0 -   Change in appetite 0 1 2 0 -   Feeling bad or failure about yourself  0 0 0 0 -   Trouble concentrating _2 0 -   Moving slowly or fidgety/restless _3 0 -   Suicidal thoughts 0 0 0 0 -  PHQ-9 Score _0 0 -   Difficult doing work/chores Not difficult at all Not difficult at all Somewhat difficult Not difficult at all -     Interpretation of Total Score  Total Score Depression Severity:  1-4 = Minimal depression, 5-9 = Mild depression, 10-14 = Moderate depression, 15-19 = Moderately severe depression, 20-27 = Severe depression   Psychosocial Evaluation and Intervention: Psychosocial Evaluation - 07/14/17 1406      Discharge Psychosocial Assessment & Intervention   Comments  Patient has no psychosocial barriers identified at discharge. His exit QOL score improved by 8.7% at 26.73% and his PHQ-9 score improved by 3 at 4.       Psychosocial Re-Evaluation: Psychosocial Re-Evaluation    [place] Name 03/29/17 1403 04/26/17 1345 05/15/17 1405 06/12/17 1115       Psychosocial Re-Evaluation   Current issues with  Current Depression  None Identified  None Identified  None Identified    Comments  Patient is not interested in treatment.   -  -  Patient is not interested in treatment.      Expected Outcomes  Patient's QOL score was 24.59 and his PHQ-9 score was 7.  Patient will have no psychosocial barriers identified at discharge.   Patient will have no psychosocial barriers identified at discharge.   Patient will have no psychosocial barriers identified at discharge.     Interventions  Encouraged to attend Pulmonary Rehabilitation for the exercise;Stress management education;Relaxation education  -  Stress management education;Encouraged to attend Cardiac Rehabilitation for the exercise;Relaxation education  Stress management education;Encouraged to attend Cardiac Rehabilitation for the exercise;Relaxation education    Continue Psychosocial Services   Follow up required by staff  No Follow up required  No Follow up required  No Follow up required       Psychosocial Discharge (Final Psychosocial Re-Evaluation): Psychosocial Re-Evaluation - 06/12/17 1115      Psychosocial Re-Evaluation   Current issues with  None Identified    Comments  Patient is not interested in treatment.     Expected Outcomes  Patient will have no psychosocial barriers identified at discharge.     Interventions  Stress management education;Encouraged to attend Cardiac Rehabilitation for the exercise;Relaxation education    Continue Psychosocial Services   No Follow up required       Vocational Rehabilitation: Provide vocational rehab assistance to qualifying candidates.   Vocational Rehab Evaluation & Intervention: Vocational Rehab - 03/09/17 1415      Initial Vocational Rehab Evaluation & Intervention   Assessment shows need for Vocational Rehabilitation  No       Education: Education Goals: Education classes will be provided on a weekly basis, covering required topics. Participant will state understanding/return demonstration of topics presented.  Learning Barriers/Preferences: Learning Barriers/Preferences - 03/09/17 1414      Learning Barriers/Preferences   Learning Barriers  None     Learning Preferences  Skilled Demonstration;Verbal Instruction;Individual Instruction;Group Instruction       Education Topics: Hypertension, Hypertension Reduction -Define heart disease and high blood pressure. Discus how high blood pressure affects the body and ways to reduce high blood pressure.   CARDIAC REHAB PHASE II EXERCISE from 06/14/2017 in [place]  Date  04/05/17  Educator  [place]  Instruction Review Code  2- Demonstrated Understanding      Exercise and Your Heart -Discuss why it is important to exercise, the FITT principles of exercise, normal and abnormal responses to exercise, and how to exercise safely.  CARDIAC REHAB PHASE II EXERCISE from 06/14/2017 in [place]  Date  04/12/17  Educator  DC  Instruction Review Code  2- Demonstrated Understanding      Angina -Discuss definition of angina, causes of angina, treatment of angina, and how to decrease risk of having angina.   CARDIAC REHAB PHASE II EXERCISE from 06/14/2017 in [place]  Date  04/19/17  Educator  D. Coad  Instruction Review Code  2- Demonstrated Understanding      Cardiac Medications -Review what the following cardiac medications are used for, how they affect the body, and side effects that may occur when taking the medications.  Medications include Aspirin, Beta blockers, calcium channel blockers, ACE Inhibitors, angiotensin receptor blockers, diuretics, digoxin, and antihyperlipidemics.   CARDIAC REHAB PHASE II EXERCISE from 06/14/2017 in [place]  Date  04/26/17  Educator  DJ  Instruction Review Code  2- Demonstrated Understanding      Congestive Heart Failure -Discuss the definition of CHF, how to live with CHF, the signs and symptoms of CHF, and how keep track of weight and sodium intake.   CARDIAC REHAB PHASE II EXERCISE from 06/14/2017 in [place]  Date  05/03/17  Educator   DC  Instruction Review Code  2- Demonstrated Understanding      Heart Disease and Intimacy -Discus the effect sexual activity has on the heart, how changes occur during intimacy as we age, and safety during sexual activity.   Smoking Cessation / COPD -Discuss different methods to quit smoking, the health benefits of quitting smoking, and the definition of COPD.   CARDIAC REHAB PHASE II EXERCISE from 06/14/2017 in [place]  Date  05/17/17  Educator  DC  Instruction Review Code  2- Demonstrated Understanding      Nutrition I: Fats -Discuss the types of cholesterol, what cholesterol does to the heart, and how cholesterol levels can be controlled.   CARDIAC REHAB PHASE II EXERCISE from 06/14/2017 in [place]  Date  05/24/17  Educator  DC  Instruction Review Code  2- Demonstrated Understanding      Nutrition II: Labels -Discuss the different components of food labels and how to read food label   CARDIAC REHAB PHASE II EXERCISE from 06/14/2017 in [place]  Date  05/31/17  Educator  [place]  Instruction Review Code  2- Demonstrated Understanding      Heart Parts/Heart Disease and PAD -Discuss the anatomy of the heart, the pathway of blood circulation through the heart, and these are affected by heart disease.   Stress I: Signs and Symptoms -Discuss the causes of stress, how stress may lead to anxiety and depression, and ways to limit stress.   CARDIAC REHAB PHASE II EXERCISE from 06/14/2017 in [place]  Date  06/14/17  Educator  DJ  Instruction Review Code  2- Demonstrated Understanding      Stress II: Relaxation -Discuss different types of relaxation techniques to limit stress.   CARDIAC REHAB PHASE II EXERCISE from 06/14/2017 in [place]  Date  03/15/17  Educator  DJ  Instruction Review Code  2- Demonstrated Understanding      Warning Signs of Stroke  / TIA -Discuss definition of a stroke, what the signs and symptoms are of a stroke, and how to identify when someone is having stroke.   CARDIAC REHAB PHASE II EXERCISE from 06/14/2017 in [place]  Date  03/22/17  Educator  DC  Instruction Review Code  2- Demonstrated Understanding      Knowledge Questionnaire Score: Knowledge Questionnaire Score - 07/14/17 1356      Knowledge Questionnaire Score   Pre Score  19/24    Post Score  20/24       Core Components/Risk Factors/Patient Goals at Admission: Personal Goals and Risk Factors at Admission - 03/09/17 1441      Core Components/Risk Factors/Patient Goals on Admission    Weight Management  Yes    Admit Weight  184 lb 1.6 oz (83.5 kg)    Goal Weight: Short Term  179 lb 1.6 oz (81.2 kg)    Goal Weight: Long Term  174 lb 1.6 oz (79 kg)    Expected Outcomes  Short Term: Continue to assess and modify interventions until short term weight is achieved;Long Term: Adherence to nutrition and physical activity/exercise program aimed toward attainment of established weight goal    Personal Goal Other  Yes    Personal Goal  Breathe better, lose 10 lbs    Intervention  Attend CR 3 x week and supplement with home exercise 2 x week.    Expected Outcomes  Achieve personal goals.        Core Components/Risk Factors/Patient Goals Review:  Goals and Risk Factor Review    Row Name 03/29/17 1357 04/26/17 1336 05/15/17 1402 06/12/17 1101 07/14/17 1401     Core Components/Risk Factors/Patient Goals Review   Personal Goals Review  Weight Management/Obesity;Improve shortness of breath with ADL's Lose weight; get stronger; continue to breathe better.   Weight Management/Obesity;Improve shortness of breath with ADL's;Diabetes Lose weight; get stronger; continue to breathe better.   Weight Management/Obesity;Improve shortness of breath with ADL's;Diabetes Lose weight; get stronger; continue to breathe better.   Weight  Management/Obesity;Improve shortness of breath with ADL's;Diabetes Lose weight; get stronger; continue to breathe better.   Weight Management/Obesity;Improve shortness of breath with ADL's;Diabetes Breathe again; lose weight; continue to breathe better.    Review  Patient has completed 8 sessions gaining 4 lbs. He has cut down on the amount of sweets he is eating. He blames his weight gain on taking prednisone for his arthritis. He hopes to get off of medication soon. He says his appetite has improved so he is eating more. He says he feels better and is getting some stronger. He is able to do more around the house. Will continue to monitor.   Patient has completed 18 sessions maintaining his weight since last 30 day review. He is still on Prednisone with a tappering dosage. He is down to 4 mg daily. He believes the Prednisone is impeding his weight loss. His last A1C was 6.4 08/18/16. He is doing well in the program with progression. He says his SOB continues to improve with activity. He says he has more energy. 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 He says the program is helping his overall. Will continue to monitor.   Patient has completed 23 sessions maintaining his weight since last 30 day review. He continues to do well in the program with progression. He startes he feels stronger and is breathing better since he started. He says he is able to do more now with more energy. 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 Will continue to monitor.   Patient has completed 32 sessions maintaining his  weight since last 30 day review. He continues to do well in the program with progression. His last A1C was 08/18/16 6.4. He says he continues to feel stronger and is breathing better since. 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 He did have an episode of chest  pain 2 weeks ago on a Sunday. He saw his cardiologist the following week. MD told him if he has another episode he would recommend a catheterization. Will continue to monitor.   Patient graduated with 36 sessions. He gained 4 lbs overall but decreased 1 inch in his hip size. He did well in the program saying he feels much better and is back to breathing "good" again. 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 He says he is exercising at the [facility] 3 days/week. His exit measurements improved in grip strength, flexibility and balance. His exit walk test improved by 250 ft.    Expected Outcomes  Patient will complete the program and continue to work toward meeting his personal goals.   Patient will complete the program and continue to meet his personal goals.   Patient will complete the program and continue to meet his personal goals.   Patient will complete the program and continue to meet his personal goals.   Patient plans to continue exercising at the [facility] and continue to meet his personal goals. CR will f/u for 1 year.       Core Components/Risk Factors/Patient Goals at Discharge (Final Review):  Goals and Risk Factor Review - 07/14/17 1401      Core Components/Risk Factors/Patient Goals Review   Personal Goals Review  Weight Management/Obesity;Improve shortness of breath with ADL's;Diabetes Breathe again; lose weight; continue to breathe better.     Review  Patient graduated with 36 sessions. He gained 4 lbs overall but decreased 1 inch in his hip size. He did well in the program saying he feels much better and is back to breathing "good" again. 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 He says he is exercising at the [facility] 3 days/week. His exit measurements improved in grip strength, flexibility and balance. His exit walk test improved by 250 ft.     Expected Outcomes  Patient plans to continue exercising at the [facility] and continue to meet his personal goals.  CR will f/u for 1 year.        ITP Comments: ITP Comments    Row Name 03/09/17 1416           ITP Comments  Mr. No is coming back to the program after having stent placement. He finished our pulmonary program 6 months ago.           Comments: Patient graduated from [place] today on 06/23/17 after completing 36 sessions. He achieved LTG of 30 minutes of aerobic exercise at Max Met level of 5.8. All patients vitals are WNL. Patient has met with dietician. Discharge instruction has been reviewed in detail and patient stated an understanding of material given. Patient plans to continue exercising at the [facility]. Cardiac Rehab staff will make f/u calls at 1 month, 6 months, and 1 year. Patient had no complaints of any abnormal S/S or pain on their exit visit.

## 2017-08-02 IMAGING — DX DG HIP (WITH OR WITHOUT PELVIS) 3-4V BILAT
5 series · 5 of 5 positions shown · non-contrast
Comparison: None.

CLINICAL DATA: Polyarthralgia.

EXAM:
DG HIP (WITH OR WITHOUT PELVIS) 3-4V BILAT

[pelvis ap]
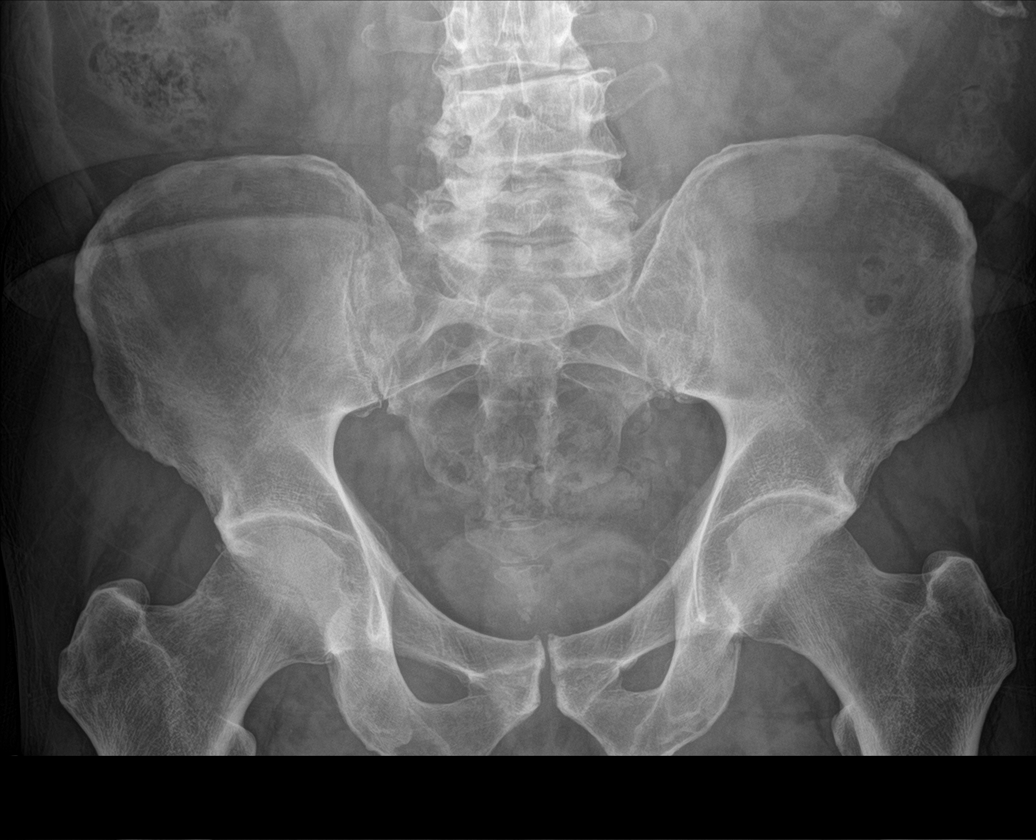

[hip ap (1 of 2)]
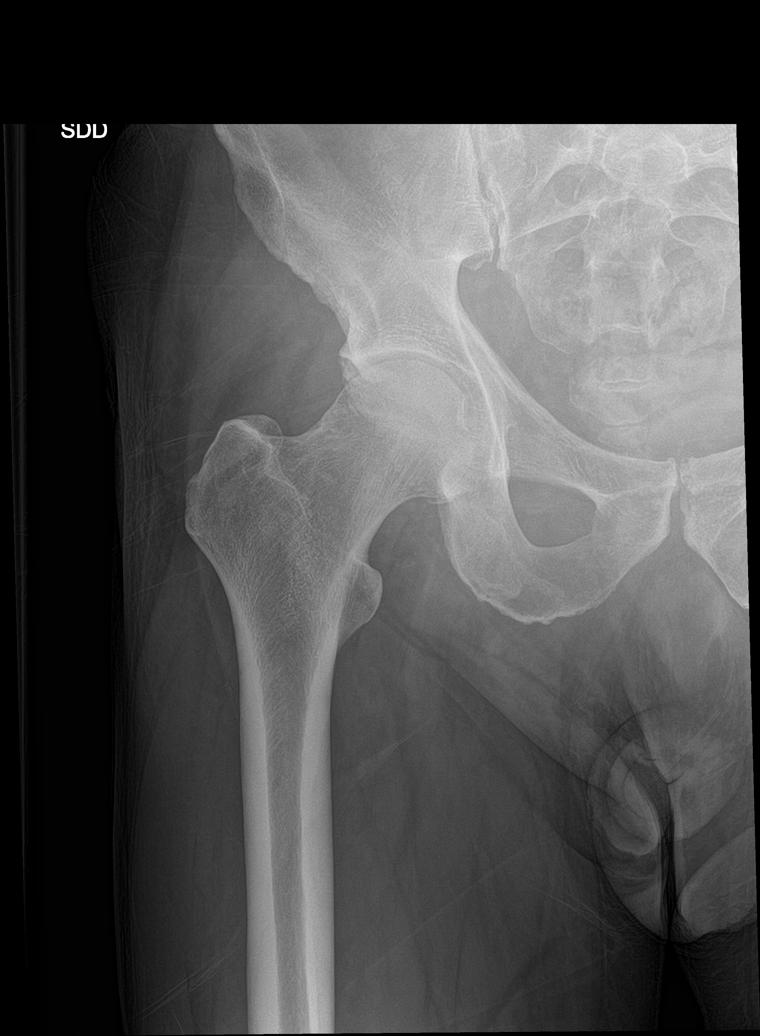

[hip lat (1 of 2)]
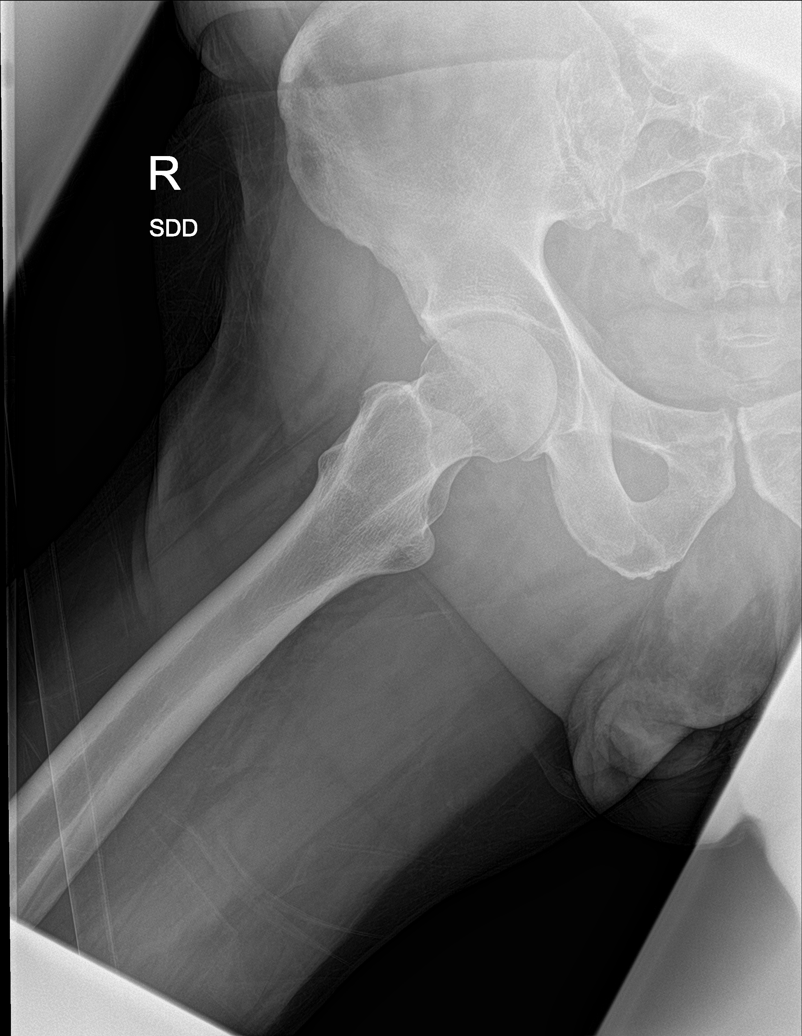

[hip ap (2 of 2)]
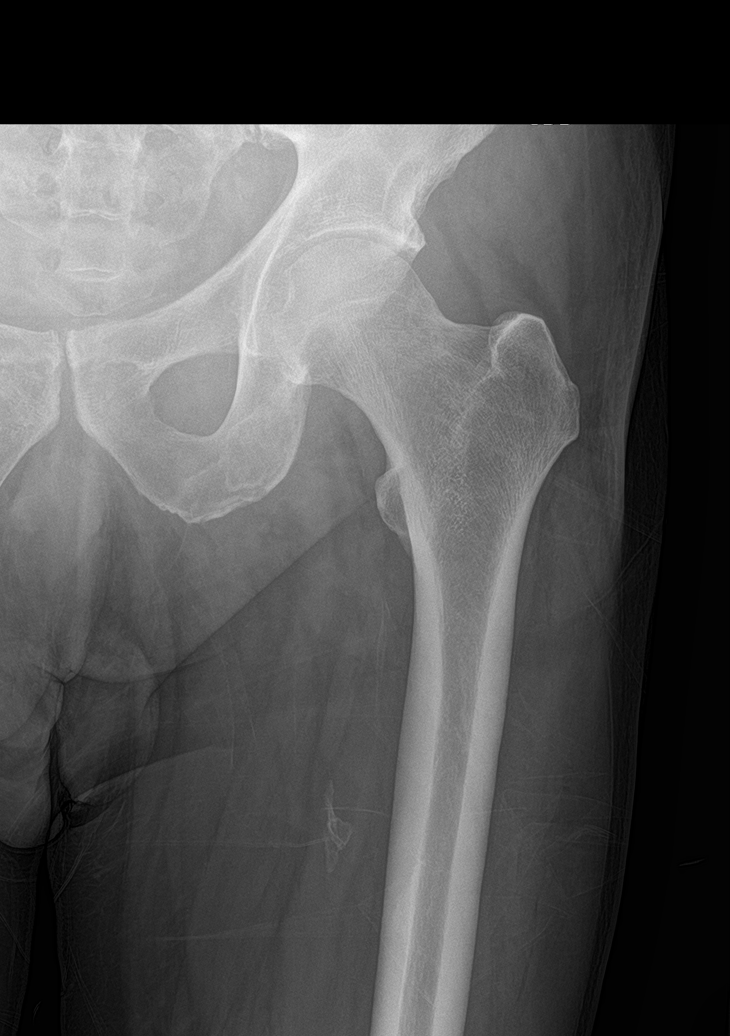

[hip lat (2 of 2)]
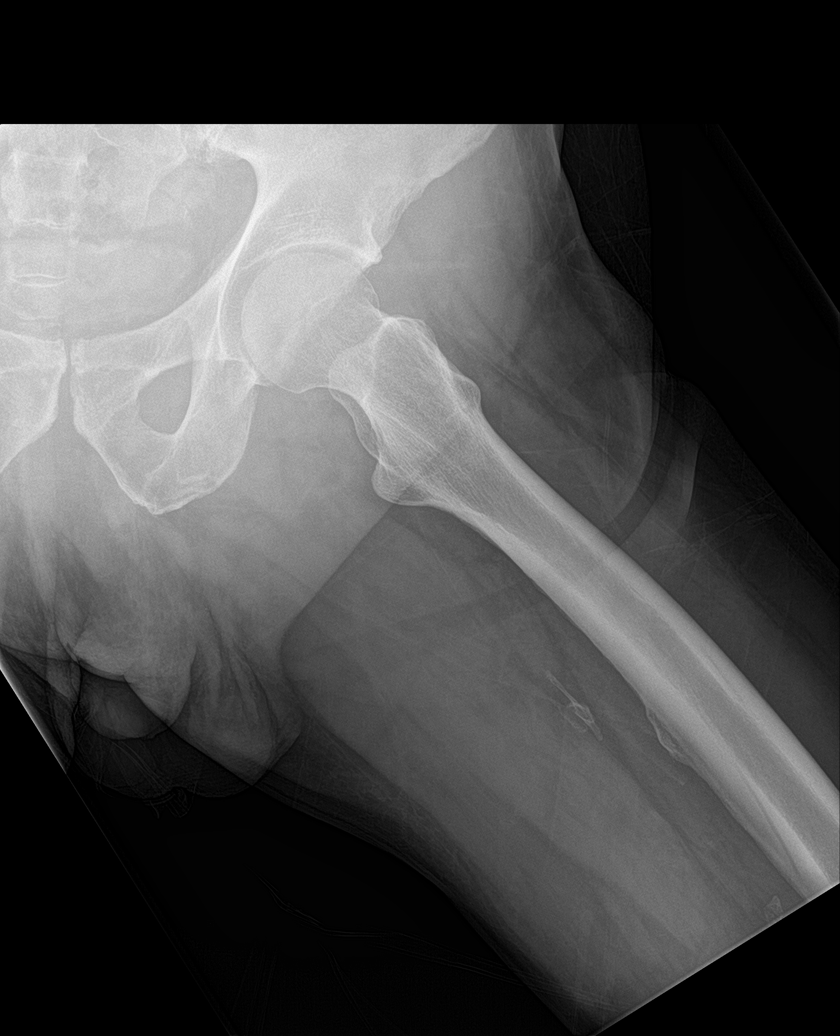

[5 of 5 positions shown; findings below may reference images not displayed]

FINDINGS: There is minimal osteophyte formation on the acetabuli. Joint spaces
are well preserved. Femoral heads appear normal. No appreciable
joint effusions.
IMPRESSION: Minimal arthritic changes of each hip.

## 2017-08-02 IMAGING — DX DG SHOULDER 2+V*R*
3 series · 3 of 3 positions shown · non-contrast
Comparison: None.

CLINICAL DATA: Polyarthralgia.

EXAM:
RIGHT SHOULDER - 2+ VIEW

[shoulder grashey]
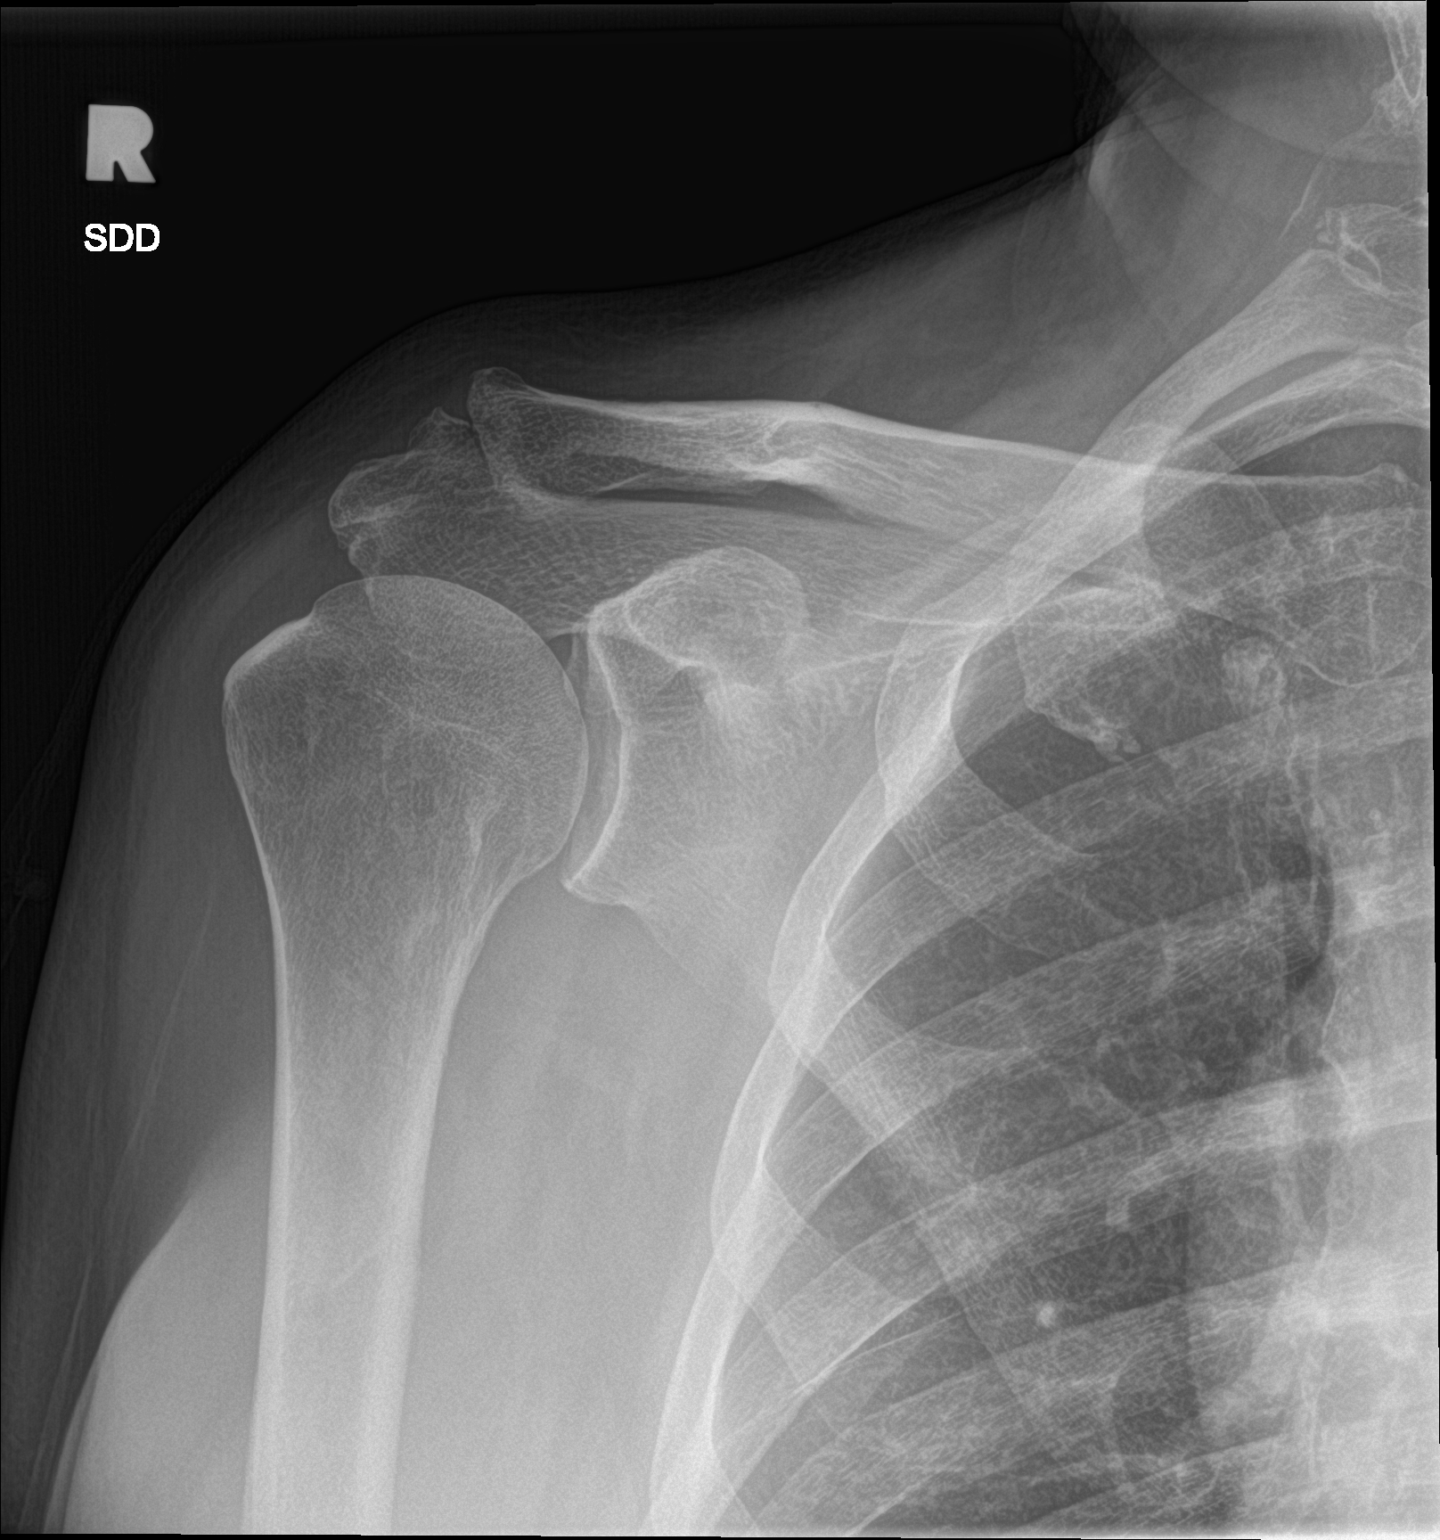

[shoulder y view]
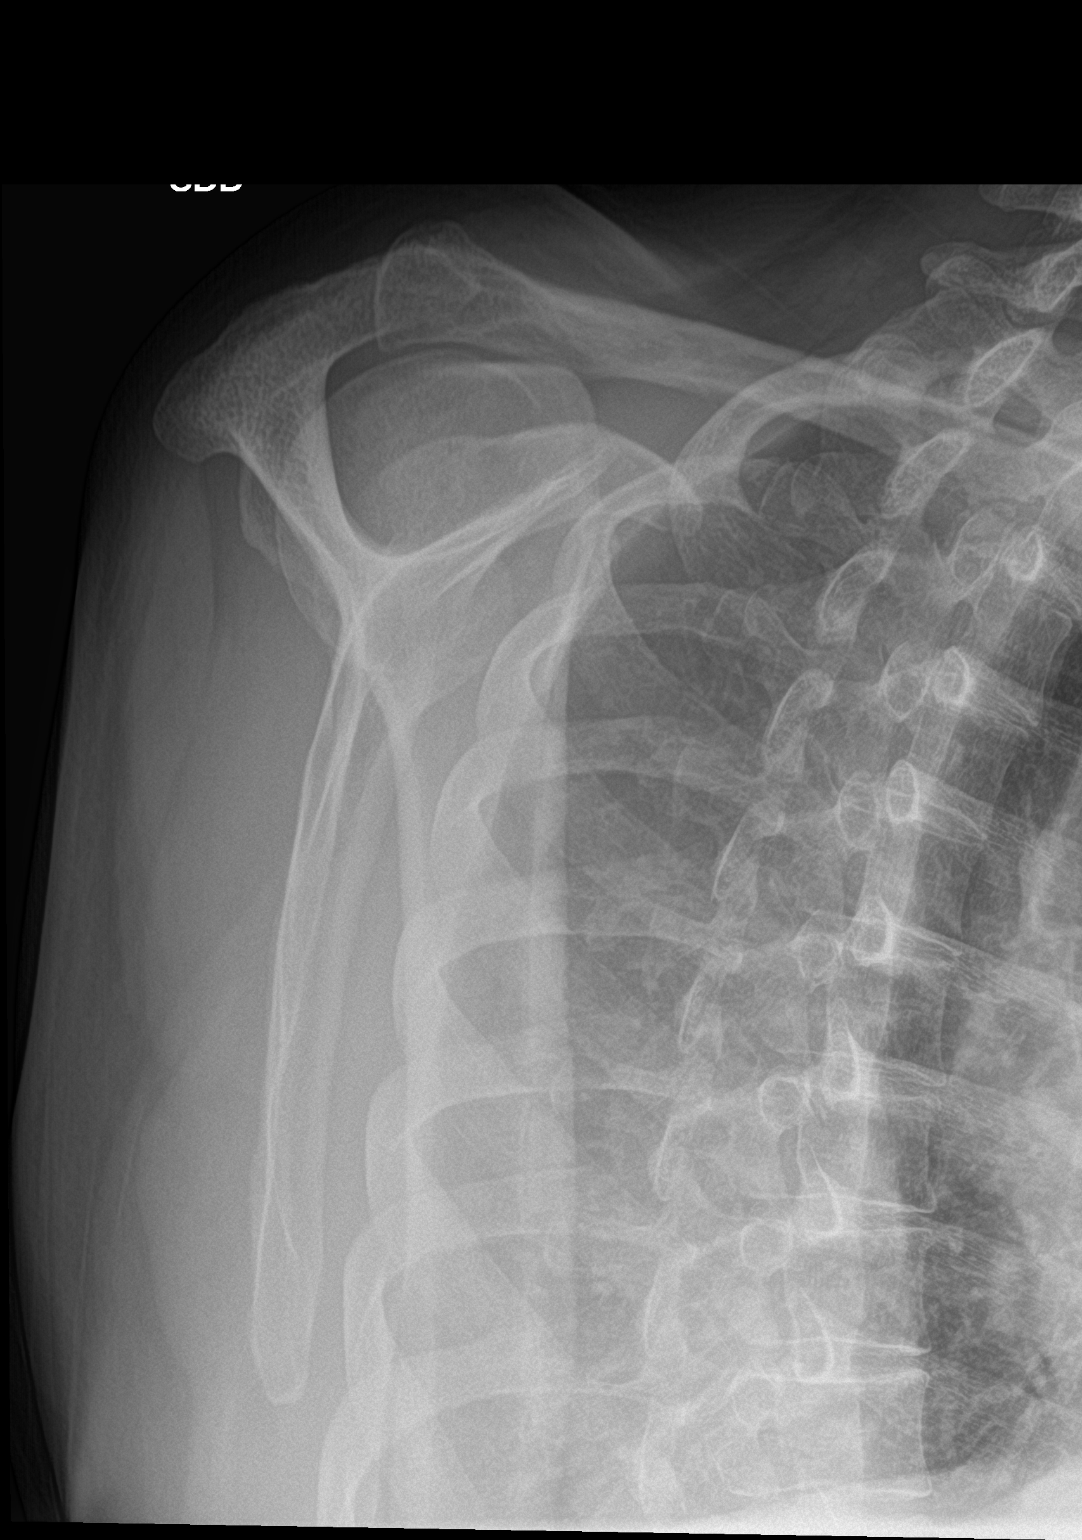

[shoulder axillary]
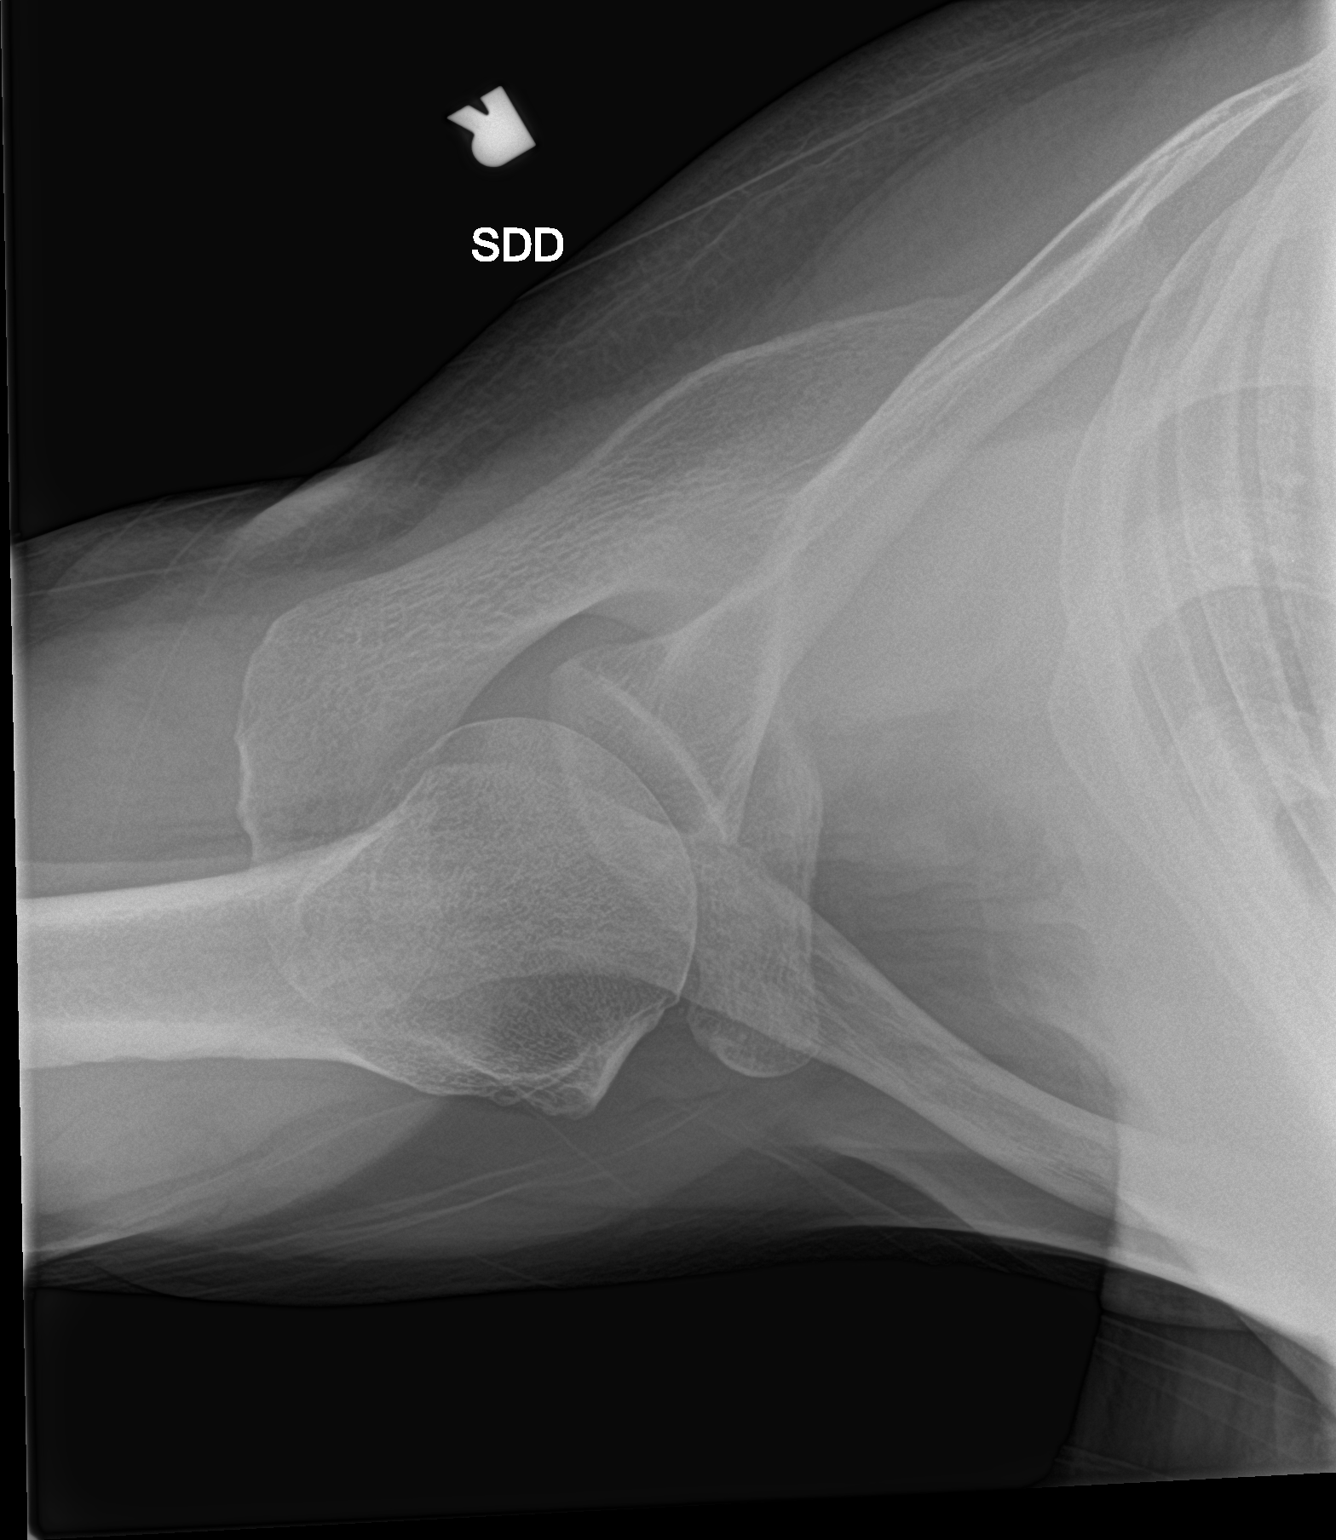

[3 of 3 positions shown; findings below may reference images not displayed]

FINDINGS: There is no evidence of fracture or dislocation. Minimal hypertrophy
of the AC joint. Soft tissues are unremarkable.
IMPRESSION: Minimal degenerative changes of the AC joint.  Otherwise, normal.

## 2017-11-03 ENCOUNTER — Encounter: Payer: Self-pay | Admitting: Family Medicine

## 2017-11-03 ENCOUNTER — Ambulatory Visit (HOSPITAL_COMMUNITY)
Admission: RE | Admit: 2017-11-03 | Discharge: 2017-11-03 | Disposition: A | Discharge status: Home / Self Care | Payer: Medicare Other | Source: Ambulatory Visit | Attending: Family Medicine | Admitting: Family Medicine

## 2017-11-03 ENCOUNTER — Other Ambulatory Visit: Payer: Self-pay

## 2017-11-03 ENCOUNTER — Ambulatory Visit (INDEPENDENT_AMBULATORY_CARE_PROVIDER_SITE_OTHER): Payer: Medicare Other | Admitting: Family Medicine

## 2017-11-03 VITALS — BP 100/70 | HR 76 | Temp 97.6°F | Resp 16 | Ht 69.5 in | Wt 187.2 lb

## 2017-11-03 DIAGNOSIS — E119 Type 2 diabetes mellitus without complications: Secondary | ICD-10-CM | POA: Diagnosis not present

## 2017-11-03 DIAGNOSIS — I25119 Atherosclerotic heart disease of native coronary artery with unspecified angina pectoris: Secondary | ICD-10-CM

## 2017-11-03 DIAGNOSIS — M353 Polymyalgia rheumatica: Secondary | ICD-10-CM

## 2017-11-03 DIAGNOSIS — E038 Other specified hypothyroidism: Secondary | ICD-10-CM | POA: Diagnosis not present

## 2017-11-03 DIAGNOSIS — M5413 Radiculopathy, cervicothoracic region: Secondary | ICD-10-CM | POA: Diagnosis present

## 2017-11-03 DIAGNOSIS — M47816 Spondylosis without myelopathy or radiculopathy, lumbar region: Secondary | ICD-10-CM | POA: Diagnosis not present

## 2017-11-03 DIAGNOSIS — R0609 Other forms of dyspnea: Secondary | ICD-10-CM

## 2017-11-03 DIAGNOSIS — Z7952 Long term (current) use of systemic steroids: Secondary | ICD-10-CM | POA: Diagnosis not present

## 2017-11-03 DIAGNOSIS — R0789 Other chest pain: Secondary | ICD-10-CM | POA: Diagnosis not present

## 2017-11-03 LAB — EKG 12-LEAD

## 2017-11-03 LAB — HEMOGLOBIN A1C, FINGERSTICK: Hgb A1C (fingerstick): 6.7 % OF TOTAL HGB — ABNORMAL HIGH (ref <6.0)

## 2017-11-03 MED ORDER — HYDROCODONE-ACETAMINOPHEN 5-325 MG PO TABS: 1.0000 | ORAL_TABLET | Freq: Three times a day (TID) | ORAL | 0 refills | Status: AC | PRN

## 2017-11-03 MED ORDER — CYCLOBENZAPRINE HCL 5 MG PO TABS
5.0000 mg | ORAL_TABLET | Freq: Three times a day (TID) | ORAL | 1 refills | Status: DC | PRN
Start: 2017-11-03 — End: 2018-05-09

## 2017-11-03 NOTE — Patient Instructions (Addendum)
I will be checking on your thyroid, sugar levels, kidney function with lab work.  I will call you with any abnormalities.  Your EKG was unchanged from your past EKG and it was normal  I will call you with x-ray results.  In the meantime given you medicines for muscle spasms and pain, should use a heating pad frequently, rest from some ear extreme physical labor.  Talk to cardiology to see how soon he can get in for recheck because it does sound very similar to past visit you had with them with worsening cardiac symptoms.      Muscle Strain A muscle strain is an injury that occurs when a muscle is stretched beyond its normal length. Usually a small number of muscle fibers are torn when this happens. Muscle strain is rated in degrees. First-degree strains have the least amount of muscle fiber tearing and pain. Second-degree and third-degree strains have increasingly more tearing and pain. Usually, recovery from muscle strain takes 1-2 weeks. Complete healing takes 5-6 weeks. What are the causes? Muscle strain happens when a sudden, violent force placed on a muscle stretches it too far. This may occur with lifting, sports, or a fall. What increases the risk? Muscle strain is especially common in athletes. What are the signs or symptoms? At the site of the muscle strain, there may be:  Pain.  Bruising.  Swelling.  Difficulty using the muscle due to pain or lack of normal function.  How is this diagnosed? Your health care provider will perform a physical exam and ask about your medical history. How is this treated? Often, the best treatment for a muscle strain is resting, icing, and applying cold compresses to the injured area. Follow these instructions at home:  Use the PRICE method of treatment to promote muscle healing during the first 2-3 days after your injury. The PRICE method involves: ? Protecting the muscle from being injured again. ? Restricting your activity and resting  the injured body part. ? Icing your injury. To do this, put ice in a plastic bag. Place a towel between your skin and the bag. Then, apply the ice and leave it on from 15-20 minutes each hour. After the third day, switch to moist heat packs. ? Apply compression to the injured area with a splint or elastic bandage. Be careful not to wrap it too tightly. This may interfere with blood circulation or increase swelling. ? Elevate the injured body part above the level of your heart as often as you can.  Only take over-the-counter or prescription medicines for pain, discomfort, or fever as directed by your health care provider.  Warming up prior to exercise helps to prevent future muscle strains. Contact a health care provider if:  You have increasing pain or swelling in the injured area.  You have numbness, tingling, or a significant loss of strength in the injured area. This information is not intended to replace advice given to you by your health care provider. Make sure you discuss any questions you have with your health care provider. Document Released: 05/16/2005 Document Revised: 10/22/2015 Document Reviewed: 12/13/2012 Elsevier Interactive Patient Education  2017 [place] therapy can help ease sore, stiff, injured, and tight muscles and joints. Heat relaxes your muscles, which may help ease your pain. What are the risks? If you have any of the following conditions, do not use heat therapy unless your health care provider has approved:  Poor circulation.  Healing wounds or scarred skin  in the area being treated.  Diabetes, heart disease, or high blood pressure.  Not being able to feel (numbness) the area being treated.  Unusual swelling of the area being treated.  Active infections.  Blood clots.  Cancer.  Inability to communicate pain. This may include young children and people who have problems with their brain function  (dementia).  Pregnancy.  Heat therapy should only be used on old, pre-existing, or long-lasting (chronic) injuries. Do not use heat therapy on new injuries unless directed by your health care provider. How to use heat therapy There are several different kinds of heat therapy, including:  Moist heat pack.  Warm water bath.  Hot water bottle.  Electric heating pad.  Heated gel pack.  Heated wrap.  Electric heating pad.  Use the heat therapy method suggested by your health care provider. Follow your health care provider's instructions on when and how to use heat therapy. General heat therapy recommendations  Do not sleep while using heat therapy. Only use heat therapy while you are awake.  Your skin may turn pink while using heat therapy. Do not use heat therapy if your skin turns red.  Do not use heat therapy if you have new pain.  High heat or long exposure to heat can cause burns. Be careful when using heat therapy to avoid burning your skin.  Do not use heat therapy on areas of your skin that are already irritated, such as with a rash or sunburn. Contact a health care provider if:  You have blisters, redness, swelling, or numbness.  You have new pain.  Your pain is worse. This information is not intended to replace advice given to you by your health care provider. Make sure you discuss any questions you have with your health care provider. Document Released: 08/08/2011 Document Revised: 10/22/2015 Document Reviewed: 07/09/2013 Elsevier Interactive Patient Education  [employer].

## 2017-11-03 NOTE — Progress Notes (Signed)
Call patient and discussed findings for thoracic spine, cervical spine and chest x-ray.  No acute changes, no fractures, degenerative and arthritic changes in multiple areas.  Feel he may benefit from final surgeon evaluation.  He does have a appointment coming up the next week with his rheumatologist, advised him to also discuss these findings with rheumatology for any treatment or suggestions that they might have.  His x-rays are reassuring.  Labs are still pending, but I do think it was important for patient to follow-up with cardiology known CAD, recommendations of cardiac cath, concerning symptoms such as fatigue, DOE, intermittent sharp CP.  Pt agreed to contact cardiology for f/up.  Understands ER precautions.

## 2017-11-03 NOTE — Progress Notes (Signed)
Patient ID: Leeman Johnsey, male    DOB: 1945-06-30, 72 y.o.   MRN: [id number]  PCP: Susy Frizzle, MD  Chief Complaint  Patient presents with  . Muscle Pain    Patient in today for muscle soreness to left upper back states having problems with turning head.    Subjective:   Feliciano Wynter is a 72 y.o. male, presents to clinic with CC of pain to his posterior left neck, left thoracic spine, around shoulder blade and left lower ribs and chest that has been severe for the past 2 weeks.  He has neck pain and stiffness only felt when attempting to turn his head to the left he can not rotate as far as he used to and it is painful and stiff.  He has pain around his left scapula and left thoracic spine radiating into his left ribs that comes and goes randomly at times,is sharp and stabbing, is constantly reproduced when moving his left shoulder or elevating his arm to above his head, also pain is much more severe with breathing.  He denies any strain to his neck, back or left arm, denies any heavy lifting, any fall where he caught himself.  He has history of polymyalgia rheumatica, it is currently being treated with long-term steroid use, this pain is not similar to any of the pain that is had in the past related to that, it is different.  He reports change his baseline functional status with worsening dyspnea on exertion from his baseline, he has generalized fatigue, both with activity and is just sleeping more than normal.  He denies cough, wheeze or chest tightness but he is having worsening shortness of breath with exertion.  He denies any PND, orthopnea, palpitations, lower extremity edema, weight gain or loss, no chest pressure or pain with any radiation to his jaw or arms, no chest pain on exertion.  He notes that prior to diagnosis of coronary artery disease his only symptom was shortness of breath, that eventually led to a angiogram which found multi-vessel disease, had one  stent placed is on plavix and ASA.  There was incidental finding of aortic aneurysm, atherosclerosis.  He was advised by his cardiologist, [name], that he needed a another stent placed, but patient has been putting this off and was advised to do it when his symptoms become worse.  Per pt, he has not been to him since end of last year.  Per chart review there is a Jan 2019 visit with cardiology.    Of note patient was last seen in this office by his PCP about 10 months ago for uncontrolled type 2 diabetes that was secondary to prolonged steroid use for treatment of PMR after having several months of bilateral shoulder pain and back pain.  He is overdue to recheck his A1C and basic labs related to this.  He denies weightloss, polyuria, polydipsia   Patient Active Problem List   Diagnosis Date Noted  . Coronary artery disease with exertional angina ([place]) 01/06/2017  . Coronary artery disease involving native coronary artery of native heart with angina pectoris ([place])   . Anginal equivalent ([place]) 07/11/2016  . Exertional dyspnea 03/09/2016  . Abnormal PFT 03/09/2016  . OSTEOARTHRITIS, SHOULDER 08/03/2009  . IMPINGEMENT SYNDROME 07/13/2009     Prior to Admission medications   Medication Sig Start Date End Date Taking? Authorizing Provider  acetaminophen (TYLENOL) 325 MG tablet Take 650 mg by mouth every 4 (four) hours as needed  for mild pain.    Yes [provider]  aspirin 81 MG EC tablet Take 81 mg by mouth at bedtime.    Yes [provider]  clopidogrel (PLAVIX) 75 MG tablet Take 1 tablet (75 mg total) by mouth daily. 01/07/17  Yes Strader, [place], PA-C  glucose blood test strip Check BS BID Dx: E11.9 09/23/16  Yes Susy Frizzle, MD  Lancets (ACCU-CHEK MULTICLIX) lancets Check BS BID Dx: E11.9 01/03/17  Yes Susy Frizzle, MD  levothyroxine (SYNTHROID, LEVOTHROID) 100 MCG tablet Take 1 tablet (100 mcg total) by mouth daily before breakfast. 04/19/17  Yes  Susy Frizzle, MD  metFORMIN (GLUCOPHAGE) 1000 MG tablet Take 1 tablet (1,000 mg total) by mouth 2 (two) times daily with a meal. 10/25/16  Yes Susy Frizzle, MD  Multiple Vitamin (MULTIVITAMIN WITH MINERALS) TABS tablet Take 1 tablet by mouth daily.   Yes [provider]  predniSONE (DELTASONE) 5 MG tablet Take 4 mg by mouth daily with breakfast. Take with #2 of the 1 mg tablets daily   Yes [provider]  rosuvastatin (CRESTOR) 20 MG tablet Take 1 tablet (20 mg total) at bedtime by mouth. 04/17/17  Yes Bensimhon, Shaune Pascal, MD  sitaGLIPtin (JANUVIA) 100 MG tablet Take 1 tablet (100 mg total) by mouth daily. 04/19/17  Yes Susy Frizzle, MD  vitamin B-12 (CYANOCOBALAMIN) 1000 MCG tablet Take 1,000 mcg by mouth daily.   Yes [provider]     Allergies  Allergen Reactions  . Bee Venom Swelling  . Ceclor [Cefaclor] Hives  . Ciprofloxacin Hives  . Shellfish Allergy Hives    Pt states it is questionable// he eats seafood still without problem.     Family History  Problem Relation Age of Onset  . Diabetes Mellitus II Father   . Heart failure Father   . Kidney failure Father   . Heart attack Mother      Social History   Socioeconomic History  . Marital status: Married    Spouse name: [name]  . Number of children: 0  . Years of education: 54  . Highest education level: Not on file  Occupational History    Comment: retired  Scientific laboratory technician  . Financial resource strain: Not on file  . Food insecurity:    Worry: Not on file    Inability: Not on file  . Transportation needs:    Medical: Not on file    Non-medical: Not on file  Tobacco Use  . Smoking status: Never Smoker  . Smokeless tobacco: Never Used  Substance and Sexual Activity  . Alcohol use: Yes    Alcohol/week: 1.2 oz    Types: 1 Glasses of wine, 1 Cans of beer per week    Comment: 1 beer/week  . Drug use: No  . Sexual activity: Not Currently  Lifestyle  . Physical activity:      Days per week: Not on file    Minutes per session: Not on file  . Stress: Not on file  Relationships  . Social connections:    Talks on phone: Not on file    Gets together: Not on file    Attends religious service: Not on file    Active member of club or organization: Not on file    Attends meetings of clubs or organizations: Not on file    Relationship status: Not on file  . Intimate partner violence:    Fear of current or ex partner: Not  on file    Emotionally abused: Not on file    Physically abused: Not on file    Forced sexual activity: Not on file  Other Topics Concern  . Not on file  Social History Narrative   Retired. Lives with wife, [name].    Caffeine- coffee, 1 daily     Review of Systems  All other systems reviewed and are negative.  10 Systems reviewed and are negative for acute change except as noted in the HPI.     Objective:    Vitals:   11/03/17 0908  BP: 100/70  Pulse: 76  Resp: 16  Temp: 97.6 F (36.4 C)  TempSrc: Oral  SpO2: 97%  Weight: 187 lb 4 oz (84.9 kg)  Height: 5' 9.5" (1.765 m)      Physical Exam  Constitutional: He is oriented to person, place, and time. He appears well-developed and well-nourished.  Non-toxic appearance. He does not appear ill. No distress.  HENT:  Head: Normocephalic and atraumatic.  Right Ear: Tympanic membrane, external ear and ear canal normal.  Left Ear: Tympanic membrane, external ear and ear canal normal.  Nose: Nose normal. No mucosal edema or rhinorrhea. Right sinus exhibits no maxillary sinus tenderness and no frontal sinus tenderness. Left sinus exhibits no maxillary sinus tenderness and no frontal sinus tenderness.  Mouth/Throat: Uvula is midline and oropharynx is clear and moist. No trismus in the jaw. No uvula swelling. No oropharyngeal exudate, posterior oropharyngeal edema or posterior oropharyngeal erythema.  Eyes: Pupils are equal, round, and reactive to light. Conjunctivae, EOM and lids are  normal. No scleral icterus.  Neck: Trachea normal, normal range of motion and phonation normal. Neck supple. No JVD present. No tracheal deviation present. No thyromegaly present.  Cardiovascular: Normal rate, regular rhythm, normal heart sounds and normal pulses. Exam reveals no gallop and no friction rub.  No murmur heard. Pulses:      Radial pulses are 2+ on the right side, and 2+ on the left side.       Posterior tibial pulses are 2+ on the right side, and 2+ on the left side.  No JVD, no LE pitting edema  Pulmonary/Chest: Effort normal. No stridor. No respiratory distress. He has no wheezes. He has no rhonchi. He has no rales.  Mildly diminished LLL BS Left posterior thoracic chest wall ttp  Abdominal: Soft. Normal appearance and bowel sounds are normal. He exhibits no distension and no mass. There is no tenderness. There is no rebound and no guarding.  No CVA tenderness  Musculoskeletal: He exhibits no edema or deformity.       Left shoulder: He exhibits decreased range of motion and tenderness. He exhibits no bony tenderness, no swelling, no effusion, no crepitus, normal pulse and normal strength.       Cervical back: He exhibits decreased range of motion and tenderness. He exhibits no bony tenderness, no swelling, no edema and no deformity.       Thoracic back: He exhibits tenderness. He exhibits no bony tenderness, no swelling, no edema and no spasm.  No midline tenderness from cervical to lumbar spine, no step-off Limited rotation of the head to the left,  Lymphadenopathy:    He has no cervical adenopathy.  Neurological: He is alert and oriented to person, place, and time. Gait normal.  Skin: Skin is warm, dry and intact. Capillary refill takes less than 2 seconds. No rash noted. He is not diaphoretic.  Psychiatric: He has a normal mood and affect.  His speech is normal and behavior is normal.  Nursing note and vitals reviewed.    EKG: NSR, no change from prior EKG (06/07/17), no  ischemic changes     Assessment & Plan:      ICD-10-CM   1. Radiculopathy of cervicothoracic region M54.13 DG Cervical Spine Complete    DG Thoracic Spine W/Swimmers    HYDROcodone-acetaminophen (NORCO) 5-325 MG tablet  2. Intermittent left-sided chest pain R07.89 DG Chest 2 View    EKG 12-Lead    Brain natriuretic peptide    D-dimer, quantitative (not at [facility])  3. DOE (dyspnea on exertion) R06.09 DG Chest 2 View    EKG 12-Lead    CBC with Differential    Brain natriuretic peptide    D-dimer, quantitative (not at [facility])  4. Other specified hypothyroidism E03.8 TSH  5. PMR (polymyalgia rheumatica) (HCC) M35.3   6. Coronary artery disease involving native coronary artery of native heart with angina pectoris ([place]) I25.119   7. Controlled type 2 diabetes mellitus without complication, without long-term current use of insulin (HCC) Q67.6 COMPLETE METABOLIC PANEL WITH GFR    CBC with Differential    Hemoglobin A1C, fingerstick  8. 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  He does have PMR, chronic steroid user, has known DDD of lumbar spine, he states that this pain is unlike any of this past MSK pain.  He does have known cardiac disease, cards has recommended another cath/stent, he has not followed up with cardiology or with his PCP in some time, he may have some worsening of disease, will evaluate chest x-ray and basic lab work, patient is going to reach out and contact cardiology.  EKG is reassuring,  normal sinus rhythm, no change from most recent EKG which was reviewed and dated 06/07/2017.  EKG and office visit with cardiology reviewed, their documentation describes extremely similar symptoms to what is having today with the sharp intermittent left chest pain and chest wall pain with some worsening baseline dyspnea on exertion.  Feel he does need to follow up with cardiology and revisit their suggestions for working this up.   Patient is low risk for PE with 0 point and Wells criteria, because of respiratory symptoms and pain in chest will add on d-dimer to hopefully rule out any possibility of PE.  Cardio/pulm exam reassuring, VSS, pt w/o distress.  Will get basic labs, BNP, d-dimer, CXR with spine films, will notify pt if any concerning findings over the weekend, if everything is normal he understands that I will get back to him re normal results next mon/tues.    He understands ER precautions.   Delsa Grana, PA-C 11/03/17 9:22 AM

## 2017-11-04 LAB — CBC WITH DIFFERENTIAL/PLATELET
BASOS ABS: 122 {cells}/uL (ref 0–200)
Basophils Relative: 1.4 %
EOS PCT: 3.9 %
Eosinophils Absolute: 339 cells/uL (ref 15–500)
HCT: 44.9 % (ref 38.5–50.0)
Hemoglobin: 15.5 g/dL (ref 13.2–17.1)
Lymphs Abs: 1610 cells/uL (ref 850–3900)
MCH: 32.1 pg (ref 27.0–33.0)
MCHC: 34.5 g/dL (ref 32.0–36.0)
MCV: 93 fL (ref 80.0–100.0)
MPV: 11.7 fL (ref 7.5–12.5)
Monocytes Relative: 8.6 %
NEUTROS PCT: 67.6 %
Neutro Abs: 5881 cells/uL (ref 1500–7800)
PLATELETS: 218 10*3/uL (ref 140–400)
RBC: 4.83 10*6/uL (ref 4.20–5.80)
RDW: 12.9 % (ref 11.0–15.0)
TOTAL LYMPHOCYTE: 18.5 %
WBC mixed population: 748 cells/uL (ref 200–950)
WBC: 8.7 10*3/uL (ref 3.8–10.8)

## 2017-11-04 LAB — D-DIMER, QUANTITATIVE: D-Dimer, Quant: 0.45 mcg/mL FEU (ref <0.50)

## 2017-11-04 LAB — TSH: TSH: 0.29 mIU/L — ABNORMAL LOW (ref 0.40–4.50)

## 2017-11-04 LAB — BRAIN NATRIURETIC PEPTIDE: BRAIN NATRIURETIC PEPTIDE: 48 pg/mL (ref <100)

## 2017-11-06 NOTE — Progress Notes (Signed)
Caleb Butler of his blood work.  The lab work that would be concerning for heart failure or blood clots in the lungs were negative and very reassuring.  His cell counts did not show anemia or increased white blood cells (infection).  His TSH was actually low, which could mean the thyroid medication dosing is a little high however he can follow-up with his regular doctor about this.  A1c was 6.7, which is great with his longterm steroid use.   Pt should still try and follow up with cardiology to r/o any worsening CAD.

## 2017-12-14 ENCOUNTER — Other Ambulatory Visit: Payer: Self-pay | Admitting: Family Medicine

## 2018-01-13 ENCOUNTER — Other Ambulatory Visit: Payer: Self-pay | Admitting: Family Medicine

## 2018-02-15 ENCOUNTER — Other Ambulatory Visit: Payer: Self-pay | Admitting: Family Medicine

## 2018-02-15 MED ORDER — SITAGLIPTIN PHOSPHATE 100 MG PO TABS: 100.0000 mg | ORAL_TABLET | Freq: Every day | ORAL | 1 refills | Status: DC

## 2018-03-01 ENCOUNTER — Telehealth (HOSPITAL_COMMUNITY): Payer: Self-pay | Admitting: *Deleted

## 2018-03-01 NOTE — Telephone Encounter (Signed)
Pt called requesting an appointment or call from [name] to discuss stents. Pt now wants stents states it is harder to breathe and overall just feels as if he is declining. Per Heather scheduled office visit 03/05/18 at 2pm

## 2018-03-05 ENCOUNTER — Ambulatory Visit (HOSPITAL_COMMUNITY)
Admission: RE | Admit: 2018-03-05 | Discharge: 2018-03-05 | Disposition: A | Discharge status: Home / Self Care | Payer: Medicare Other | Source: Ambulatory Visit | Attending: Internal Medicine | Admitting: Internal Medicine

## 2018-03-05 ENCOUNTER — Encounter (HOSPITAL_COMMUNITY): Payer: Self-pay | Admitting: Internal Medicine

## 2018-03-05 ENCOUNTER — Other Ambulatory Visit (HOSPITAL_COMMUNITY): Payer: Self-pay | Admitting: *Deleted

## 2018-03-05 VITALS — BP 98/58 | HR 77 | Wt 189.4 lb

## 2018-03-05 DIAGNOSIS — K449 Diaphragmatic hernia without obstruction or gangrene: Secondary | ICD-10-CM | POA: Diagnosis not present

## 2018-03-05 DIAGNOSIS — I25119 Atherosclerotic heart disease of native coronary artery with unspecified angina pectoris: Secondary | ICD-10-CM

## 2018-03-05 DIAGNOSIS — Z7902 Long term (current) use of antithrombotics/antiplatelets: Secondary | ICD-10-CM | POA: Insufficient documentation

## 2018-03-05 DIAGNOSIS — Z8249 Family history of ischemic heart disease and other diseases of the circulatory system: Secondary | ICD-10-CM | POA: Diagnosis not present

## 2018-03-05 DIAGNOSIS — Z7982 Long term (current) use of aspirin: Secondary | ICD-10-CM | POA: Diagnosis not present

## 2018-03-05 DIAGNOSIS — K589 Irritable bowel syndrome without diarrhea: Secondary | ICD-10-CM | POA: Insufficient documentation

## 2018-03-05 DIAGNOSIS — R079 Chest pain, unspecified: Secondary | ICD-10-CM | POA: Diagnosis present

## 2018-03-05 DIAGNOSIS — Z7984 Long term (current) use of oral hypoglycemic drugs: Secondary | ICD-10-CM | POA: Diagnosis not present

## 2018-03-05 DIAGNOSIS — Z833 Family history of diabetes mellitus: Secondary | ICD-10-CM | POA: Insufficient documentation

## 2018-03-05 DIAGNOSIS — Z955 Presence of coronary angioplasty implant and graft: Secondary | ICD-10-CM | POA: Diagnosis not present

## 2018-03-05 DIAGNOSIS — E785 Hyperlipidemia, unspecified: Secondary | ICD-10-CM | POA: Insufficient documentation

## 2018-03-05 DIAGNOSIS — R0602 Shortness of breath: Secondary | ICD-10-CM | POA: Insufficient documentation

## 2018-03-05 DIAGNOSIS — R0609 Other forms of dyspnea: Secondary | ICD-10-CM

## 2018-03-05 DIAGNOSIS — Z79890 Hormone replacement therapy (postmenopausal): Secondary | ICD-10-CM | POA: Insufficient documentation

## 2018-03-05 DIAGNOSIS — I208 Other forms of angina pectoris: Secondary | ICD-10-CM

## 2018-03-05 DIAGNOSIS — M353 Polymyalgia rheumatica: Secondary | ICD-10-CM | POA: Diagnosis not present

## 2018-03-05 DIAGNOSIS — E119 Type 2 diabetes mellitus without complications: Secondary | ICD-10-CM | POA: Insufficient documentation

## 2018-03-05 DIAGNOSIS — F419 Anxiety disorder, unspecified: Secondary | ICD-10-CM | POA: Diagnosis not present

## 2018-03-05 DIAGNOSIS — R0600 Dyspnea, unspecified: Secondary | ICD-10-CM | POA: Diagnosis not present

## 2018-03-05 DIAGNOSIS — K219 Gastro-esophageal reflux disease without esophagitis: Secondary | ICD-10-CM | POA: Insufficient documentation

## 2018-03-05 DIAGNOSIS — E039 Hypothyroidism, unspecified: Secondary | ICD-10-CM | POA: Diagnosis not present

## 2018-03-05 DIAGNOSIS — Z79899 Other long term (current) drug therapy: Secondary | ICD-10-CM | POA: Diagnosis not present

## 2018-03-05 DIAGNOSIS — I2510 Atherosclerotic heart disease of native coronary artery without angina pectoris: Secondary | ICD-10-CM | POA: Diagnosis not present

## 2018-03-05 DIAGNOSIS — E7800 Pure hypercholesterolemia, unspecified: Secondary | ICD-10-CM | POA: Insufficient documentation

## 2018-03-05 DIAGNOSIS — R0789 Other chest pain: Secondary | ICD-10-CM | POA: Insufficient documentation

## 2018-03-05 DIAGNOSIS — Z881 Allergy status to other antibiotic agents status: Secondary | ICD-10-CM | POA: Diagnosis not present

## 2018-03-05 DIAGNOSIS — I2089 Other forms of angina pectoris: Secondary | ICD-10-CM

## 2018-03-05 LAB — BASIC METABOLIC PANEL
Anion gap: 7 (ref 5–15)
BUN: 20 mg/dL (ref 8–23)
CO2: 24 mmol/L (ref 22–32)
CREATININE: 1.16 mg/dL (ref 0.61–1.24)
Calcium: 9.2 mg/dL (ref 8.9–10.3)
Chloride: 107 mmol/L (ref 98–111)
GFR calc non Af Amer: >60 mL/min (ref >60)
Glucose, Bld: 177 mg/dL — ABNORMAL HIGH (ref 70–99)
Potassium: 3.9 mmol/L (ref 3.5–5.1)
SODIUM: 138 mmol/L (ref 135–145)

## 2018-03-05 LAB — CBC
HCT: 41.8 % (ref 39.0–52.0)
Hemoglobin: 14 g/dL (ref 13.0–17.0)
MCH: 32.6 pg (ref 26.0–34.0)
MCHC: 33.5 g/dL (ref 30.0–36.0)
MCV: 97.2 fL (ref 78.0–100.0)
Platelets: 218 10*3/uL (ref 150–400)
RBC: 4.3 MIL/uL (ref 4.22–5.81)
RDW: 14 % (ref 11.5–15.5)
WBC: 7.6 10*3/uL (ref 4.0–10.5)

## 2018-03-05 LAB — PROTIME-INR
INR: 1.06
PROTHROMBIN TIME: 13.7 s (ref 11.4–15.2)

## 2018-03-05 MED ORDER — PREDNISONE 50 MG PO TABS: ORAL_TABLET | ORAL | 0 refills | Status: DC

## 2018-03-05 NOTE — H&P (View-Only) (Signed)
Advanced Heart Failure Clinic Note   Referring Physician: Dr. Domenic Polite Primary Care: Jenna Luo, MD Primary Cardiologist: Dr. Domenic Polite  HPI:  Armoni Depass is a 72 y.o. male with history of long-standing, progressive dyspnea on exertion (NYHA 2-3), Hypothyroidism, DM2, and HLD.  Right and left heart cath in Feb. 2018 showed a 90% ostial LCx lesion that involved the ostium of the Ramus as well. Also with a 40% proximal RCA lesion. It was felt at the time that this was not favorable to PCI but he continues to have symptoms and underwent PCI of ostial LCX with cutting balloon and DES on 01/06/17.   He presents today as an add on with CP and SOB. Says for past several months he has been very SOB and had chest discomfort almost every day. Says it feels like a heavy feeling in his chest. Sometimes feels like an electric shock. Chest discomfort not worse with exertion. 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 Says symptoms are worse in the heat. No edema, orthopnea or PND. Finished CR in March but hasn't exercised regularly since. Taking all medicines regularly. BP runs low.   Review of systems complete and found to be negative unless listed in HPI.   01/06/2017  Successful PCI of severe ostial stenosis in the left circumflex, DES placed.  Recommendations: Aspirin and Plavix for a minimum of 12 months. If tolerated favor long-term DAPT as the stent is positioned at the left main bifurcation.  Cardiac cath 07/07/16   The left ventricular ejection fraction is 55-65% by visual estimate.  Prox RCA lesion, 40 %stenosed.  Ost Cx lesion, 90 %stenosed.  Ramus lesion, 90 %stenosed.  LM lesion, 20 %stenosed.  Mid LAD lesion, 20 %stenosed.  1st Diag lesion, 80 %stenosed.  Findings: RA = 5 RV = 23/8 PA = 27/4 (16) PCW = 3 Fick cardiac output/index = 5.2/2.6 PVR = 2.5 WU  Ao sat =95% PA sat = 68%, 69% Assessment: 1. 1-V CAD at ostium of LCX also involving ostium of  ramus 2. Aneursymal proximal LAD with mild plaque 3. 40% Proximal RCA 4. Normal LVEF with normal hemodynamics  Echo 02/2016 LVEF 60-65%, Grade 2 DD, Mild AI, Trivial MR, Pa pear pressure 24 mm Hg. Normal RV.  High res Chest CT 03/13/2016 - No evidence of ILD. Mild patchy bibasilar scarring and volume loss. Incidental finding of AAA. + coronary artery calcification, + right adrenal myelolipoma.   PFTS 11/2015 FVC 2.75 (64%) FEV1 2.05 (65%) TLC 5.26 (77%) DLCO 20.70 (66%) -> corrects to 100% with alveolar volume.   CPX 05/06/16 Pre-Exercise PFTs  FVC 3.05 (74%)    FEV1 2.19 (70%)       FEV1/FVC 72 (94%)     MVV 86 (69%) Post-Exercise PFTs ((from lowest post-exercise trial (%change from rest)) FVC performed IPE, 5, 10, 15 mins FVC 3.19 (+6%) IPE    FEV1 2.44 (+11%)  10 mins     FEV1/FVC 73 (+1%)    Exercise Time:  8:30      Watts: 120 RPE: 19 Reason stopped: Patient ended test due to dyspnea (9/10) and leg fatigue Additional symptoms: Back pain (7-8/10) with weight on left leg Resting HR: 79 Peak HR: 135  (89% age predicted max HR) BP rest: 134/66 BP peak: 128/66 Peak VO2: 19.8 (86% predicted peak VO2) VE/VCO2 slope: 44 OUES: 1.46 Peak RER: 1.11 Ventilatory Threshold: 15.2 (66% predicted or measured peak VO2) Peak RR 53 Peak Ventilation: 88.8 VE/MVV: 103% PETCO2 at  peak: 22 O2pulse: 12  (92% predicted O2pulse)  Family History: Father had CHF (unclear etiology) and CKD (Passed at 54 yo) Mother passed of MI at 53 yo.  Social History: Lives with wife in [place]. Raises Livestock.  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    Past Medical History:  Diagnosis Date  . Anxiety   . Arthritis    "my back is loaded w/it" (01/06/2017)  . CAD (coronary artery disease)    80% ostial lesion in L circumflex (2017)  . Chronic lower back pain   . GERD (gastroesophageal reflux disease)   . Hiatal hernia   . High  cholesterol   . Hypothyroidism   . IBS (irritable bowel syndrome)   . Numbness and tingling of right lower extremity    "since 11/30/2016" (01/06/2017)  . PMR (polymyalgia rheumatica) (HCC)   . Type 2 diabetes mellitus ([place])     Current Outpatient Medications  Medication Sig Dispense Refill  . acetaminophen (TYLENOL) 325 MG tablet Take 650 mg by mouth every 4 (four) hours as needed for mild pain.     Marland Kitchen aspirin 81 MG EC tablet Take 81 mg by mouth at bedtime.     . clopidogrel (PLAVIX) 75 MG tablet Take 1 tablet (75 mg total) by mouth daily. 90 tablet 3  . cyclobenzaprine (FLEXERIL) 5 MG tablet Take 1-2 tablets (5-10 mg total) by mouth 3 (three) times daily as needed for muscle spasms (muscle tightness). 30 tablet 1  . glucose blood test strip Check BS BID Dx: E11.9 100 each 5  . Lancets (ACCU-CHEK MULTICLIX) lancets Check BS BID Dx: E11.9 100 each 12  . levothyroxine (SYNTHROID, LEVOTHROID) 100 MCG tablet Take 1 tablet (100 mcg total) by mouth daily before breakfast. 90 tablet 3  . metFORMIN (GLUCOPHAGE) 1000 MG tablet TAKE 1 TABLET (1,000 MG TOTAL) BY MOUTH 2 (TWO) TIMES DAILY WITH A MEAL. 180 tablet 3  . methotrexate (RHEUMATREX) 2.5 MG tablet Take 15 mg by mouth once a week. Caution:Chemotherapy. Protect from light.    . Multiple Vitamin (MULTIVITAMIN WITH MINERALS) TABS tablet Take 1 tablet by mouth daily.    . predniSONE (DELTASONE) 1 MG tablet Take 1 mg by mouth daily with breakfast.    . rosuvastatin (CRESTOR) 20 MG tablet Take 1 tablet (20 mg total) at bedtime by mouth. 90 tablet 3  . sitaGLIPtin (JANUVIA) 100 MG tablet Take 1 tablet (100 mg total) by mouth daily. 90 tablet 1  . vitamin B-12 (CYANOCOBALAMIN) 1000 MCG tablet Take 1,000 mcg by mouth daily.     No current facility-administered medications for this encounter.     Allergies  Allergen Reactions  . Bee Venom Swelling  . Ceclor [Cefaclor] Hives  . Ciprofloxacin Hives  . Shellfish Allergy Hives    Pt states it is  questionable// he eats seafood still without problem.      Social History   Socioeconomic History  . Marital status: Married    Spouse name: [name]  . Number of children: 0  . Years of education: 40  . Highest education level: Not on file  Occupational History    Comment: retired  Scientific laboratory technician  . Financial resource strain: Not on file  . Food insecurity:    Worry: Not on file    Inability: Not on file  . Transportation needs:    Medical: Not on file    Non-medical: Not on file  Tobacco Use  . Smoking status: Never Smoker  . Smokeless  tobacco: Never Used  Substance and Sexual Activity  . Alcohol use: Yes    Alcohol/week: 2.0 standard drinks    Types: 1 Glasses of wine, 1 Cans of beer per week    Comment: 1 beer/week  . Drug use: No  . Sexual activity: Not Currently  Lifestyle  . Physical activity:    Days per week: Not on file    Minutes per session: Not on file  . Stress: Not on file  Relationships  . Social connections:    Talks on phone: Not on file    Gets together: Not on file    Attends religious service: Not on file    Active member of club or organization: Not on file    Attends meetings of clubs or organizations: Not on file    Relationship status: Not on file  . Intimate partner violence:    Fear of current or ex partner: Not on file    Emotionally abused: Not on file    Physically abused: Not on file    Forced sexual activity: Not on file  Other Topics Concern  . Not on file  Social History Narrative   Retired. Lives with wife, [name].    Caffeine- coffee, 1 daily     Family History  Problem Relation Age of Onset  . Diabetes Mellitus II Father   . Heart failure Father   . Kidney failure Father   . Heart attack Mother    Vitals:   03/05/18 1410  BP: (!) 98/58  Pulse: 77  SpO2: 98%  Weight: 85.9 kg (189 lb 6 oz)    PHYSICAL EXAM: General:  Well appearing. No resp difficulty HEENT: normal Neck: supple. no JVD. Carotids 2+ bilat; no  bruits. No lymphadenopathy or thryomegaly appreciated. Cor: PMI nondisplaced. Regular rate & rhythm. No rubs, gallops or murmurs. Lungs: clear Abdomen: soft, nontender, nondistended. No hepatosplenomegaly. No bruits or masses. Good bowel sounds. Extremities: no cyanosis, clubbing, rash, edema Neuro: alert & orientedx3, cranial nerves grossly intact. moves all 4 extremities w/o difficulty. Affect pleasant  ECG: NSR 75 No ST-T wave abnormalities.    ASSESSMENT & PLAN: 1. CAD with progressive CP and dyspnea - S/P PCI of severe ostial stenosis in the left circumflex, with DES placed 10/18.  - Given worsening symptoms will proceed with relook cath with RHC as well. Also check echo. If cath stable consider CPX testing   - Low threshold to relook cath with any recurrent symptoms, but overall seems stable for now. Pt knows to call immediately with any worsening symptoms.  - Continue ASA and Plavix - Continue Crestor 20 mg daily 2. Hyperlipidemia - Continue crestor.  3. Diabetes  - Consider Jardiance   Glori Bickers, MD  4:12 PM

## 2018-03-05 NOTE — Progress Notes (Signed)
Advanced Heart Failure Clinic Note   Referring Physician: Dr. Domenic Polite Primary Care: Jenna Luo, MD Primary Cardiologist: Dr. Domenic Polite  HPI:  Caleb Butler is a 72 y.o. male with history of long-standing, progressive dyspnea on exertion (NYHA 2-3), Hypothyroidism, DM2, and HLD.  Right and left heart cath in Feb. 2018 showed a 90% ostial LCx lesion that involved the ostium of the Ramus as well. Also with a 40% proximal RCA lesion. It was felt at the time that this was not favorable to PCI but he continues to have symptoms and underwent PCI of ostial LCX with cutting balloon and DES on 01/06/17.   He presents today as an add on with CP and SOB. Says for past several months he has been very SOB and had chest discomfort almost every day. Says it feels like a heavy feeling in his chest. Sometimes feels like an electric shock. Chest discomfort not worse with exertion. 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 Says symptoms are worse in the heat. No edema, orthopnea or PND. Finished CR in March but hasn't exercised regularly since. Taking all medicines regularly. BP runs low.   Review of systems complete and found to be negative unless listed in HPI.   01/06/2017  Successful PCI of severe ostial stenosis in the left circumflex, DES placed.  Recommendations: Aspirin and Plavix for a minimum of 12 months. If tolerated favor long-term DAPT as the stent is positioned at the left main bifurcation.  Cardiac cath 07/07/16   The left ventricular ejection fraction is 55-65% by visual estimate.  Prox RCA lesion, 40 %stenosed.  Ost Cx lesion, 90 %stenosed.  Ramus lesion, 90 %stenosed.  LM lesion, 20 %stenosed.  Mid LAD lesion, 20 %stenosed.  1st Diag lesion, 80 %stenosed.  Findings: RA = 5 RV = 23/8 PA = 27/4 (16) PCW = 3 Fick cardiac output/index = 5.2/2.6 PVR = 2.5 WU  Ao sat =95% PA sat = 68%, 69% Assessment: 1. 1-V CAD at ostium of LCX also involving ostium of  ramus 2. Aneursymal proximal LAD with mild plaque 3. 40% Proximal RCA 4. Normal LVEF with normal hemodynamics  Echo 02/2016 LVEF 60-65%, Grade 2 DD, Mild AI, Trivial MR, Pa pear pressure 24 mm Hg. Normal RV.  High res Chest CT 03/13/2016 - No evidence of ILD. Mild patchy bibasilar scarring and volume loss. Incidental finding of AAA. + coronary artery calcification, + right adrenal myelolipoma.   PFTS 11/2015 FVC 2.75 (64%) FEV1 2.05 (65%) TLC 5.26 (77%) DLCO 20.70 (66%) -> corrects to 100% with alveolar volume.   CPX 05/06/16 Pre-Exercise PFTs  FVC 3.05 (74%)    FEV1 2.19 (70%)       FEV1/FVC 72 (94%)     MVV 86 (69%) Post-Exercise PFTs ((from lowest post-exercise trial (%change from rest)) FVC performed IPE, 5, 10, 15 mins FVC 3.19 (+6%) IPE    FEV1 2.44 (+11%)  10 mins     FEV1/FVC 73 (+1%)    Exercise Time:  8:30      Watts: 120 RPE: 19 Reason stopped: Patient ended test due to dyspnea (9/10) and leg fatigue Additional symptoms: Back pain (7-8/10) with weight on left leg Resting HR: 79 Peak HR: 135  (89% age predicted max HR) BP rest: 134/66 BP peak: 128/66 Peak VO2: 19.8 (86% predicted peak VO2) VE/VCO2 slope: 44 OUES: 1.46 Peak RER: 1.11 Ventilatory Threshold: 15.2 (66% predicted or measured peak VO2) Peak RR 53 Peak Ventilation: 88.8 VE/MVV: 103% PETCO2 at  peak: 22 O2pulse: 12  (92% predicted O2pulse)  Family History: Father had CHF (unclear etiology) and CKD (Passed at 46 yo) Mother passed of MI at 72 yo.  Social History: Lives with wife in [place]. Raises Livestock.  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    Past Medical History:  Diagnosis Date  . Anxiety   . Arthritis    "my back is loaded w/it" (01/06/2017)  . CAD (coronary artery disease)    80% ostial lesion in L circumflex (2017)  . Chronic lower back pain   . GERD (gastroesophageal reflux disease)   . Hiatal hernia   . High  cholesterol   . Hypothyroidism   . IBS (irritable bowel syndrome)   . Numbness and tingling of right lower extremity    "since 11/30/2016" (01/06/2017)  . PMR (polymyalgia rheumatica) (HCC)   . Type 2 diabetes mellitus ([place])     Current Outpatient Medications  Medication Sig Dispense Refill  . acetaminophen (TYLENOL) 325 MG tablet Take 650 mg by mouth every 4 (four) hours as needed for mild pain.     Marland Kitchen aspirin 81 MG EC tablet Take 81 mg by mouth at bedtime.     . clopidogrel (PLAVIX) 75 MG tablet Take 1 tablet (75 mg total) by mouth daily. 90 tablet 3  . cyclobenzaprine (FLEXERIL) 5 MG tablet Take 1-2 tablets (5-10 mg total) by mouth 3 (three) times daily as needed for muscle spasms (muscle tightness). 30 tablet 1  . glucose blood test strip Check BS BID Dx: E11.9 100 each 5  . Lancets (ACCU-CHEK MULTICLIX) lancets Check BS BID Dx: E11.9 100 each 12  . levothyroxine (SYNTHROID, LEVOTHROID) 100 MCG tablet Take 1 tablet (100 mcg total) by mouth daily before breakfast. 90 tablet 3  . metFORMIN (GLUCOPHAGE) 1000 MG tablet TAKE 1 TABLET (1,000 MG TOTAL) BY MOUTH 2 (TWO) TIMES DAILY WITH A MEAL. 180 tablet 3  . methotrexate (RHEUMATREX) 2.5 MG tablet Take 15 mg by mouth once a week. Caution:Chemotherapy. Protect from light.    . Multiple Vitamin (MULTIVITAMIN WITH MINERALS) TABS tablet Take 1 tablet by mouth daily.    . predniSONE (DELTASONE) 1 MG tablet Take 1 mg by mouth daily with breakfast.    . rosuvastatin (CRESTOR) 20 MG tablet Take 1 tablet (20 mg total) at bedtime by mouth. 90 tablet 3  . sitaGLIPtin (JANUVIA) 100 MG tablet Take 1 tablet (100 mg total) by mouth daily. 90 tablet 1  . vitamin B-12 (CYANOCOBALAMIN) 1000 MCG tablet Take 1,000 mcg by mouth daily.     No current facility-administered medications for this encounter.     Allergies  Allergen Reactions  . Bee Venom Swelling  . Ceclor [Cefaclor] Hives  . Ciprofloxacin Hives  . Shellfish Allergy Hives    Pt states it is  questionable// he eats seafood still without problem.      Social History   Socioeconomic History  . Marital status: Married    Spouse name: [name]  . Number of children: 0  . Years of education: 89  . Highest education level: Not on file  Occupational History    Comment: retired  Scientific laboratory technician  . Financial resource strain: Not on file  . Food insecurity:    Worry: Not on file    Inability: Not on file  . Transportation needs:    Medical: Not on file    Non-medical: Not on file  Tobacco Use  . Smoking status: Never Smoker  . Smokeless  tobacco: Never Used  Substance and Sexual Activity  . Alcohol use: Yes    Alcohol/week: 2.0 standard drinks    Types: 1 Glasses of wine, 1 Cans of beer per week    Comment: 1 beer/week  . Drug use: No  . Sexual activity: Not Currently  Lifestyle  . Physical activity:    Days per week: Not on file    Minutes per session: Not on file  . Stress: Not on file  Relationships  . Social connections:    Talks on phone: Not on file    Gets together: Not on file    Attends religious service: Not on file    Active member of club or organization: Not on file    Attends meetings of clubs or organizations: Not on file    Relationship status: Not on file  . Intimate partner violence:    Fear of current or ex partner: Not on file    Emotionally abused: Not on file    Physically abused: Not on file    Forced sexual activity: Not on file  Other Topics Concern  . Not on file  Social History Narrative   Retired. Lives with wife, [name].    Caffeine- coffee, 1 daily     Family History  Problem Relation Age of Onset  . Diabetes Mellitus II Father   . Heart failure Father   . Kidney failure Father   . Heart attack Mother    Vitals:   03/05/18 1410  BP: (!) 98/58  Pulse: 77  SpO2: 98%  Weight: 85.9 kg (189 lb 6 oz)    PHYSICAL EXAM: General:  Well appearing. No resp difficulty HEENT: normal Neck: supple. no JVD. Carotids 2+ bilat; no  bruits. No lymphadenopathy or thryomegaly appreciated. Cor: PMI nondisplaced. Regular rate & rhythm. No rubs, gallops or murmurs. Lungs: clear Abdomen: soft, nontender, nondistended. No hepatosplenomegaly. No bruits or masses. Good bowel sounds. Extremities: no cyanosis, clubbing, rash, edema Neuro: alert & orientedx3, cranial nerves grossly intact. moves all 4 extremities w/o difficulty. Affect pleasant  ECG: NSR 75 No ST-T wave abnormalities.    ASSESSMENT & PLAN: 1. CAD with progressive CP and dyspnea - S/P PCI of severe ostial stenosis in the left circumflex, with DES placed 10/18.  - Given worsening symptoms will proceed with relook cath with RHC as well. Also check echo. If cath stable consider CPX testing   - Low threshold to relook cath with any recurrent symptoms, but overall seems stable for now. Pt knows to call immediately with any worsening symptoms.  - Continue ASA and Plavix - Continue Crestor 20 mg daily 2. Hyperlipidemia - Continue crestor.  3. Diabetes  - Consider Jardiance   Glori Bickers, MD  4:12 PM

## 2018-03-05 NOTE — Patient Instructions (Addendum)
Labs today  Your physician has requested that you have an echocardiogram. Echocardiography is a painless test that uses sound waves to create images of your heart. It provides your doctor with information about the size and shape of your heart and how well your heart's chambers and valves are working. This procedure takes approximately one hour. There are no restrictions for this procedure.  Your physician recommends that you schedule a follow-up appointment in: 2-3 months   You are scheduled for a Cardiac Catheterization on Tuesday, October 15 with Dr. Glori Bickers.  1. Please arrive at the [facility] (Main Entrance A) at [facility]: [address] [place], [place] [address] at 10:00 AM (This time is two hours before your procedure to ensure your preparation). Free valet parking service is available.   Special note: Every effort is made to have your procedure done on time. Please understand that emergencies sometimes delay scheduled procedures.  2. Diet: Do not eat solid foods after midnight.  The patient may have clear liquids until 5am upon the day of the procedure.  3. Labs: Done Today  4. Medication instructions in preparation for your procedure:   Contrast Allergy: NO  Do NOT Take Metformin Tuesday 10/15 AM, Wed 10/16 or Thur 10/17, Restart Friday 10/18 Do NOT Take Januvia Tuesday 10/15 AM  On the morning of your procedure, take your Aspirin and Plavix/Clopidogrel and any morning medicines NOT listed above.  You may use sips of water.  5. Plan for one night stay--bring personal belongings. 6. Bring a current list of your medications and current insurance cards. 7. You MUST have a responsible person to drive you home. 8. Someone MUST be with you the first 24 hours after you arrive home or your discharge will be delayed. 9. Please wear clothes that are easy to get on and off and wear slip-on shoes.  Thank you for allowing [place] to care for you!   -- [place]  In[facility] Cardi[facility]

## 2018-03-13 ENCOUNTER — Encounter (HOSPITAL_COMMUNITY)
Admission: RE | Disposition: A | Discharge status: Home / Self Care | Payer: Self-pay | Source: Ambulatory Visit | Attending: Internal Medicine

## 2018-03-13 ENCOUNTER — Ambulatory Visit (HOSPITAL_COMMUNITY)
Admission: RE | Admit: 2018-03-13 | Discharge: 2018-03-13 | Disposition: A | Discharge status: Home / Self Care | Payer: Medicare Other | Source: Ambulatory Visit | Attending: Internal Medicine | Admitting: Internal Medicine

## 2018-03-13 ENCOUNTER — Other Ambulatory Visit: Payer: Self-pay

## 2018-03-13 DIAGNOSIS — Z833 Family history of diabetes mellitus: Secondary | ICD-10-CM | POA: Diagnosis not present

## 2018-03-13 DIAGNOSIS — K219 Gastro-esophageal reflux disease without esophagitis: Secondary | ICD-10-CM | POA: Insufficient documentation

## 2018-03-13 DIAGNOSIS — Z7984 Long term (current) use of oral hypoglycemic drugs: Secondary | ICD-10-CM | POA: Diagnosis not present

## 2018-03-13 DIAGNOSIS — I2510 Atherosclerotic heart disease of native coronary artery without angina pectoris: Secondary | ICD-10-CM | POA: Diagnosis not present

## 2018-03-13 DIAGNOSIS — Z79890 Hormone replacement therapy (postmenopausal): Secondary | ICD-10-CM | POA: Diagnosis not present

## 2018-03-13 DIAGNOSIS — Z7982 Long term (current) use of aspirin: Secondary | ICD-10-CM | POA: Insufficient documentation

## 2018-03-13 DIAGNOSIS — Z955 Presence of coronary angioplasty implant and graft: Secondary | ICD-10-CM | POA: Insufficient documentation

## 2018-03-13 DIAGNOSIS — Z881 Allergy status to other antibiotic agents status: Secondary | ICD-10-CM | POA: Diagnosis not present

## 2018-03-13 DIAGNOSIS — Z8719 Personal history of other diseases of the digestive system: Secondary | ICD-10-CM | POA: Diagnosis not present

## 2018-03-13 DIAGNOSIS — E119 Type 2 diabetes mellitus without complications: Secondary | ICD-10-CM | POA: Insufficient documentation

## 2018-03-13 DIAGNOSIS — R0789 Other chest pain: Secondary | ICD-10-CM | POA: Diagnosis present

## 2018-03-13 DIAGNOSIS — E039 Hypothyroidism, unspecified: Secondary | ICD-10-CM | POA: Insufficient documentation

## 2018-03-13 DIAGNOSIS — M1990 Unspecified osteoarthritis, unspecified site: Secondary | ICD-10-CM | POA: Insufficient documentation

## 2018-03-13 DIAGNOSIS — R079 Chest pain, unspecified: Secondary | ICD-10-CM

## 2018-03-13 DIAGNOSIS — M353 Polymyalgia rheumatica: Secondary | ICD-10-CM | POA: Insufficient documentation

## 2018-03-13 DIAGNOSIS — K589 Irritable bowel syndrome without diarrhea: Secondary | ICD-10-CM | POA: Insufficient documentation

## 2018-03-13 DIAGNOSIS — Z91030 Bee allergy status: Secondary | ICD-10-CM | POA: Insufficient documentation

## 2018-03-13 DIAGNOSIS — I25119 Atherosclerotic heart disease of native coronary artery with unspecified angina pectoris: Secondary | ICD-10-CM | POA: Insufficient documentation

## 2018-03-13 DIAGNOSIS — Z7902 Long term (current) use of antithrombotics/antiplatelets: Secondary | ICD-10-CM | POA: Insufficient documentation

## 2018-03-13 DIAGNOSIS — Z8249 Family history of ischemic heart disease and other diseases of the circulatory system: Secondary | ICD-10-CM | POA: Insufficient documentation

## 2018-03-13 DIAGNOSIS — Z79899 Other long term (current) drug therapy: Secondary | ICD-10-CM | POA: Diagnosis not present

## 2018-03-13 DIAGNOSIS — Z91013 Allergy to seafood: Secondary | ICD-10-CM | POA: Diagnosis not present

## 2018-03-13 DIAGNOSIS — E7800 Pure hypercholesterolemia, unspecified: Secondary | ICD-10-CM | POA: Diagnosis not present

## 2018-03-13 HISTORY — PX: RIGHT/LEFT HEART CATH AND CORONARY ANGIOGRAPHY: CATH118266

## 2018-03-13 LAB — POCT I-STAT 3, ART BLOOD GAS (G3+)
ACID-BASE DEFICIT: 3 mmol/L — AB (ref 0.0–2.0)
BICARBONATE: 21.5 mmol/L (ref 20.0–28.0)
O2 Saturation: 93 %
PO2 ART: 68 mmHg — AB (ref 83.0–108.0)
TCO2: 23 mmol/L (ref 22–32)
pCO2 arterial: 36.3 mmHg (ref 32.0–48.0)
pH, Arterial: 7.38 (ref 7.350–7.450)

## 2018-03-13 LAB — POCT I-STAT 3, VENOUS BLOOD GAS (G3P V)
Acid-base deficit: 2 mmol/L (ref 0.0–2.0)
Acid-base deficit: 2 mmol/L (ref 0.0–2.0)
BICARBONATE: 23.4 mmol/L (ref 20.0–28.0)
Bicarbonate: 23.6 mmol/L (ref 20.0–28.0)
O2 Saturation: 65 %
O2 Saturation: 68 %
PCO2 VEN: 40.8 mmHg — AB (ref 44.0–60.0)
PH VEN: 7.366 (ref 7.250–7.430)
TCO2: 25 mmol/L (ref 22–32)
TCO2: 25 mmol/L (ref 22–32)
pCO2, Ven: 40.9 mmHg — ABNORMAL LOW (ref 44.0–60.0)
pH, Ven: 7.371 (ref 7.250–7.430)
pO2, Ven: 35 mmHg (ref 32.0–45.0)
pO2, Ven: 37 mmHg (ref 32.0–45.0)

## 2018-03-13 LAB — GLUCOSE, CAPILLARY: Glucose-Capillary: 111 mg/dL — ABNORMAL HIGH (ref 70–99)

## 2018-03-13 SURGERY — RIGHT/LEFT HEART CATH AND CORONARY ANGIOGRAPHY
Anesthesia: LOCAL

## 2018-03-13 MED ORDER — MIDAZOLAM HCL 2 MG/2ML IJ SOLN
INTRAMUSCULAR | Status: DC | PRN
  Administered 2018-03-13: 1 mg via INTRAVENOUS

## 2018-03-13 MED ORDER — VERAPAMIL HCL 2.5 MG/ML IV SOLN
INTRAVENOUS | Status: DC | PRN
  Administered 2018-03-13: 10 mL via INTRA_ARTERIAL

## 2018-03-13 MED ORDER — SODIUM CHLORIDE 0.9% FLUSH: 3.0000 mL | INTRAVENOUS | Status: DC | PRN

## 2018-03-13 MED ORDER — FENTANYL CITRATE (PF) 100 MCG/2ML IJ SOLN
INTRAMUSCULAR | Status: AC
  Filled 2018-03-13: qty 2

## 2018-03-13 MED ORDER — ASPIRIN 81 MG PO CHEW
CHEWABLE_TABLET | ORAL | Status: AC
  Filled 2018-03-13: qty 1

## 2018-03-13 MED ORDER — SODIUM CHLORIDE 0.9% FLUSH: 3.0000 mL | Freq: Two times a day (BID) | INTRAVENOUS | Status: DC

## 2018-03-13 MED ORDER — IOHEXOL 350 MG/ML SOLN
INTRAVENOUS | Status: DC | PRN
  Administered 2018-03-13: 35 mL via INTRA_ARTERIAL

## 2018-03-13 MED ORDER — VERAPAMIL HCL 2.5 MG/ML IV SOLN
INTRAVENOUS | Status: AC
  Filled 2018-03-13: qty 2

## 2018-03-13 MED ORDER — SODIUM CHLORIDE 0.9 % IV SOLN: INTRAVENOUS | Status: AC

## 2018-03-13 MED ORDER — HEPARIN (PORCINE) IN NACL 1000-0.9 UT/500ML-% IV SOLN
INTRAVENOUS | Status: AC
  Filled 2018-03-13: qty 1000

## 2018-03-13 MED ORDER — SODIUM CHLORIDE 0.9 % IV SOLN: 250.0000 mL | INTRAVENOUS | Status: DC | PRN

## 2018-03-13 MED ORDER — HEPARIN (PORCINE) IN NACL 1000-0.9 UT/500ML-% IV SOLN
INTRAVENOUS | Status: DC | PRN
  Administered 2018-03-13 (×2): 500 mL

## 2018-03-13 MED ORDER — ASPIRIN 81 MG PO CHEW
81.0000 mg | CHEWABLE_TABLET | Freq: Once | ORAL | Status: AC
  Administered 2018-03-13: 81 mg via ORAL

## 2018-03-13 MED ORDER — SODIUM CHLORIDE 0.9 % IV SOLN
INTRAVENOUS | Status: DC
  Administered 2018-03-13: 10:00:00 via INTRAVENOUS

## 2018-03-13 MED ORDER — ACETAMINOPHEN 325 MG PO TABS: 650.0000 mg | ORAL_TABLET | ORAL | Status: DC | PRN

## 2018-03-13 MED ORDER — MIDAZOLAM HCL 2 MG/2ML IJ SOLN
INTRAMUSCULAR | Status: AC
  Filled 2018-03-13: qty 2

## 2018-03-13 MED ORDER — LIDOCAINE HCL (PF) 1 % IJ SOLN
INTRAMUSCULAR | Status: DC | PRN
  Administered 2018-03-13 (×2): 2 mL

## 2018-03-13 MED ORDER — LIDOCAINE HCL (PF) 1 % IJ SOLN
INTRAMUSCULAR | Status: AC
  Filled 2018-03-13: qty 30

## 2018-03-13 MED ORDER — HEPARIN SODIUM (PORCINE) 1000 UNIT/ML IJ SOLN
INTRAMUSCULAR | Status: DC | PRN
  Administered 2018-03-13: 4000 [IU] via INTRAVENOUS

## 2018-03-13 MED ORDER — FENTANYL CITRATE (PF) 100 MCG/2ML IJ SOLN
INTRAMUSCULAR | Status: DC | PRN
  Administered 2018-03-13: 25 ug via INTRAVENOUS

## 2018-03-13 MED ORDER — ONDANSETRON HCL 4 MG/2ML IJ SOLN: 4.0000 mg | Freq: Four times a day (QID) | INTRAMUSCULAR | Status: DC | PRN

## 2018-03-13 SURGICAL SUPPLY — 11 items
CATH 5FR JL3.5 JR4 ANG PIG MP (CATHETERS) ×2 IMPLANT
CATH BALLN WEDGE 5F 110CM (CATHETERS) ×2 IMPLANT
DEVICE RAD COMP TR BAND LRG (VASCULAR PRODUCTS) ×2 IMPLANT
GLIDESHEATH SLEND SS 6F .021 (SHEATH) ×2 IMPLANT
GUIDEWIRE INQWIRE 1.5J.035X260 (WIRE) ×1 IMPLANT
INQWIRE 1.5J .035X260CM (WIRE) ×2
KIT HEART LEFT (KITS) ×2 IMPLANT
PACK CARDIAC CATHETERIZATION (CUSTOM PROCEDURE TRAY) ×2 IMPLANT
SHEATH GLIDE SLENDER 4/5FR (SHEATH) ×2 IMPLANT
TRANSDUCER W/STOPCOCK (MISCELLANEOUS) ×2 IMPLANT
TUBING CIL FLEX 10 FLL-RA (TUBING) IMPLANT

## 2018-03-13 NOTE — Progress Notes (Signed)
TRB removed, gauze with tegaderm placed, arm board placed, site WNL.  Will continue to monitor

## 2018-03-13 NOTE — Interval H&P Note (Signed)
History and Physical Interval Note:  03/13/2018 10:31 AM  Caleb Butler  has presented today for surgery, with the diagnosis of cp  The various methods of treatment have been discussed with the patient and family. After consideration of risks, benefits and other options for treatment, the patient has consented to  Procedure(s): RIGHT/LEFT HEART CATH AND CORONARY ANGIOGRAPHY (N/A) and possible coronary angioplasty as a surgical intervention .  The patient's history has been reviewed, patient examined, no change in status, stable for surgery.  I have reviewed the patient's chart and labs.  Questions were answered to the patient's satisfaction.     Ginette Bradway

## 2018-03-13 NOTE — Discharge Instructions (Signed)

## 2018-03-14 ENCOUNTER — Encounter (HOSPITAL_COMMUNITY): Payer: Self-pay | Admitting: Internal Medicine

## 2018-03-14 ENCOUNTER — Other Ambulatory Visit: Payer: Self-pay | Admitting: Student

## 2018-03-14 DIAGNOSIS — I2510 Atherosclerotic heart disease of native coronary artery without angina pectoris: Secondary | ICD-10-CM

## 2018-03-20 ENCOUNTER — Ambulatory Visit (HOSPITAL_COMMUNITY)
Admission: RE | Admit: 2018-03-20 | Discharge: 2018-03-20 | Disposition: A | Discharge status: Home / Self Care | Payer: Medicare Other | Source: Ambulatory Visit | Attending: Internal Medicine | Admitting: Internal Medicine

## 2018-03-20 DIAGNOSIS — I25119 Atherosclerotic heart disease of native coronary artery with unspecified angina pectoris: Secondary | ICD-10-CM | POA: Diagnosis not present

## 2018-03-20 DIAGNOSIS — I080 Rheumatic disorders of both mitral and aortic valves: Secondary | ICD-10-CM | POA: Diagnosis not present

## 2018-03-20 DIAGNOSIS — I208 Other forms of angina pectoris: Secondary | ICD-10-CM | POA: Diagnosis not present

## 2018-03-20 DIAGNOSIS — E119 Type 2 diabetes mellitus without complications: Secondary | ICD-10-CM | POA: Insufficient documentation

## 2018-03-20 DIAGNOSIS — R0609 Other forms of dyspnea: Secondary | ICD-10-CM | POA: Insufficient documentation

## 2018-03-20 DIAGNOSIS — E785 Hyperlipidemia, unspecified: Secondary | ICD-10-CM | POA: Diagnosis not present

## 2018-03-20 NOTE — Progress Notes (Signed)
*  PRELIMINARY RESULTS* Echocardiogram 2D Echocardiogram has been performed.  Caleb Butler 03/20/2018, 10:57 AM

## 2018-03-23 ENCOUNTER — Telehealth (HOSPITAL_COMMUNITY): Payer: Self-pay

## 2018-03-23 NOTE — Telephone Encounter (Signed)
Pt was called with ECHO results normal

## 2018-04-02 ENCOUNTER — Other Ambulatory Visit: Payer: Self-pay | Admitting: Family Medicine

## 2018-04-12 ENCOUNTER — Other Ambulatory Visit (HOSPITAL_COMMUNITY): Payer: Self-pay

## 2018-04-12 MED ORDER — ROSUVASTATIN CALCIUM 20 MG PO TABS: 20.0000 mg | ORAL_TABLET | Freq: Every day | ORAL | 3 refills | Status: DC

## 2018-04-16 ENCOUNTER — Other Ambulatory Visit (HOSPITAL_COMMUNITY): Payer: Self-pay | Admitting: *Deleted

## 2018-04-16 MED ORDER — ROSUVASTATIN CALCIUM 20 MG PO TABS: 20.0000 mg | ORAL_TABLET | Freq: Every day | ORAL | 3 refills | Status: DC

## 2018-04-19 ENCOUNTER — Other Ambulatory Visit: Payer: Self-pay | Admitting: Family Medicine

## 2018-04-19 DIAGNOSIS — W5512XA Struck by horse, initial encounter: Secondary | ICD-10-CM

## 2018-04-19 NOTE — Progress Notes (Signed)
Patient had appointment originally scheduled for tomorrow at 1130.  Fortunately I am unable to keep that appointment due to to unforeseen circumstances.  Therefore I called the patient tonight in an effort to try to answer his medical questions.  2 weeks ago he was kicked in the left thigh by horse.  He was kicked 9 from his knee to his hip.  There is now a large hematoma in that area that is starting to turn greenish-yellow.  However he states that the hematoma is the size of a football and that he is continuing to have pain in the middle part of his thigh.  The pain tracks down his thigh to his lateral knee.  I suspect that it is due to pressure from the hematoma.  Given the fact he is on aspirin and Plavix, I want him to come into the office tomorrow for a CBC to rule out significant blood loss that may require [place] to temporarily discontinue his aspirin and Plavix.  I would also obtain an x-ray of his leg to rule out a fracture.  I have placed these orders in epic and the patient will go get these procedures done since I unfortunately had to cancel his appointment.  Patient was comfortable with plan.

## 2018-04-20 ENCOUNTER — Ambulatory Visit (HOSPITAL_COMMUNITY)
Admission: RE | Admit: 2018-04-20 | Discharge: 2018-04-20 | Disposition: A | Discharge status: Home / Self Care | Payer: Medicare Other | Source: Ambulatory Visit | Attending: Family Medicine | Admitting: Family Medicine

## 2018-04-20 ENCOUNTER — Other Ambulatory Visit: Payer: Medicare Other

## 2018-04-20 ENCOUNTER — Ambulatory Visit: Payer: Medicare Other | Admitting: Family Medicine

## 2018-04-20 DIAGNOSIS — M79652 Pain in left thigh: Secondary | ICD-10-CM | POA: Diagnosis not present

## 2018-04-20 DIAGNOSIS — W5512XA Struck by horse, initial encounter: Secondary | ICD-10-CM

## 2018-04-20 DIAGNOSIS — Z7902 Long term (current) use of antithrombotics/antiplatelets: Secondary | ICD-10-CM | POA: Diagnosis not present

## 2018-04-20 DIAGNOSIS — Z7982 Long term (current) use of aspirin: Secondary | ICD-10-CM | POA: Diagnosis not present

## 2018-04-20 LAB — CBC WITH DIFFERENTIAL/PLATELET
Basophils Absolute: 69 cells/uL (ref 0–200)
Basophils Relative: 1.1 %
Eosinophils Absolute: 202 cells/uL (ref 15–500)
Eosinophils Relative: 3.2 %
HCT: 40.3 % (ref 38.5–50.0)
HEMOGLOBIN: 13.8 g/dL (ref 13.2–17.1)
Lymphs Abs: 1084 cells/uL (ref 850–3900)
MCH: 33.2 pg — ABNORMAL HIGH (ref 27.0–33.0)
MCHC: 34.2 g/dL (ref 32.0–36.0)
MCV: 96.9 fL (ref 80.0–100.0)
MONOS PCT: 6.8 %
MPV: 11.4 fL (ref 7.5–12.5)
NEUTROS ABS: 4517 {cells}/uL (ref 1500–7800)
Neutrophils Relative %: 71.7 %
Platelets: 233 10*3/uL (ref 140–400)
RBC: 4.16 10*6/uL — AB (ref 4.20–5.80)
RDW: 13.1 % (ref 11.0–15.0)
Total Lymphocyte: 17.2 %
WBC mixed population: 428 cells/uL (ref 200–950)
WBC: 6.3 10*3/uL (ref 3.8–10.8)

## 2018-05-09 ENCOUNTER — Encounter (HOSPITAL_COMMUNITY): Payer: Self-pay | Admitting: Internal Medicine

## 2018-05-09 ENCOUNTER — Ambulatory Visit (HOSPITAL_COMMUNITY)
Admission: RE | Admit: 2018-05-09 | Discharge: 2018-05-09 | Disposition: A | Discharge status: Home / Self Care | Payer: Medicare Other | Source: Ambulatory Visit | Attending: Internal Medicine | Admitting: Internal Medicine

## 2018-05-09 VITALS — BP 122/78 | HR 80 | Wt 189.8 lb

## 2018-05-09 DIAGNOSIS — Z7982 Long term (current) use of aspirin: Secondary | ICD-10-CM | POA: Insufficient documentation

## 2018-05-09 DIAGNOSIS — Z833 Family history of diabetes mellitus: Secondary | ICD-10-CM | POA: Diagnosis not present

## 2018-05-09 DIAGNOSIS — K219 Gastro-esophageal reflux disease without esophagitis: Secondary | ICD-10-CM | POA: Insufficient documentation

## 2018-05-09 DIAGNOSIS — E7800 Pure hypercholesterolemia, unspecified: Secondary | ICD-10-CM | POA: Diagnosis not present

## 2018-05-09 DIAGNOSIS — I712 Thoracic aortic aneurysm, without rupture: Secondary | ICD-10-CM | POA: Diagnosis not present

## 2018-05-09 DIAGNOSIS — R0609 Other forms of dyspnea: Secondary | ICD-10-CM

## 2018-05-09 DIAGNOSIS — Z8249 Family history of ischemic heart disease and other diseases of the circulatory system: Secondary | ICD-10-CM | POA: Insufficient documentation

## 2018-05-09 DIAGNOSIS — Z7902 Long term (current) use of antithrombotics/antiplatelets: Secondary | ICD-10-CM | POA: Diagnosis not present

## 2018-05-09 DIAGNOSIS — E119 Type 2 diabetes mellitus without complications: Secondary | ICD-10-CM | POA: Diagnosis not present

## 2018-05-09 DIAGNOSIS — E785 Hyperlipidemia, unspecified: Secondary | ICD-10-CM | POA: Insufficient documentation

## 2018-05-09 DIAGNOSIS — Z955 Presence of coronary angioplasty implant and graft: Secondary | ICD-10-CM | POA: Insufficient documentation

## 2018-05-09 DIAGNOSIS — Z7984 Long term (current) use of oral hypoglycemic drugs: Secondary | ICD-10-CM | POA: Insufficient documentation

## 2018-05-09 DIAGNOSIS — Z881 Allergy status to other antibiotic agents status: Secondary | ICD-10-CM | POA: Insufficient documentation

## 2018-05-09 DIAGNOSIS — M4690 Unspecified inflammatory spondylopathy, site unspecified: Secondary | ICD-10-CM | POA: Diagnosis not present

## 2018-05-09 DIAGNOSIS — Z79899 Other long term (current) drug therapy: Secondary | ICD-10-CM | POA: Insufficient documentation

## 2018-05-09 DIAGNOSIS — M353 Polymyalgia rheumatica: Secondary | ICD-10-CM | POA: Diagnosis not present

## 2018-05-09 DIAGNOSIS — I2510 Atherosclerotic heart disease of native coronary artery without angina pectoris: Secondary | ICD-10-CM | POA: Insufficient documentation

## 2018-05-09 DIAGNOSIS — I714 Abdominal aortic aneurysm, without rupture: Secondary | ICD-10-CM | POA: Insufficient documentation

## 2018-05-09 DIAGNOSIS — E039 Hypothyroidism, unspecified: Secondary | ICD-10-CM | POA: Insufficient documentation

## 2018-05-09 NOTE — Patient Instructions (Signed)
Follow up with [name] in 6 months

## 2018-05-09 NOTE — Progress Notes (Signed)
Advanced Heart Failure Clinic Note   Referring Physician: Dr. Domenic Polite Primary Care: Jenna Luo, MD Primary Cardiologist: Dr. Domenic Polite  HPI:  Caleb Butler is a 72 y.o. male with history of long-standing, progressive dyspnea on exertion (NYHA 2-3), Hypothyroidism, DM2, and HLD.  Right and left heart cath in Feb. 2018 showed a 90% ostial LCx lesion that involved the ostium of the Ramus as well. Also with a 40% proximal RCA lesion. It was felt at the time that this was not favorable to PCI but he continues to have symptoms and underwent PCI of ostial LCX with cutting balloon and DES on 01/06/17.   Here for routine f/u. We saw him in 10/19 for worsening CP and SOB. Underwent re-look cath which was very reassuring Says he is feeling better but still with some DOE. One episode of chest pressure. 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 No edema, orthopnea or PND. Finished CR in March but hasn't exercised regularly since. Taking all medicines regularly.  Review of systems complete and found to be negative unless listed in HPI.   01/06/2017  Successful PCI of severe ostial stenosis in the left circumflex, DES placed.  Recommendations: Aspirin and Plavix for a minimum of 12 months. If tolerated favor long-term DAPT as the stent is positioned at the left main bifurcation.  Relook cath 03/13/18  Findings:  RA = 7 RV = 32/9 PA = 33/10 (20) PCW = 10 Fick cardiac output/index = 5.4/2.7 PVR = 1.9 WU Ao sat = 93% PA sat = 64%, 68%  Assessment: 1. CAD with widely patent stent in ostial LCX 2. Unchanged 90% lesion in ostium of moderate-sized first diagonal 3. Otherwise mild non-obstructive CAD 4. EF 55-60% 6. Normal RHC numbers  Cardiac cath 07/07/16   The left ventricular ejection fraction is 55-65% by visual estimate.  Prox RCA lesion, 40  %stenosed.  Ost Cx lesion, 90 %stenosed.  Ramus lesion, 90 %stenosed.  LM lesion, 20 %stenosed.  Mid LAD lesion, 20 %stenosed.  1st Diag lesion, 80 %stenosed.  Findings: RA = 5 RV = 23/8 PA = 27/4 (16) PCW = 3 Fick cardiac output/index = 5.2/2.6 PVR = 2.5 WU  Ao sat =95% PA sat = 68%, 69% Assessment: 1. 1-V CAD at ostium of LCX also involving ostium of ramus 2. Aneursymal proximal LAD with mild plaque 3. 40% Proximal RCA 4. Normal LVEF with normal hemodynamics  Echo 02/2016 LVEF 60-65%, Grade 2 DD, Mild AI, Trivial MR, Pa pear pressure 24 mm Hg. Normal RV.  High res Chest CT 03/13/2016 - No evidence of ILD. Mild patchy bibasilar scarring and volume loss. Incidental finding of AAA. + coronary artery calcification, + right adrenal myelolipoma.   PFTS 11/2015 FVC 2.75 (64%) FEV1 2.05 (65%) TLC 5.26 (77%) DLCO 20.70 (66%) -> corrects to 100% with alveolar volume.   CPX 05/06/16 Pre-Exercise PFTs  FVC 3.05 (74%)    FEV1 2.19 (70%)       FEV1/FVC 72 (94%)     MVV 86 (69%) Post-Exercise PFTs ((from lowest post-exercise trial (%change from rest)) FVC performed IPE, 5, 10, 15 mins FVC 3.19 (+6%) IPE    FEV1 2.44 (+11%)  10 mins     FEV1/FVC 73 (+1%)    Exercise Time:  8:30      Watts: 120 RPE: 19 Reason stopped: Patient ended test due to  dyspnea (9/10) and leg fatigue Additional symptoms: Back pain (7-8/10) with weight on left leg Resting HR: 79 Peak HR: 135  (89% age predicted max HR) BP rest: 134/66 BP peak: 128/66 Peak VO2: 19.8 (86% predicted peak VO2) VE/VCO2 slope: 44 OUES: 1.46 Peak RER: 1.11 Ventilatory Threshold: 15.2 (66% predicted or measured peak VO2) Peak RR 53 Peak Ventilation: 88.8 VE/MVV: 103% PETCO2 at peak: 22 O2pulse: 12  (92% predicted O2pulse)  Family History: Father had CHF (unclear etiology) and CKD (Passed at 98 yo) Mother passed of MI at 62 yo.  Social History: Lives with wife in [place]. Raises  Livestock.  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    Past Medical History:  Diagnosis Date  . Anxiety   . Arthritis    "my back is loaded w/it" (01/06/2017)  . CAD (coronary artery disease)    80% ostial lesion in L circumflex (2017)  . Chronic lower back pain   . GERD (gastroesophageal reflux disease)   . Hiatal hernia   . High cholesterol   . Hypothyroidism   . IBS (irritable bowel syndrome)   . Numbness and tingling of right lower extremity    "since 11/30/2016" (01/06/2017)  . PMR (polymyalgia rheumatica) (HCC)   . Type 2 diabetes mellitus ([place])     Current Outpatient Medications  Medication Sig Dispense Refill  . acetaminophen (TYLENOL) 500 MG tablet Take 500-1,000 mg by mouth daily as needed for moderate pain or headache.    Marland Kitchen aspirin 81 MG EC tablet Take 81 mg by mouth at bedtime.     . clopidogrel (PLAVIX) 75 MG tablet TAKE 1 TABLET BY MOUTH EVERY DAY 90 tablet 3  . folic acid (FOLVITE) 1 MG tablet Take 1 mg by mouth daily.    Marland Kitchen glucose blood test strip Check BS BID Dx: E11.9 100 each 5  . Lancets (ACCU-CHEK MULTICLIX) lancets Check BS BID Dx: E11.9 100 each 12  . levothyroxine (SYNTHROID, LEVOTHROID) 100 MCG tablet TAKE 1 TABLET (100 MCG TOTAL) BY MOUTH DAILY BEFORE BREAKFAST. 90 tablet 3  . metFORMIN (GLUCOPHAGE) 1000 MG tablet TAKE 1 TABLET (1,000 MG TOTAL) BY MOUTH 2 (TWO) TIMES DAILY WITH A MEAL. 180 tablet 3  . methotrexate (RHEUMATREX) 2.5 MG tablet Take 15 mg by mouth every Wednesday. Caution:Chemotherapy. Protect from light.     . Multiple Vitamin (MULTIVITAMIN WITH MINERALS) TABS tablet Take 1 tablet by mouth daily.    . rosuvastatin (CRESTOR) 20 MG tablet Take 1 tablet (20 mg total) by mouth at bedtime. 90 tablet 3  . sitaGLIPtin (JANUVIA) 100 MG tablet Take 1 tablet (100 mg total) by mouth daily. 90 tablet 1  . vitamin B-12 (CYANOCOBALAMIN) 1000 MCG tablet Take 1,000 mcg by mouth daily.     No current facility-administered  medications for this encounter.     Allergies  Allergen Reactions  . Bee Venom Swelling  . Ceclor [Cefaclor] Hives  . Ciprofloxacin Hives      Social History   Socioeconomic History  . Marital status: Married    Spouse name: [name]  . Number of children: 0  . Years of education: 4  . Highest education level: Not on file  Occupational History    Comment: retired  Scientific laboratory technician  . Financial resource strain: Not on file  . Food insecurity:    Worry: Not on file    Inability: Not on file  . Transportation needs:    Medical: Not on file    Non-medical: Not  on file  Tobacco Use  . Smoking status: Never Smoker  . Smokeless tobacco: Never Used  Substance and Sexual Activity  . Alcohol use: Yes    Alcohol/week: 2.0 standard drinks    Types: 1 Glasses of wine, 1 Cans of beer per week    Comment: 1 beer/week  . Drug use: No  . Sexual activity: Not Currently  Lifestyle  . Physical activity:    Days per week: Not on file    Minutes per session: Not on file  . Stress: Not on file  Relationships  . Social connections:    Talks on phone: Not on file    Gets together: Not on file    Attends religious service: Not on file    Active member of club or organization: Not on file    Attends meetings of clubs or organizations: Not on file    Relationship status: Not on file  . Intimate partner violence:    Fear of current or ex partner: Not on file    Emotionally abused: Not on file    Physically abused: Not on file    Forced sexual activity: Not on file  Other Topics Concern  . Not on file  Social History Narrative   Retired. Lives with wife, [name].    Caffeine- coffee, 1 daily     Family History  Problem Relation Age of Onset  . Diabetes Mellitus II Father   . Heart failure Father   . Kidney failure Father   . Heart attack Mother    Vitals:   05/09/18 1343  BP: 122/78  Pulse: 80  SpO2: 97%  Weight: 86.1 kg (189 lb 12.8 oz)    PHYSICAL EXAM: General:  Well  appearing. No resp difficulty HEENT: normal Neck: supple. no JVD. Carotids 2+ bilat; no bruits. No lymphadenopathy or thryomegaly appreciated. Cor: PMI nondisplaced. Regular rate & rhythm. No rubs, gallops or murmurs. Lungs: clear Abdomen: soft, nontender, nondistended. No hepatosplenomegaly. No bruits or masses. Good bowel sounds. Extremities: no cyanosis, clubbing, rash, edema Neuro: alert & orientedx3, cranial nerves grossly intact. moves all 4 extremities w/o difficulty. Affect pleasant  ASSESSMENT & PLAN: 1. CAD with progressive CP and dyspnea - S/P PCI of severe ostial stenosis in the left circumflex, with DES placed 10/18.  - Given worsening symptoms will proceed with relook cath with RHC as well. Also check echo. If cath stable consider CPX testing   - Relook cath 10/19 very reassuring doubt symptoms cardiac in nature. Stressed need for more physical activity.  - Continue ASA and Plavix - Continue Crestor 20 mg daily 2. Hyperlipidemia - Continue crestor.  - Follow by Dr. Dennard Schaumann. Goal LDL < 70 3. Diabetes  - Continue Jardiance  4. Ascending aortic aneurysm - CT chest 4/17 at 4.3cm - Aortic root normal on echo 10/19  - Follow with serial echos. Repeat CT as needed.    Glori Bickers, MD  1:59 PM

## 2018-06-20 ENCOUNTER — Encounter: Payer: Self-pay | Admitting: Orthopaedic Surgery

## 2018-06-20 ENCOUNTER — Ambulatory Visit: Payer: Medicare Other

## 2018-06-20 ENCOUNTER — Ambulatory Visit: Payer: Medicare Other | Admitting: Orthopaedic Surgery

## 2018-06-20 ENCOUNTER — Ambulatory Visit (INDEPENDENT_AMBULATORY_CARE_PROVIDER_SITE_OTHER): Payer: Medicare Other

## 2018-06-20 VITALS — BP 113/61 | HR 79 | Ht 69.0 in | Wt 190.0 lb

## 2018-06-20 DIAGNOSIS — M25512 Pain in left shoulder: Secondary | ICD-10-CM | POA: Diagnosis not present

## 2018-06-20 DIAGNOSIS — G8929 Other chronic pain: Secondary | ICD-10-CM | POA: Diagnosis not present

## 2018-06-20 DIAGNOSIS — M25511 Pain in right shoulder: Principal | ICD-10-CM

## 2018-06-20 NOTE — Progress Notes (Signed)
Subjective:    Patient ID: Caleb Butler, male    DOB: 1946-03-21, 73 y.o.   MRN: [id number]  HPI He has had shoulder pain of both shoulders, more on the right than left, for several months getting worse.  He has no trauma, no redness, no numbness.  He has pain with overhead use and rolling over on the right shoulder at night.  He is on Plavix and other heart medicines.  He is tired of his shoulders hurting.   Review of Systems  Constitutional: Positive for activity change.  Musculoskeletal: Positive for arthralgias and back pain.  Psychiatric/Behavioral: The patient is nervous/anxious.   All other systems reviewed and are negative.  For Review of Systems, all other systems reviewed and are negative.  The following is a summary of the past history medically, past history surgically, known current medicines, social history and family history.  This information is gathered electronically by the computer from prior information and documentation.  I review this each visit and have found including this information at this point in the chart is beneficial and informative.   Past Medical History:  Diagnosis Date  . Anxiety   . Arthritis    "my back is loaded w/it" (01/06/2017)  . CAD (coronary artery disease)    80% ostial lesion in L circumflex (2017)  . Chronic lower back pain   . GERD (gastroesophageal reflux disease)   . Hiatal hernia   . High cholesterol   . Hypothyroidism   . IBS (irritable bowel syndrome)   . Numbness and tingling of right lower extremity    "since 11/30/2016" (01/06/2017)  . PMR (polymyalgia rheumatica) (HCC)   . Type 2 diabetes mellitus ([place])     Past Surgical History:  Procedure Laterality Date  . CARDIAC CATHETERIZATION  06/2016  . CORONARY ANGIOPLASTY WITH STENT PLACEMENT  01/06/2017  . CORONARY STENT INTERVENTION N/A 01/06/2017   Procedure: CORONARY STENT INTERVENTION;  Surgeon: Sherren Mocha, MD;  Location: [facility];  Service:  Cardiovascular;  Laterality: N/A;  . FINGER SURGERY Left 1984   ring finger reattached.   . FOREARM FRACTURE SURGERY Right 2004   "has 3 rods and 22 screws in it"  . INGUINAL HERNIA REPAIR Left   . RIGHT/LEFT HEART CATH AND CORONARY ANGIOGRAPHY N/A 07/07/2016   Procedure: Right/Left Heart Cath and Coronary Angiography;  Surgeon: Jolaine Artist, MD;  Location: [facility];  Service: Cardiovascular;  Laterality: N/A;  . RIGHT/LEFT HEART CATH AND CORONARY ANGIOGRAPHY N/A 03/13/2018   Procedure: RIGHT/LEFT HEART CATH AND CORONARY ANGIOGRAPHY;  Surgeon: Jolaine Artist, MD;  Location: [facility];  Service: Cardiovascular;  Laterality: N/A;    Current Outpatient Medications on File Prior to Visit  Medication Sig Dispense Refill  . acetaminophen (TYLENOL) 500 MG tablet Take 500-1,000 mg by mouth daily as needed for moderate pain or headache.    Marland Kitchen aspirin 81 MG EC tablet Take 81 mg by mouth at bedtime.     . clopidogrel (PLAVIX) 75 MG tablet TAKE 1 TABLET BY MOUTH EVERY DAY 90 tablet 3  . folic acid (FOLVITE) 1 MG tablet Take 1 mg by mouth daily.    Marland Kitchen glucose blood test strip Check BS BID Dx: E11.9 100 each 5  . Lancets (ACCU-CHEK MULTICLIX) lancets Check BS BID Dx: E11.9 100 each 12  . levothyroxine (SYNTHROID, LEVOTHROID) 100 MCG tablet TAKE 1 TABLET (100 MCG TOTAL) BY MOUTH DAILY BEFORE BREAKFAST. 90 tablet 3  .  metFORMIN (GLUCOPHAGE) 1000 MG tablet TAKE 1 TABLET (1,000 MG TOTAL) BY MOUTH 2 (TWO) TIMES DAILY WITH A MEAL. 180 tablet 3  . methotrexate (RHEUMATREX) 2.5 MG tablet Take 15 mg by mouth every Wednesday. Caution:Chemotherapy. Protect from light.     . Multiple Vitamin (MULTIVITAMIN WITH MINERALS) TABS tablet Take 1 tablet by mouth daily.    . rosuvastatin (CRESTOR) 20 MG tablet Take 1 tablet (20 mg total) by mouth at bedtime. 90 tablet 3  . sitaGLIPtin (JANUVIA) 100 MG tablet Take 1 tablet (100 mg total) by mouth daily. 90 tablet 1  . vitamin B-12 (CYANOCOBALAMIN)  1000 MCG tablet Take 1,000 mcg by mouth daily.     No current facility-administered medications on file prior to visit.     Social History   Socioeconomic History  . Marital status: Married    Spouse name: [name]  . Number of children: 0  . Years of education: 54  . Highest education level: Not on file  Occupational History    Comment: retired  Scientific laboratory technician  . Financial resource strain: Not on file  . Food insecurity:    Worry: Not on file    Inability: Not on file  . Transportation needs:    Medical: Not on file    Non-medical: Not on file  Tobacco Use  . Smoking status: Never Smoker  . Smokeless tobacco: Never Used  Substance and Sexual Activity  . Alcohol use: Yes    Alcohol/week: 2.0 standard drinks    Types: 1 Glasses of wine, 1 Cans of beer per week    Comment: 1 beer/week  . Drug use: No  . Sexual activity: Not Currently  Lifestyle  . Physical activity:    Days per week: Not on file    Minutes per session: Not on file  . Stress: Not on file  Relationships  . Social connections:    Talks on phone: Not on file    Gets together: Not on file    Attends religious service: Not on file    Active member of club or organization: Not on file    Attends meetings of clubs or organizations: Not on file    Relationship status: Not on file  . Intimate partner violence:    Fear of current or ex partner: Not on file    Emotionally abused: Not on file    Physically abused: Not on file    Forced sexual activity: Not on file  Other Topics Concern  . Not on file  Social History Narrative   Retired. Lives with wife, [name].    Caffeine- coffee, 1 daily    Family History  Problem Relation Age of Onset  . Diabetes Mellitus II Father   . Heart failure Father   . Kidney failure Father   . Heart attack Mother     BP 113/61   Pulse 79   Ht 5\' 9"  (1.753 m)   Wt 190 lb (86.2 kg)   BMI 28.06 kg/m   Body mass index is 28.06 kg/m.     Objective:   Physical  Exam Constitutional:      Appearance: He is well-developed.  HENT:     Head: Normocephalic and atraumatic.  Eyes:     Conjunctiva/sclera: Conjunctivae normal.     Pupils: Pupils are equal, round, and reactive to light.  Neck:     Musculoskeletal: Normal range of motion and neck supple.  Cardiovascular:     Rate and Rhythm: Normal rate and  regular rhythm.  Pulmonary:     Effort: Pulmonary effort is normal.  Abdominal:     Palpations: Abdomen is soft.  Musculoskeletal:     Right shoulder: He exhibits tenderness.     Left shoulder: He exhibits tenderness.       Arms:  Skin:    General: Skin is warm and dry.  Neurological:     Mental Status: He is alert and oriented to person, place, and time.     Cranial Nerves: No cranial nerve deficit.     Motor: No abnormal muscle tone.     Coordination: Coordination normal.     Deep Tendon Reflexes: Reflexes are normal and symmetric. Reflexes normal.  Psychiatric:        Behavior: Behavior normal.        Thought Content: Thought content normal.        Judgment: Judgment normal.     X-rays were done of both shoulders, reported separately.      Assessment & Plan:   Encounter Diagnosis  Name Primary?  . Chronic pain of both shoulders Yes   PROCEDURE NOTE:  The patient request injection, verbal consent was obtained.  The right shoulder was prepped appropriately after time out was performed.   Sterile technique was observed and injection of 1 cc of Depo-Medrol 40 mg with several cc's of plain xylocaine. Anesthesia was provided by ethyl chloride and a 20-gauge needle was used to inject the shoulder area. A posterior approach was used.  The injection was tolerated well.  A band aid dressing was applied.  The patient was advised to apply ice later today and tomorrow to the injection sight as needed.  I injected the most painful shoulder today.  Return in three weeks.  Call if any problem.  Precautions  discussed.   Electronically Signed Sanjuana Kava, MD 1/22/20202:37 PM

## 2018-07-11 ENCOUNTER — Ambulatory Visit: Payer: Medicare Other | Admitting: Orthopaedic Surgery

## 2018-07-18 ENCOUNTER — Ambulatory Visit: Payer: Medicare Other | Admitting: Orthopaedic Surgery

## 2018-07-18 ENCOUNTER — Encounter: Payer: Self-pay | Admitting: Orthopaedic Surgery

## 2018-07-18 VITALS — BP 105/70 | HR 70 | Ht 69.0 in | Wt 192.0 lb

## 2018-07-18 DIAGNOSIS — M25511 Pain in right shoulder: Secondary | ICD-10-CM | POA: Diagnosis not present

## 2018-07-18 DIAGNOSIS — G8929 Other chronic pain: Secondary | ICD-10-CM

## 2018-07-18 DIAGNOSIS — M25512 Pain in left shoulder: Secondary | ICD-10-CM | POA: Diagnosis not present

## 2018-07-18 NOTE — Progress Notes (Signed)
CC:  I am doing great  He said his right shoulder has no pain now, full ROM.  It took about three days for the injection last time to take effect but now he is doing just fine.  ROM is full of the right shoulder, NV intact.  Encounter Diagnosis  Name Primary?  . Chronic pain of both shoulders Yes   I will see him as needed.  Electronically Signed Sanjuana Kava, MD 2/19/202010:07 AM

## 2018-08-02 ENCOUNTER — Other Ambulatory Visit: Payer: Self-pay | Admitting: Family Medicine

## 2018-08-07 ENCOUNTER — Other Ambulatory Visit: Payer: Self-pay | Admitting: *Deleted

## 2018-08-07 MED ORDER — GLUCOSE BLOOD VI STRP: ORAL_STRIP | 12 refills | Status: DC

## 2018-08-16 ENCOUNTER — Other Ambulatory Visit: Payer: Self-pay | Admitting: Family Medicine

## 2018-08-16 MED ORDER — SITAGLIPTIN PHOSPHATE 100 MG PO TABS: 100.0000 mg | ORAL_TABLET | Freq: Every day | ORAL | 0 refills | Status: DC

## 2018-08-30 ENCOUNTER — Encounter: Payer: Self-pay | Admitting: Orthopaedic Surgery

## 2018-08-30 ENCOUNTER — Other Ambulatory Visit: Payer: Self-pay

## 2018-08-30 ENCOUNTER — Ambulatory Visit: Payer: Medicare Other | Admitting: Orthopaedic Surgery

## 2018-08-30 DIAGNOSIS — G8929 Other chronic pain: Secondary | ICD-10-CM

## 2018-08-30 DIAGNOSIS — M25511 Pain in right shoulder: Secondary | ICD-10-CM | POA: Diagnosis not present

## 2018-08-30 NOTE — Progress Notes (Signed)
PROCEDURE NOTE:  The patient request injection, verbal consent was obtained.  The right shoulder was prepped appropriately after time out was performed.   Sterile technique was observed and injection of 1 cc of Depo-Medrol 40 mg with several cc's of plain xylocaine. Anesthesia was provided by ethyl chloride and a 20-gauge needle was used to inject the shoulder area. A posterior approach was used.  The injection was tolerated well.  A band aid dressing was applied.  The patient was advised to apply ice later today and tomorrow to the injection sight as needed.  I will see as needed.  Electronically Signed Sanjuana Kava, MD 4/2/20209:55 AM

## 2018-09-27 ENCOUNTER — Ambulatory Visit (INDEPENDENT_AMBULATORY_CARE_PROVIDER_SITE_OTHER): Payer: Medicare Other | Admitting: Orthopaedic Surgery

## 2018-09-27 ENCOUNTER — Other Ambulatory Visit: Payer: Self-pay

## 2018-09-27 ENCOUNTER — Encounter: Payer: Self-pay | Admitting: Orthopaedic Surgery

## 2018-09-27 DIAGNOSIS — G8929 Other chronic pain: Secondary | ICD-10-CM

## 2018-09-27 DIAGNOSIS — M25511 Pain in right shoulder: Secondary | ICD-10-CM | POA: Diagnosis not present

## 2018-09-27 NOTE — Progress Notes (Signed)
Virtual Visit via Telephone Note  I connected with Caleb Butler on 09/27/18 at  9:40 AM EDT by telephone and verified that I am speaking with the correct person using two identifiers.    I discussed the limitations, risks, security and privacy concerns of performing an evaluation and management service by telephone and the availability of in person appointments. I also discussed with the patient that there may be a patient responsible charge related to this service. The patient expressed understanding and agreed to proceed.   History of Present Illness: He has tenderness in the area of the biceps rupture on the right shoulder still.  He has certain positions he puts his arm in and it hurts, mostly extension.  He has no new trauma.  He has no redness.  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  He has no numbness.  The injection last time helped.   Observations/Objective: Per above  Assessment and Plan: Encounter Diagnosis  Name Primary?  . Chronic right shoulder pain Yes     Follow Up Instructions: I will see as needed.  I discussed the assessment and treatment plan with the patient. The patient was provided an opportunity to ask questions and all were answered. The patient agreed with the plan and demonstrated an understanding of the instructions.   The patient was advised to call back or seek an in-person evaluation if the symptoms worsen or if the condition fails to improve as anticipated.  I provided 8 minutes of non-face-to-face time during this encounter.   Sanjuana Kava, MD

## 2018-11-07 ENCOUNTER — Other Ambulatory Visit: Payer: Self-pay | Admitting: Family Medicine

## 2018-11-30 ENCOUNTER — Other Ambulatory Visit: Payer: Self-pay | Admitting: Family Medicine

## 2018-12-11 ENCOUNTER — Other Ambulatory Visit: Payer: Self-pay

## 2018-12-11 ENCOUNTER — Encounter: Payer: Self-pay | Admitting: Family Medicine

## 2018-12-11 ENCOUNTER — Ambulatory Visit: Payer: Medicare Other | Admitting: Family Medicine

## 2018-12-11 VITALS — BP 100/60 | HR 70 | Temp 97.7°F | Resp 18 | Ht 69.5 in | Wt 186.0 lb

## 2018-12-11 DIAGNOSIS — Z8679 Personal history of other diseases of the circulatory system: Secondary | ICD-10-CM | POA: Diagnosis not present

## 2018-12-11 DIAGNOSIS — R6882 Decreased libido: Secondary | ICD-10-CM

## 2018-12-11 DIAGNOSIS — E7800 Pure hypercholesterolemia, unspecified: Secondary | ICD-10-CM

## 2018-12-11 DIAGNOSIS — E038 Other specified hypothyroidism: Secondary | ICD-10-CM

## 2018-12-11 DIAGNOSIS — E119 Type 2 diabetes mellitus without complications: Secondary | ICD-10-CM | POA: Diagnosis not present

## 2018-12-11 DIAGNOSIS — Z125 Encounter for screening for malignant neoplasm of prostate: Secondary | ICD-10-CM

## 2018-12-11 NOTE — Progress Notes (Signed)
Subjective:    Patient ID: Caleb Butler, male    DOB: 1945-10-06, 73 y.o.   MRN: [id number]  Medication Refill  11/30/15 He is here to establish care.  PMH is significant for DMII, hypothyroidism, HLD.  He recently started prednisone for sciatica under the care of orthopedist.  Colonosocpy was 2013.  Overdue for prostate exam, hep c test, pneumovax, tdap, and zostavax.  He has no concerns.  AT that time, my plan was: We will check HgA1c and urine microalbumin.  Check LDL and goal is less than 100.  CHeck TSh.  Screen for hep c.  Check PSA.  Colonosocopy up to date.  Receved Tdap today.  Defers pneumovax and Zostavax until next visit.  01/18/16 Patient reports progressive shortness of breath and dyspnea on exertion. He states this is been going on for last 15 years steadily worsening. He reports having had a nuclear stress test from his previous cardiologist that was reportedly normal 2 years ago. He denies any echocardiogram. He denies any orthopnea or paroxysmal nocturnal dyspnea. However he gets easily winded just walking outside and feet his horses. He does have a history of working around large quantities of his spasticity for many years growing up a lot of exposure to agent orange in [place]. Patient was recently referred to a pulmonologist who performed pulmonary function tests which are listed in [facility]. Prebronchodilator FEV1 to FVC ratio 75%. Postbronchodilator FEV1 to FVC ratio was 82% both of which are normal. However he had diminished lung volumes of 65% in both FEV1 and FVC. This suggests possible interstitial lung disease. I see no chest x-ray or CT scans performed of his lungs.  At that time, my plan was: CBC in July revealed no evidence of anemia. I will obtain previous workup performed by his cardiologist. If necessary, I'll perform an echocardiogram to evaluate for systolic or diastolic heart failure however his symptoms do not sound compatible with this. Rather I am concerned  about interstitial lung disease given his diminished lung volumes. I will obtain a CT scan of the chest to evaluate further and will likely consult pulmonology for evaluation.  08/18/16 Patient saw a pulmonologist and pulmonary workup was unremarkable aside from some scar tissue which was minimal. Was referred to cardiology. Catheterization revealed ostial stenosis in the left circumflex that was not amenable to PCI as well as a lesion in the ramus. He is being managed medically with Imdur for, Crestor, etc. He is currently taking metformin for diabetes. Patient states that his blood sugars are all less than 160. He denies any hypoglycemia. He is starting an exercise program. He denies any chest pain. Shortness of breath is stable. He denies any polyuria, polydipsia, or blurry vision. He denies any myalgias or right upper quadrant pain on Crestor. He did believe that he saw some memory loss on simvastatin. He switch to Crestor probably one week ago. At the present time he denies any side effects from Crestor.  At that time, my plan was: Blood pressure is controlled. We'll check a TSH TO ENSURE APPROPRIATE DOSE OF LEVOTHYROXINE.   Given the fact the patient  Has known coronary artery disease, I would switch from metformin to jardiance given the data on CV mortality reduction.  He would be an excellent patient for this.  Check hemoglobin A1c and if appropriate, DC metformin and replace with jardiance 25 mg poqday.    10/10/16 Ever since the patient started jardiance, the patient reports frequent polyuria. He states he is  having to go to the bathroom 5 or 6 times a day. He frequent has to wake up at night to the bathroom. However his blood sugars all ranged between 101 130. His hemoglobin A1c prior to switching from metformin to the new medication was 6.4 indicating adequate control. He is frustrated by the polyuria. He denies any dysuria, urinary incontinence, hematuria, or weak stream. About 6 weeks ago he also  developed diffuse myalgias. They involve both shoulders, both biceps, both triceps, and his hips and thighs. His been bitten by many ticks this spring and is possible he may have gotten a tickborne illness. However the muscle aches also started around the time he started Crestor.  At that time, my plan was: Discontinue jardiance and if the symptoms improve, switch the patient back to metformin. I also recommended he temporarily discontinue Crestor will check a CBC, CMP, CK, sedimentation rate, and Lyme titers. Recheck next week. If the myalgias improve, consider starting the patient back on a lower dose statin and gradually increasing.  10/14/16 Polyuria is already improving after discontinuing jardiance.  However the diffuse muscle aches are no better after stopping Crestor. It is only been 4 days. He continues complain of pain in his shoulders and in his hips as well as his arms and upper legs. EMR is certainly on the differential diagnosis. His most recent lab work as listed below. I'm reassured by his normal sedimentation rate. Lyme test is still pending.  At that time, my plan was: I still believe this is from the statins. I do not believe that he is has sufficient time for the medication to fully leave his system. I have asked for 1 week. If in 1 week, the myalgias are no better, I will consider putting the patient on prednisone for possible PMR. I believe this is less likely given his normal sedimentation rate however. His CK levels are normal ruling out myositis for the most part. I will also follow-up on the Lyme titer in one week. If negative, consider treatment for PMR if the myalgias are not improving.  10/21/16 Patient is no better. He continues to complain of severe pain in both shoulders, both triceps, both biceps, as well as both hamstrings quads and gluteus muscles. Lab work was negative for Lyme disease. He did not meet CDC criteria as he only had 2 out of the 10 proteins positive.  At that  time, my plan was: Resume Crestor. I believe this has to be PMR. Patient received 80 mg of Depo-Medrol and will start a prednisone taper pack tomorrow and then we will reassess Tuesday. If pain is improving, consult rheumatology. I will obtain x-rays of the hips, pelvis, and shoulders to rule out skeletal malignancies such as bone metastasis from an unknown primary. If the patient is no better by Tuesday, consider lab workup for multiple myeloma and a rheumatology consult for a second opinion.  12/30/16 Patient has been diagnosed with PMR and has undergone treatment for that over the last 3 months since I last saw him. However as result, he has had issues controlling his blood sugar.  He is currently on metformin 1000 mg twice daily. He brings in a month with a blood sugar readings. His fasting sugars in the morning range between 151 and 200. His two-hour postprandial sugars range between 250 and 350. There are no episodes of hypoglycemia. He scheduled to undergo angioplasty later this month.  12/11/18 Patient has not been seen in some time.  He is not  checking his blood sugars.  He denies any polyuria, polydipsia, or blurry vision.  He denies any neuropathy in his feet.  He denies any chest pain shortness of breath or dyspnea on exertion.  He is back on prednisone of the care of his rheumatologist.  They are trying to transition the patient to methotrexate.  He does report diarrhea.  He has had diarrhea off and on over the last year.  He questions if he may have celiac disease however he is also taking metformin.  He also reports fatigue and poor libido.  He has frequent erectile dysfunction as well.  No one has screened the patient for prostate cancer monitored his thyroid or checked his diabetes since I last saw him which has been more than 2 years ago.  He is long overdue for fasting lab work. Past Medical History:  Diagnosis Date   Anxiety    Arthritis    "my back is loaded w/it" (01/06/2017)   CAD  (coronary artery disease)    80% ostial lesion in L circumflex (2017)   Chronic lower back pain    GERD (gastroesophageal reflux disease)    Hiatal hernia    High cholesterol    Hypothyroidism    IBS (irritable bowel syndrome)    Numbness and tingling of right lower extremity    "since 11/30/2016" (01/06/2017)   PMR (polymyalgia rheumatica) ([facility])    Type 2 diabetes mellitus ([place])    Past Surgical History:  Procedure Laterality Date   CARDIAC CATHETERIZATION  06/2016   CORONARY ANGIOPLASTY WITH STENT PLACEMENT  01/06/2017   CORONARY STENT INTERVENTION N/A 01/06/2017   Procedure: CORONARY STENT INTERVENTION;  Surgeon: Sherren Mocha, MD;  Location: [facility];  Service: Cardiovascular;  Laterality: N/A;   FINGER SURGERY Left 1984   ring finger reattached.    FOREARM FRACTURE SURGERY Right 2004   "has 3 rods and 22 screws in it"   INGUINAL HERNIA REPAIR Left    RIGHT/LEFT HEART CATH AND CORONARY ANGIOGRAPHY N/A 07/07/2016   Procedure: Right/Left Heart Cath and Coronary Angiography;  Surgeon: Jolaine Artist, MD;  Location: [facility];  Service: Cardiovascular;  Laterality: N/A;   RIGHT/LEFT HEART CATH AND CORONARY ANGIOGRAPHY N/A 03/13/2018   Procedure: RIGHT/LEFT HEART CATH AND CORONARY ANGIOGRAPHY;  Surgeon: Jolaine Artist, MD;  Location: [facility];  Service: Cardiovascular;  Laterality: N/A;   Current Outpatient Medications on File Prior to Visit  Medication Sig Dispense Refill   ACCU-CHEK FASTCLIX LANCETS MISC USE TO CHECK BLOOD SUGAR TWICE DAILY E11.9 102 each 12   acetaminophen (TYLENOL) 500 MG tablet Take 500-1,000 mg by mouth daily as needed for moderate pain or headache.     aspirin 81 MG EC tablet Take 81 mg by mouth at bedtime.      clopidogrel (PLAVIX) 75 MG tablet TAKE 1 TABLET BY MOUTH EVERY DAY 90 tablet 3   folic acid (FOLVITE) 1 MG tablet Take 1 mg by mouth daily.     glucose blood (ACCU-CHEK AVIVA) test strip USE TO  CHECK BLOOD SUGAR TWICE DAILY E11.9 100 each 12   JANUVIA 100 MG tablet TAKE 1 TABLET BY MOUTH EVERY DAY 30 tablet 0   levothyroxine (SYNTHROID, LEVOTHROID) 100 MCG tablet TAKE 1 TABLET (100 MCG TOTAL) BY MOUTH DAILY BEFORE BREAKFAST. 90 tablet 3   metFORMIN (GLUCOPHAGE) 1000 MG tablet TAKE 1 TABLET (1,000 MG TOTAL) BY MOUTH 2 (TWO) TIMES DAILY WITH A MEAL. 180 tablet 3  methotrexate (RHEUMATREX) 2.5 MG tablet Take 10 mg by mouth every Wednesday. Caution:Chemotherapy. Protect from light.      Multiple Vitamin (MULTIVITAMIN WITH MINERALS) TABS tablet Take 1 tablet by mouth daily.     predniSONE (DELTASONE) 5 MG tablet Take 7 mg by mouth daily.      rosuvastatin (CRESTOR) 20 MG tablet Take 1 tablet (20 mg total) by mouth at bedtime. 90 tablet 3   vitamin B-12 (CYANOCOBALAMIN) 1000 MCG tablet Take 1,000 mcg by mouth daily.     No current facility-administered medications on file prior to visit.    Allergies  Allergen Reactions   Bee Venom Swelling   Ceclor [Cefaclor] Hives   Ciprofloxacin Hives   Social History   Socioeconomic History   Marital status: Married    Spouse name: [name]   Number of children: 0   Years of education: 12   Highest education level: Not on file  Occupational History    Comment: retired  Scientist, product/process development strain: Not on file   Food insecurity    Worry: Not on file    Inability: Not on Lexicographer needs    Medical: Not on file    Non-medical: Not on file  Tobacco Use   Smoking status: Never Smoker   Smokeless tobacco: Never Used  Substance and Sexual Activity   Alcohol use: Yes    Alcohol/week: 2.0 standard drinks    Types: 1 Glasses of wine, 1 Cans of beer per week    Comment: 1 beer/week   Drug use: No   Sexual activity: Not Currently  Lifestyle   Physical activity    Days per week: Not on file    Minutes per session: Not on file   Stress: Not on file  Relationships   Social connections     Talks on phone: Not on file    Gets together: Not on file    Attends religious service: Not on file    Active member of club or organization: Not on file    Attends meetings of clubs or organizations: Not on file    Relationship status: Not on file   Intimate partner violence    Fear of current or ex partner: Not on file    Emotionally abused: Not on file    Physically abused: Not on file    Forced sexual activity: Not on file  Other Topics Concern   Not on file  Social History Narrative   Retired. Lives with wife, [name].    Caffeine- coffee, 1 daily   Family History  Problem Relation Age of Onset   Diabetes Mellitus II Father    Heart failure Father    Kidney failure Father    Heart attack Mother       Review of Systems  All other systems reviewed and are negative.      Objective:   Physical Exam  Constitutional: He is oriented to person, place, and time. He appears well-developed and well-nourished. No distress.  HENT:  Head: Normocephalic and atraumatic.  Right Ear: External ear normal.  Left Ear: External ear normal.  Nose: Nose normal.  Mouth/Throat: Oropharynx is clear and moist. No oropharyngeal exudate.  Eyes: Pupils are equal, round, and reactive to light. Conjunctivae and EOM are normal. Right eye exhibits no discharge. Left eye exhibits no discharge. No scleral icterus.  Neck: Normal range of motion. Neck supple. No JVD present. No tracheal deviation present. No thyromegaly present.  Cardiovascular:  Normal rate, regular rhythm, normal heart sounds and intact distal pulses. Exam reveals no gallop and no friction rub.  No murmur heard. Pulmonary/Chest: Effort normal and breath sounds normal. No stridor. No respiratory distress. He has no wheezes. He has no rales. He exhibits no tenderness.  Abdominal: Soft. Bowel sounds are normal. He exhibits no distension and no mass. There is no abdominal tenderness. There is no rebound and no guarding.    Musculoskeletal:        General: No tenderness.  Lymphadenopathy:    He has no cervical adenopathy.  Neurological: He is alert and oriented to person, place, and time. He has normal reflexes. No cranial nerve deficit. He exhibits normal muscle tone. Coordination normal.  Skin: Skin is warm. No rash noted. He is not diaphoretic. No erythema. No pallor.  Vitals reviewed.         Assessment & Plan:   1. Controlled type 2 diabetes mellitus without complication, without long-term current use of insulin ([place]) Uncertain if his diabetes is controlled or not.  Ideally I like his hemoglobin A1c to be less than 6.5.  Check hemoglobin A1c.  If greater than 6.5, would likely recommend Trulicity versus adding Jardiance.  If less than 6.5 consider discontinuing metformin to see if this would help his diarrhea. - CBC with Differential/Platelet - COMPLETE METABOLIC PANEL WITH GFR - Lipid panel - Microalbumin, urine - Hemoglobin A1c  2. Other specified hypothyroidism I will check a TSH - TSH  3. History of ASCVD Currently on aspirin 81 mg daily.  Check fasting lipid panel.  Ideally I like his LDL cholesterol to be below 70.  His blood pressure is well controlled at 100/60  4. Pure hypercholesterolemia Please see #3  5. Prostate cancer screening Screen for prostate cancer by checking a PSA - PSA  6. Low libido Given his fatigue, declining muscle mass, and erectile dysfunction, I will check a testosterone level.  If normal, consider Viagra as needed for erectile dysfunction. - Testosterone Total,Free,Bio, Males

## 2018-12-12 ENCOUNTER — Other Ambulatory Visit: Payer: Medicare Other

## 2018-12-13 LAB — CBC WITH DIFFERENTIAL/PLATELET
Absolute Monocytes: 702 cells/uL (ref 200–950)
Basophils Absolute: 62 cells/uL (ref 0–200)
Basophils Relative: 0.8 %
Eosinophils Absolute: 257 cells/uL (ref 15–500)
Eosinophils Relative: 3.3 %
HCT: 44.3 % (ref 38.5–50.0)
Hemoglobin: 15.2 g/dL (ref 13.2–17.1)
Lymphs Abs: 1966 cells/uL (ref 850–3900)
MCH: 33.1 pg — ABNORMAL HIGH (ref 27.0–33.0)
MCHC: 34.3 g/dL (ref 32.0–36.0)
MCV: 96.5 fL (ref 80.0–100.0)
MPV: 11.8 fL (ref 7.5–12.5)
Monocytes Relative: 9 %
Neutro Abs: 4813 cells/uL (ref 1500–7800)
Neutrophils Relative %: 61.7 %
Platelets: 169 10*3/uL (ref 140–400)
RBC: 4.59 10*6/uL (ref 4.20–5.80)
RDW: 13 % (ref 11.0–15.0)
Total Lymphocyte: 25.2 %
WBC: 7.8 10*3/uL (ref 3.8–10.8)

## 2018-12-13 LAB — COMPLETE METABOLIC PANEL WITH GFR
AG Ratio: 2.3 (calc) (ref 1.0–2.5)
ALT: 20 U/L (ref 9–46)
AST: 19 U/L (ref 10–35)
Albumin: 4.3 g/dL (ref 3.6–5.1)
Alkaline phosphatase (APISO): 47 U/L (ref 35–144)
BUN: 24 mg/dL (ref 7–25)
CO2: 31 mmol/L (ref 20–32)
Calcium: 9.5 mg/dL (ref 8.6–10.3)
Chloride: 106 mmol/L (ref 98–110)
Creat: 1.13 mg/dL (ref 0.70–1.18)
GFR, Est African American: 75 mL/min/{1.73_m2} (ref >=60)
GFR, Est Non African American: 65 mL/min/{1.73_m2} (ref >=60)
Globulin: 1.9 g/dL (calc) (ref 1.9–3.7)
Glucose, Bld: 125 mg/dL — ABNORMAL HIGH (ref 65–99)
Potassium: 4.5 mmol/L (ref 3.5–5.3)
Sodium: 142 mmol/L (ref 135–146)
Total Bilirubin: 0.5 mg/dL (ref 0.2–1.2)
Total Protein: 6.2 g/dL (ref 6.1–8.1)

## 2018-12-13 LAB — LIPID PANEL
Cholesterol: 135 mg/dL (ref <200)
HDL: 58 mg/dL (ref >=40)
LDL Cholesterol (Calc): 57 mg/dL (calc)
Non-HDL Cholesterol (Calc): 77 mg/dL (calc) (ref <130)
Total CHOL/HDL Ratio: 2.3 (calc) (ref <5.0)
Triglycerides: 114 mg/dL (ref <150)

## 2018-12-13 LAB — MICROALBUMIN, URINE: Microalb, Ur: 4.4 mg/dL

## 2018-12-13 LAB — TESTOSTERONE TOTAL,FREE,BIO, MALES
Albumin: 4.3 g/dL (ref 3.6–5.1)
Sex Hormone Binding: 56 nmol/L (ref 22–77)
Testosterone, Bioavailable: 100.1 ng/dL (ref 15.0–150.0)
Testosterone, Free: 50.8 pg/mL (ref 6.0–73.0)
Testosterone: 584 ng/dL (ref 250–827)

## 2018-12-13 LAB — HEMOGLOBIN A1C
Hgb A1c MFr Bld: 7.3 % of total Hgb — ABNORMAL HIGH (ref <5.7)
Mean Plasma Glucose: 163 (calc)
eAG (mmol/L): 9 (calc)

## 2018-12-13 LAB — TSH: TSH: 2.37 mIU/L (ref 0.40–4.50)

## 2018-12-13 LAB — PSA: PSA: 1.1 ng/mL (ref <=4.0)

## 2018-12-17 ENCOUNTER — Other Ambulatory Visit: Payer: Self-pay | Admitting: Family Medicine

## 2018-12-17 MED ORDER — JARDIANCE 25 MG PO TABS: 25.0000 mg | ORAL_TABLET | Freq: Every day | ORAL | 3 refills | Status: DC

## 2019-01-29 ENCOUNTER — Other Ambulatory Visit: Payer: Self-pay

## 2019-01-29 ENCOUNTER — Ambulatory Visit (INDEPENDENT_AMBULATORY_CARE_PROVIDER_SITE_OTHER): Payer: Medicare Other | Admitting: Family Medicine

## 2019-01-29 ENCOUNTER — Encounter: Payer: Self-pay | Admitting: Family Medicine

## 2019-01-29 VITALS — BP 96/70 | HR 72 | Temp 97.7°F | Resp 18 | Ht 69.5 in | Wt 180.0 lb

## 2019-01-29 DIAGNOSIS — R350 Frequency of micturition: Secondary | ICD-10-CM

## 2019-01-29 DIAGNOSIS — N410 Acute prostatitis: Secondary | ICD-10-CM | POA: Diagnosis not present

## 2019-01-29 LAB — URINALYSIS, ROUTINE W REFLEX MICROSCOPIC
Bilirubin Urine: NEGATIVE
Hgb urine dipstick: NEGATIVE
Ketones, ur: NEGATIVE
Leukocytes,Ua: NEGATIVE
Nitrite: NEGATIVE
Protein, ur: NEGATIVE
Specific Gravity, Urine: 1.01 (ref 1.001–1.03)
pH: 5 (ref 5.0–8.0)

## 2019-01-29 MED ORDER — SULFAMETHOXAZOLE-TRIMETHOPRIM 800-160 MG PO TABS: 1.0000 | ORAL_TABLET | Freq: Two times a day (BID) | ORAL | 0 refills | Status: DC

## 2019-01-29 NOTE — Progress Notes (Signed)
Subjective:    Patient ID: Caleb Butler, male    DOB: [birth year], 73 y.o.   MRN: [id number]  Patient reports a one-month history of increased urinary frequency.  He also reports urgency as well as hesitancy.  He denies any dysuria or hematuria.  He states that he is having to wake up every hour to 2 hours to go to the bathroom and urinate.  He does report some mild hesitation particularly if he is sitting.  He does not feel like he is completely emptying his bladder.  However over the last week he has developed lower abdominal pain in the suprapubic area.  He is tender to palpation in that area.  He is also started developing dizziness and low blood pressure.  He denies any fever although he does report feeling nauseated and weak.  He is starting to feel like he is becoming "sick".  He denies any nausea or vomiting or diarrhea.  He denies any constipation.  He denies any melena or hematochezia.  Urinalysis today shows +3 glucose but is otherwise negative.  However patient is tender to palpation in the suprapubic area.  On prostate exam, the patient's prostate is swollen, tender to palpation boggy and edematous.  I am able to create significant pain with palpation of the prostate gland. Past Medical History:  Diagnosis Date  . Anxiety   . Arthritis    "my back is loaded w/it" (01/06/2017)  . CAD (coronary artery disease)    80% ostial lesion in L circumflex (2017)  . Chronic lower back pain   . GERD (gastroesophageal reflux disease)   . Hiatal hernia   . High cholesterol   . Hypothyroidism   . IBS (irritable bowel syndrome)   . Numbness and tingling of right lower extremity    "since 11/30/2016" (01/06/2017)  . PMR (polymyalgia rheumatica) ([facility])   . Type 2 diabetes mellitus ([name])    Past Surgical History:  Procedure Laterality Date  . CARDIAC CATHETERIZATION  06/2016  . CORONARY ANGIOPLASTY WITH STENT PLACEMENT  01/06/2017  . CORONARY STENT INTERVENTION N/A 01/06/2017   Procedure:  CORONARY STENT INTERVENTION;  Surgeon: Sherren Mocha, MD;  Location: [facility];  Service: Cardiovascular;  Laterality: N/A;  . FINGER SURGERY Left 1984   ring finger reattached.   . FOREARM FRACTURE SURGERY Right 2004   "has 3 rods and 22 screws in it"  . INGUINAL HERNIA REPAIR Left   . RIGHT/LEFT HEART CATH AND CORONARY ANGIOGRAPHY N/A 07/07/2016   Procedure: Right/Left Heart Cath and Coronary Angiography;  Surgeon: Jolaine Artist, MD;  Location: [facility];  Service: Cardiovascular;  Laterality: N/A;  . RIGHT/LEFT HEART CATH AND CORONARY ANGIOGRAPHY N/A 03/13/2018   Procedure: RIGHT/LEFT HEART CATH AND CORONARY ANGIOGRAPHY;  Surgeon: Jolaine Artist, MD;  Location: [facility];  Service: Cardiovascular;  Laterality: N/A;   Current Outpatient Medications on File Prior to Visit  Medication Sig Dispense Refill  . ACCU-CHEK FASTCLIX LANCETS MISC USE TO CHECK BLOOD SUGAR TWICE DAILY E11.9 102 each 12  . acetaminophen (TYLENOL) 500 MG tablet Take 500-1,000 mg by mouth daily as needed for moderate pain or headache.    Marland Kitchen aspirin 81 MG EC tablet Take 81 mg by mouth at bedtime.     . clopidogrel (PLAVIX) 75 MG tablet TAKE 1 TABLET BY MOUTH EVERY DAY 90 tablet 3  . empagliflozin (JARDIANCE) 25 MG TABS tablet Take 25 mg by mouth daily. 30 tablet 3  .  folic acid (FOLVITE) 1 MG tablet Take 1 mg by mouth daily.    Marland Kitchen glucose blood (ACCU-CHEK AVIVA) test strip USE TO CHECK BLOOD SUGAR TWICE DAILY E11.9 100 each 12  . levothyroxine (SYNTHROID, LEVOTHROID) 100 MCG tablet TAKE 1 TABLET (100 MCG TOTAL) BY MOUTH DAILY BEFORE BREAKFAST. 90 tablet 3  . metFORMIN (GLUCOPHAGE) 1000 MG tablet TAKE 1 TABLET (1,000 MG TOTAL) BY MOUTH 2 (TWO) TIMES DAILY WITH A MEAL. 180 tablet 3  . methotrexate (RHEUMATREX) 2.5 MG tablet Take 10 mg by mouth every Wednesday. Caution:Chemotherapy. Protect from light.     . Multiple Vitamin (MULTIVITAMIN WITH MINERALS) TABS tablet Take 1 tablet by mouth  daily.    . predniSONE (DELTASONE) 5 MG tablet Take 3 mg by mouth daily.     . rosuvastatin (CRESTOR) 20 MG tablet Take 1 tablet (20 mg total) by mouth at bedtime. 90 tablet 3  . vitamin B-12 (CYANOCOBALAMIN) 1000 MCG tablet Take 1,000 mcg by mouth daily.     No current facility-administered medications on file prior to visit.    Allergies  Allergen Reactions  . Bee Venom Swelling  . Ceclor [Cefaclor] Hives  . Ciprofloxacin Hives   Social History   Socioeconomic History  . Marital status: Married    Spouse name: Caleb Butler  . Number of children: 0  . Years of education: 67  . Highest education level: Not on file  Occupational History    Comment: retired  Scientific laboratory technician  . Financial resource strain: Not on file  . Food insecurity    Worry: Not on file    Inability: Not on file  . Transportation needs    Medical: Not on file    Non-medical: Not on file  Tobacco Use  . Smoking status: Never Smoker  . Smokeless tobacco: Never Used  Substance and Sexual Activity  . Alcohol use: Yes    Alcohol/week: 2.0 standard drinks    Types: 1 Glasses of wine, 1 Cans of beer per week    Comment: 1 beer/week  . Drug use: No  . Sexual activity: Not Currently  Lifestyle  . Physical activity    Days per week: Not on file    Minutes per session: Not on file  . Stress: Not on file  Relationships  . Social Herbalist on phone: Not on file    Gets together: Not on file    Attends religious service: Not on file    Active member of club or organization: Not on file    Attends meetings of clubs or organizations: Not on file    Relationship status: Not on file  . Intimate partner violence    Fear of current or ex partner: Not on file    Emotionally abused: Not on file    Physically abused: Not on file    Forced sexual activity: Not on file  Other Topics Concern  . Not on file  Social History Narrative   Retired. Lives with wife, Caleb Butler.    Caffeine- coffee, 1 daily   Family  History  Problem Relation Age of Onset  . Diabetes Mellitus II Father   . Heart failure Father   . Kidney failure Father   . Heart attack Mother       Review of Systems  All other systems reviewed and are negative.      Objective:   Physical Exam  Constitutional: He is oriented to person, place, and time. He appears well-developed and well-nourished.  No distress.  HENT:  Head: Normocephalic and atraumatic.  Right Ear: External ear normal.  Left Ear: External ear normal.  Nose: Nose normal.  Mouth/Throat: Oropharynx is clear and moist. No oropharyngeal exudate.  Eyes: Pupils are equal, round, and reactive to light. Conjunctivae and EOM are normal. Right eye exhibits no discharge. Left eye exhibits no discharge. No scleral icterus.  Neck: Normal range of motion. Neck supple. No JVD present. No tracheal deviation present. No thyromegaly present.  Cardiovascular: Normal rate, regular rhythm, normal heart sounds and intact distal pulses. Exam reveals no gallop and no friction rub.  No murmur heard. Pulmonary/Chest: Effort normal and breath sounds normal. No stridor. No respiratory distress. He has no wheezes. He has no rales. He exhibits no tenderness.  Abdominal: Soft. Bowel sounds are normal. He exhibits no distension and no mass. There is abdominal tenderness. There is no rebound and no guarding.  Genitourinary: Prostate is enlarged and tender.  Musculoskeletal:        General: No tenderness.  Lymphadenopathy:    He has no cervical adenopathy.  Neurological: He is alert and oriented to person, place, and time. He has normal reflexes. No cranial nerve deficit. He exhibits normal muscle tone. Coordination normal.  Skin: Skin is warm. No rash noted. He is not diaphoretic. No erythema. No pallor.  Vitals reviewed.         Assessment & Plan:   Frequent urination - Plan: Urinalysis, Routine w reflex microscopic  Acute prostatitis - Plan: sulfamethoxazole-trimethoprim (BACTRIM  DS) 800-160 MG tablet, CBC with Differential/Platelet, COMPLETE METABOLIC PANEL WITH GFR  I believe the patient has acute prostatitis.  I am concerned that he may be developing systemic illness.  I recommended that he start Bactrim double strength tablets twice daily for 14 days to cover prostatitis.  Meanwhile I will check a CBC as well as a CMP.  I recommended that he push fluids such as Gatorade to increase his blood pressure as well as eating a high salt diet such as canned soup.  I believe his dizziness and weakness is due to relative hypotension.  I believe that Jardiance may have contributed to development of the prostate infection.  Therefore I recommend he discontinue Jardiance due to their increased risk of urinary tract infections and replace it with Januvia 100 mg a day.  Recheck the patient in 48 hours or seek medical attention immediately if worsening.  If the pelvic pain is worsening, I would recommend a CT scan of the abdomen and pelvis.

## 2019-01-30 LAB — COMPLETE METABOLIC PANEL WITHOUT GFR
AG Ratio: 1.8 (calc) (ref 1.0–2.5)
ALT: 21 U/L (ref 9–46)
AST: 19 U/L (ref 10–35)
Albumin: 4.4 g/dL (ref 3.6–5.1)
Alkaline phosphatase (APISO): 49 U/L (ref 35–144)
BUN/Creatinine Ratio: 15 (calc) (ref 6–22)
BUN: 19 mg/dL (ref 7–25)
CO2: 27 mmol/L (ref 20–32)
Calcium: 9.9 mg/dL (ref 8.6–10.3)
Chloride: 105 mmol/L (ref 98–110)
Creat: 1.25 mg/dL — ABNORMAL HIGH (ref 0.70–1.18)
GFR, Est African American: 66 mL/min/1.73m2 (ref >=60)
GFR, Est Non African American: 57 mL/min/1.73m2 — ABNORMAL LOW (ref >=60)
Globulin: 2.4 g/dL (ref 1.9–3.7)
Glucose, Bld: 124 mg/dL — ABNORMAL HIGH (ref 65–99)
Potassium: 4.1 mmol/L (ref 3.5–5.3)
Sodium: 141 mmol/L (ref 135–146)
Total Bilirubin: 0.6 mg/dL (ref 0.2–1.2)
Total Protein: 6.8 g/dL (ref 6.1–8.1)

## 2019-01-30 LAB — CBC WITH DIFFERENTIAL/PLATELET
Absolute Monocytes: 745 cells/uL (ref 200–950)
Basophils Absolute: 51 cells/uL (ref 0–200)
Basophils Relative: 0.5 %
Eosinophils Absolute: 112 cells/uL (ref 15–500)
Eosinophils Relative: 1.1 %
HCT: 46.2 % (ref 38.5–50.0)
Hemoglobin: 15.8 g/dL (ref 13.2–17.1)
Lymphs Abs: 1183 cells/uL (ref 850–3900)
MCH: 32.4 pg (ref 27.0–33.0)
MCHC: 34.2 g/dL (ref 32.0–36.0)
MCV: 94.9 fL (ref 80.0–100.0)
MPV: 11.2 fL (ref 7.5–12.5)
Monocytes Relative: 7.3 %
Neutro Abs: 8109 cells/uL — ABNORMAL HIGH (ref 1500–7800)
Neutrophils Relative %: 79.5 %
Platelets: 219 10*3/uL (ref 140–400)
RBC: 4.87 10*6/uL (ref 4.20–5.80)
RDW: 14.5 % (ref 11.0–15.0)
Total Lymphocyte: 11.6 %
WBC: 10.2 10*3/uL (ref 3.8–10.8)

## 2019-01-31 ENCOUNTER — Other Ambulatory Visit: Payer: Self-pay

## 2019-01-31 ENCOUNTER — Ambulatory Visit (INDEPENDENT_AMBULATORY_CARE_PROVIDER_SITE_OTHER): Payer: Medicare Other | Admitting: Family Medicine

## 2019-01-31 VITALS — BP 120/72 | HR 80 | Temp 98.1°F

## 2019-01-31 DIAGNOSIS — N410 Acute prostatitis: Secondary | ICD-10-CM | POA: Diagnosis not present

## 2019-01-31 MED ORDER — SITAGLIPTIN PHOSPHATE 100 MG PO TABS: 100.0000 mg | ORAL_TABLET | Freq: Every day | ORAL | 3 refills | Status: DC

## 2019-01-31 NOTE — Progress Notes (Signed)
Subjective:    Patient ID: Caleb Butler, male    DOB: 1946-04-10, 73 y.o.   MRN: [id number] 01/29/19 Patient reports a one-month history of increased urinary frequency.  He also reports urgency as well as hesitancy.  He denies any dysuria or hematuria.  He states that he is having to wake up every hour to 2 hours to go to the bathroom and urinate.  He does report some mild hesitation particularly if he is sitting.  He does not feel like he is completely emptying his bladder.  However over the last week he has developed lower abdominal pain in the suprapubic area.  He is tender to palpation in that area.  He is also started developing dizziness and low blood pressure.  He denies any fever although he does report feeling nauseated and weak.  He is starting to feel like he is becoming "sick".  He denies any nausea or vomiting or diarrhea.  He denies any constipation.  He denies any melena or hematochezia.  Urinalysis today shows +3 glucose but is otherwise negative.  However patient is tender to palpation in the suprapubic area.  On prostate exam, the patient's prostate is swollen, tender to palpation boggy and edematous.  I am able to create significant pain with palpation of the prostate gland.  At that time, my plan was: I believe the patient has acute prostatitis.  I am concerned that he may be developing systemic illness.  I recommended that he start Bactrim double strength tablets twice daily for 14 days to cover prostatitis.  Meanwhile I will check a CBC as well as a CMP.  I recommended that he push fluids such as Gatorade to increase his blood pressure as well as eating a high salt diet such as canned soup.  I believe his dizziness and weakness is due to relative hypotension.  I believe that Jardiance may have contributed to development of the prostate infection.  Therefore I recommend he discontinue Jardiance due to their increased risk of urinary tract infections and replace it with Januvia 100  mg a day.  Recheck the patient in 48 hours or seek medical attention immediately if worsening.  If the pelvic pain is worsening, I would recommend a CT scan of the abdomen and pelvis.  01/31/19 Patient states that he felt great yesterday.  The pain had completely subsided.  He was starting to feel stronger.  He has been pushing fluid and eating chicken noodle soup.  His blood pressure has improved.  The pain returned slightly last night around 6 PM however nowhere near as severe as it was prior.  The urinary frequency is also starting to improve. Past Medical History:  Diagnosis Date   Anxiety    Arthritis    "my back is loaded w/it" (01/06/2017)   CAD (coronary artery disease)    80% ostial lesion in L circumflex (2017)   Chronic lower back pain    GERD (gastroesophageal reflux disease)    Hiatal hernia    High cholesterol    Hypothyroidism    IBS (irritable bowel syndrome)    Numbness and tingling of right lower extremity    "since 11/30/2016" (01/06/2017)   PMR (polymyalgia rheumatica) ([place])    Type 2 diabetes mellitus ([place])    Past Surgical History:  Procedure Laterality Date   CARDIAC CATHETERIZATION  06/2016   CORONARY ANGIOPLASTY WITH STENT PLACEMENT  01/06/2017   CORONARY STENT INTERVENTION N/A 01/06/2017   Procedure: CORONARY STENT INTERVENTION;  Surgeon:  Sherren Mocha, MD;  Location: [facility];  Service: Cardiovascular;  Laterality: N/A;   FINGER SURGERY Left 1984   ring finger reattached.    FOREARM FRACTURE SURGERY Right 2004   "has 3 rods and 22 screws in it"   INGUINAL HERNIA REPAIR Left    RIGHT/LEFT HEART CATH AND CORONARY ANGIOGRAPHY N/A 07/07/2016   Procedure: Right/Left Heart Cath and Coronary Angiography;  Surgeon: Jolaine Artist, MD;  Location: [facility];  Service: Cardiovascular;  Laterality: N/A;   RIGHT/LEFT HEART CATH AND CORONARY ANGIOGRAPHY N/A 03/13/2018   Procedure: RIGHT/LEFT HEART CATH AND CORONARY ANGIOGRAPHY;   Surgeon: Jolaine Artist, MD;  Location: [facility];  Service: Cardiovascular;  Laterality: N/A;   Current Outpatient Medications on File Prior to Visit  Medication Sig Dispense Refill   ACCU-CHEK FASTCLIX LANCETS MISC USE TO CHECK BLOOD SUGAR TWICE DAILY E11.9 102 each 12   acetaminophen (TYLENOL) 500 MG tablet Take 500-1,000 mg by mouth daily as needed for moderate pain or headache.     aspirin 81 MG EC tablet Take 81 mg by mouth at bedtime.      clopidogrel (PLAVIX) 75 MG tablet TAKE 1 TABLET BY MOUTH EVERY DAY 90 tablet 3   empagliflozin (JARDIANCE) 25 MG TABS tablet Take 25 mg by mouth daily. 30 tablet 3   folic acid (FOLVITE) 1 MG tablet Take 1 mg by mouth daily.     glucose blood (ACCU-CHEK AVIVA) test strip USE TO CHECK BLOOD SUGAR TWICE DAILY E11.9 100 each 12   levothyroxine (SYNTHROID, LEVOTHROID) 100 MCG tablet TAKE 1 TABLET (100 MCG TOTAL) BY MOUTH DAILY BEFORE BREAKFAST. 90 tablet 3   metFORMIN (GLUCOPHAGE) 1000 MG tablet TAKE 1 TABLET (1,000 MG TOTAL) BY MOUTH 2 (TWO) TIMES DAILY WITH A MEAL. 180 tablet 3   methotrexate (RHEUMATREX) 2.5 MG tablet Take 10 mg by mouth every Wednesday. Caution:Chemotherapy. Protect from light.      Multiple Vitamin (MULTIVITAMIN WITH MINERALS) TABS tablet Take 1 tablet by mouth daily.     predniSONE (DELTASONE) 5 MG tablet Take 3 mg by mouth daily.      rosuvastatin (CRESTOR) 20 MG tablet Take 1 tablet (20 mg total) by mouth at bedtime. 90 tablet 3   sulfamethoxazole-trimethoprim (BACTRIM DS) 800-160 MG tablet Take 1 tablet by mouth 2 (two) times daily. 28 tablet 0   vitamin B-12 (CYANOCOBALAMIN) 1000 MCG tablet Take 1,000 mcg by mouth daily.     No current facility-administered medications on file prior to visit.    Allergies  Allergen Reactions   Bee Venom Swelling   Ceclor [Cefaclor] Hives   Ciprofloxacin Hives   Social History   Socioeconomic History   Marital status: Married    Spouse name: [name]    Number of children: 0   Years of education: 12   Highest education level: Not on file  Occupational History    Comment: retired  Scientist, product/process development strain: Not on file   Food insecurity    Worry: Not on file    Inability: Not on Lexicographer needs    Medical: Not on file    Non-medical: Not on file  Tobacco Use   Smoking status: Never Smoker   Smokeless tobacco: Never Used  Substance and Sexual Activity   Alcohol use: Yes    Alcohol/week: 2.0 standard drinks    Types: 1 Glasses of wine, 1 Cans of beer per week    Comment:  1 beer/week   Drug use: No   Sexual activity: Not Currently  Lifestyle   Physical activity    Days per week: Not on file    Minutes per session: Not on file   Stress: Not on file  Relationships   Social connections    Talks on phone: Not on file    Gets together: Not on file    Attends religious service: Not on file    Active member of club or organization: Not on file    Attends meetings of clubs or organizations: Not on file    Relationship status: Not on file   Intimate partner violence    Fear of current or ex partner: Not on file    Emotionally abused: Not on file    Physically abused: Not on file    Forced sexual activity: Not on file  Other Topics Concern   Not on file  Social History Narrative   Retired. Lives with wife, [name].    Caffeine- coffee, 1 daily   Family History  Problem Relation Age of Onset   Diabetes Mellitus II Father    Heart failure Father    Kidney failure Father    Heart attack Mother       Review of Systems  All other systems reviewed and are negative.      Objective:   Physical Exam  Constitutional: He is oriented to person, place, and time. He appears well-developed and well-nourished. No distress.  HENT:  Head: Normocephalic and atraumatic.  Right Ear: External ear normal.  Left Ear: External ear normal.  Nose: Nose normal.  Mouth/Throat: Oropharynx is  clear and moist. No oropharyngeal exudate.  Eyes: Pupils are equal, round, and reactive to light. Conjunctivae and EOM are normal. Right eye exhibits no discharge. Left eye exhibits no discharge. No scleral icterus.  Neck: Normal range of motion. Neck supple. No JVD present. No tracheal deviation present. No thyromegaly present.  Cardiovascular: Normal rate, regular rhythm, normal heart sounds and intact distal pulses. Exam reveals no gallop and no friction rub.  No murmur heard. Pulmonary/Chest: Effort normal and breath sounds normal. No stridor. No respiratory distress. He has no wheezes. He has no rales. He exhibits no tenderness.  Abdominal: Soft. Bowel sounds are normal. He exhibits no distension and no mass. There is no abdominal tenderness. There is no rebound and no guarding.  Musculoskeletal:        General: No tenderness.  Lymphadenopathy:    He has no cervical adenopathy.  Neurological: He is alert and oriented to person, place, and time. He has normal reflexes. No cranial nerve deficit. He exhibits normal muscle tone. Coordination normal.  Skin: Skin is warm. No rash noted. He is not diaphoretic. No erythema. No pallor.  Vitals reviewed.         Assessment & Plan:   Acute prostatitis  Symptoms have improved.  Patient is clinically feeling better.  His tenderness has improved.  Therefore I have recommended he complete the Bactrim and reassess via telephone in 2 weeks if not completely better or sooner if getting worse or changing in any way keep.  Keep diverticulitis on the differential diagnosis.

## 2019-02-11 ENCOUNTER — Encounter: Payer: Self-pay | Admitting: Family Medicine

## 2019-02-11 ENCOUNTER — Ambulatory Visit (INDEPENDENT_AMBULATORY_CARE_PROVIDER_SITE_OTHER): Payer: Medicare Other | Admitting: Family Medicine

## 2019-02-11 ENCOUNTER — Other Ambulatory Visit: Payer: Self-pay

## 2019-02-11 VITALS — BP 110/60 | HR 80 | Temp 97.6°F | Resp 16 | Ht 69.5 in | Wt 185.0 lb

## 2019-02-11 DIAGNOSIS — K5792 Diverticulitis of intestine, part unspecified, without perforation or abscess without bleeding: Secondary | ICD-10-CM

## 2019-02-11 DIAGNOSIS — R1030 Lower abdominal pain, unspecified: Secondary | ICD-10-CM | POA: Diagnosis not present

## 2019-02-11 MED ORDER — SULFAMETHOXAZOLE-TRIMETHOPRIM 800-160 MG PO TABS: 1.0000 | ORAL_TABLET | Freq: Two times a day (BID) | ORAL | 0 refills | Status: DC

## 2019-02-11 MED ORDER — METRONIDAZOLE 500 MG PO TABS: 500.0000 mg | ORAL_TABLET | Freq: Two times a day (BID) | ORAL | 0 refills | Status: DC

## 2019-02-11 MED ORDER — MOMETASONE FUROATE 0.1 % EX OINT: TOPICAL_OINTMENT | Freq: Every day | CUTANEOUS | 0 refills | Status: AC

## 2019-02-11 NOTE — Progress Notes (Signed)
Subjective:    Patient ID: Caleb Butler, male    DOB: 1945-11-15, 74 y.o.   MRN: [id number] 01/29/19 Patient reports a one-month history of increased urinary frequency.  He also reports urgency as well as hesitancy.  He denies any dysuria or hematuria.  He states that he is having to wake up every hour to 2 hours to go to the bathroom and urinate.  He does report some mild hesitation particularly if he is sitting.  He does not feel like he is completely emptying his bladder.  However over the last week he has developed lower abdominal pain in the suprapubic area.  He is tender to palpation in that area.  He is also started developing dizziness and low blood pressure.  He denies any fever although he does report feeling nauseated and weak.  He is starting to feel like he is becoming "sick".  He denies any nausea or vomiting or diarrhea.  He denies any constipation.  He denies any melena or hematochezia.  Urinalysis today shows +3 glucose but is otherwise negative.  However patient is tender to palpation in the suprapubic area.  On prostate exam, the patient's prostate is swollen, tender to palpation boggy and edematous.  I am able to create significant pain with palpation of the prostate gland.  At that time, my plan was: I believe the patient has acute prostatitis.  I am concerned that he may be developing systemic illness.  I recommended that he start Bactrim double strength tablets twice daily for 14 days to cover prostatitis.  Meanwhile I will check a CBC as well as a CMP.  I recommended that he push fluids such as Gatorade to increase his blood pressure as well as eating a high salt diet such as canned soup.  I believe his dizziness and weakness is due to relative hypotension.  I believe that Jardiance may have contributed to development of the prostate infection.  Therefore I recommend he discontinue Jardiance due to their increased risk of urinary tract infections and replace it with Januvia 100  mg a day.  Recheck the patient in 48 hours or seek medical attention immediately if worsening.  If the pelvic pain is worsening, I would recommend a CT scan of the abdomen and pelvis.  01/31/19 Patient states that he felt great yesterday.  The pain had completely subsided.  He was starting to feel stronger.  He has been pushing fluid and eating chicken noodle soup.  His blood pressure has improved.  The pain returned slightly last night around 6 PM however nowhere near as severe as it was prior.  The urinary frequency is also starting to improve.  At that time, my plan was: Symptoms have improved.  Patient is clinically feeling better.  His tenderness has improved.  Therefore I have recommended he complete the Bactrim and reassess via telephone in 2 weeks if not completely better or sooner if getting worse or changing in any way keep.  Keep diverticulitis on the differential diagnosis.  02/11/19 Patient is back today stating that the pain never completely resolved and in fact last week intensified.  However now the pain is more in the left lower quadrant directly above the left hip joint.  He is tender to palpation in that area.  He reports diarrhea however this is a chronic problem for him.  He denies any melena or hematochezia.  He denies any fevers or chills however he is tender to palpation in the left lower quadrant.  This is despite taking Bactrim for 10 days.  The Bactrim seemed to help ameliorate the symptoms some but it did not totally resolve them.  He denies any nausea or vomiting.  He denies any dysuria.  The frequency and of urination has improved.  I repeated his prostate exam today and although still somewhat boggy, and is not as swollen or is tender to palpation as before. Past Medical History:  Diagnosis Date   Anxiety    Arthritis    "my back is loaded w/it" (01/06/2017)   CAD (coronary artery disease)    80% ostial lesion in L circumflex (2017)   Chronic lower back pain    GERD  (gastroesophageal reflux disease)    Hiatal hernia    High cholesterol    Hypothyroidism    IBS (irritable bowel syndrome)    Numbness and tingling of right lower extremity    "since 11/30/2016" (01/06/2017)   PMR (polymyalgia rheumatica) ([place])    Type 2 diabetes mellitus ([place])    Past Surgical History:  Procedure Laterality Date   CARDIAC CATHETERIZATION  06/2016   CORONARY ANGIOPLASTY WITH STENT PLACEMENT  01/06/2017   CORONARY STENT INTERVENTION N/A 01/06/2017   Procedure: CORONARY STENT INTERVENTION;  Surgeon: Sherren Mocha, MD;  Location: [facility];  Service: Cardiovascular;  Laterality: N/A;   FINGER SURGERY Left 1984   ring finger reattached.    FOREARM FRACTURE SURGERY Right 2004   "has 3 rods and 22 screws in it"   INGUINAL HERNIA REPAIR Left    RIGHT/LEFT HEART CATH AND CORONARY ANGIOGRAPHY N/A 07/07/2016   Procedure: Right/Left Heart Cath and Coronary Angiography;  Surgeon: Jolaine Artist, MD;  Location: [facility];  Service: Cardiovascular;  Laterality: N/A;   RIGHT/LEFT HEART CATH AND CORONARY ANGIOGRAPHY N/A 03/13/2018   Procedure: RIGHT/LEFT HEART CATH AND CORONARY ANGIOGRAPHY;  Surgeon: Jolaine Artist, MD;  Location: [facility];  Service: Cardiovascular;  Laterality: N/A;   Current Outpatient Medications on File Prior to Visit  Medication Sig Dispense Refill   ACCU-CHEK FASTCLIX LANCETS MISC USE TO CHECK BLOOD SUGAR TWICE DAILY E11.9 102 each 12   acetaminophen (TYLENOL) 500 MG tablet Take 500-1,000 mg by mouth daily as needed for moderate pain or headache.     aspirin 81 MG EC tablet Take 81 mg by mouth at bedtime.      clopidogrel (PLAVIX) 75 MG tablet TAKE 1 TABLET BY MOUTH EVERY DAY 90 tablet 3   folic acid (FOLVITE) 1 MG tablet Take 1 mg by mouth daily.     glucose blood (ACCU-CHEK AVIVA) test strip USE TO CHECK BLOOD SUGAR TWICE DAILY E11.9 100 each 12   levothyroxine (SYNTHROID, LEVOTHROID) 100 MCG tablet  TAKE 1 TABLET (100 MCG TOTAL) BY MOUTH DAILY BEFORE BREAKFAST. 90 tablet 3   metFORMIN (GLUCOPHAGE) 1000 MG tablet TAKE 1 TABLET (1,000 MG TOTAL) BY MOUTH 2 (TWO) TIMES DAILY WITH A MEAL. 180 tablet 3   methotrexate (RHEUMATREX) 2.5 MG tablet Take 10 mg by mouth every Wednesday. Caution:Chemotherapy. Protect from light.      Multiple Vitamin (MULTIVITAMIN WITH MINERALS) TABS tablet Take 1 tablet by mouth daily.     predniSONE (DELTASONE) 5 MG tablet Take 3 mg by mouth daily.      rosuvastatin (CRESTOR) 20 MG tablet Take 1 tablet (20 mg total) by mouth at bedtime. 90 tablet 3   sitaGLIPtin (JANUVIA) 100 MG tablet Take 1 tablet (100 mg total) by mouth daily. 90 tablet  3   sulfamethoxazole-trimethoprim (BACTRIM DS) 800-160 MG tablet Take 1 tablet by mouth 2 (two) times daily. 28 tablet 0   vitamin B-12 (CYANOCOBALAMIN) 1000 MCG tablet Take 1,000 mcg by mouth daily.     No current facility-administered medications on file prior to visit.    Allergies  Allergen Reactions   Bee Venom Swelling   Ceclor [Cefaclor] Hives   Ciprofloxacin Hives   Social History   Socioeconomic History   Marital status: Married    Spouse name: [name]   Number of children: 0   Years of education: 12   Highest education level: Not on file  Occupational History    Comment: retired  Scientist, product/process development strain: Not on file   Food insecurity    Worry: Not on file    Inability: Not on Lexicographer needs    Medical: Not on file    Non-medical: Not on file  Tobacco Use   Smoking status: Never Smoker   Smokeless tobacco: Never Used  Substance and Sexual Activity   Alcohol use: Yes    Alcohol/week: 2.0 standard drinks    Types: 1 Glasses of wine, 1 Cans of beer per week    Comment: 1 beer/week   Drug use: No   Sexual activity: Not Currently  Lifestyle   Physical activity    Days per week: Not on file    Minutes per session: Not on file   Stress: Not on file    Relationships   Social connections    Talks on phone: Not on file    Gets together: Not on file    Attends religious service: Not on file    Active member of club or organization: Not on file    Attends meetings of clubs or organizations: Not on file    Relationship status: Not on file   Intimate partner violence    Fear of current or ex partner: Not on file    Emotionally abused: Not on file    Physically abused: Not on file    Forced sexual activity: Not on file  Other Topics Concern   Not on file  Social History Narrative   Retired. Lives with wife, [name].    Caffeine- coffee, 1 daily   Family History  Problem Relation Age of Onset   Diabetes Mellitus II Father    Heart failure Father    Kidney failure Father    Heart attack Mother       Review of Systems  All other systems reviewed and are negative.      Objective:   Physical Exam  Constitutional: He is oriented to person, place, and time. He appears well-developed and well-nourished. No distress.  HENT:  Head: Normocephalic and atraumatic.  Right Ear: External ear normal.  Left Ear: External ear normal.  Nose: Nose normal.  Mouth/Throat: Oropharynx is clear and moist. No oropharyngeal exudate.  Eyes: Pupils are equal, round, and reactive to light. Conjunctivae and EOM are normal. Right eye exhibits no discharge. Left eye exhibits no discharge. No scleral icterus.  Neck: Normal range of motion. Neck supple. No JVD present. No tracheal deviation present. No thyromegaly present.  Cardiovascular: Normal rate, regular rhythm, normal heart sounds and intact distal pulses. Exam reveals no gallop and no friction rub.  No murmur heard. Pulmonary/Chest: Effort normal and breath sounds normal. No stridor. No respiratory distress. He has no wheezes. He has no rales. He exhibits no tenderness.  Abdominal: Soft.  Bowel sounds are normal. He exhibits no distension and no mass. There is abdominal tenderness in the left  lower quadrant. There is no rebound and no guarding.    Genitourinary: Prostate is enlarged. Prostate is not tender.  Musculoskeletal:        General: No tenderness.  Lymphadenopathy:    He has no cervical adenopathy.  Neurological: He is alert and oriented to person, place, and time. He has normal reflexes. No cranial nerve deficit. He exhibits normal muscle tone. Coordination normal.  Skin: Skin is warm. No rash noted. He is not diaphoretic. No erythema. No pallor.  Vitals reviewed.         Assessment & Plan:  Diverticulitis - Plan: CT Abdomen Pelvis W Contrast  Lower abdominal pain - Plan: CT Abdomen Pelvis W Contrast  Based on the location of the pain, I suspect diverticulitis.  The patient has a history of hives and allergy to Cipro.  He also has a history of hives and allergy to cephalosporins.  Therefore I would not use Cipro or Augmentin.  Instead I will use Bactrim double strength tablets twice daily for 10 days coupled with Flagyl 500 mg tablets twice daily for 10 days to cover both gram negatives and anaerobic enteric flora.  I will also schedule a CT scan of the abdomen pelvis to evaluate further given the atypical presentation

## 2019-02-22 ENCOUNTER — Encounter (HOSPITAL_COMMUNITY): Payer: Self-pay

## 2019-02-22 ENCOUNTER — Other Ambulatory Visit: Payer: Self-pay

## 2019-02-22 ENCOUNTER — Ambulatory Visit (HOSPITAL_COMMUNITY)
Admission: RE | Admit: 2019-02-22 | Discharge: 2019-02-22 | Disposition: A | Discharge status: Home / Self Care | Payer: Medicare Other | Source: Ambulatory Visit | Attending: Family Medicine | Admitting: Family Medicine

## 2019-02-22 DIAGNOSIS — K5792 Diverticulitis of intestine, part unspecified, without perforation or abscess without bleeding: Secondary | ICD-10-CM

## 2019-02-22 DIAGNOSIS — R1030 Lower abdominal pain, unspecified: Secondary | ICD-10-CM

## 2019-02-26 ENCOUNTER — Other Ambulatory Visit (HOSPITAL_COMMUNITY): Payer: Self-pay

## 2019-02-26 DIAGNOSIS — I2510 Atherosclerotic heart disease of native coronary artery without angina pectoris: Secondary | ICD-10-CM

## 2019-02-26 MED ORDER — CLOPIDOGREL BISULFATE 75 MG PO TABS: 75.0000 mg | ORAL_TABLET | Freq: Every day | ORAL | 3 refills | Status: DC

## 2019-02-28 ENCOUNTER — Other Ambulatory Visit: Payer: Self-pay | Admitting: Family Medicine

## 2019-03-11 ENCOUNTER — Other Ambulatory Visit: Payer: Self-pay

## 2019-03-11 ENCOUNTER — Ambulatory Visit (HOSPITAL_COMMUNITY)
Admission: RE | Admit: 2019-03-11 | Discharge: 2019-03-11 | Disposition: A | Discharge status: Home / Self Care | Payer: Medicare Other | Source: Ambulatory Visit | Attending: Family Medicine | Admitting: Family Medicine

## 2019-03-11 DIAGNOSIS — R1030 Lower abdominal pain, unspecified: Secondary | ICD-10-CM | POA: Insufficient documentation

## 2019-03-11 DIAGNOSIS — K5792 Diverticulitis of intestine, part unspecified, without perforation or abscess without bleeding: Secondary | ICD-10-CM | POA: Insufficient documentation

## 2019-03-11 MED ORDER — IOHEXOL 300 MG/ML  SOLN
100.0000 mL | Freq: Once | INTRAMUSCULAR | Status: AC | PRN
  Administered 2019-03-11: 11:00:00 100 mL via INTRAVENOUS

## 2019-03-14 ENCOUNTER — Other Ambulatory Visit: Payer: Self-pay

## 2019-03-14 MED ORDER — LACTOBACILLUS CASEI-FOLIC ACID 60-1.25 MG PO CAPS: 60.0000 mg | ORAL_CAPSULE | Freq: Every day | ORAL | 2 refills | Status: DC

## 2019-03-14 MED ORDER — AMOXICILLIN-POT CLAVULANATE 875-125 MG PO TABS: 1.0000 | ORAL_TABLET | Freq: Two times a day (BID) | ORAL | 0 refills | Status: DC

## 2019-03-20 ENCOUNTER — Other Ambulatory Visit: Payer: Self-pay | Admitting: Family Medicine

## 2019-07-25 ENCOUNTER — Other Ambulatory Visit (HOSPITAL_COMMUNITY): Payer: Self-pay

## 2019-07-25 MED ORDER — ROSUVASTATIN CALCIUM 20 MG PO TABS: 20.0000 mg | ORAL_TABLET | Freq: Every day | ORAL | 0 refills | Status: DC

## 2019-08-05 ENCOUNTER — Telehealth: Payer: Self-pay | Admitting: *Deleted

## 2019-08-05 ENCOUNTER — Other Ambulatory Visit (HOSPITAL_COMMUNITY): Payer: Self-pay | Admitting: Cardiology

## 2019-08-05 MED ORDER — ROSUVASTATIN CALCIUM 20 MG PO TABS: 20.0000 mg | ORAL_TABLET | Freq: Every day | ORAL | 0 refills | Status: DC

## 2019-08-05 NOTE — Telephone Encounter (Signed)
Received call from patient.   Reports that he was seen by GI and was advised to stop Metformin. Reports that ABD issues have subsided since stopping Metformin. Reports that FSBS in AM fasting range from 120- 140's. States that he is still taking Januvia.   Inquired as to if another medication should be added.   MD please advise.

## 2019-08-06 MED ORDER — PIOGLITAZONE HCL 30 MG PO TABS: 30.0000 mg | ORAL_TABLET | Freq: Every day | ORAL | 3 refills | Status: DC

## 2019-08-06 NOTE — Telephone Encounter (Signed)
Prescription sent to pharmacy.   Appointment scheduled.

## 2019-08-06 NOTE — Telephone Encounter (Signed)
Overdue for labs and HgA1c (OV).  I would replace metformin with actos 30 mg a day

## 2019-08-12 ENCOUNTER — Other Ambulatory Visit: Payer: Self-pay

## 2019-08-12 ENCOUNTER — Ambulatory Visit (INDEPENDENT_AMBULATORY_CARE_PROVIDER_SITE_OTHER): Payer: Medicare Other | Admitting: Family Medicine

## 2019-08-12 ENCOUNTER — Encounter: Payer: Self-pay | Admitting: Family Medicine

## 2019-08-12 VITALS — BP 110/70 | HR 78 | Temp 97.2°F | Resp 14 | Ht 69.5 in | Wt 187.0 lb

## 2019-08-12 DIAGNOSIS — E038 Other specified hypothyroidism: Secondary | ICD-10-CM | POA: Diagnosis not present

## 2019-08-12 DIAGNOSIS — E119 Type 2 diabetes mellitus without complications: Secondary | ICD-10-CM

## 2019-08-12 DIAGNOSIS — E7800 Pure hypercholesterolemia, unspecified: Secondary | ICD-10-CM | POA: Diagnosis not present

## 2019-08-12 DIAGNOSIS — Z8679 Personal history of other diseases of the circulatory system: Secondary | ICD-10-CM

## 2019-08-12 DIAGNOSIS — R413 Other amnesia: Secondary | ICD-10-CM

## 2019-08-12 NOTE — Progress Notes (Signed)
Subjective:    Patient ID: Caleb Butler, male    DOB: 04-12-1946, 74 y.o.   MRN: [id number]  Recently had to stop Metformin due to diarrhea testicle issues.  Last hemoglobin A1c was 7.3 in the summer around July.  He is here today for recheck.  He has a history of coronary artery disease.  He also has a history of hypothyroidism.  He is currently seeing a rheumatologist for PMR and is on methotrexate.  Recently he has been seeing a gastroenterologist due to diarrhea and stomach discomfort.  Since stopping Metformin the symptoms have gone away however he now requires something to manage his blood sugars.  He has not checking his blood sugars and therefore I am not certain of how well controlled they are in the first place.  He cannot tolerate Jardiance due to urinary tract infections.  He cannot tolerate Metformin due to GI issues.  He is already on Januvia.  Therefore I gave him Actos.  However I will discuss this further with him even his heart history and its potential side effects.  He also reports some mild memory impairment.  For instance he recently had no recollection of seeing a movie with his wife.  Otherwise he has some minor issues as far as forgetting items.  He denies forgetting names or getting lost or forgetting to pay bills.  He does report some issues with his balance however he is not falling.  Diabetic foot exam was performed today and is normal.  He denies any angina.  He denies any shortness of breath or dyspnea on exertion that is new.  He denies any neuropathy in his feet Past Medical History:  Diagnosis Date  . Anxiety   . Arthritis    "my back is loaded w/it" (01/06/2017)  . CAD (coronary artery disease)    80% ostial lesion in L circumflex (2017)  . Chronic lower back pain   . GERD (gastroesophageal reflux disease)   . Hiatal hernia   . High cholesterol   . Hypothyroidism   . IBS (irritable bowel syndrome)   . Numbness and tingling of right lower extremity     "since 11/30/2016" (01/06/2017)  . PMR (polymyalgia rheumatica) (HCC)   . Type 2 diabetes mellitus ([place])    Past Surgical History:  Procedure Laterality Date  . CARDIAC CATHETERIZATION  06/2016  . CORONARY ANGIOPLASTY WITH STENT PLACEMENT  01/06/2017  . CORONARY STENT INTERVENTION N/A 01/06/2017   Procedure: CORONARY STENT INTERVENTION;  Surgeon: Sherren Mocha, MD;  Location: [facility];  Service: Cardiovascular;  Laterality: N/A;  . FINGER SURGERY Left 1984   ring finger reattached.   . FOREARM FRACTURE SURGERY Right 2004   "has 3 rods and 22 screws in it"  . INGUINAL HERNIA REPAIR Left   . RIGHT/LEFT HEART CATH AND CORONARY ANGIOGRAPHY N/A 07/07/2016   Procedure: Right/Left Heart Cath and Coronary Angiography;  Surgeon: Jolaine Artist, MD;  Location: [facility];  Service: Cardiovascular;  Laterality: N/A;  . RIGHT/LEFT HEART CATH AND CORONARY ANGIOGRAPHY N/A 03/13/2018   Procedure: RIGHT/LEFT HEART CATH AND CORONARY ANGIOGRAPHY;  Surgeon: Jolaine Artist, MD;  Location: [facility];  Service: Cardiovascular;  Laterality: N/A;   Current Outpatient Medications on File Prior to Visit  Medication Sig Dispense Refill  . ACCU-CHEK FASTCLIX LANCETS MISC USE TO CHECK BLOOD SUGAR TWICE DAILY E11.9 102 each 12  . acetaminophen (TYLENOL) 500 MG tablet Take 500-1,000 mg by mouth daily as  needed for moderate pain or headache.    Marland Kitchen aspirin 81 MG EC tablet Take 81 mg by mouth at bedtime.     . clopidogrel (PLAVIX) 75 MG tablet Take 1 tablet (75 mg total) by mouth daily. 90 tablet 3  . folic acid (FOLVITE) 1 MG tablet Take 1 mg by mouth daily.    Marland Kitchen glucose blood (ACCU-CHEK AVIVA) test strip USE TO CHECK BLOOD SUGAR TWICE DAILY E11.9 100 each 12  . Lactobacillus Casei-Folic Acid 123XX123 MG CAPS Take 60 mg by mouth daily. 30 capsule 2  . levothyroxine (SYNTHROID) 100 MCG tablet TAKE 1 TABLET (100 MCG TOTAL) BY MOUTH DAILY BEFORE BREAKFAST. 90 tablet 3  . methotrexate  (RHEUMATREX) 2.5 MG tablet Take 17.5 mg by mouth every Wednesday. Caution:Chemotherapy. Protect from light.     . mometasone (ELOCON) 0.1 % ointment Apply topically daily. 45 g 0  . Multiple Vitamin (MULTIVITAMIN WITH MINERALS) TABS tablet Take 1 tablet by mouth daily.    . rosuvastatin (CRESTOR) 20 MG tablet Take 1 tablet (20 mg total) by mouth at bedtime. Need f/u appointment ASAP for future refills 30 tablet 0  . sitaGLIPtin (JANUVIA) 100 MG tablet Take 1 tablet (100 mg total) by mouth daily. 90 tablet 3  . vitamin B-12 (CYANOCOBALAMIN) 1000 MCG tablet Take 1,000 mcg by mouth daily.    . pioglitazone (ACTOS) 30 MG tablet Take 1 tablet (30 mg total) by mouth daily. (Patient not taking: Reported on 08/12/2019) 30 tablet 3   No current facility-administered medications on file prior to visit.   Allergies  Allergen Reactions  . Bee Venom Swelling  . Ceclor [Cefaclor] Hives  . Ciprofloxacin Hives   Social History   Socioeconomic History  . Marital status: Married    Spouse name: [name]  . Number of children: 0  . Years of education: 65  . Highest education level: Not on file  Occupational History    Comment: retired  Tobacco Use  . Smoking status: Never Smoker  . Smokeless tobacco: Never Used  Substance and Sexual Activity  . Alcohol use: Yes    Alcohol/week: 2.0 standard drinks    Types: 1 Glasses of wine, 1 Cans of beer per week    Comment: 1 beer/week  . Drug use: No  . Sexual activity: Not Currently  Other Topics Concern  . Not on file  Social History Narrative   Retired. Lives with wife, [name].    Caffeine- coffee, 1 daily   Social Determinants of Health   Financial Resource Strain:   . Difficulty of Paying Living Expenses:   Food Insecurity:   . Worried About Charity fundraiser in the Last Year:   . Arboriculturist in the Last Year:   Transportation Needs:   . Film/video editor (Medical):   Marland Kitchen Lack of Transportation (Non-Medical):   Physical Activity:     . Days of Exercise per Week:   . Minutes of Exercise per Session:   Stress:   . Feeling of Stress :   Social Connections:   . Frequency of Communication with Friends and Family:   . Frequency of Social Gatherings with Friends and Family:   . Attends Religious Services:   . Active Member of Clubs or Organizations:   . Attends Archivist Meetings:   Marland Kitchen Marital Status:   Intimate Partner Violence:   . Fear of Current or Ex-Partner:   . Emotionally Abused:   Marland Kitchen Physically Abused:   .  Sexually Abused:    Family History  Problem Relation Age of Onset  . Diabetes Mellitus II Father   . Heart failure Father   . Kidney failure Father   . Heart attack Mother       Review of Systems  All other systems reviewed and are negative.      Objective:   Physical Exam  Constitutional: He is oriented to person, place, and time. He appears well-developed and well-nourished. No distress.  HENT:  Head: Normocephalic and atraumatic.  Right Ear: External ear normal.  Left Ear: External ear normal.  Nose: Nose normal.  Mouth/Throat: Oropharynx is clear and moist. No oropharyngeal exudate.  Eyes: Pupils are equal, round, and reactive to light. Conjunctivae and EOM are normal. Right eye exhibits no discharge. Left eye exhibits no discharge. No scleral icterus.  Neck: No JVD present. No tracheal deviation present. No thyromegaly present.  Cardiovascular: Normal rate, regular rhythm, normal heart sounds and intact distal pulses. Exam reveals no gallop and no friction rub.  No murmur heard. Pulmonary/Chest: Effort normal and breath sounds normal. No stridor. No respiratory distress. He has no wheezes. He has no rales. He exhibits no tenderness.  Abdominal: Soft. Bowel sounds are normal. He exhibits no distension and no mass. There is no abdominal tenderness. There is no rebound and no guarding.  Musculoskeletal:        General: No tenderness.     Cervical back: Normal range of motion and  neck supple.  Lymphadenopathy:    He has no cervical adenopathy.  Neurological: He is alert and oriented to person, place, and time. He has normal reflexes. No cranial nerve deficit. He exhibits normal muscle tone. Coordination normal.  Skin: Skin is warm. No rash noted. He is not diaphoretic. No erythema. No pallor.  Vitals reviewed.         Assessment & Plan:  Controlled type 2 diabetes mellitus without complication, without long-term current use of insulin (HCC) - Plan: Hemoglobin A1c, CBC with Differential/Platelet, COMPLETE METABOLIC PANEL WITH GFR, Lipid panel, Microalbumin, urine  Other specified hypothyroidism  History of ASCVD  Pure hypercholesterolemia  Memory loss - Plan: Vitamin B12  Patient has some mild memory loss however at the present time I do not feel this is significant enough to warrant a diagnosis of dementia.  That being said I will check a vitamin B12 level given the fact he was on Metformin to see if there may be an element of B12 deficiency causing the memory issues and also potentially the balance issues.  Blood pressure today is well controlled at 110/70.  I will check a fasting lipid panel.  Goal LDL cholesterol is less than 70 given his history of ASCVD.  Check hemoglobin A1c.  If 7 or less, he could replace Metformin with either Actos or Trulicity.  If greater than 7 not only we need to replace Metformin with Actos but we would need to add additional medication to bring his A1c under control.  Updated his shot records.  He had his flu shot as well as Pneumovax 23 through his rheumatologist

## 2019-08-13 LAB — CBC WITH DIFFERENTIAL/PLATELET
Absolute Monocytes: 597 cells/uL (ref 200–950)
Basophils Absolute: 70 cells/uL (ref 0–200)
Basophils Relative: 1.2 %
Eosinophils Absolute: 261 cells/uL (ref 15–500)
Eosinophils Relative: 4.5 %
HCT: 42.6 % (ref 38.5–50.0)
Hemoglobin: 14.3 g/dL (ref 13.2–17.1)
Lymphs Abs: 1085 cells/uL (ref 850–3900)
MCH: 33.1 pg — ABNORMAL HIGH (ref 27.0–33.0)
MCHC: 33.6 g/dL (ref 32.0–36.0)
MCV: 98.6 fL (ref 80.0–100.0)
MPV: 11.6 fL (ref 7.5–12.5)
Monocytes Relative: 10.3 %
Neutro Abs: 3787 cells/uL (ref 1500–7800)
Neutrophils Relative %: 65.3 %
Platelets: 236 10*3/uL (ref 140–400)
RBC: 4.32 10*6/uL (ref 4.20–5.80)
RDW: 13 % (ref 11.0–15.0)
Total Lymphocyte: 18.7 %
WBC: 5.8 10*3/uL (ref 3.8–10.8)

## 2019-08-13 LAB — LIPID PANEL
Cholesterol: 107 mg/dL (ref <200)
HDL: 40 mg/dL (ref >=40)
LDL Cholesterol (Calc): 48 mg/dL (calc)
Non-HDL Cholesterol (Calc): 67 mg/dL (calc) (ref <130)
Total CHOL/HDL Ratio: 2.7 (calc) (ref <5.0)
Triglycerides: 103 mg/dL (ref <150)

## 2019-08-13 LAB — COMPLETE METABOLIC PANEL WITH GFR
AG Ratio: 2.2 (calc) (ref 1.0–2.5)
ALT: 20 U/L (ref 9–46)
AST: 23 U/L (ref 10–35)
Albumin: 4.2 g/dL (ref 3.6–5.1)
Alkaline phosphatase (APISO): 52 U/L (ref 35–144)
BUN: 18 mg/dL (ref 7–25)
CO2: 27 mmol/L (ref 20–32)
Calcium: 9.4 mg/dL (ref 8.6–10.3)
Chloride: 107 mmol/L (ref 98–110)
Creat: 1.09 mg/dL (ref 0.70–1.18)
GFR, Est African American: 78 mL/min/{1.73_m2} (ref >=60)
GFR, Est Non African American: 67 mL/min/{1.73_m2} (ref >=60)
Globulin: 1.9 g/dL (calc) (ref 1.9–3.7)
Glucose, Bld: 149 mg/dL — ABNORMAL HIGH (ref 65–99)
Potassium: 4.3 mmol/L (ref 3.5–5.3)
Sodium: 140 mmol/L (ref 135–146)
Total Bilirubin: 0.7 mg/dL (ref 0.2–1.2)
Total Protein: 6.1 g/dL (ref 6.1–8.1)

## 2019-08-13 LAB — HEMOGLOBIN A1C
Hgb A1c MFr Bld: 6.6 % of total Hgb — ABNORMAL HIGH (ref <5.7)
Mean Plasma Glucose: 143 (calc)
eAG (mmol/L): 7.9 (calc)

## 2019-08-13 LAB — VITAMIN B12: Vitamin B-12: 798 pg/mL (ref 200–1100)

## 2019-08-13 LAB — MICROALBUMIN, URINE: Microalb, Ur: 5.8 mg/dL

## 2019-08-14 ENCOUNTER — Encounter: Payer: Self-pay | Admitting: Family Medicine

## 2019-08-18 ENCOUNTER — Other Ambulatory Visit (HOSPITAL_COMMUNITY): Payer: Self-pay | Admitting: Internal Medicine

## 2019-08-20 ENCOUNTER — Other Ambulatory Visit (HOSPITAL_COMMUNITY): Payer: Self-pay | Admitting: *Deleted

## 2019-08-20 MED ORDER — ROSUVASTATIN CALCIUM 20 MG PO TABS: 20.0000 mg | ORAL_TABLET | Freq: Every day | ORAL | 0 refills | Status: DC

## 2019-09-12 ENCOUNTER — Other Ambulatory Visit (HOSPITAL_COMMUNITY): Payer: Self-pay | Admitting: *Deleted

## 2019-09-12 MED ORDER — ROSUVASTATIN CALCIUM 20 MG PO TABS: 20.0000 mg | ORAL_TABLET | Freq: Every day | ORAL | 3 refills | Status: DC

## 2019-09-17 ENCOUNTER — Other Ambulatory Visit: Payer: Self-pay | Admitting: Family Medicine

## 2019-10-29 ENCOUNTER — Other Ambulatory Visit: Payer: Self-pay | Admitting: Family Medicine

## 2019-12-16 ENCOUNTER — Other Ambulatory Visit: Payer: Self-pay | Admitting: Family Medicine

## 2020-01-05 ENCOUNTER — Other Ambulatory Visit (HOSPITAL_COMMUNITY): Payer: Self-pay | Admitting: Internal Medicine

## 2020-01-13 ENCOUNTER — Other Ambulatory Visit: Payer: Self-pay

## 2020-01-13 ENCOUNTER — Ambulatory Visit
Admission: EM | Admit: 2020-01-13 | Discharge: 2020-01-13 | Disposition: A | Discharge status: Home / Self Care | Payer: Medicare Other | Attending: Emergency Medicine | Admitting: Emergency Medicine

## 2020-01-13 ENCOUNTER — Ambulatory Visit (INDEPENDENT_AMBULATORY_CARE_PROVIDER_SITE_OTHER): Payer: Medicare Other

## 2020-01-13 DIAGNOSIS — M25531 Pain in right wrist: Secondary | ICD-10-CM

## 2020-01-13 DIAGNOSIS — M25431 Effusion, right wrist: Secondary | ICD-10-CM | POA: Diagnosis not present

## 2020-01-13 DIAGNOSIS — S6991XA Unspecified injury of right wrist, hand and finger(s), initial encounter: Secondary | ICD-10-CM

## 2020-01-13 NOTE — ED Triage Notes (Signed)
Pt injured right wrist after arm closed in gate, swelling noted

## 2020-01-13 NOTE — ED Provider Notes (Signed)
[facility]   [id number] 01/13/20 Arrival Time: 1157  EV:[id number] PAIN  SUBJECTIVE: History from: patient. Justin Meisenheimer is a 74 y.o. male complains of RT wrist pain and injury that occurred today.  Closed wrist in gate.  Localizes the pain to the outside of wrist.  Describes the pain as constant and achy in character.  Has tried icing with relief.  Symptoms are made worse with ROM.  Reports previous wrist injury.  Complains of associated swelling and bruising.  Denies fever, chills, erythema, weakness, numbness and tingling.  ROS: As per HPI.  All other pertinent ROS negative.     Past Medical History:  Diagnosis Date   Anxiety    Arthritis    "my back is loaded w/it" (01/06/2017)   CAD (coronary artery disease)    80% ostial lesion in L circumflex (2017)   Chronic lower back pain    GERD (gastroesophageal reflux disease)    Hiatal hernia    High cholesterol    Hypothyroidism    IBS (irritable bowel syndrome)    Numbness and tingling of right lower extremity    "since 11/30/2016" (01/06/2017)   PMR (polymyalgia rheumatica) ([place])    Type 2 diabetes mellitus ([place])    Past Surgical History:  Procedure Laterality Date   CARDIAC CATHETERIZATION  06/2016   CORONARY ANGIOPLASTY WITH STENT PLACEMENT  01/06/2017   CORONARY STENT INTERVENTION N/A 01/06/2017   Procedure: CORONARY STENT INTERVENTION;  Surgeon: Sherren Mocha, MD;  Location: [facility];  Service: Cardiovascular;  Laterality: N/A;   FINGER SURGERY Left 1984   ring finger reattached.    FOREARM FRACTURE SURGERY Right 2004   "has 3 rods and 22 screws in it"   INGUINAL HERNIA REPAIR Left    RIGHT/LEFT HEART CATH AND CORONARY ANGIOGRAPHY N/A 07/07/2016   Procedure: Right/Left Heart Cath and Coronary Angiography;  Surgeon: Jolaine Artist, MD;  Location: [facility];  Service: Cardiovascular;  Laterality: N/A;   RIGHT/LEFT HEART CATH AND CORONARY ANGIOGRAPHY N/A 03/13/2018    Procedure: RIGHT/LEFT HEART CATH AND CORONARY ANGIOGRAPHY;  Surgeon: Jolaine Artist, MD;  Location: [facility];  Service: Cardiovascular;  Laterality: N/A;   Allergies  Allergen Reactions   Bee Venom Swelling   Ceclor [Cefaclor] Hives   Ciprofloxacin Hives   Jardiance [Empagliflozin]    Metformin And Related Diarrhea    upsesomach   No current facility-administered medications on file prior to encounter.   Current Outpatient Medications on File Prior to Encounter  Medication Sig Dispense Refill   ACCU-CHEK AVIVA PLUS test strip USE TO CHECK BLOOD SUGAR TWICE DAILY E11.9 100 strip 12   ACCU-CHEK FASTCLIX LANCETS MISC USE TO CHECK BLOOD SUGAR TWICE DAILY E11.9 102 each 12   acetaminophen (TYLENOL) 500 MG tablet Take 500-1,000 mg by mouth daily as needed for moderate pain or headache.     aspirin 81 MG EC tablet Take 81 mg by mouth at bedtime.      clopidogrel (PLAVIX) 75 MG tablet Take 1 tablet (75 mg total) by mouth daily. 90 tablet 3   folic acid (FOLVITE) 1 MG tablet Take 1 mg by mouth daily.     JANUVIA 100 MG tablet TAKE 1 TABLET BY MOUTH EVERY DAY 90 tablet 3   Lactobacillus Casei-Folic Acid 93-8.18 MG CAPS Take 60 mg by mouth daily. 30 capsule 2   levothyroxine (SYNTHROID) 100 MCG tablet TAKE 1 TABLET (100 MCG TOTAL) BY MOUTH DAILY BEFORE BREAKFAST. 90 tablet  3   methotrexate (RHEUMATREX) 2.5 MG tablet Take 17.5 mg by mouth every Wednesday. Caution:Chemotherapy. Protect from light.      mometasone (ELOCON) 0.1 % ointment Apply topically daily. 45 g 0   Multiple Vitamin (MULTIVITAMIN WITH MINERALS) TABS tablet Take 1 tablet by mouth daily.     pioglitazone (ACTOS) 30 MG tablet TAKE 1 TABLET BY MOUTH EVERY DAY 90 tablet 1   rosuvastatin (CRESTOR) 20 MG tablet Take 1 tablet (20 mg total) by mouth daily. Last refill without office visit [phone] 90 tablet 0   vitamin B-12 (CYANOCOBALAMIN) 1000 MCG tablet Take 1,000 mcg by mouth daily.     Social  History   Socioeconomic History   Marital status: Married    Spouse name: [name]   Number of children: 0   Years of education: 12   Highest education level: Not on file  Occupational History    Comment: retired  Tobacco Use   Smoking status: Never Smoker   Smokeless tobacco: Never Used  Scientific laboratory technician Use: Never used  Substance and Sexual Activity   Alcohol use: Yes    Alcohol/week: 2.0 standard drinks    Types: 1 Glasses of wine, 1 Cans of beer per week    Comment: 1 beer/week   Drug use: No   Sexual activity: Not Currently  Other Topics Concern   Not on file  Social History Narrative   Retired. Lives with wife, [name].    Caffeine- coffee, 1 daily   Social Determinants of Health   Financial Resource Strain:    Difficulty of Paying Living Expenses:   Food Insecurity:    Worried About Charity fundraiser in the Last Year:    Arboriculturist in the Last Year:   Transportation Needs:    Film/video editor (Medical):    Lack of Transportation (Non-Medical):   Physical Activity:    Days of Exercise per Week:    Minutes of Exercise per Session:   Stress:    Feeling of Stress :   Social Connections:    Frequency of Communication with Friends and Family:    Frequency of Social Gatherings with Friends and Family:    Attends Religious Services:    Active Member of Clubs or Organizations:    Attends Music therapist:    Marital Status:   Intimate Partner Violence:    Fear of Current or Ex-Partner:    Emotionally Abused:    Physically Abused:    Sexually Abused:    Family History  Problem Relation Age of Onset   Diabetes Mellitus II Father    Heart failure Father    Kidney failure Father    Heart attack Mother     OBJECTIVE:  Vitals:   01/13/20 1237  BP: 120/82  Pulse: 71  Resp: 17  Temp: (!) 97.3 F (36.3 C)  TempSrc: Oral  SpO2: 98%    General appearance: ALERT; in no acute distress.  Head:  NCAT Lungs: Normal respiratory effort CV: Radial pulse 2+ Musculoskeletal: RT wrist Inspection: mild swelling and light ecchymosis RT anterior lateral wrist Palpation: TTP over RT anterior lateral wrist ROM: FROM active and passive Strength: 5/5 grip strength Skin: warm and dry Neurologic: Ambulates without difficulty; Sensation intact about the upper extremities Psychological: alert and cooperative; normal mood and affect  DIAGNOSTIC STUDIES:  DG Wrist Complete Right  Result Date: 01/13/2020 CLINICAL DATA:  Right wrist pain and swelling after a horse pinned it against  a gate. EXAM: RIGHT WRIST - COMPLETE 3+ VIEW COMPARISON:  None. FINDINGS: No acute fracture or dislocation. Partially visualized ORIF hardware of the radius and ulna diaphyses. Joint spaces are preserved. Bone mineralization is normal. Soft tissues are unremarkable. IMPRESSION: 1.  No acute osseous abnormality. Electronically Signed   By: Titus Dubin M.D.   On: 01/13/2020 12:53     X-rays negative for bony abnormalities including acute fracture, or dislocation.    I have reviewed the x-rays myself and the radiologist interpretation. I am in agreement with the radiologist interpretation.     ASSESSMENT & PLAN:  1. Right wrist pain   2. Injury of right wrist, initial encounter    X-rays negative for fracture or dislocation Continue conservative management of rest, ice, and gentle stretches Ace applied Alternate ibuprofen and tylenol as needed for pain and inflammation Follow up with PCP if symptoms persist Return or go to the ER if you have any new or worsening symptoms (fever, chills, chest pain, redness, swelling, deformity, etc...)    Reviewed expectations re: course of current medical issues. Questions answered. Outlined signs and symptoms indicating need for more acute intervention. Patient verbalized understanding. After Visit Summary given.    Lestine Box, PA-C 01/13/20 1323

## 2020-01-13 NOTE — Discharge Instructions (Signed)
X-rays negative for fracture or dislocation Continue conservative management of rest, ice, and gentle stretches Ace applied Alternate ibuprofen and tylenol as needed for pain and inflammation Follow up with PCP if symptoms persist Return or go to the ER if you have any new or worsening symptoms (fever, chills, chest pain, redness, swelling, deformity, etc...)

## 2020-01-14 ENCOUNTER — Ambulatory Visit: Payer: Medicare Other | Admitting: Nurse Practitioner

## 2020-02-11 ENCOUNTER — Other Ambulatory Visit: Payer: Self-pay

## 2020-02-11 DIAGNOSIS — E119 Type 2 diabetes mellitus without complications: Secondary | ICD-10-CM

## 2020-02-11 MED ORDER — BLOOD GLUCOSE METER KIT: PACK | 0 refills | Status: AC

## 2020-02-12 ENCOUNTER — Other Ambulatory Visit (HOSPITAL_COMMUNITY): Payer: Self-pay | Admitting: Internal Medicine

## 2020-02-12 DIAGNOSIS — I2510 Atherosclerotic heart disease of native coronary artery without angina pectoris: Secondary | ICD-10-CM

## 2020-02-20 ENCOUNTER — Other Ambulatory Visit: Payer: Self-pay

## 2020-02-20 ENCOUNTER — Ambulatory Visit (INDEPENDENT_AMBULATORY_CARE_PROVIDER_SITE_OTHER): Payer: Medicare Other | Admitting: Family Medicine

## 2020-02-20 VITALS — BP 100/70 | HR 75 | Temp 97.9°F | Ht 69.0 in | Wt 188.0 lb

## 2020-02-20 DIAGNOSIS — W1840XA Slipping, tripping and stumbling without falling, unspecified, initial encounter: Secondary | ICD-10-CM

## 2020-02-20 DIAGNOSIS — E119 Type 2 diabetes mellitus without complications: Secondary | ICD-10-CM

## 2020-02-20 DIAGNOSIS — R413 Other amnesia: Secondary | ICD-10-CM | POA: Diagnosis not present

## 2020-02-20 DIAGNOSIS — R270 Ataxia, unspecified: Secondary | ICD-10-CM

## 2020-02-20 DIAGNOSIS — R296 Repeated falls: Secondary | ICD-10-CM

## 2020-02-20 DIAGNOSIS — Z23 Encounter for immunization: Secondary | ICD-10-CM

## 2020-02-20 MED ORDER — TIZANIDINE HCL 4 MG PO TABS
4.0000 mg | ORAL_TABLET | Freq: Four times a day (QID) | ORAL | 0 refills | Status: DC | PRN
Start: 2020-02-20 — End: 2020-05-14

## 2020-02-20 NOTE — Progress Notes (Signed)
Subjective:    Patient ID: Christan Defranco, male    DOB: 08-06-1945, 74 y.o.   MRN: [id number]  Patient is a very pleasant 74 year old Caucasian male who presents today with several concerns.  First, 1 week ago, he injured his back lifting a 50 pound bag of animal feed.  The pain is located on the right lower back to the left of the lumbar spine.  He has palpable muscle spasms in that area.  It hurts to lie down flat on his back.  It hurts to stand up.  He denies any weakness in his legs.  He has a negative straight leg raise bilaterally.  He denies any numbness or tingling in his legs other than some chronic numbness on the anterior surface of his left leg that has been present ever since a remote injury 30 years ago.  He has normal reflexes in his right leg.  He has normal strength in his right leg.  He has no spinous process tenderness to palpation on exam.  Symptoms and presentation sound classic for a pulled muscle in his lower back.  Second, the patient reports worsening memory loss.  He states that he is becoming more easily confused.  He states that he is forgetting the names of people he is known for more than 50 years.  The names will come to him later but he is definitely noticing that he is having a harder time naming people and objects that he should remember.  He denies getting lost driving.  He denies losing money or forgetting to pay bills.  He does forget conversations that he has had recently.  He denies any headaches or seizures or blurry vision.  However he states that he is tripping and falling almost on a daily basis.  He states that he is tripping 1-2 times a day walking around his house.  He will catch himself more easily losing his balance.  He will stagger into doorways.  He denies any weakness in his arms or legs.  There is no hyperreflexia on examination.  He denies any muscle weakness.  He has normal grip strength and normal arm strength bilaterally.  Cerebellar testing is  performed today.  He is able to do heel-to-toe walk up and down the hallway but he does sway considerably while performing it however he does not fall.  He is able to perform Romberg testing without losing his balance.  Finger-to-nose testing is performed normally.  There is no evidence of a neurologic deficit on his neurologic exam. Past Medical History:  Diagnosis Date  . Anxiety   . Arthritis    "my back is loaded w/it" (01/06/2017)  . CAD (coronary artery disease)    80% ostial lesion in L circumflex (2017)  . Chronic lower back pain   . GERD (gastroesophageal reflux disease)   . Hiatal hernia   . High cholesterol   . Hypothyroidism   . IBS (irritable bowel syndrome)   . Numbness and tingling of right lower extremity    "since 11/30/2016" (01/06/2017)  . PMR (polymyalgia rheumatica) (HCC)   . Type 2 diabetes mellitus ([place])    Past Surgical History:  Procedure Laterality Date  . CARDIAC CATHETERIZATION  06/2016  . CORONARY ANGIOPLASTY WITH STENT PLACEMENT  01/06/2017  . CORONARY STENT INTERVENTION N/A 01/06/2017   Procedure: CORONARY STENT INTERVENTION;  Surgeon: Sherren Mocha, MD;  Location: [facility];  Service: Cardiovascular;  Laterality: N/A;  . FINGER SURGERY Left 1984  ring finger reattached.   . FOREARM FRACTURE SURGERY Right 2004   "has 3 rods and 22 screws in it"  . INGUINAL HERNIA REPAIR Left   . RIGHT/LEFT HEART CATH AND CORONARY ANGIOGRAPHY N/A 07/07/2016   Procedure: Right/Left Heart Cath and Coronary Angiography;  Surgeon: Jolaine Artist, MD;  Location: [facility];  Service: Cardiovascular;  Laterality: N/A;  . RIGHT/LEFT HEART CATH AND CORONARY ANGIOGRAPHY N/A 03/13/2018   Procedure: RIGHT/LEFT HEART CATH AND CORONARY ANGIOGRAPHY;  Surgeon: Jolaine Artist, MD;  Location: [facility];  Service: Cardiovascular;  Laterality: N/A;   Current Outpatient Medications on File Prior to Visit  Medication Sig Dispense Refill  . ACCU-CHEK AVIVA  PLUS test strip USE TO CHECK BLOOD SUGAR TWICE DAILY E11.9 100 strip 12  . ACCU-CHEK FASTCLIX LANCETS MISC USE TO CHECK BLOOD SUGAR TWICE DAILY E11.9 102 each 12  . acetaminophen (TYLENOL) 500 MG tablet Take 500-1,000 mg by mouth daily as needed for moderate pain or headache.    Marland Kitchen aspirin 81 MG EC tablet Take 81 mg by mouth at bedtime.     . blood glucose meter kit and supplies Dispense based on patient and insurance preference. Use up to four times daily as directed. (FOR ICD-10 E10.9, E11.9). 1 each 0  . clopidogrel (PLAVIX) 75 MG tablet Take 1 tablet (75 mg total) by mouth daily. Needs appt for further refills 90 tablet 0  . folic acid (FOLVITE) 1 MG tablet Take 1 mg by mouth daily.    Marland Kitchen JANUVIA 100 MG tablet TAKE 1 TABLET BY MOUTH EVERY DAY 90 tablet 3  . Lactobacillus Casei-Folic Acid 87-8.67 MG CAPS Take 60 mg by mouth daily. 30 capsule 2  . levothyroxine (SYNTHROID) 100 MCG tablet TAKE 1 TABLET (100 MCG TOTAL) BY MOUTH DAILY BEFORE BREAKFAST. 90 tablet 3  . methotrexate (RHEUMATREX) 2.5 MG tablet Take 17.5 mg by mouth every Wednesday. Caution:Chemotherapy. Protect from light.     . mometasone (ELOCON) 0.1 % ointment Apply topically daily. 45 g 0  . Multiple Vitamin (MULTIVITAMIN WITH MINERALS) TABS tablet Take 1 tablet by mouth daily.    . pioglitazone (ACTOS) 30 MG tablet TAKE 1 TABLET BY MOUTH EVERY DAY 90 tablet 1  . rosuvastatin (CRESTOR) 20 MG tablet Take 1 tablet (20 mg total) by mouth daily. Last refill without office visit [phone] 90 tablet 0  . vitamin B-12 (CYANOCOBALAMIN) 1000 MCG tablet Take 1,000 mcg by mouth daily.     No current facility-administered medications on file prior to visit.   Allergies  Allergen Reactions  . Bee Venom Swelling  . Ceclor [Cefaclor] Hives  . Ciprofloxacin Hives  . Jardiance [Empagliflozin]   . Metformin And Related Diarrhea    upsesomach   Social History   Socioeconomic History  . Marital status: Married    Spouse name: [name]  .  Number of children: 0  . Years of education: 9  . Highest education level: Not on file  Occupational History    Comment: retired  Tobacco Use  . Smoking status: Never Smoker  . Smokeless tobacco: Never Used  Vaping Use  . Vaping Use: Never used  Substance and Sexual Activity  . Alcohol use: Yes    Alcohol/week: 2.0 standard drinks    Types: 1 Glasses of wine, 1 Cans of beer per week    Comment: 1 beer/week  . Drug use: No  . Sexual activity: Not Currently  Other Topics Concern  . Not on file  Social History Narrative   Retired. Lives with wife, [name].    Caffeine- coffee, 1 daily   Social Determinants of Health   Financial Resource Strain:   . Difficulty of Paying Living Expenses: Not on file  Food Insecurity:   . Worried About Charity fundraiser in the Last Year: Not on file  . Ran Out of Food in the Last Year: Not on file  Transportation Needs:   . Lack of Transportation (Medical): Not on file  . Lack of Transportation (Non-Medical): Not on file  Physical Activity:   . Days of Exercise per Week: Not on file  . Minutes of Exercise per Session: Not on file  Stress:   . Feeling of Stress : Not on file  Social Connections:   . Frequency of Communication with Friends and Family: Not on file  . Frequency of Social Gatherings with Friends and Family: Not on file  . Attends Religious Services: Not on file  . Active Member of Clubs or Organizations: Not on file  . Attends Archivist Meetings: Not on file  . Marital Status: Not on file  Intimate Partner Violence:   . Fear of Current or Ex-Partner: Not on file  . Emotionally Abused: Not on file  . Physically Abused: Not on file  . Sexually Abused: Not on file   Family History  Problem Relation Age of Onset  . Diabetes Mellitus II Father   . Heart failure Father   . Kidney failure Father   . Heart attack Mother       Review of Systems  All other systems reviewed and are negative.      Objective:     Physical Exam Vitals reviewed.  Constitutional:      General: He is not in acute distress.    Appearance: He is well-developed. He is not diaphoretic.  HENT:     Head: Normocephalic and atraumatic.     Right Ear: External ear normal.     Left Ear: External ear normal.     Nose: Nose normal.     Mouth/Throat:     Pharynx: No oropharyngeal exudate.  Eyes:     General: No visual field deficit or scleral icterus.       Right eye: No discharge.        Left eye: No discharge.     Conjunctiva/sclera: Conjunctivae normal.     Pupils: Pupils are equal, round, and reactive to light.  Neck:     Thyroid: No thyromegaly.     Vascular: No JVD.     Trachea: No tracheal deviation.  Cardiovascular:     Rate and Rhythm: Normal rate and regular rhythm.     Heart sounds: Normal heart sounds. No murmur heard.  No friction rub. No gallop.   Pulmonary:     Effort: Pulmonary effort is normal. No respiratory distress.     Breath sounds: Normal breath sounds. No stridor. No wheezing or rales.  Chest:     Chest wall: No tenderness.  Abdominal:     General: Bowel sounds are normal. There is no distension.     Palpations: Abdomen is soft. There is no mass.     Tenderness: There is no abdominal tenderness. There is no guarding or rebound.  Musculoskeletal:        General: No tenderness.     Cervical back: Normal range of motion and neck supple.  Lymphadenopathy:     Cervical: No cervical adenopathy.  Skin:  General: Skin is warm.     Coloration: Skin is not pale.     Findings: No erythema or rash.  Neurological:     General: No focal deficit present.     Mental Status: He is alert and oriented to person, place, and time.     Cranial Nerves: Cranial nerves are intact. No cranial nerve deficit, dysarthria or facial asymmetry.     Sensory: Sensation is intact. No sensory deficit.     Motor: Motor function is intact. No weakness, tremor, atrophy, abnormal muscle tone, seizure activity or pronator  drift.     Coordination: Coordination is intact. Romberg sign negative. Coordination normal. Finger-Nose-Finger Test and Heel to [facility] Test normal.     Gait: Gait is intact. Gait and tandem walk normal.     Deep Tendon Reflexes: Reflexes are normal and symmetric.           Assessment & Plan:  Memory loss - Plan: MR Brain W Wo Contrast  Controlled type 2 diabetes mellitus without complication, without long-term current use of insulin (HCC) - Plan: Hemoglobin A1c, CBC with Differential/Platelet, COMPLETE METABOLIC PANEL WITH GFR  Falling episodes - Plan: MR Brain W Wo Contrast  Tripping over things - Plan: MR Brain W Wo Contrast  Ataxia - Plan: MR Brain W Wo Contrast  I believe the injury to his lower back is most likely a pulled muscle.  I recommended tincture of time and this should improve gradually over the next week.  He can use tizanidine 4 mg every 6 hours as needed for muscle spasms.  I cautioned the patient about dizziness and sedation on the medication.  I am very concerned about his worsening memory loss and now his ataxia.  He staggering into doorways.  He is falling and tripping frequently.  His neurologic exam today is relatively normal.  However given the concerning features of his history I have recommended an MRI of the brain.  If the situation worsens may need to consult neurology for EMG and nerve conduction studies of the extremities.  Meanwhile assess management of his diabetes by checking a CBC, CMP, and hemoglobin A1c

## 2020-02-21 LAB — COMPLETE METABOLIC PANEL WITH GFR
AG Ratio: 2.1 (calc) (ref 1.0–2.5)
ALT: 22 U/L (ref 9–46)
AST: 20 U/L (ref 10–35)
Albumin: 4.5 g/dL (ref 3.6–5.1)
Alkaline phosphatase (APISO): 60 U/L (ref 35–144)
BUN/Creatinine Ratio: 17 (calc) (ref 6–22)
BUN: 21 mg/dL (ref 7–25)
CO2: 25 mmol/L (ref 20–32)
Calcium: 9.5 mg/dL (ref 8.6–10.3)
Chloride: 104 mmol/L (ref 98–110)
Creat: 1.21 mg/dL — ABNORMAL HIGH (ref 0.70–1.18)
GFR, Est African American: 68 mL/min/{1.73_m2} (ref >=60)
GFR, Est Non African American: 59 mL/min/{1.73_m2} — ABNORMAL LOW (ref >=60)
Globulin: 2.1 g/dL (calc) (ref 1.9–3.7)
Glucose, Bld: 185 mg/dL — ABNORMAL HIGH (ref 65–99)
Potassium: 4.2 mmol/L (ref 3.5–5.3)
Sodium: 139 mmol/L (ref 135–146)
Total Bilirubin: 0.4 mg/dL (ref 0.2–1.2)
Total Protein: 6.6 g/dL (ref 6.1–8.1)

## 2020-02-21 LAB — CBC WITH DIFFERENTIAL/PLATELET
Absolute Monocytes: 791 cells/uL (ref 200–950)
Basophils Absolute: 77 cells/uL (ref 0–200)
Basophils Relative: 0.9 %
Eosinophils Absolute: 224 cells/uL (ref 15–500)
Eosinophils Relative: 2.6 %
HCT: 44.6 % (ref 38.5–50.0)
Hemoglobin: 14.9 g/dL (ref 13.2–17.1)
Lymphs Abs: 1359 cells/uL (ref 850–3900)
MCH: 33.4 pg — ABNORMAL HIGH (ref 27.0–33.0)
MCHC: 33.4 g/dL (ref 32.0–36.0)
MCV: 100 fL (ref 80.0–100.0)
MPV: 11.7 fL (ref 7.5–12.5)
Monocytes Relative: 9.2 %
Neutro Abs: 6149 cells/uL (ref 1500–7800)
Neutrophils Relative %: 71.5 %
Platelets: 203 10*3/uL (ref 140–400)
RBC: 4.46 10*6/uL (ref 4.20–5.80)
RDW: 13.7 % (ref 11.0–15.0)
Total Lymphocyte: 15.8 %
WBC: 8.6 10*3/uL (ref 3.8–10.8)

## 2020-02-21 LAB — HEMOGLOBIN A1C
Hgb A1c MFr Bld: 7.5 % of total Hgb — ABNORMAL HIGH (ref <5.7)
Mean Plasma Glucose: 169 (calc)
eAG (mmol/L): 9.3 (calc)

## 2020-02-22 ENCOUNTER — Other Ambulatory Visit: Payer: Self-pay | Admitting: Family Medicine

## 2020-03-03 ENCOUNTER — Ambulatory Visit (INDEPENDENT_AMBULATORY_CARE_PROVIDER_SITE_OTHER): Payer: Medicare Other | Admitting: Clinical

## 2020-03-03 ENCOUNTER — Other Ambulatory Visit: Payer: Self-pay

## 2020-03-03 DIAGNOSIS — F4310 Post-traumatic stress disorder, unspecified: Secondary | ICD-10-CM | POA: Diagnosis not present
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
## 2020-03-04 ENCOUNTER — Encounter: Payer: Self-pay | Admitting: *Deleted

## 2020-03-04 MED ORDER — TRULICITY 1.5 MG/0.5ML ~~LOC~~ SOAJ: 1.5000 mg | SUBCUTANEOUS | 11 refills | Status: DC

## 2020-03-04 MED ORDER — TRULICITY 0.75 MG/0.5ML ~~LOC~~ SOAJ: 0.7500 mg | SUBCUTANEOUS | 0 refills | Status: DC

## 2020-03-05 ENCOUNTER — Ambulatory Visit (HOSPITAL_COMMUNITY): Payer: Medicare Other

## 2020-03-06 ENCOUNTER — Other Ambulatory Visit: Payer: Self-pay

## 2020-03-06 ENCOUNTER — Ambulatory Visit (HOSPITAL_COMMUNITY)
Admission: RE | Admit: 2020-03-06 | Discharge: 2020-03-06 | Disposition: A | Discharge status: Home / Self Care | Payer: Medicare Other | Source: Ambulatory Visit | Attending: Family Medicine | Admitting: Family Medicine

## 2020-03-06 DIAGNOSIS — Y939 Activity, unspecified: Secondary | ICD-10-CM | POA: Insufficient documentation

## 2020-03-06 DIAGNOSIS — R296 Repeated falls: Secondary | ICD-10-CM | POA: Diagnosis not present

## 2020-03-06 DIAGNOSIS — Y929 Unspecified place or not applicable: Secondary | ICD-10-CM | POA: Insufficient documentation

## 2020-03-06 DIAGNOSIS — R413 Other amnesia: Secondary | ICD-10-CM

## 2020-03-06 DIAGNOSIS — W1840XA Slipping, tripping and stumbling without falling, unspecified, initial encounter: Secondary | ICD-10-CM | POA: Insufficient documentation

## 2020-03-06 DIAGNOSIS — R270 Ataxia, unspecified: Secondary | ICD-10-CM | POA: Diagnosis not present

## 2020-03-06 MED ORDER — GADOBUTROL 1 MMOL/ML IV SOLN
8.0000 mL | Freq: Once | INTRAVENOUS | Status: AC | PRN
  Administered 2020-03-06: 8 mL via INTRAVENOUS

## 2020-03-13 ENCOUNTER — Ambulatory Visit: Payer: Medicare Other | Admitting: Family Medicine

## 2020-03-13 ENCOUNTER — Other Ambulatory Visit: Payer: Self-pay | Admitting: Family Medicine

## 2020-03-13 ENCOUNTER — Other Ambulatory Visit: Payer: Self-pay

## 2020-03-13 VITALS — BP 118/72 | HR 70 | Temp 97.9°F | Ht 69.5 in | Wt 188.8 lb

## 2020-03-13 DIAGNOSIS — R0600 Dyspnea, unspecified: Secondary | ICD-10-CM | POA: Diagnosis not present

## 2020-03-13 DIAGNOSIS — I2510 Atherosclerotic heart disease of native coronary artery without angina pectoris: Secondary | ICD-10-CM | POA: Diagnosis not present

## 2020-03-13 DIAGNOSIS — R0609 Other forms of dyspnea: Secondary | ICD-10-CM

## 2020-03-13 MED ORDER — ISOSORBIDE MONONITRATE ER 30 MG PO TB24: 30.0000 mg | ORAL_TABLET | Freq: Every day | ORAL | 6 refills | Status: DC

## 2020-03-13 NOTE — Progress Notes (Signed)
Subjective:    Patient ID: Caleb Butler, male    DOB: 1945/12/16, 74 y.o.   MRN: [id number] 02/20/20 Patient is a very pleasant 74 year old Caucasian male who presents today with several concerns.  First, 1 week ago, he injured his back lifting a 50 pound bag of animal feed.  The pain is located on the right lower back to the le +ft of the lumbar spine.  He has palpable muscle spasms in that area.  It hurts to lie down flat on his back.  It hurts to stand up.  He denies any weakness in his legs.  He has a negative straight leg raise bilaterally.  He denies any numbness or tingling in his legs other than some chronic numbness on the anterior surface of his left leg that has been present ever since a remote injury 30 years ago.  He has normal reflexes in his right leg.  He has normal strength in his right leg.  He has no spinous process tenderness to palpation on exam.  Symptoms and presentation sound classic for a pulled muscle in his lower back.  Second, the patient reports worsening memory loss.  He states that he is becoming more easily confused.  He states that he is forgetting the names of people he is known for more than 50 years.  The names will come to him later but he is definitely noticing that he is having a harder time naming people and objects that he should remember.  He denies getting lost driving.  He denies losing money or forgetting to pay bills.  He does forget conversations that he has had recently.  He denies any headaches or seizures or blurry vision.  However he states that he is tripping and falling almost on a daily basis.  He states that he is tripping 1-2 times a day walking around his house.  He will catch himself more easily losing his balance.  He will stagger into doorways.  He denies any weakness in his arms or legs.  There is no hyperreflexia on examination.  He denies any muscle weakness.  He has normal grip strength and normal arm strength bilaterally.  Cerebellar  testing is performed today.  He is able to do heel-to-toe walk up and down the hallway but he does sway considerably while performing it however he does not fall.  He is able to perform Romberg testing without losing his balance.  Finger-to-nose testing is performed normally.  There is no evidence of a neurologic deficit on his neurologic exam.  At that time, my plan was: I believe the injury to his lower back is most likely a pulled muscle.  I recommended tincture of time and this should improve gradually over the next week.  He can use tizanidine 4 mg every 6 hours as needed for muscle spasms.  I cautioned the patient about dizziness and sedation on the medication.  I am very concerned about his worsening memory loss and now his ataxia.  He staggering into doorways.  He is falling and tripping frequently.  His neurologic exam today is relatively normal.  However given the concerning features of his history I have recommended an MRI of the brain.  If the situation worsens may need to consult neurology for EMG and nerve conduction studies of the extremities.  Meanwhile assess management of his diabetes by checking a CBC, CMP, and hemoglobin A1c  03/13/20 MRI- No acute or reversible finding. Essentially normal study for age. Minimal small  vessel change of the hemispheric white matter, less than often seen at 74.  Appointment was originally made to discuss his MRI findings and his memory loss.  I initially was planning to discuss potentially starting him on Aricept.  However he reports that recently he had substernal chest pain.  It occurred while driving.  It was located directly below his sternum.  It lasted several minutes and spontaneously resolved.  Along with this chest pain, the patient had shortness of breath.  He states that he is been short of breath for years however his shortness of breath has been worsening recently.  He states that he has no energy.  He has very little stamina.  He reports  progressive dyspnea on exertion.  He has had only one episode of the chest pain and has not had any more in the last week.  Of note, the patient had a catheterization in February 2018.  He has a history of a stent in his left circumflex.  On the catheterization in 2018 the stent in the left circumflex was patent.  However the patient was found to have a 40% blockage in his right coronary artery, 20% blockage in his LAD, a 90% blockage in the ramus, and an 80% blockage in a diagonal lesion.  I question if the 80 or 90% blockage may have worsened.  He is not on any long-acting nitroglycerin or Ranexa. Past Medical History:  Diagnosis Date  . Anxiety   . Arthritis    "my back is loaded w/it" (01/06/2017)  . CAD (coronary artery disease)    80% ostial lesion in L circumflex (2017)  . Chronic lower back pain   . GERD (gastroesophageal reflux disease)   . Hiatal hernia   . High cholesterol   . Hypothyroidism   . IBS (irritable bowel syndrome)   . Numbness and tingling of right lower extremity    "since 11/30/2016" (01/06/2017)  . PMR (polymyalgia rheumatica) (HCC)   . Type 2 diabetes mellitus ([place])    Past Surgical History:  Procedure Laterality Date  . CARDIAC CATHETERIZATION  06/2016  . CORONARY ANGIOPLASTY WITH STENT PLACEMENT  01/06/2017  . CORONARY STENT INTERVENTION N/A 01/06/2017   Procedure: CORONARY STENT INTERVENTION;  Surgeon: Sherren Mocha, MD;  Location: [facility];  Service: Cardiovascular;  Laterality: N/A;  . FINGER SURGERY Left 1984   ring finger reattached.   . FOREARM FRACTURE SURGERY Right 2004   "has 3 rods and 22 screws in it"  . INGUINAL HERNIA REPAIR Left   . RIGHT/LEFT HEART CATH AND CORONARY ANGIOGRAPHY N/A 07/07/2016   Procedure: Right/Left Heart Cath and Coronary Angiography;  Surgeon: Jolaine Artist, MD;  Location: [facility];  Service: Cardiovascular;  Laterality: N/A;  . RIGHT/LEFT HEART CATH AND CORONARY ANGIOGRAPHY N/A 03/13/2018   Procedure:  RIGHT/LEFT HEART CATH AND CORONARY ANGIOGRAPHY;  Surgeon: Jolaine Artist, MD;  Location: [facility];  Service: Cardiovascular;  Laterality: N/A;   Current Outpatient Medications on File Prior to Visit  Medication Sig Dispense Refill  . ACCU-CHEK AVIVA PLUS test strip USE TO CHECK BLOOD SUGAR TWICE DAILY E11.9 100 strip 12  . Accu-Chek FastClix Lancets MISC USE TO CHECK BLOOD SUGAR TWICE DAILY E11.9 102 each 12  . acetaminophen (TYLENOL) 500 MG tablet Take 500-1,000 mg by mouth daily as needed for moderate pain or headache.    Marland Kitchen aspirin 81 MG EC tablet Take 81 mg by mouth at bedtime.     Marland Kitchen  blood glucose meter kit and supplies Dispense based on patient and insurance preference. Use up to four times daily as directed. (FOR ICD-10 E10.9, E11.9). 1 each 0  . clopidogrel (PLAVIX) 75 MG tablet Take 1 tablet (75 mg total) by mouth daily. Needs appt for further refills 90 tablet 0  . Dulaglutide (TRULICITY) 5.46 EV/0.3JK SOPN Inject 0.75 mg into the skin once a week for 4 doses. x4 weeks, then D/C 2 mL 0  . [START ON 04/01/2020] Dulaglutide (TRULICITY) 1.5 KX/3.8HW SOPN Inject 1.5 mg into the skin once a week. 4 mL 11  . folic acid (FOLVITE) 1 MG tablet Take 1 mg by mouth daily.    . Lactobacillus Casei-Folic Acid 29-9.37 MG CAPS Take 60 mg by mouth daily. 30 capsule 2  . levothyroxine (SYNTHROID) 100 MCG tablet TAKE 1 TABLET (100 MCG TOTAL) BY MOUTH DAILY BEFORE BREAKFAST. 90 tablet 3  . methotrexate (RHEUMATREX) 2.5 MG tablet Take 17.5 mg by mouth every Wednesday. Caution:Chemotherapy. Protect from light.     . mometasone (ELOCON) 0.1 % ointment Apply topically daily. 45 g 0  . Multiple Vitamin (MULTIVITAMIN WITH MINERALS) TABS tablet Take 1 tablet by mouth daily.    . pioglitazone (ACTOS) 30 MG tablet TAKE 1 TABLET BY MOUTH EVERY DAY 90 tablet 1  . rosuvastatin (CRESTOR) 20 MG tablet Take 1 tablet (20 mg total) by mouth daily. Last refill without office visit [phone] 90 tablet 0  .  tiZANidine (ZANAFLEX) 4 MG tablet Take 1 tablet (4 mg total) by mouth every 6 (six) hours as needed for muscle spasms. 30 tablet 0  . vitamin B-12 (CYANOCOBALAMIN) 1000 MCG tablet Take 1,000 mcg by mouth daily.     No current facility-administered medications on file prior to visit.   Allergies  Allergen Reactions  . Bee Venom Swelling  . Ceclor [Cefaclor] Hives  . Ciprofloxacin Hives  . Jardiance [Empagliflozin]   . Metformin And Related Diarrhea    upsesomach   Social History   Socioeconomic History  . Marital status: Married    Spouse name: [name]  . Number of children: 0  . Years of education: 55  . Highest education level: Not on file  Occupational History    Comment: retired  Tobacco Use  . Smoking status: Never Smoker  . Smokeless tobacco: Never Used  Vaping Use  . Vaping Use: Never used  Substance and Sexual Activity  . Alcohol use: Yes    Alcohol/week: 2.0 standard drinks    Types: 1 Glasses of wine, 1 Cans of beer per week    Comment: 1 beer/week  . Drug use: No  . Sexual activity: Not Currently  Other Topics Concern  . Not on file  Social History Narrative   Retired. Lives with wife, [name].    Caffeine- coffee, 1 daily   Social Determinants of Health   Financial Resource Strain:   . Difficulty of Paying Living Expenses: Not on file  Food Insecurity:   . Worried About Charity fundraiser in the Last Year: Not on file  . Ran Out of Food in the Last Year: Not on file  Transportation Needs:   . Lack of Transportation (Medical): Not on file  . Lack of Transportation (Non-Medical): Not on file  Physical Activity:   . Days of Exercise per Week: Not on file  . Minutes of Exercise per Session: Not on file  Stress:   . Feeling of Stress : Not on file  Social Connections:   .  Frequency of Communication with Friends and Family: Not on file  . Frequency of Social Gatherings with Friends and Family: Not on file  . Attends Religious Services: Not on file  .  Active Member of Clubs or Organizations: Not on file  . Attends Archivist Meetings: Not on file  . Marital Status: Not on file  Intimate Partner Violence:   . Fear of Current or Ex-Partner: Not on file  . Emotionally Abused: Not on file  . Physically Abused: Not on file  . Sexually Abused: Not on file   Family History  Problem Relation Age of Onset  . Diabetes Mellitus II Father   . Heart failure Father   . Kidney failure Father   . Heart attack Mother       Review of Systems  All other systems reviewed and are negative.      Objective:   Physical Exam Vitals reviewed.  Constitutional:      General: He is not in acute distress.    Appearance: He is well-developed. He is not diaphoretic.  HENT:     Head: Normocephalic and atraumatic.     Right Ear: External ear normal.     Left Ear: External ear normal.     Nose: Nose normal.     Mouth/Throat:     Pharynx: No oropharyngeal exudate.  Eyes:     General: No visual field deficit or scleral icterus.       Right eye: No discharge.        Left eye: No discharge.     Conjunctiva/sclera: Conjunctivae normal.     Pupils: Pupils are equal, round, and reactive to light.  Neck:     Thyroid: No thyromegaly.     Vascular: No JVD.     Trachea: No tracheal deviation.  Cardiovascular:     Rate and Rhythm: Normal rate and regular rhythm.     Heart sounds: Normal heart sounds. No murmur heard.  No friction rub. No gallop.   Pulmonary:     Effort: Pulmonary effort is normal. No respiratory distress.     Breath sounds: Normal breath sounds. No stridor. No wheezing or rales.  Chest:     Chest wall: No tenderness.  Abdominal:     General: Bowel sounds are normal. There is no distension.     Palpations: Abdomen is soft. There is no mass.     Tenderness: There is no abdominal tenderness. There is no guarding or rebound.  Musculoskeletal:        General: No tenderness.     Cervical back: Normal range of motion and neck  supple.  Lymphadenopathy:     Cervical: No cervical adenopathy.  Skin:    General: Skin is warm.     Coloration: Skin is not pale.     Findings: No erythema or rash.  Neurological:     General: No focal deficit present.     Mental Status: He is alert and oriented to person, place, and time.     Cranial Nerves: Cranial nerves are intact. No cranial nerve deficit, dysarthria or facial asymmetry.     Sensory: Sensation is intact. No sensory deficit.     Motor: Motor function is intact. No weakness, tremor, atrophy, abnormal muscle tone, seizure activity or pronator drift.     Coordination: Coordination is intact. Romberg sign negative. Coordination normal. Finger-Nose-Finger Test and Heel to [facility] Test normal.     Gait: Gait is intact. Gait and tandem walk normal.  Deep Tendon Reflexes: Reflexes are normal and symmetric.           Assessment & Plan:  Dyspnea on exertion  Coronary artery disease involving native coronary artery of native heart without angina pectoris  Begin the patient on Imdur 30 mg a day to see if the patient will see improvement in his exercise tolerance and also reduction in his chest pain.  I want to get the patient back into see his cardiologist as soon as possible as I am concerned that the 90% lesion in his ramus or his 80% lesion in his diagonal may have worsened and be the cause of his chest pain, fatigue, worsening stamina, and dyspnea on exertion.  I will schedule this as soon as possible.  We deferred starting Aricept at the present time

## 2020-03-13 NOTE — Patient Instructions (Signed)
° ° ° °  If you have lab work done today you will be contacted with your lab results within the next 2 weeks.  If you have not heard from us then please contact us. The fastest way to get your results is to register for My Chart. ° ° °IF you received an x-ray today, you will receive an invoice from [facility]. Please contact [facility] at [phone] with questions or concerns regarding your invoice.  ° °IF you received labwork today, you will receive an invoice from [facility]. Please contact [facility] at [phone] with questions or concerns regarding your invoice.  ° °Our billing staff will not be able to assist you with questions regarding bills from these companies. ° °You will be contacted with the lab results as soon as they are available. The fastest way to get your results is to activate your My Chart account. Instructions are located on the last page of this paperwork. If you have not heard from us regarding the results in 2 weeks, please contact this office. °  ° ° ° °

## 2020-03-13 NOTE — Telephone Encounter (Signed)
Pt seen today, however TSH not drawn, I sent 90 day only, last TSH > 1 year ago

## 2020-03-16 ENCOUNTER — Telehealth (HOSPITAL_COMMUNITY): Payer: Self-pay | Admitting: *Deleted

## 2020-03-16 NOTE — Telephone Encounter (Signed)
Pt no longer wants to see Dr.McDowell but is willing to see any other provider in the clinic. I called [facility] to schedule pt appt first available 10/26 pt can not make that appt he will be in [place]. Next available appt 11/2 at 10:15 am with [facility] appt scheduled.

## 2020-03-16 NOTE — Telephone Encounter (Signed)
He has followed with Dr. Domenic Polite in the past. Can you have him f/u in the [facility] office with Dr. Domenic Polite or Bernerd Pho. If they feel he needs repeat cath, we can schedule for next week.

## 2020-03-16 NOTE — Telephone Encounter (Signed)
Pt left VM stating he was seen by Dr.Pickard on 10/15 an for chest pain and worsening shortness of breath. Pt said Dr.Pickard told  pt to follow up with Dr.Bensimhon. pt last seen 04/2018. See Dr.Pickards note below.  Assessment & Plan:  Dyspnea on exertion  Coronary artery disease involving native coronary artery of native heart without angina pectoris  Begin the patient on Imdur 30 mg a day to see if the patient will see improvement in his exercise tolerance and also reduction in his chest pain.  I want to get the patient back into see his cardiologist as soon as possible as I am concerned that the 90% lesion in his ramus or his 80% lesion in his diagonal may have worsened and be the cause of his chest pain, fatigue, worsening stamina, and dyspnea on exertion.  I will schedule this as soon as possible.  We deferred starting Aricept at the present time    Routed to [place] for advice

## 2020-03-17 ENCOUNTER — Ambulatory Visit (INDEPENDENT_AMBULATORY_CARE_PROVIDER_SITE_OTHER): Payer: Medicare Other | Admitting: Clinical

## 2020-03-17 ENCOUNTER — Other Ambulatory Visit: Payer: Self-pay

## 2020-03-17 DIAGNOSIS — F4310 Post-traumatic stress disorder, unspecified: Secondary | ICD-10-CM

## 2020-03-17 NOTE — Progress Notes (Signed)
Virtual Visit via Video Note  I connected with Caleb Butler on 03/17/20 at 11:00 AM EDT by a video enabled telemedicine application and verified that I am speaking with the correct person using two identifiers.  Location: Patient:Home Provider:Office  I discussed the limitations, risks, security and privacy concerns of performing an evaluation and management service by telephone and the availability of in person appointments. I also discussed with the patient that there may be a patient responsible charge related to this service. The patient expressed understanding and agreed to proceed.     Session Time:11:00AM-11:40AM  Participation Level:Active  Behavioral Response:CasualAlertAnxious  Type of Therapy:Individual Therapy  Treatment Goals addressed:Anxiety  Interventions:CBT, DBT, Solution Focused, Strength-based and Supportive  Summary:Caleb M. Lambertis a 74 y.o.malewho presents with PTSD.The OPT therapist worked with thepatientfor hisinitial therapy. The OPT therapist utilized Motivational Interviewing to assist incontinuing to createtherapeutic repore. The patient in the session was engaged and work in collaboration giving feedback about his triggers and symptoms over the past few weeksincluding concern over a heart condition for which her is currently taking nitrogen pills. The OPT therapist utilized Cognitive Behavioral Therapy through cognitive restructuring as well as worked with the patient on coping strategies to assist in management ofhis diagnosis symptoms.The patient idenitfied thathe does have positive thinking and things he is currently looking forward to including traveling to [place] for his step daughters wedding next week as well as potentially traveling mid November to [place] to meet a son he recently discovered that he has been looking for over the past several years.The OPT therapist worked with the patient from a  strengths based perspective and placed emphasis on utilizing coping to help maintain stability.  Suicidal/Homicidal:Nowithout intent/plan  Therapist Response:The OPT therapist worked with the patient for the patients scheduled session. The patient was engaged in his session and gave feedback in relation to triggers, symptoms, and behavior responses over the pastfewweeks. The OPT therapist worked with the patient utilizing an in session Cognitive Behavioral Therapy exercise. The patient was responsive in the session and verbalized, "I am going to be busy and I have things to look forward too".The patient noted that he is currently taking nitrogen pills and awaiting a meeting with a Cardiologist for further treatment planning in relation to a health condition..The patient spoke about being more active and plans for the Fall.The OPT therapist will continue treatment work with the patient in his next scheduled session.  Plan: Return again2 weeks  Diagnosis:Axis I:PTSD Axis II:No diagnosis  I discussed the assessment and treatment plan with the patient. The patient was provided an opportunity to ask questions and all were answered. The patient agreed with the plan and demonstrated an understanding of the instructions.  The patient was advised to call back or seek an in-person evaluation if the symptoms worsen or if the condition fails to improve as anticipated.  I provided58minutes of non-face-to-face time during this encounter.  [name], LCSW 03/17/2020

## 2020-03-23 ENCOUNTER — Telehealth: Payer: Self-pay | Admitting: Family Medicine

## 2020-03-23 ENCOUNTER — Other Ambulatory Visit: Payer: Self-pay | Admitting: Family Medicine

## 2020-03-23 MED ORDER — NITROGLYCERIN 0.4 MG SL SUBL: 0.4000 mg | SUBLINGUAL_TABLET | SUBLINGUAL | 3 refills | Status: DC | PRN

## 2020-03-23 NOTE — Telephone Encounter (Signed)
#   [phone] Pt going out of town would like to know if Dr.Pickard can prescribe some Nytrocleen for him

## 2020-03-23 NOTE — Telephone Encounter (Signed)
Please Advise

## 2020-03-23 NOTE — Telephone Encounter (Signed)
Does he mean nitroglycerin?  I will send in nitroglycerin.  Please verify.

## 2020-03-27 NOTE — Telephone Encounter (Signed)
Yes the nitroglycerin

## 2020-03-29 ENCOUNTER — Other Ambulatory Visit: Payer: Self-pay | Admitting: Family Medicine

## 2020-03-31 ENCOUNTER — Ambulatory Visit: Payer: Medicare Other | Admitting: Physician Assistant

## 2020-04-02 NOTE — Progress Notes (Signed)
Cardiology Office Note  Date: 04/03/2020   ID: Caleb Butler, Caleb Butler May 21, 1946, MRN [id number]  PCP:  Susy Frizzle, MD  Cardiologist:  No primary care provider on file. Electrophysiologist:  None   Chief Complaint: Follow up DOE / CP  History of Present Illness: Caleb Butler is a 74 y.o. male with a history of  CAD, Arthritis, anxiety, chronic lower back pain,GERD, HLD, Hypothyroidism, DM2, staggering gait and falls.   Patient being referred back to cardiology for recent complaints of dyspnea and chest pain by PCP.  Patient has a previous history of coronary artery disease with stent placements.  See cardiac catheterization report below.  Previously seen by both Dr. Domenic Polite and [name].  Last encounter with Longstanding history of progressive dyspnea on exertion/NYHA II-3.  RHC/LHC February 2018 demonstrating 90% ostial LCx lesion involving the ostium of the ramus.  40% proximal RCA lesion.  It was felt at the time this was not favorable to PCI but he continued to have symptoms and underwent PCI of ostial LCx and Cutting Balloon with DES on January 06, 2017.   Later seen in October 2019 by [name] for worsening chest pain and shortness of breath.  Repeat cath was reassuring.  He reported feeling better but still with DOE.  Recently seen by his primary care provider on 03/13/2020 with complaints of DOE and chest pain.  He was started on Imdur 30 mg daily.  PCP was concerned 90% ramus lesion or 80% diagonal lesion may have worsened because of chest pain, fatigue, worsening stamina/dyspnea.  Patient is here for follow-up and recent complaints of increased dyspnea and episodes of chest pain.  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  Had an episode last week with chest pain while driving.  Also has some associated shortness of breath with chest pain.  He said no pulmonary work-up which showed no evidence of a pulmonary  cause of his dyspnea on exertion.  His primary care provider was concerned he may have some advancing coronary artery disease explaining the dyspnea.  Patient has a history of diabetes and the dyspnea was explained by the PCP to the patient that it could be an anginal equivalent.  Patient also has a history of ascending aortic aneurysm which last was checked on 03/23/2016 demonstrated an ascending aortic aneurysm of 4.3 cm.  There have been no follow-up diagnostics performed since that date.  2 weeks ago he was given Imdur by PCP which he states his seem to alleviate the chest pain.  He has had no further chest pain since initiation of the medication. Last cardiac catheterization 03/13/2018 demonstrated widely patent stent in ostial LCx, patent stent in LM with 20% stenosis, unchanged 90% lesion and ostium of moderate sized first diagonal, ramus lesion otherwise mild nonobstructive CAD.   Past Medical History:  Diagnosis Date  . Anxiety   . Arthritis    "my back is loaded w/it" (01/06/2017)  . CAD (coronary artery disease)    80% ostial lesion in L circumflex (2017)  . Chronic lower back pain   . GERD (gastroesophageal reflux disease)   . Hiatal hernia   . High cholesterol   . Hypothyroidism   . IBS (irritable bowel syndrome)   . Numbness and tingling of right lower extremity    "since 11/30/2016" (01/06/2017)  . PMR (polymyalgia rheumatica) (HCC)   . Type 2 diabetes mellitus ([place])     Past Surgical History:  Procedure Laterality Date  . CARDIAC CATHETERIZATION  06/2016  . CORONARY ANGIOPLASTY WITH STENT PLACEMENT  01/06/2017  . CORONARY STENT INTERVENTION N/A 01/06/2017   Procedure: CORONARY STENT INTERVENTION;  Surgeon: Tonny Bollman, MD;  Location: [facility] INVASIVE CV LAB;  Service: Cardiovascular;  Laterality: N/A;  . FINGER SURGERY Left 1984   ring finger reattached.   . FOREARM FRACTURE SURGERY Right 2004   "has 3 rods and 22 screws in it"  . INGUINAL HERNIA REPAIR Left   . RIGHT/LEFT  HEART CATH AND CORONARY ANGIOGRAPHY N/A 07/07/2016   Procedure: Right/Left Heart Cath and Coronary Angiography;  Surgeon: Dolores Patty, MD;  Location: [facility] INVASIVE CV LAB;  Service: Cardiovascular;  Laterality: N/A;  . RIGHT/LEFT HEART CATH AND CORONARY ANGIOGRAPHY N/A 03/13/2018   Procedure: RIGHT/LEFT HEART CATH AND CORONARY ANGIOGRAPHY;  Surgeon: Dolores Patty, MD;  Location: [facility] INVASIVE CV LAB;  Service: Cardiovascular;  Laterality: N/A;    Current Outpatient Medications  Medication Sig Dispense Refill  . ACCU-CHEK AVIVA PLUS test strip USE TO CHECK BLOOD SUGAR TWICE DAILY E11.9 100 strip 12  . Accu-Chek FastClix Lancets MISC USE TO CHECK BLOOD SUGAR TWICE DAILY E11.9 102 each 12  . acetaminophen (TYLENOL) 500 MG tablet Take 500-1,000 mg by mouth daily as needed for moderate pain or headache.    Marland Kitchen aspirin 81 MG EC tablet Take 81 mg by mouth at bedtime.     . blood glucose meter kit and supplies Dispense based on patient and insurance preference. Use up to four times daily as directed. (FOR ICD-10 E10.9, E11.9). 1 each 0  . clopidogrel (PLAVIX) 75 MG tablet Take 1 tablet (75 mg total) by mouth daily. Needs appt for further refills 90 tablet 0  . folic acid (FOLVITE) 1 MG tablet Take 1 mg by mouth daily.    . isosorbide mononitrate (IMDUR) 30 MG 24 hr tablet Take 1 tablet (30 mg total) by mouth daily. 30 tablet 6  . levothyroxine (SYNTHROID) 100 MCG tablet TAKE 1 TABLET BY MOUTH DAILY BEFORE BREAKFAST. 90 tablet 0  . methotrexate (RHEUMATREX) 2.5 MG tablet Take 17.5 mg by mouth as directed. Takes 6 tabs per week    . mometasone (ELOCON) 0.1 % ointment Apply topically daily. 45 g 0  . Multiple Vitamin (MULTIVITAMIN WITH MINERALS) TABS tablet Take 1 tablet by mouth daily.    . nitroGLYCERIN (NITROSTAT) 0.4 MG SL tablet Place 1 tablet (0.4 mg total) under the tongue every 5 (five) minutes as needed for chest pain. 50 tablet 3  . rosuvastatin (CRESTOR) 20 MG tablet Take 1 tablet (20 mg  total) by mouth daily. Last refill without office visit [phone] 90 tablet 0  . tiZANidine (ZANAFLEX) 4 MG tablet Take 1 tablet (4 mg total) by mouth every 6 (six) hours as needed for muscle spasms. 30 tablet 0  . TRULICITY 0.75 MG/0.5ML SOPN INJECT 0.75 MG INTO THE SKIN ONCE A WEEK FOR 4 DOSES. X4 WEEKS, THEN D/C 2 mL 0  . vitamin B-12 (CYANOCOBALAMIN) 1000 MCG tablet Take 1,000 mcg by mouth daily.     No current facility-administered medications for this visit.   Allergies:  Bee venom, Ceclor [cefaclor], Ciprofloxacin, Jardiance [empagliflozin], and Metformin and related   Social History: The patient  reports that he has never smoked. He has never used smokeless tobacco. He reports current alcohol use of about 2.0 standard drinks of alcohol per week. He reports that he does not use drugs.   Family History: The patient's family  history includes Diabetes Mellitus II in his father; Heart attack in his mother; Heart failure in his father; Kidney failure in his father.   ROS:  Please see the history of present illness. Otherwise, complete review of systems is positive for none.  All other systems are reviewed and negative.   Physical Exam: VS:  BP 104/62   Pulse 85   Ht 5' 9.5" (1.765 m)   Wt 185 lb 9.6 oz (84.2 kg)   SpO2 92%   BMI 27.02 kg/m , BMI Body mass index is 27.02 kg/m.  Wt Readings from Last 3 Encounters:  04/03/20 185 lb 9.6 oz (84.2 kg)  03/13/20 188 lb 12.8 oz (85.6 kg)  02/20/20 188 lb (85.3 kg)    General: Patient appears comfortable at rest. Neck: Supple, no elevated JVP or carotid bruits, no thyromegaly. Lungs: Clear to auscultation, nonlabored breathing at rest. Cardiac: Regular rate and rhythm, no S3 or significant systolic murmur, no pericardial rub. Extremities: No pitting edema, distal pulses 2+. Skin: Warm and dry. Musculoskeletal: No kyphosis. Neuropsychiatric: Alert and oriented x3, affect grossly appropriate.  ECG:  An ECG dated 04/03/2020 was  personally reviewed today and demonstrated:  Normal sinus rhythm rate of 86.  Recent Labwork: 02/20/2020: ALT 22; AST 20; BUN 21; Creat 1.21; Hemoglobin 14.9; Platelets 203; Potassium 4.2; Sodium 139     Component Value Date/Time   CHOL 107 08/12/2019 1027   TRIG 103 08/12/2019 1027   HDL 40 08/12/2019 1027   CHOLHDL 2.7 08/12/2019 1027   VLDL 42 (H) 11/30/2015 1030   LDLCALC 48 08/12/2019 1027    Other Studies Reviewed Today:   Left and right heart cath 03/13/2018 RIGHT/LEFT HEART CATH AND CORONARY ANGIOGRAPHY  Conclusion    LM lesion is 20% stenosed.  Ramus lesion is 90% stenosed.  Prox RCA lesion is 40% stenosed.  Ost 1st Diag to 1st Diag lesion is 90% stenosed.  Prox LAD lesion is 30% stenosed.  Previously placed Ost Cx to Prox Cx stent (unknown type) is widely patent.  Ost LM to Mid LM lesion is 20% stenosed.   1. CAD with widely patent stent in ostial LCX 2. Unchanged 90% lesion in ostium of moderate-sized first diagonal 3. Otherwise mild non-obstructive CAD 4. EF 55-60% 6. Normal RHC numbers  LCX stent and hemodynamics look very good. Unchanged lesion in ostium of moderate-sized 1st diagonal. Cine films reviewed with interventional team who agreed diagonal lesion is best treated medically.  Diagnostic Dominance: Right   Echocardiogram 03/20/2018  Study Conclusions   - Left ventricle: The cavity size was normal. Wall thickness was  increased in a pattern of mild LVH. Systolic function was normal.  The estimated ejection fraction was in the range of 55% to 60%.  Wall motion was normal; there were no regional wall motion  abnormalities. Doppler parameters are consistent with abnormal  left ventricular relaxation (grade 1 diastolic dysfunction).  - Aortic valve: There was mild regurgitation.  - Mitral valve: There was mild regurgitation  Cardiac cath 07/07/16   The left ventricular ejection fraction is 55-65% by visual estimate.  Prox  RCA lesion, 40 %stenosed.  Ost Cx lesion, 90 %stenosed.  Ramus lesion, 90 %stenosed.  LM lesion, 20 %stenosed.  Mid LAD lesion, 20 %stenosed.  1st Diag lesion, 80 %stenosed. Findings: RA = 5 RV = 23/8 PA = 27/4 (16) PCW = 3 Fick cardiac output/index = 5.2/2.6 PVR = 2.5 WU  Ao sat =95% PA sat = 68%, 69%  1. 1-V  CAD at ostium of LCX also involving ostium of ramus 2. Aneursymal proximal LAD with mild plaque 3. 40% Proximal RCA 4. Normal LVEF with normal hemodynamics  Echo 02/2016 LVEF 60-65%, Grade 2 DD, Mild AI, Trivial MR, Pa pear pressure 24 mm Hg. Normal RV.  High res Chest CT 03/13/2016 - No evidence of ILD. Mild patchy bibasilar scarring and volume loss. Incidental finding of AAA. + coronary artery calcification, + right adrenal myelolipoma.   PFTS 11/2015 FVC 2.75 (64%) FEV1 2.05 (65%) TLC 5.26 (77%) DLCO 20.70 (66%) -> corrects to 100% with alveolar volume.   CPX 05/06/16 Pre-Exercise PFTs  FVC 3.05 (74%)    FEV1 2.19 (70%)       FEV1/FVC 72 (94%)     MVV 86 (69%) Post-Exercise PFTs ((from lowest post-exercise trial (%change from rest)) FVC performed IPE, 5, 10, 15 mins FVC 3.19 (+6%) IPE    FEV1 2.44 (+11%)  10 mins     FEV1/FVC 73 (+1%)    Exercise Time:  8:30      Watts: 120 RPE: 19 Reason stopped: Patient ended test due to dyspnea (9/10) and leg fatigue Additional symptoms: Back pain (7-8/10) with weight on left leg Resting HR: 79 Peak HR: 135  (89% age predicted max HR) BP rest: 134/66 BP peak: 128/66 Peak VO2: 19.8 (86% predicted peak VO2) VE/VCO2 slope: 44 OUES: 1.46 Peak RER: 1.11 Ventilatory Threshold: 15.2 (66% predicted or measured peak VO2) Peak RR 53 Peak Ventilation: 88.8 VE/MVV: 103% PETCO2 at peak: 22 O2pulse: 12  (92% predicted O2pulse)    Assessment and Plan:  1. CAD in native artery   2. DOE (dyspnea on exertion)   3. Mixed hyperlipidemia   4. Type 2 diabetes mellitus without  complication, without long-term current use of insulin ([place])   5. Thoracic aortic aneurysm without rupture (HCC)   6. Chest pain of uncertain etiology   7. Pre-procedure lab exam    1. 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  Chest pain is associated with increased dyspnea.  Patient was recently prescribed Imdur 30 mg by PCP.  He states since that time approximately 2 weeks ago he has had no further chest pain.  However, DOE may be an anginal equivalent.  History of CAD with stents to left main and left circumflex.  Last cardiac catheterization:03/13/2018: LM 20%, ramus 90%, proximal RCA 40%, ostial first diagonal to first diagonal lesion 90%, proximal LAD lesion 30%.  Previously placed ostial LCx to proximal LCx widely patent, ostial LM to mid LM lesion 20% stenosed.  Continue aspirin 81 mg daily, Plavix 75 mg daily, Imdur 30 mg daily, and sublingual nitroglycerin as needed for chest pain. Please get a Lexiscan cardiac stress test to reassess for ischemia.   2. DOE (dyspnea on exertion)/shortness of breath Patient continues to have worsening dyspnea on exertion.  States he has had a significant work-up on his lungs in the past for examination of pulmonary etiology for shortness of breath.  He states there is no evidence of pulmonary disease.  He has been exposed to agent orange in the past during the [place] War.  Last echocardiogram performed March 20, 2018.  Patient had EF 50 to 55%.  Mild LVH, G1 DD, mild aortic and mitral regurgitation.  Please get a repeat echocardiogram to assess LV function, diastolic function and valvular function.  3. Mixed hyperlipidemia Continue Crestor 20 mg p.o. daily.  Most recent lipid panel 08/12/2019: TC 107, HDL 40, TG 103, LDL 48.  4. Type 2 diabetes mellitus without complication, without long-term current use of insulin (HCC) Recent hemoglobin A1c  7.5%.  Patient follows with PCP for management.  5. Ascending aortic aneurysm (HCC) High resolution CT chest 03/23/2016: Ascending aorta measures 4.3 cm. Annual with follow up CTA or MRI.  Please get a repeat CTA chest, aorta to reassess ascending aortic aneurysm.   Medication Adjustments/Labs and Tests Ordered: Current medicines are reviewed at length with the patient today.  Concerns regarding medicines are outlined above.   Disposition: Follow-up with Dr. Harl Bowie or APP 6 weeks  Signed, Levell July, NP 04/03/2020 12:53 PM    [facility] [place], [place], [place] [address] Phone: [phone]; Fax: [phone]

## 2020-04-03 ENCOUNTER — Ambulatory Visit: Payer: Medicare Other | Admitting: Family Medicine

## 2020-04-03 ENCOUNTER — Encounter: Payer: Self-pay | Admitting: *Deleted

## 2020-04-03 ENCOUNTER — Encounter: Payer: Self-pay | Admitting: Family Medicine

## 2020-04-03 VITALS — BP 104/62 | HR 85 | Ht 69.5 in | Wt 185.6 lb

## 2020-04-03 DIAGNOSIS — I712 Thoracic aortic aneurysm, without rupture, unspecified: Secondary | ICD-10-CM

## 2020-04-03 DIAGNOSIS — E119 Type 2 diabetes mellitus without complications: Secondary | ICD-10-CM | POA: Diagnosis not present

## 2020-04-03 DIAGNOSIS — I2510 Atherosclerotic heart disease of native coronary artery without angina pectoris: Secondary | ICD-10-CM

## 2020-04-03 DIAGNOSIS — R079 Chest pain, unspecified: Secondary | ICD-10-CM | POA: Diagnosis not present

## 2020-04-03 DIAGNOSIS — Z01812 Encounter for preprocedural laboratory examination: Secondary | ICD-10-CM

## 2020-04-03 DIAGNOSIS — R0609 Other forms of dyspnea: Secondary | ICD-10-CM

## 2020-04-03 DIAGNOSIS — R0600 Dyspnea, unspecified: Secondary | ICD-10-CM | POA: Diagnosis not present

## 2020-04-03 DIAGNOSIS — E782 Mixed hyperlipidemia: Secondary | ICD-10-CM

## 2020-04-03 NOTE — Patient Instructions (Addendum)
Medication Instructions:  Continue all current medications.  Labwork:  BMET - order given today.   Please do prior to CTA.  Testing/Procedures:  Your physician has requested that you have a lexiscan myoview. For further information please visit [email or web address]. Please follow instruction sheet, as given.  Your physician has requested that you have an echocardiogram. Echocardiography is a painless test that uses sound waves to create images of your heart. It provides your doctor with information about the size and shape of your heart and how well your heart's chambers and valves are working. This procedure takes approximately one hour. There are no restrictions for this procedure.  CTA of chest / aorta   Office will contact with results via phone or letter.    Follow-Up: 6 weeks   Any Other Special Instructions Will Be Listed Below (If Applicable).  If you need a refill on your cardiac medications before your next appointment, please call your pharmacy.

## 2020-04-06 ENCOUNTER — Telehealth: Payer: Self-pay | Admitting: Family Medicine

## 2020-04-06 NOTE — Telephone Encounter (Signed)
Pre-cert Verification for the following procedure    CT ANGIO CHEST AORTA W/ CM   DATE:04/29/2020  LOCATION:[facility]

## 2020-04-06 NOTE — Addendum Note (Signed)
Addended by: Julian Hy T on: 04/06/2020 11:02 AM   Modules accepted: Orders

## 2020-04-07 ENCOUNTER — Ambulatory Visit (INDEPENDENT_AMBULATORY_CARE_PROVIDER_SITE_OTHER): Payer: Medicare Other | Admitting: Clinical

## 2020-04-07 ENCOUNTER — Other Ambulatory Visit: Payer: Self-pay

## 2020-04-07 DIAGNOSIS — F4310 Post-traumatic stress disorder, unspecified: Secondary | ICD-10-CM

## 2020-04-07 NOTE — Progress Notes (Signed)
Virtual Visit via Telephone Note  I connected with Caleb Butler on 04/07/20 at 11:00 AM EST by telephone and verified that I am speaking with the correct person using two identifiers.  Location: Patient: Home Provider: Office   I discussed the limitations, risks, security and privacy concerns of performing an evaluation and management service by telephone and the availability of in person appointments. I also discussed with the patient that there may be a patient responsible charge related to this service. The patient expressed understanding and agreed to proceed.                                  THERAPY SESSION  Session Time:11:00AM-11:55AM  Participation Level:Active  Behavioral Response:CasualAlertAnxious  Type of Therapy:Individual Therapy  Treatment Goals addressed:Anxiety  Interventions:CBT, DBT, Solution Focused, Strength-based and Supportive  Summary:Caleb M. Lambertis a 74 y.o.malewho presents with PTSD.The OPT therapist worked with thepatientfor his ongoing therapy. The OPT therapist utilized Motivational Interviewing to assist incontinuing to createtherapeutic repore. The patient in the session was engaged and work in collaboration giving feedback about his triggers and symptoms over the past few weeksincludingongoing concern over a heart condition for which he will be doing upcoming cardiology testing and continues to take medication for. The OPT therapist utilized Cognitive Behavioral Therapy through cognitive restructuring as well as worked with the patient on coping strategies to assist in management ofhis diagnosis symptoms.The patient idenitfied thathe does have positive thinking and things he is currently looking forward to including traveling to [place] to reunite with his son he has recently found through [place] and DNA testing.The OPT therapist worked with the patient from a strengths based perspective and placed emphasis on  utilizing coping to help maintain stability.  Suicidal/Homicidal:Nowithout intent/plan  Therapist Response:The OPT therapist worked with the patient for the patients scheduled session. The patient was engaged in his session and gave feedback in relation to triggers, symptoms, and behavior responses over the pastfewweeks. The OPT therapist worked with the patient utilizing an in session Cognitive Behavioral Therapy exercise. The patient was responsive in the session and verbalized, "I continue to look forward to meeting with my son and I have some upcoming test to evaluate my heart condition I am hoping it will be able to be treated with medication and I will not need surgery".The patient noted that he is currently taking nitrogen pills and awaiting further Cardiology testing  for further treatment planning in relation to a health condition..The patient spoke aboutbeing more active and his ongoing plans for the Fall.The OPT therapist will continue treatment work with the patient in his next scheduled session.  Plan: Return again2 weeks  Diagnosis:Axis I:PTSD Axis II:No diagnosis  I discussed the assessment and treatment plan with the patient. The patient was provided an opportunity to ask questions and all were answered. The patient agreed with the plan and demonstrated an understanding of the instructions.  The patient was advised to call back or seek an in-person evaluation if the symptoms worsen or if the condition fails to improve as anticipated.  I provided23minutes of non-face-to-face time during this encounter.  Caleb Grumbles, LCSW 04/07/2020

## 2020-04-09 ENCOUNTER — Ambulatory Visit (HOSPITAL_COMMUNITY)
Admission: RE | Admit: 2020-04-09 | Discharge: 2020-04-09 | Disposition: A | Discharge status: Home / Self Care | Payer: Medicare Other | Source: Ambulatory Visit | Attending: Family Medicine | Admitting: Family Medicine

## 2020-04-09 ENCOUNTER — Encounter (HOSPITAL_COMMUNITY): Payer: Self-pay

## 2020-04-09 ENCOUNTER — Encounter (HOSPITAL_COMMUNITY)
Admission: RE | Admit: 2020-04-09 | Discharge: 2020-04-09 | Disposition: A | Discharge status: Home / Self Care | Payer: Medicare Other | Source: Ambulatory Visit | Attending: Family Medicine | Admitting: Family Medicine

## 2020-04-09 ENCOUNTER — Other Ambulatory Visit: Payer: Self-pay

## 2020-04-09 DIAGNOSIS — R079 Chest pain, unspecified: Secondary | ICD-10-CM

## 2020-04-09 LAB — NM MYOCAR MULTI W/SPECT W/WALL MOTION / EF
LV dias vol: 84 mL (ref 62–150)
LV sys vol: 32 mL
Peak HR: 99 {beats}/min
RATE: 0.33
Rest HR: 64 {beats}/min
SDS: 4
SRS: 2
SSS: 6
TID: 0.89

## 2020-04-09 MED ORDER — SODIUM CHLORIDE FLUSH 0.9 % IV SOLN
INTRAVENOUS | Status: AC
  Filled 2020-04-09: qty 10

## 2020-04-09 MED ORDER — TECHNETIUM TC 99M TETROFOSMIN IV KIT
10.0000 | PACK | Freq: Once | INTRAVENOUS | Status: AC | PRN
  Administered 2020-04-09: 9.7 via INTRAVENOUS

## 2020-04-09 MED ORDER — REGADENOSON 0.4 MG/5ML IV SOLN
INTRAVENOUS | Status: AC
  Administered 2020-04-09: 0.4 mg via INTRAVENOUS
  Filled 2020-04-09: qty 5

## 2020-04-09 MED ORDER — TECHNETIUM TC 99M TETROFOSMIN IV KIT
30.0000 | PACK | Freq: Once | INTRAVENOUS | Status: AC | PRN
  Administered 2020-04-09: 30.5 via INTRAVENOUS

## 2020-04-13 ENCOUNTER — Telehealth: Payer: Self-pay | Admitting: *Deleted

## 2020-04-13 NOTE — Telephone Encounter (Signed)
Laurine Blazer, LPN  16/61/9694 0:98 PM EST Back to Top    Notified, copy to pcp.

## 2020-04-13 NOTE — Telephone Encounter (Signed)
-----   Message from Verta Ellen., NP sent at 04/10/2020  9:42 AM EST ----- Please call the patient and let him know that the stress test did not show any evidence of significant lack of blood flow to the heart muscle.  This is considered a low risk study.  Thank you

## 2020-04-14 ENCOUNTER — Ambulatory Visit (INDEPENDENT_AMBULATORY_CARE_PROVIDER_SITE_OTHER): Payer: Medicare Other

## 2020-04-14 DIAGNOSIS — R0600 Dyspnea, unspecified: Secondary | ICD-10-CM

## 2020-04-14 DIAGNOSIS — R0609 Other forms of dyspnea: Secondary | ICD-10-CM

## 2020-04-14 LAB — ECHOCARDIOGRAM COMPLETE
Area-P 1/2: 2.46 cm2
Calc EF: 63.7 %
MV M vel: 5.46 m/s
MV Peak grad: 119.2 mmHg
P 1/2 time: 528 msec
S' Lateral: 3.25 cm
Single Plane A2C EF: 65.9 %
Single Plane A4C EF: 60 %

## 2020-04-15 ENCOUNTER — Telehealth: Payer: Self-pay | Admitting: *Deleted

## 2020-04-15 NOTE — Telephone Encounter (Signed)
-----   Message from Verta Ellen., NP sent at 04/14/2020 12:35 PM EST ----- Please call the patient let him know the echocardiogram showed he has good pumping function of his heart.  He has some mild to moderately leaking mitral valve.  Mildly leaking aortic valve.  Leaking valve may contribute some to his dyspnea on exertion.  When compared to the previous echo March 20, 2018.  The mitral valve leaking is only slightly increased.  Back then it was mildly leaking.  Aortic valve is unchanged.  It had some mild leaking back in 2019.

## 2020-04-15 NOTE — Telephone Encounter (Signed)
Laurine Blazer, LPN  96/94/0982 8:67 PM EST Back to Top    Notified, copy to pcp.

## 2020-04-27 ENCOUNTER — Other Ambulatory Visit (HOSPITAL_COMMUNITY)
Admission: RE | Admit: 2020-04-27 | Discharge: 2020-04-27 | Disposition: A | Discharge status: Home / Self Care | Payer: Medicare Other | Source: Ambulatory Visit | Attending: Family Medicine | Admitting: Family Medicine

## 2020-04-27 DIAGNOSIS — M069 Rheumatoid arthritis, unspecified: Secondary | ICD-10-CM | POA: Diagnosis present

## 2020-04-27 LAB — BASIC METABOLIC PANEL
Anion gap: 8 (ref 5–15)
BUN: 20 mg/dL (ref 8–23)
CO2: 26 mmol/L (ref 22–32)
Calcium: 9.6 mg/dL (ref 8.9–10.3)
Chloride: 103 mmol/L (ref 98–111)
Creatinine, Ser: 1.23 mg/dL (ref 0.61–1.24)
GFR, Estimated: >60 mL/min (ref >60)
Glucose, Bld: 143 mg/dL — ABNORMAL HIGH (ref 70–99)
Potassium: 4.6 mmol/L (ref 3.5–5.1)
Sodium: 137 mmol/L (ref 135–145)

## 2020-04-28 ENCOUNTER — Telehealth: Payer: Self-pay | Admitting: *Deleted

## 2020-04-28 NOTE — Telephone Encounter (Signed)
-----   Message from Verta Ellen., NP sent at 04/27/2020 11:18 AM EST ----- Lab work looks good except for blood sugar is 143.  Thank you

## 2020-04-28 NOTE — Telephone Encounter (Signed)
Laurine Blazer, LPN  15/95/3967 2:89 PM EST Back to Top    Notified, copy to pcp.

## 2020-04-29 ENCOUNTER — Other Ambulatory Visit: Payer: Self-pay

## 2020-04-29 ENCOUNTER — Ambulatory Visit (HOSPITAL_COMMUNITY)
Admission: RE | Admit: 2020-04-29 | Discharge: 2020-04-29 | Disposition: A | Discharge status: Home / Self Care | Payer: Medicare Other | Source: Ambulatory Visit | Attending: Family Medicine | Admitting: Family Medicine

## 2020-04-29 ENCOUNTER — Ambulatory Visit (INDEPENDENT_AMBULATORY_CARE_PROVIDER_SITE_OTHER): Payer: Medicare Other | Admitting: Clinical

## 2020-04-29 DIAGNOSIS — I712 Thoracic aortic aneurysm, without rupture, unspecified: Secondary | ICD-10-CM

## 2020-04-29 DIAGNOSIS — F4310 Post-traumatic stress disorder, unspecified: Secondary | ICD-10-CM

## 2020-04-29 MED ORDER — IOHEXOL 350 MG/ML SOLN
100.0000 mL | Freq: Once | INTRAVENOUS | Status: AC | PRN
  Administered 2020-04-29: 100 mL via INTRAVENOUS

## 2020-04-29 NOTE — Progress Notes (Signed)
Virtual Visit via Video Note  I connected with Caleb Butler on 04/29/20 at 11:00 AM EST by a video enabled telemedicine application and verified that I am speaking with the correct person using two identifiers.  Location: Patient: Home Provider: Office   I discussed the limitations of evaluation and management by telemedicine and the availability of in person appointments. The patient expressed understanding and agreed to proceed.                THERAPY PROGRESS NOTE   Session Time:11:00AM-11:40AM  Participation Level:Active  Behavioral Response:CasualAlertAnxious  Type of Therapy:Individual Therapy  Treatment Goals addressed:Anxiety  Interventions:CBT, DBT, Solution Focused, Strength-based and Supportive  Summary:Caleb M. Lambertis a74y.o.malewho presents withPTSD.The OPT therapist worked with thepatientfor his ongoingtherapy. The OPT therapist utilized Motivational Interviewing to assist incontinuing to createtherapeutic repore. The patient in the session was engaged and work in collaboration giving feedback about his triggers and symptoms over the past few weeksincludingongoing concern over a heart condition and the upcoming opportunity in the upcoming weekend to meet with his son who he has been looking for over the past several years. The OPT therapist utilized Cognitive Behavioral Therapy through cognitive restructuring as well as worked with the patient on coping strategies to assist in management ofhis diagnosissymptoms.The patient idenitfied thathe does have positive thinking and things he is currently looking forward to including meeting with his son in the upcoming week as his son and girlfriend are traveling up to the patients home to stay for the weekend.  Suicidal/Homicidal:Nowithout intent/plan  Therapist Response:The OPT therapist worked with the patient for the patients scheduled session. The patient was engaged in his  session and gave feedback in relation to triggers, symptoms, and behavior responses over the pastfewweeks. The OPT therapist worked with the patient utilizing an in session Cognitive Behavioral Therapy exercise. The patient was responsive in the session and verbalized, "Iam both anxious and excited to finally be able to meet my son in person".The patient noted that heis continuing to be active in his heart condition treatment. 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  Plan: Return again2 weeks  Diagnosis:Axis I:PTSD Axis II:No diagnosis  I discussed the assessment and treatment plan with the patient. The patient was provided an opportunity to ask questions and all were answered. The patient agreed with the plan and demonstrated an understanding of the instructions.  The patient was advised to call back or seek an in-person evaluation if the symptoms worsen or if the condition fails to improve as anticipated.  I provided76minutes of non-face-to-face time during this encounter.  [name], LCSW 04/29/2020

## 2020-05-03 ENCOUNTER — Other Ambulatory Visit (HOSPITAL_COMMUNITY): Payer: Self-pay | Admitting: Internal Medicine

## 2020-05-03 DIAGNOSIS — I2510 Atherosclerotic heart disease of native coronary artery without angina pectoris: Secondary | ICD-10-CM

## 2020-05-04 ENCOUNTER — Telehealth: Payer: Self-pay | Admitting: *Deleted

## 2020-05-04 ENCOUNTER — Other Ambulatory Visit: Payer: Self-pay | Admitting: Family Medicine

## 2020-05-04 NOTE — Telephone Encounter (Signed)
Patient informed. Copy sent to PCP °

## 2020-05-04 NOTE — Telephone Encounter (Signed)
-----   Message from Verta Ellen., NP sent at 04/30/2020  8:15 AM EST ----- Please call the patient and tell him the CT scan of his thoracic aneurysm looks better than the last CT that was done.  Recent management shows the dimensions of 3.8 x 3.9 cm.  The previous CT showed it was 4.3 cm, so this looks better than the last scan back in 2017.  Thank you

## 2020-05-05 ENCOUNTER — Telehealth: Payer: Self-pay

## 2020-05-05 NOTE — Telephone Encounter (Signed)
Patient called to let us know the [facility] supplies trulicity, the [facility] does not prescribe Ozempic, and would like to know if he could take this

## 2020-05-14 ENCOUNTER — Ambulatory Visit (INDEPENDENT_AMBULATORY_CARE_PROVIDER_SITE_OTHER): Payer: Medicare Other | Admitting: Family Medicine

## 2020-05-14 ENCOUNTER — Other Ambulatory Visit: Payer: Self-pay

## 2020-05-14 ENCOUNTER — Other Ambulatory Visit: Payer: Self-pay | Admitting: *Deleted

## 2020-05-14 VITALS — BP 130/78 | HR 85 | Temp 97.6°F | Ht 69.0 in | Wt 185.0 lb

## 2020-05-14 DIAGNOSIS — F419 Anxiety disorder, unspecified: Secondary | ICD-10-CM | POA: Diagnosis not present

## 2020-05-14 DIAGNOSIS — R0600 Dyspnea, unspecified: Secondary | ICD-10-CM

## 2020-05-14 DIAGNOSIS — R0609 Other forms of dyspnea: Secondary | ICD-10-CM

## 2020-05-14 DIAGNOSIS — I2510 Atherosclerotic heart disease of native coronary artery without angina pectoris: Secondary | ICD-10-CM | POA: Diagnosis not present

## 2020-05-14 DIAGNOSIS — R413 Other amnesia: Secondary | ICD-10-CM

## 2020-05-14 MED ORDER — ESCITALOPRAM OXALATE 10 MG PO TABS: 10.0000 mg | ORAL_TABLET | Freq: Every day | ORAL | 3 refills | Status: DC

## 2020-05-14 MED ORDER — OZEMPIC (0.25 OR 0.5 MG/DOSE) 2 MG/1.5ML ~~LOC~~ SOPN: 0.5000 mg | PEN_INJECTOR | SUBCUTANEOUS | 3 refills | Status: AC

## 2020-05-14 MED ORDER — OZEMPIC (0.25 OR 0.5 MG/DOSE) 2 MG/1.5ML ~~LOC~~ SOPN
0.5000 mg | PEN_INJECTOR | SUBCUTANEOUS | 3 refills | Status: DC
Start: 2020-05-14 — End: 2020-05-14

## 2020-05-14 MED ORDER — OZEMPIC (0.25 OR 0.5 MG/DOSE) 2 MG/1.5ML ~~LOC~~ SOPN: 0.5000 mg | PEN_INJECTOR | SUBCUTANEOUS | 3 refills | Status: DC
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
## 2020-05-14 NOTE — Progress Notes (Signed)
Cardiology Office Note  Date: 05/15/2020   ID: Constant, [place] 04/23/1946, MRN [id number]  PCP:  Susy Frizzle, MD  Cardiologist:  No primary care provider on file. Electrophysiologist:  None   Chief Complaint: Follow up DOE / CP  History of Present Illness: Caleb Butler is a 74 y.o. male with a history of  CAD, Arthritis, anxiety, chronic lower back pain,GERD, HLD, Hypothyroidism, DM2, staggering gait and falls.  Patient being referred back to cardiology for recent complaints of dyspnea and chest pain by PCP.  Patient has a previous history of coronary artery disease with stent placements.  See cardiac catheterization report below.  Previously seen by both Dr. Domenic Polite and [name].   He had longstanding history of progressive dyspnea on exertion/NYHA II-3.  RHC/LHC February 2018 demonstrating 90% ostial LCx lesion involving the ostium of the ramus.  40% proximal RCA lesion.  It was felt at the time this was not favorable to PCI but he continued to have symptoms and underwent PCI of ostial LCx and Cutting Balloon with DES on January 06, 2017.   Later seen in October 2019 by [name] for worsening chest pain and shortness of breath.  Repeat cath was reassuring.  He reported feeling better but still with DOE.  Recently seen by his primary care provider on 03/13/2020 with complaints of DOE and chest pain.  He was started on Imdur 30 mg daily.  PCP was concerned 90% ramus lesion or 80% diagonal lesion may have worsened because of chest pain, fatigue, worsening stamina/dyspnea.  Patient last here for follow-up 04/03/2020 with recent complaints of increased dyspnea and episodes of chest pain.  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  Had an episode the prior week with chest pain while driving.  Also had some associated shortness of breath with chest pain.  Had a pulmonary work-up which showed no evidence of a  pulmonary cause of his dyspnea on exertion.  His primary care provider was concerned he may have some advancing coronary artery disease explaining the dyspnea.  History of diabetes and  dyspnea was explained by the PCP to the patient that it could be an anginal equivalent.  Patient also has a history of ascending aortic aneurysm which last was checked on 03/23/2016 demonstrating an ascending aortic aneurysm of 4.3 cm.  There head been no follow-up diagnostics performed since that date. Two weeks prior to visit he was given Imdur by PCP which he stated seemed to alleviate the chest pain.  He  had no further chest pain since initiation of the medication. Last cardiac catheterization 03/13/2018 demonstrated widely patent stent in ostial LCx, patent stent in LM with 20% stenosis, unchanged 90% lesion and ostium of moderate sized first diagonal, ramus lesion otherwise mild nonobstructive CAD.  He is here for follow-up status post recent stress test, echocardiogram and CT angio for evaluation of aortic aneurysm.  He denies any anginal or exertional symptoms.  States he had some minor sharp brief chest pains without radiation or associated symptoms last night and the night before.  No palpitations or arrhythmias, orthostatic symptoms, CVA or TIA-like symptoms, PND, orthopnea, bleeding, claudication-like symptoms, DVT or PE-like symptoms, or lower extremity edema.  States he was exposed to agent orange in the [place] War and believes he has some issues related to his lungs.  States he will be seeing a pulmonary specialist in the near future. Stress test on 04/09/2020 was considered  low risk.  Echocardiogram demonstrated LVEF of 55 to 60%.  Mild to moderate MR, mild AR, mild dilatation of ascending aorta measuring 41 mm, mild dilatation of aortic root measuring 40 mm.  CTA of the chest 04/29/2020 stated thoracic aorta was of normal caliber measuring 3.8 x 3.9 cm in greatest dimension.  This was likely stable since prior  examination and further follow-up was not required.   Past Medical History:  Diagnosis Date  . Anxiety   . Arthritis    "my back is loaded w/it" (01/06/2017)  . CAD (coronary artery disease)    80% ostial lesion in L circumflex (2017)  . Chronic lower back pain   . GERD (gastroesophageal reflux disease)   . Hiatal hernia   . High cholesterol   . Hypothyroidism   . IBS (irritable bowel syndrome)   . Numbness and tingling of right lower extremity    "since 11/30/2016" (01/06/2017)  . PMR (polymyalgia rheumatica) (HCC)   . Type 2 diabetes mellitus ([place])     Past Surgical History:  Procedure Laterality Date  . CARDIAC CATHETERIZATION  06/2016  . CORONARY ANGIOPLASTY WITH STENT PLACEMENT  01/06/2017  . CORONARY STENT INTERVENTION N/A 01/06/2017   Procedure: CORONARY STENT INTERVENTION;  Surgeon: Sherren Mocha, MD;  Location: [facility];  Service: Cardiovascular;  Laterality: N/A;  . FINGER SURGERY Left 1984   ring finger reattached.   . FOREARM FRACTURE SURGERY Right 2004   "has 3 rods and 22 screws in it"  . INGUINAL HERNIA REPAIR Left   . RIGHT/LEFT HEART CATH AND CORONARY ANGIOGRAPHY N/A 07/07/2016   Procedure: Right/Left Heart Cath and Coronary Angiography;  Surgeon: Jolaine Artist, MD;  Location: [facility];  Service: Cardiovascular;  Laterality: N/A;  . RIGHT/LEFT HEART CATH AND CORONARY ANGIOGRAPHY N/A 03/13/2018   Procedure: RIGHT/LEFT HEART CATH AND CORONARY ANGIOGRAPHY;  Surgeon: Jolaine Artist, MD;  Location: [facility];  Service: Cardiovascular;  Laterality: N/A;    Current Outpatient Medications  Medication Sig Dispense Refill  . ACCU-CHEK AVIVA PLUS test strip USE TO CHECK BLOOD SUGAR TWICE DAILY E11.9 100 strip 12  . Accu-Chek FastClix Lancets MISC USE TO CHECK BLOOD SUGAR TWICE DAILY E11.9 102 each 12  . acetaminophen (TYLENOL) 500 MG tablet Take 500-1,000 mg by mouth daily as needed for moderate pain or headache.    Marland Kitchen aspirin 81 MG  EC tablet Take 81 mg by mouth at bedtime.     . blood glucose meter kit and supplies Dispense based on patient and insurance preference. Use up to four times daily as directed. (FOR ICD-10 E10.9, E11.9). 1 each 0  . escitalopram (LEXAPRO) 10 MG tablet Take 1 tablet (10 mg total) by mouth daily. 30 tablet 3  . levothyroxine (SYNTHROID) 100 MCG tablet TAKE 1 TABLET BY MOUTH DAILY BEFORE BREAKFAST. 90 tablet 0  . mometasone (ELOCON) 0.1 % ointment Apply topically daily. 45 g 0  . Multiple Vitamin (MULTIVITAMIN WITH MINERALS) TABS tablet Take 1 tablet by mouth daily.    . nitroGLYCERIN (NITROSTAT) 0.4 MG SL tablet Place 1 tablet (0.4 mg total) under the tongue every 5 (five) minutes as needed for chest pain. 50 tablet 3  . rosuvastatin (CRESTOR) 20 MG tablet Take 1 tablet (20 mg total) by mouth daily. Last refill without office visit [phone] 90 tablet 0  . Semaglutide,0.25 or 0.5MG/DOS, (OZEMPIC, 0.25 OR 0.5 MG/DOSE,) 2 MG/1.5ML SOPN Inject 0.5 mg into the skin once a week. 1.5 mL  3  . vitamin B-12 (CYANOCOBALAMIN) 1000 MCG tablet Take 1,000 mcg by mouth daily.    . clopidogrel (PLAVIX) 75 MG tablet Take 1 tablet (75 mg total) by mouth daily. Needs appt for further refills 90 tablet 1   No current facility-administered medications for this visit.   Allergies:  Bee venom, Ceclor [cefaclor], Ciprofloxacin, Jardiance [empagliflozin], and Metformin and related   Social History: The patient  reports that he has never smoked. He has never used smokeless tobacco. He reports current alcohol use of about 2.0 standard drinks of alcohol per week. He reports that he does not use drugs.   Family History: The patient's family history includes Diabetes Mellitus II in his father; Heart attack in his mother; Heart failure in his father; Kidney failure in his father.   ROS:  Please see the history of present illness. Otherwise, complete review of systems is positive for none.  All other systems are reviewed and  negative.   Physical Exam: VS:  BP 118/68   Pulse 71   Ht _0  (1.753 m)   Wt 185 lb (83.9 kg)   SpO2 97%   BMI 27.32 kg/m , BMI Body mass index is 27.32 kg/m.  Wt Readings from Last 3 Encounters:  05/15/20 185 lb (83.9 kg)  05/14/20 185 lb (83.9 kg)  04/03/20 185 lb 9.6 oz (84.2 kg)    General: Patient appears comfortable at rest. Neck: Supple, no elevated JVP or carotid bruits, no thyromegaly. Lungs: Clear to auscultation, nonlabored breathing at rest. Cardiac: Regular rate and rhythm, no S3 or significant systolic murmur, no pericardial rub. Extremities: No pitting edema, distal pulses 2+. Skin: Warm and dry. Musculoskeletal: No kyphosis. Neuropsychiatric: Alert and oriented x3, affect grossly appropriate.  ECG:  An ECG dated 04/03/2020 was personally reviewed today and demonstrated:  Normal sinus rhythm rate of 86.  Recent Labwork: 02/20/2020: ALT 22; AST 20; Hemoglobin 14.9; Platelets 203 04/27/2020: BUN 20; Creatinine, Ser 1.23; Potassium 4.6; Sodium 137     Component Value Date/Time   CHOL 107 08/12/2019 1027   TRIG 103 08/12/2019 1027   HDL 40 08/12/2019 1027   CHOLHDL 2.7 08/12/2019 1027   VLDL 42 (H) 11/30/2015 1030   LDLCALC 48 08/12/2019 1027    Other Studies Reviewed Today:   CTA chest with contrast aorta 04/29/2020 IMPRESSION: The thoracic aorta is of normal caliber, better assessed on the current examination with administration of contrast, measuring 3.8 x 3.9 cm in greatest dimension. This is likely stable since prior examination and further follow-up is not required.  Progressive atrophy of the thyroid gland. Correlation with thyroid function test may be helpful  Mild coronary artery calcification   Nuclear stress test 04/09/2020  Narrative & Impression   No diagnostic ST segment changes to indicate ischemia.  Small, mild intensity, apical to basal inferolateral defect that is partially reversible mainly at the apex. This is consistent  with scar and mild peri-infarct ischemia.  This is a low risk study.  Nuclear stress EF: 62%.     Echocardiogram 04/14/2020 1. Left ventricular ejection fraction, by estimation, is 55 to 60%. The left ventricle has normal function. The left ventricle has no regional wall motion abnormalities. Left ventricular diastolic parameters are indeterminate. 2. Right ventricular systolic function is normal. The right ventricular size is normal. There is normal pulmonary artery systolic pressure. The estimated right ventricular systolic pressure is 70.9 mmHg. 3. The mitral valve is grossly normal. Mild to moderate mitral valve regurgitation. 4. The aortic  valve is tricuspid. Aortic valve regurgitation is mild. Aortic regurgitation PHT measures 528 msec. 5. Aortic dilatation noted. There is mild dilatation of the ascending aorta, measuring 41 mm. There is mild dilatation of the aortic root, measuring 40 mm. 6. The inferior vena cava is normal in size with greater than 50% respiratory variability, suggesting right atrial pressure of 3 mmHg. Comparison(s): Echocardiogram done 03/20/18 showed an EF of 55-60%.     Left and right heart cath 03/13/2018 RIGHT/LEFT HEART CATH AND CORONARY ANGIOGRAPHY  Conclusion    LM lesion is 20% stenosed.  Ramus lesion is 90% stenosed.  Prox RCA lesion is 40% stenosed.  Ost 1st Diag to 1st Diag lesion is 90% stenosed.  Prox LAD lesion is 30% stenosed.  Previously placed Ost Cx to Prox Cx stent (unknown type) is widely patent.  Ost LM to Mid LM lesion is 20% stenosed.   1. CAD with widely patent stent in ostial LCX 2. Unchanged 90% lesion in ostium of moderate-sized first diagonal 3. Otherwise mild non-obstructive CAD 4. EF 55-60% 6. Normal RHC numbers  LCX stent and hemodynamics look very good. Unchanged lesion in ostium of moderate-sized 1st diagonal. Cine films reviewed with interventional team who agreed diagonal lesion is best treated  medically.  Diagnostic Dominance: Right   Echocardiogram 03/20/2018  Study Conclusions   - Left ventricle: The cavity size was normal. Wall thickness was  increased in a pattern of mild LVH. Systolic function was normal.  The estimated ejection fraction was in the range of 55% to 60%.  Wall motion was normal; there were no regional wall motion  abnormalities. Doppler parameters are consistent with abnormal  left ventricular relaxation (grade 1 diastolic dysfunction).  - Aortic valve: There was mild regurgitation.  - Mitral valve: There was mild regurgitation  Cardiac cath 07/07/16   The left ventricular ejection fraction is 55-65% by visual estimate.  Prox RCA lesion, 40 %stenosed.  Ost Cx lesion, 90 %stenosed.  Ramus lesion, 90 %stenosed.  LM lesion, 20 %stenosed.  Mid LAD lesion, 20 %stenosed.  1st Diag lesion, 80 %stenosed. Findings: RA = 5 RV = 23/8 PA = 27/4 (16) PCW = 3 Fick cardiac output/index = 5.2/2.6 PVR = 2.5 WU  Ao sat =95% PA sat = 68%, 69%  1. 1-V CAD at ostium of LCX also involving ostium of ramus 2. Aneursymal proximal LAD with mild plaque 3. 40% Proximal RCA 4. Normal LVEF with normal hemodynamics  Echo 02/2016 LVEF 60-65%, Grade 2 DD, Mild AI, Trivial MR, Pa pear pressure 24 mm Hg. Normal RV.  High res Chest CT 03/13/2016 - No evidence of ILD. Mild patchy bibasilar scarring and volume loss. Incidental finding of AAA. + coronary artery calcification, + right adrenal myelolipoma.   PFTS 11/2015 FVC 2.75 (64%) FEV1 2.05 (65%) TLC 5.26 (77%) DLCO 20.70 (66%) -> corrects to 100% with alveolar volume.   CPX 05/06/16 Pre-Exercise PFTs  FVC 3.05 (74%)    FEV1 2.19 (70%)       FEV1/FVC 72 (94%)     MVV 86 (69%) Post-Exercise PFTs ((from lowest post-exercise trial (%change from rest)) FVC performed IPE, 5, 10, 15 mins FVC 3.19 (+6%) IPE    FEV1 2.44 (+11%)  10 mins     FEV1/FVC 73 (+1%)    Exercise  Time:  8:30      Watts: 120 RPE: 19 Reason stopped: Patient ended test due to dyspnea (9/10) and leg fatigue Additional symptoms: Back pain (7-8/10) with weight on left leg  Resting HR: 79 Peak HR: 135  (89% age predicted max HR) BP rest: 134/66 BP peak: 128/66 Peak VO2: 19.8 (86% predicted peak VO2) VE/VCO2 slope: 44 OUES: 1.46 Peak RER: 1.11 Ventilatory Threshold: 15.2 (66% predicted or measured peak VO2) Peak RR 53 Peak Ventilation: 88.8 VE/MVV: 103% PETCO2 at peak: 22 O2pulse: 12  (92% predicted O2pulse)    Assessment and Plan:   1. CAD in native artery/chest pain other  History of CAD with stents to left main and left circumflex.  Last cardiac catheterization:03/13/2018: LM 20%, ramus 90%, proximal RCA 40%, ostial first diagonal to first diagonal lesion 90%, proximal LAD lesion 30%.  Previously placed ostial LCx to proximal LCx widely patent, ostial LM to mid LM lesion 20% stenosed.  Stress test on 04/09/2020 was considered low risk.  Continue aspirin 81 mg daily, Plavix 75 mg daily, and sublingual nitroglycerin as needed for chest pain.  Please refill Plavix per patient request.  Patient states his Imdur was stopped by his primary care provider.   2. DOE (dyspnea on exertion)/shortness of breath  States he has had significant work-up on his lungs in the past for examination of pulmonary etiology for shortness of breath.  He states there is no evidence of pulmonary disease.  He has been exposed to agent orange in the past during the [place].  Last echocardiogram performed March 20, 2018 EF 50 to 55%.  Mild LVH, G1 DD, mild aortic and mitral regurgitation.   Echocardiogram 04/14/2020 demonstrated LVEF of 55 to 60%.  Mild to moderate MR, mild AR, mild dilatation of ascending aorta measuring 41 mm, mild dilatation of aortic root measuring 40 mm.  He continues to complain of shortness of breath.  He states he will see a pulmonary specialist soon to recheck his  lungs.  3. Mixed hyperlipidemia Continue Crestor 20 mg p.o. daily.  Most recent lipid panel 08/12/2019: TC 107, HDL 40, TG 103, LDL 48.  4. Type 2 diabetes mellitus without complication, without long-term current use of insulin (HCC) Recent hemoglobin A1c 7.5%.  Patient follows with PCP for management.  5. Ascending aortic aneurysm (HCC) High resolution CT chest 03/23/2016: Ascending aorta measures 4.3 cm. Annual with follow up CTA or MRI.  Recent echo cardiogram on 04/15/2019 showed mild dilatation of the aortic root 40 mm.  Mild dilatation of the ascending aorta measuring 41 mm.  Recent CT chest with contrast aorta showed thoracic aorta was normal in caliber measuring 3.8 x 3.9 cm in greatest dimension.  Medication Adjustments/Labs and Tests Ordered: Current medicines are reviewed at length with the patient today.  Concerns regarding medicines are outlined above.   Disposition: Follow-up with Dr. Harl Bowie or APP 6 months Signed, Levell July, NP 05/15/2020 2:34 PM    [facility], [place], [place] [address] Phone: [phone]; Fax: [phone]

## 2020-05-15 ENCOUNTER — Encounter: Payer: Self-pay | Admitting: Family Medicine

## 2020-05-15 ENCOUNTER — Telehealth: Payer: Self-pay

## 2020-05-15 ENCOUNTER — Ambulatory Visit (INDEPENDENT_AMBULATORY_CARE_PROVIDER_SITE_OTHER): Payer: Medicare Other | Admitting: Family Medicine

## 2020-05-15 VITALS — BP 118/68 | HR 71 | Ht 69.0 in | Wt 185.0 lb

## 2020-05-15 DIAGNOSIS — R0600 Dyspnea, unspecified: Secondary | ICD-10-CM

## 2020-05-15 DIAGNOSIS — E119 Type 2 diabetes mellitus without complications: Secondary | ICD-10-CM

## 2020-05-15 DIAGNOSIS — E782 Mixed hyperlipidemia: Secondary | ICD-10-CM | POA: Diagnosis not present

## 2020-05-15 DIAGNOSIS — R0609 Other forms of dyspnea: Secondary | ICD-10-CM

## 2020-05-15 DIAGNOSIS — I2510 Atherosclerotic heart disease of native coronary artery without angina pectoris: Secondary | ICD-10-CM

## 2020-05-15 MED ORDER — CLOPIDOGREL BISULFATE 75 MG PO TABS: 75.0000 mg | ORAL_TABLET | Freq: Every day | ORAL | 1 refills | Status: DC

## 2020-05-15 NOTE — Patient Instructions (Signed)
Your physician wants you to follow-up in: 6 MONTHS WITH MD You will receive a reminder letter in the mail two months in advance. If you don't receive a letter, please call our office to schedule the follow-up appointment.  Your physician recommends that you continue on your current medications as directed. Please refer to the Current Medication list given to you today.  Thank you for choosing [facility]!!    

## 2020-05-15 NOTE — Telephone Encounter (Signed)
Patient called for copy of Immunization record

## 2020-05-20 ENCOUNTER — Other Ambulatory Visit: Payer: Self-pay

## 2020-05-20 ENCOUNTER — Ambulatory Visit (INDEPENDENT_AMBULATORY_CARE_PROVIDER_SITE_OTHER): Payer: Medicare Other | Admitting: Clinical

## 2020-05-20 DIAGNOSIS — F4310 Post-traumatic stress disorder, unspecified: Secondary | ICD-10-CM | POA: Diagnosis not present

## 2020-05-20 NOTE — Progress Notes (Signed)
  Virtual Visit via Video Note  I connected withGustave Brandin Butler on 05/20/20 at 2:00 PM EST by a video enabled telemedicine application and verified that I am speaking with the correct person using two identifiers.  Location: Patient: Home Provider: Office  I discussed the limitations of evaluation and management by telemedicine and the availability of in person appointments. The patient expressed understanding and agreed to proceed.              THERAPY PROGRESS NOTE   Session Time:2:00PM-2:40PM  Participation Level:Active  Behavioral Response:CasualAlertAnxious  Type of Therapy:Individual Therapy  Treatment Goals addressed:Anxiety  Interventions:CBT, DBT, Solution Focused, Strength-based and Supportive  Summary:Caleb M. Lambertis [id number].o.malewho presents withPTSD.The OPT therapist worked with thepatientfor his ongoingtherapy. The OPT therapist utilized Motivational Interviewing to assist incontinuing to createtherapeutic repore. The patient in the session was engaged and work in collaboration giving feedback about his triggers and symptoms over the past few weeksincludingrescheduling the meeting with his son who he has been looking for over the past several years this was rescheduled for New Years Eve weekend due to the sons Mother passing away. The OPT therapist utilized Cognitive Behavioral Therapy through cognitive restructuring as well as worked with the patient on coping strategies to assist in management ofhis diagnosissymptoms.The patient idenitfied thathe does have positive thinking and things he is currently looking forward to including the rescheduled meeting with his son in the upcoming week as his son girlfriend and potentially grandson will be  traveling up to the patients home to stay for the weekend.  Suicidal/Homicidal:Nowithout intent/plan  Therapist Response:The OPT therapist worked with the patient for the patients  scheduled session. The patient was engaged in his session and gave feedback in relation to triggers, symptoms, and behavior responses over the pastfewweeks. The OPT therapist worked with the patient utilizing an in session Cognitive Behavioral Therapy exercise. The patient was responsive in the session and verbalized, "Iam still anxious and excited to finally be able to meet my son and potentially my grandson in person".The patient noted that hedid get a positive report from his doctor from his doctor. The patient spoke aboutbeing active andhis ongoingplans for work around his property.The OPT therapist will continue treatment work with the patient in his next scheduled session.  Plan: Return again2 weeks  Diagnosis:Axis I:PTSD Axis II:No diagnosis  I discussed the assessment and treatment plan with the patient. The patient was provided an opportunity to ask questions and all were answered. The patient agreed with the plan and demonstrated an understanding of the instructions.  The patient was advised to call back or seek an in-person evaluation if the symptoms worsen or if the condition fails to improve as anticipated.  I provided84minutes of non-face-to-face time during this encounter.  [name], LCSW

## 2020-05-26 ENCOUNTER — Encounter: Payer: Self-pay | Admitting: Cardiology

## 2020-05-26 LAB — PULMONARY FUNCTION TEST

## 2020-06-05 ENCOUNTER — Other Ambulatory Visit: Payer: Self-pay | Admitting: Family Medicine

## 2020-06-09 ENCOUNTER — Ambulatory Visit (INDEPENDENT_AMBULATORY_CARE_PROVIDER_SITE_OTHER): Payer: Medicare Other | Admitting: Clinical

## 2020-06-09 ENCOUNTER — Other Ambulatory Visit: Payer: Self-pay

## 2020-06-09 DIAGNOSIS — F4310 Post-traumatic stress disorder, unspecified: Secondary | ICD-10-CM

## 2020-06-09 NOTE — Progress Notes (Signed)
  Virtual Visit via Video Note  I connected withGustave Les Butler 1/11/22at 2:00 PM ESTby a video enabled telemedicine application and verified that I am speaking with the correct person using two identifiers.  Location: Patient:Home Provider:Office  I discussed the limitations of evaluation and management by telemedicine and the availability of in person appointments. The patient expressed understanding and agreed to proceed.  THERAPYPROGRESS NOTE  Session Time:2:00PM-2:30PM  Participation Level:Active  Behavioral Response:CasualAlertAnxious  Type of Therapy:Individual Therapy  Treatment Goals addressed:Anxiety  Interventions:CBT, DBT, Solution Focused, Strength-based and Supportive  Summary:Caleb Butler [id number].o.malewho presents withPTSD.The OPT therapist worked with thepatientfor his ongoingtherapy. The OPT therapist utilized Motivational Interviewing to assist incontinuing to createtherapeutic repore. 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 The OPT therapist utilized Cognitive Behavioral Therapy through cognitive restructuring as well as worked with the patient on coping strategies to assist in management ofhis diagnosissymptoms.The patient idenitfied thathe does have positive thinking and things he is currently looking forward to after getting to meet with his son and his fiance since the last session for the first time and planning for going to [place] in [place] to meet his grandson.  Suicidal/Homicidal:Nowithout intent/plan  Therapist Response:The OPT therapist worked with the patient for the patients scheduled session. The patient was engaged in his session and gave feedback in relation to triggers, symptoms, and behavior  responses over the pastfewweeks. The OPT therapist worked with the patient utilizing an in session Cognitive Behavioral Therapy exercise. The patient was responsive in the session and verbalized, "Iam still anxious and excited to finally be able to meet my grandson in person".The patient noted that heis requesting a Discharge as he has been consistent with his treatment and meeting his goals.The OPT therapist agreed and noted if the patient in the future needs ongoing services to reinitiate.  Plan: Successful Discharge  Diagnosis:Axis I:PTSD Axis II:No diagnosis  I discussed the assessment and treatment plan with the patient. The patient was provided an opportunity to ask questions and all were answered. The patient agreed with the plan and demonstrated an understanding of the instructions.  The patient was advised to call back or seek an in-person evaluation if the symptoms worsen or if the condition fails to improve as anticipated.  I provided1minutes of non-face-to-face time during this encounter.  [name], LCSW  06/09/20

## 2020-07-14 ENCOUNTER — Other Ambulatory Visit: Payer: Self-pay | Admitting: Family Medicine

## 2020-09-01 ENCOUNTER — Other Ambulatory Visit: Payer: Self-pay | Admitting: Family Medicine

## 2020-09-28 ENCOUNTER — Emergency Department (HOSPITAL_COMMUNITY)
Admission: EM | Admit: 2020-09-28 | Discharge: 2020-09-28 | Discharge status: Against Medical Advice | Payer: Medicare Other | Attending: Emergency Medicine | Admitting: Emergency Medicine

## 2020-09-28 ENCOUNTER — Other Ambulatory Visit: Payer: Self-pay

## 2020-09-28 ENCOUNTER — Emergency Department (HOSPITAL_COMMUNITY): Payer: Medicare Other

## 2020-09-28 ENCOUNTER — Encounter (HOSPITAL_COMMUNITY): Payer: Self-pay | Admitting: *Deleted

## 2020-09-28 DIAGNOSIS — E039 Hypothyroidism, unspecified: Secondary | ICD-10-CM | POA: Insufficient documentation

## 2020-09-28 DIAGNOSIS — E119 Type 2 diabetes mellitus without complications: Secondary | ICD-10-CM | POA: Diagnosis not present

## 2020-09-28 DIAGNOSIS — Z7902 Long term (current) use of antithrombotics/antiplatelets: Secondary | ICD-10-CM | POA: Diagnosis not present

## 2020-09-28 DIAGNOSIS — Y9373 Activity, racquet and hand sports: Secondary | ICD-10-CM | POA: Insufficient documentation

## 2020-09-28 DIAGNOSIS — W19XXXA Unspecified fall, initial encounter: Secondary | ICD-10-CM

## 2020-09-28 DIAGNOSIS — W01198A Fall on same level from slipping, tripping and stumbling with subsequent striking against other object, initial encounter: Secondary | ICD-10-CM | POA: Diagnosis not present

## 2020-09-28 DIAGNOSIS — I25119 Atherosclerotic heart disease of native coronary artery with unspecified angina pectoris: Secondary | ICD-10-CM | POA: Diagnosis not present

## 2020-09-28 DIAGNOSIS — Z79899 Other long term (current) drug therapy: Secondary | ICD-10-CM | POA: Insufficient documentation

## 2020-09-28 DIAGNOSIS — R0781 Pleurodynia: Secondary | ICD-10-CM | POA: Insufficient documentation

## 2020-09-28 DIAGNOSIS — Z794 Long term (current) use of insulin: Secondary | ICD-10-CM | POA: Diagnosis not present

## 2020-09-28 DIAGNOSIS — Z7982 Long term (current) use of aspirin: Secondary | ICD-10-CM | POA: Insufficient documentation

## 2020-09-28 MED ORDER — METHOCARBAMOL 500 MG PO TABS: 500.0000 mg | ORAL_TABLET | Freq: Two times a day (BID) | ORAL | 0 refills | Status: DC

## 2020-09-28 MED ORDER — OXYCODONE-ACETAMINOPHEN 5-325 MG PO TABS
1.0000 | ORAL_TABLET | Freq: Once | ORAL | Status: AC
  Administered 2020-09-28: 1 via ORAL
  Filled 2020-09-28: qty 1

## 2020-09-28 MED ORDER — METHOCARBAMOL 500 MG PO TABS
500.0000 mg | ORAL_TABLET | Freq: Once | ORAL | Status: AC
  Administered 2020-09-28: 500 mg via ORAL
  Filled 2020-09-28: qty 1

## 2020-09-28 NOTE — ED Provider Notes (Signed)
[facility] EMERGENCY DEPARTMENT Provider Note   CSN: [id number] Arrival date & time: 09/28/20  1625     History Chief Complaint  Patient presents with  . Fall    Caleb Butler is a 75 y.o. male with PMHx CAD, Arthritis, Diabetes, who presents to the ED today with complaint of gradual onset, constant, sharp, left rib pain s/p fall that occurred earlier today.  She reports he was playing pickle ball and was attempting to hit the ball when he tripped and fell and landed on his left side.  He does mention he hit his head however denies loss of consciousness.  Patient is on Plavix.  He states that his pain is exacerbated anytime he moves or takes a deep breath.  He has not taken anything for pain.  No other complaints at this time.   The history is provided by the patient and medical records.       Past Medical History:  Diagnosis Date  . Anxiety   . Arthritis    "my back is loaded w/it" (01/06/2017)  . CAD (coronary artery disease)    80% ostial lesion in L circumflex (2017)  . Chronic lower back pain   . GERD (gastroesophageal reflux disease)   . Hiatal hernia   . High cholesterol   . Hypothyroidism   . IBS (irritable bowel syndrome)   . Numbness and tingling of right lower extremity    "since 11/30/2016" (01/06/2017)  . PMR (polymyalgia rheumatica) (HCC)   . Type 2 diabetes mellitus (HCC)     Patient Active Problem List   Diagnosis Date Noted  . Coronary artery disease with exertional angina (HCC) 01/06/2017  . Coronary artery disease involving native coronary artery of native heart with angina pectoris (HCC)   . Anginal equivalent (HCC) 07/11/2016  . Exertional dyspnea 03/09/2016  . Abnormal PFT 03/09/2016  . OSTEOARTHRITIS, SHOULDER 08/03/2009  . IMPINGEMENT SYNDROME 07/13/2009    Past Surgical History:  Procedure Laterality Date  . CARDIAC CATHETERIZATION  06/2016  . CORONARY ANGIOPLASTY WITH STENT PLACEMENT  01/06/2017  . CORONARY STENT INTERVENTION N/A  01/06/2017   Procedure: CORONARY STENT INTERVENTION;  Surgeon: Cooper, Michael, MD;  Location: [facility];  Service: Cardiovascular;  Laterality: N/A;  . FINGER SURGERY Left 1984   ring finger reattached.   . FOREARM FRACTURE SURGERY Right 2004   "has 3 rods and 22 screws in it"  . INGUINAL HERNIA REPAIR Left   . RIGHT/LEFT HEART CATH AND CORONARY ANGIOGRAPHY N/A 07/07/2016   Procedure: Right/Left Heart Cath and Coronary Angiography;  Surgeon: Daniel R Bensimhon, MD;  Location: [facility];  Service: Cardiovascular;  Laterality: N/A;  . RIGHT/LEFT HEART CATH AND CORONARY ANGIOGRAPHY N/A 03/13/2018   Procedure: RIGHT/LEFT HEART CATH AND CORONARY ANGIOGRAPHY;  Surgeon: Bensimhon, Daniel R, MD;  Location: [facility];  Service: Cardiovascular;  Laterality: N/A;       Family History  Problem Relation Age of Onset  . Diabetes Mellitus II Father   . Heart failure Father   . Kidney failure Father   . Heart attack Mother     Social History   Tobacco Use  . Smoking status: Never Smoker  . Smokeless tobacco: Never Used  Vaping Use  . Vaping Use: Never used  Substance Use Topics  . Alcohol use: Yes    Alcohol/week: 2.0 standard drinks    Types: 1 Glasses of wine, 1 Cans of beer per week    Comment: 1   beer/week  . Drug use: No    Home Medications Prior to Admission medications   Medication Sig Start Date End Date Taking? Authorizing Provider  methocarbamol (ROBAXIN) 500 MG tablet Take 1 tablet (500 mg total) by mouth 2 (two) times daily. 09/28/20  Yes Suzie Vandam, PA-C  ACCU-CHEK AVIVA PLUS test strip USE TO CHECK BLOOD SUGAR TWICE DAILY E11.9 09/17/19   Susy Frizzle, MD  Accu-Chek FastClix Lancets MISC USE TO CHECK BLOOD SUGAR TWICE DAILY E11.9 02/24/20   Susy Frizzle, MD  acetaminophen (TYLENOL) 500 MG tablet Take 500-1,000 mg by mouth daily as needed for moderate pain or headache.    [provider]  aspirin 81 MG EC tablet Take 81 mg by mouth  at bedtime.     [provider]  blood glucose meter kit and supplies Dispense based on patient and insurance preference. Use up to four times daily as directed. (FOR ICD-10 E10.9, E11.9). 02/11/20   Susy Frizzle, MD  clopidogrel (PLAVIX) 75 MG tablet Take 1 tablet (75 mg total) by mouth daily. Needs appt for further refills 05/15/20   Verta Ellen., NP  escitalopram (LEXAPRO) 10 MG tablet TAKE 1 TABLET BY MOUTH EVERY DAY 06/05/20   Susy Frizzle, MD  levothyroxine (SYNTHROID) 100 MCG tablet TAKE 1 TABLET BY MOUTH EVERY DAY BEFORE BREAKFAST 09/01/20   Susy Frizzle, MD  mometasone (ELOCON) 0.1 % ointment Apply topically daily. 02/11/19   Susy Frizzle, MD  Multiple Vitamin (MULTIVITAMIN WITH MINERALS) TABS tablet Take 1 tablet by mouth daily.    [provider]  nitroGLYCERIN (NITROSTAT) 0.4 MG SL tablet PLACE 1 TABLET UNDER THE TONGUE EVERY 5 MINUTES AS NEEDED FOR CHEST PAIN 07/14/20   Susy Frizzle, MD  rosuvastatin (CRESTOR) 20 MG tablet Take 1 tablet (20 mg total) by mouth daily. Last refill without office visit [phone] 01/06/20   Bensimhon, Shaune Pascal, MD  Semaglutide,0.25 or 0.5MG/DOS, (OZEMPIC, 0.25 OR 0.5 MG/DOSE,) 2 MG/1.5ML SOPN Inject 0.5 mg into the skin once a week. 05/14/20   Susy Frizzle, MD  vitamin B-12 (CYANOCOBALAMIN) 1000 MCG tablet Take 1,000 mcg by mouth daily.    [provider]    Allergies    Bee venom, Ceclor [cefaclor], Ciprofloxacin, Jardiance [empagliflozin], and Metformin and related  Review of Systems   Review of Systems  Constitutional: Negative for chills and fever.  Eyes: Negative for visual disturbance.  Respiratory: Negative for shortness of breath.   Cardiovascular: Positive for chest pain (left rib pain).  Neurological: Negative for headaches.  All other systems reviewed and are negative.   Physical Exam Updated Vital Signs BP 130/74 (BP Location: Right Arm)   Pulse 62   Temp 97.7 F (36.5 C)  (Oral)   Resp 18   SpO2 100%   Physical Exam Vitals and nursing note reviewed.  Constitutional:      Appearance: He is not ill-appearing or diaphoretic.  HENT:     Head: Normocephalic and atraumatic.     Comments: No raccoon's sign or battle's sign.  Eyes:     Conjunctiva/sclera: Conjunctivae normal.  Cardiovascular:     Rate and Rhythm: Normal rate and regular rhythm.     Pulses: Normal pulses.  Pulmonary:     Effort: Pulmonary effort is normal.     Breath sounds: Normal breath sounds. No wheezing, rhonchi or rales.  Chest:     Chest wall: Tenderness present.       Comments: No  ecchymosis appreciated. + left lateral rib TTP; no crepitus.  Abdominal:     Palpations: Abdomen is soft.     Tenderness: There is no abdominal tenderness.  Musculoskeletal:     Cervical back: Neck supple.  Skin:    General: Skin is warm and dry.  Neurological:     Mental Status: He is alert.     ED Results / Procedures / Treatments   Labs (all labs ordered are listed, but only abnormal results are displayed) Labs Reviewed - No data to display  EKG EKG Interpretation  Date/Time:  Monday Sep 28 2020 17:06:00 EDT Ventricular Rate:  67 PR Interval:  182 QRS Duration: 98 QT Interval:  404 QTC Calculation: 426 R Axis:   50 Text Interpretation: Sinus rhythm with Premature atrial complexes Otherwise normal ECG Confirmed by Long, Joshua ([id number]) on 09/28/2020 5:31:16 PM   Radiology DG Ribs Unilateral W/Chest Left  Result Date: 09/28/2020 CLINICAL DATA:  Fell, left-sided rib pain EXAM: LEFT RIBS AND CHEST - 3+ VIEW COMPARISON:  11/03/2017 FINDINGS: Frontal view of the chest as well as frontal and oblique views of the left thoracic cage are obtained. Cardiac silhouette is unremarkable. No airspace disease, effusion, or pneumothorax. No acute displaced fractures. IMPRESSION: 1. No acute intrathoracic process.  No acute bony abnormalities. Electronically Signed   By: Michael  Brown M.D.   On:  09/28/2020 18:09    Procedures Procedures   Medications Ordered in ED Medications  oxyCODONE-acetaminophen (PERCOCET/ROXICET) 5-325 MG per tablet 1 tablet (has no administration in time range)  methocarbamol (ROBAXIN) tablet 500 mg (has no administration in time range)    ED Course  I have reviewed the triage vital signs and the nursing notes.  Pertinent labs & imaging results that were available during my care of the patient were reviewed by me and considered in my medical decision making (see chart for details).    MDM Rules/Calculators/A&P                          75-year-old male who presents to the ED today status post mechanical fall while playing pickle ball morning.  Landed on his left side and gradually began having left lateral chest wall pain.  He also hit his head however denies loss of consciousness.  Does take Plavix.  On arrival to the ED vitals are stable.  Patient had an EKG which did not show any acute ischemic changes.  He had a chest x-ray of his left ribs without any acute findings.  He was placed in vertical triage for medical screening exam.  Given his complaint of hitting his head I recommended a CT head given he is on Plavix however he declined.  He only wants to address his left rib pain at this time.  Patient will need to sign out AGAINST MEDICAL ADVICE and understands the risks of signing out before obtaining proper imaging as I cannot rule out an intracranial bleed given positive head injury.  Will provide pain medication and muscle relaxer in the ER and prescribe muscle relaxer in the outpatient setting.  Patient instructed to also apply Voltaren gel and can apply lidocaine patch as needed for pain with PCP follow-up.  He will be given incentive spirometer.  Instructed return to the ED for any worsening symptoms.   This note was prepared using Dragon voice recognition software and may include unintentional dictation errors due to the inherent limitations of voice  recognition software.  Final   Clinical Impression(s) / ED Diagnoses Final diagnoses:  Fall, initial encounter  Rib pain on left side    Rx / DC Orders ED Discharge Orders         Ordered    methocarbamol (ROBAXIN) 500 MG tablet  2 times daily        09/28/20 2008           Discharge Instructions     You have signed out against medical advice at this time without a CT scan of your head to assess for any intracranial abnormalities.   Your chest xray did not show any rib fractures at this time however you may have bruised your ribs from your fall. Please pick up medication and take as prescribed. DO NOT DRIVE WHILE ON THIS MEDICATION AS IT CAN MAKE YOU DROWSY. You can also pick up Voltaren Gel OTC to apply to this area as needed as well as Salon Pas patches (OTC lidocaine patches) to help with the pain.   Please use the incentive spirometer as indicated to increase your lung capacity and to prevent an infection from settling into your lungs.   Follow up with your PCP regarding your ED visit today.        Eustaquio Maize, PA-C 09/28/20 2016    Margette Fast, MD 09/29/20 1048

## 2020-09-28 NOTE — Discharge Instructions (Addendum)
You have signed out against medical advice at this time without a CT scan of your head to assess for any intracranial abnormalities.   Your chest xray did not show any rib fractures at this time however you may have bruised your ribs from your fall. Please pick up medication and take as prescribed. DO NOT DRIVE WHILE ON THIS MEDICATION AS IT CAN MAKE YOU DROWSY. You can also pick up Voltaren Gel OTC to apply to this area as needed as well as Salon Pas patches (OTC lidocaine patches) to help with the pain.   Please use the incentive spirometer as indicated to increase your lung capacity and to prevent an infection from settling into your lungs.   Follow up with your PCP regarding your ED visit today.

## 2020-09-28 NOTE — ED Triage Notes (Signed)
[place] [address] this am, pain in left rib cage

## 2020-09-29 ENCOUNTER — Ambulatory Visit: Payer: Medicare Other | Admitting: Family Medicine

## 2020-09-29 ENCOUNTER — Encounter: Payer: Self-pay | Admitting: Family Medicine

## 2020-09-29 VITALS — BP 120/66 | HR 84 | Temp 98.4°F | Resp 14 | Ht 69.0 in | Wt 185.0 lb

## 2020-09-29 DIAGNOSIS — R0781 Pleurodynia: Secondary | ICD-10-CM

## 2020-09-29 MED ORDER — OXYCODONE-ACETAMINOPHEN 5-325 MG PO TABS
1.0000 | ORAL_TABLET | ORAL | 0 refills | Status: DC | PRN
Start: 2020-09-29 — End: 2022-02-24

## 2020-09-29 NOTE — Progress Notes (Signed)
Subjective:    Patient ID: Caleb Butler, male    DOB: 07-22-1945, 75 y.o.   MRN: [id number] Patient fell yesterday while playing pickle ball with his wife.  He landed on his left ribs.  He now has pain in the mid axillary line around the level of the fifth and sixth rib.  He went to the emergency room where they got a x-ray of the ribs and showed no acute fracture.  However he is very tender to palpation today over the body of the rib in the midaxillary line.  He reports pleurisy.  He denies any shortness of breath or fever or cough. Past Medical History:  Diagnosis Date  . Anxiety   . Arthritis    "my back is loaded w/it" (01/06/2017)  . CAD (coronary artery disease)    80% ostial lesion in L circumflex (2017)  . Chronic lower back pain   . GERD (gastroesophageal reflux disease)   . Hiatal hernia   . High cholesterol   . Hypothyroidism   . IBS (irritable bowel syndrome)   . Numbness and tingling of right lower extremity    "since 11/30/2016" (01/06/2017)  . PMR (polymyalgia rheumatica) (HCC)   . Type 2 diabetes mellitus ([place])    Past Surgical History:  Procedure Laterality Date  . CARDIAC CATHETERIZATION  06/2016  . CORONARY ANGIOPLASTY WITH STENT PLACEMENT  01/06/2017  . CORONARY STENT INTERVENTION N/A 01/06/2017   Procedure: CORONARY STENT INTERVENTION;  Surgeon: Sherren Mocha, MD;  Location: [facility];  Service: Cardiovascular;  Laterality: N/A;  . FINGER SURGERY Left 1984   ring finger reattached.   . FOREARM FRACTURE SURGERY Right 2004   "has 3 rods and 22 screws in it"  . INGUINAL HERNIA REPAIR Left   . RIGHT/LEFT HEART CATH AND CORONARY ANGIOGRAPHY N/A 07/07/2016   Procedure: Right/Left Heart Cath and Coronary Angiography;  Surgeon: Jolaine Artist, MD;  Location: [facility];  Service: Cardiovascular;  Laterality: N/A;  . RIGHT/LEFT HEART CATH AND CORONARY ANGIOGRAPHY N/A 03/13/2018   Procedure: RIGHT/LEFT HEART CATH AND CORONARY ANGIOGRAPHY;   Surgeon: Jolaine Artist, MD;  Location: [facility];  Service: Cardiovascular;  Laterality: N/A;   Current Outpatient Medications on File Prior to Visit  Medication Sig Dispense Refill  . ACCU-CHEK AVIVA PLUS test strip USE TO CHECK BLOOD SUGAR TWICE DAILY E11.9 100 strip 12  . Accu-Chek FastClix Lancets MISC USE TO CHECK BLOOD SUGAR TWICE DAILY E11.9 102 each 12  . acetaminophen (TYLENOL) 500 MG tablet Take 500-1,000 mg by mouth daily as needed for moderate pain or headache.    Marland Kitchen aspirin 81 MG EC tablet Take 81 mg by mouth at bedtime.     . blood glucose meter kit and supplies Dispense based on patient and insurance preference. Use up to four times daily as directed. (FOR ICD-10 E10.9, E11.9). 1 each 0  . clopidogrel (PLAVIX) 75 MG tablet Take 1 tablet (75 mg total) by mouth daily. Needs appt for further refills 90 tablet 1  . escitalopram (LEXAPRO) 10 MG tablet TAKE 1 TABLET BY MOUTH EVERY DAY 90 tablet 2  . levothyroxine (SYNTHROID) 100 MCG tablet TAKE 1 TABLET BY MOUTH EVERY DAY BEFORE BREAKFAST 90 tablet 0  . methocarbamol (ROBAXIN) 500 MG tablet Take 1 tablet (500 mg total) by mouth 2 (two) times daily. 20 tablet 0  . mometasone (ELOCON) 0.1 % ointment Apply topically daily. 45 g 0  . Multiple Vitamin (MULTIVITAMIN WITH  MINERALS) TABS tablet Take 1 tablet by mouth daily.    . nitroGLYCERIN (NITROSTAT) 0.4 MG SL tablet PLACE 1 TABLET UNDER THE TONGUE EVERY 5 MINUTES AS NEEDED FOR CHEST PAIN 150 tablet 1  . rosuvastatin (CRESTOR) 20 MG tablet Take 1 tablet (20 mg total) by mouth daily. Last refill without office visit [phone] 90 tablet 0  . Semaglutide,0.25 or 0.5MG /DOS, (OZEMPIC, 0.25 OR 0.5 MG/DOSE,) 2 MG/1.5ML SOPN Inject 0.5 mg into the skin once a week. 1.5 mL 3  . vitamin B-12 (CYANOCOBALAMIN) 1000 MCG tablet Take 1,000 mcg by mouth daily.     No current facility-administered medications on file prior to visit.   Allergies  Allergen Reactions  . Bee Venom Swelling   . Ceclor [Cefaclor] Hives  . Ciprofloxacin Hives  . Jardiance [Empagliflozin]   . Metformin And Related Diarrhea    upsesomach   Social History   Socioeconomic History  . Marital status: Married    Spouse name: [name]  . Number of children: 0  . Years of education: 34  . Highest education level: Not on file  Occupational History    Comment: retired  Tobacco Use  . Smoking status: Never Smoker  . Smokeless tobacco: Never Used  Vaping Use  . Vaping Use: Never used  Substance and Sexual Activity  . Alcohol use: Yes    Alcohol/week: 2.0 standard drinks    Types: 1 Glasses of wine, 1 Cans of beer per week    Comment: 1 beer/week  . Drug use: No  . Sexual activity: Not Currently  Other Topics Concern  . Not on file  Social History Narrative   Retired. Lives with wife, [name].    Caffeine- coffee, 1 daily   Social Determinants of Health   Financial Resource Strain: Not on file  Food Insecurity: Not on file  Transportation Needs: Not on file  Physical Activity: Not on file  Stress: Not on file  Social Connections: Not on file  Intimate Partner Violence: Not on file   Family History  Problem Relation Age of Onset  . Diabetes Mellitus II Father   . Heart failure Father   . Kidney failure Father   . Heart attack Mother       Review of Systems  All other systems reviewed and are negative.      Objective:   Physical Exam Vitals reviewed.  Constitutional:      General: He is not in acute distress.    Appearance: He is well-developed. He is not diaphoretic.  HENT:     Head: Normocephalic and atraumatic.     Right Ear: External ear normal.     Left Ear: External ear normal.     Nose: Nose normal.     Mouth/Throat:     Pharynx: No oropharyngeal exudate.  Eyes:     General: No visual field deficit or scleral icterus.       Right eye: No discharge.        Left eye: No discharge.     Conjunctiva/sclera: Conjunctivae normal.     Pupils: Pupils are equal,  round, and reactive to light.  Neck:     Thyroid: No thyromegaly.     Vascular: No JVD.     Trachea: No tracheal deviation.  Cardiovascular:     Rate and Rhythm: Normal rate and regular rhythm.     Heart sounds: Normal heart sounds. No murmur heard. No friction rub. No gallop.   Pulmonary:     Effort:  Pulmonary effort is normal. No respiratory distress.     Breath sounds: Normal breath sounds. No stridor. No wheezing or rales.  Chest:     Chest wall: Tenderness present. No deformity or swelling.    Abdominal:     General: Bowel sounds are normal. There is no distension.     Palpations: Abdomen is soft. There is no mass.     Tenderness: There is abdominal tenderness. There is no guarding or rebound.    Musculoskeletal:        General: No tenderness.     Cervical back: Normal range of motion and neck supple.  Lymphadenopathy:     Cervical: No cervical adenopathy.  Skin:    General: Skin is warm.     Coloration: Skin is not pale.     Findings: No erythema or rash.  Neurological:     General: No focal deficit present.     Mental Status: He is alert and oriented to person, place, and time.     Cranial Nerves: Cranial nerves are intact. No cranial nerve deficit, dysarthria or facial asymmetry.     Sensory: Sensation is intact. No sensory deficit.     Motor: Motor function is intact. No weakness, tremor, atrophy, abnormal muscle tone, seizure activity or pronator drift.     Coordination: Coordination is intact. Romberg sign negative. Coordination normal. Finger-Nose-Finger Test and Heel to [facility] Test normal.     Gait: Gait is intact. Gait and tandem walk normal.     Deep Tendon Reflexes: Reflexes are normal and symmetric.           Assessment & Plan:  Rib pain on right side  I reviewed the ER notes.  I believe the patient may have cracked a rib or at least bruised rib.  I recommended deep breathing exercises to prevent atelectasis and pneumonia.  I recommended a rib belt for  comfort.  He can use Percocet 5/325 1 p.o. every 6 hours as needed pain.  Recheck immediately if he develops shortness of breath or fever.

## 2020-11-06 ENCOUNTER — Telehealth: Payer: Self-pay | Admitting: Family Medicine

## 2020-11-06 ENCOUNTER — Other Ambulatory Visit: Payer: Self-pay | Admitting: Family Medicine

## 2020-11-06 DIAGNOSIS — I2510 Atherosclerotic heart disease of native coronary artery without angina pectoris: Secondary | ICD-10-CM

## 2020-11-06 NOTE — Telephone Encounter (Signed)
Left message for patient to call back and schedule Medicare Annual Wellness Visit (AWV) in office.   If not able to come in office, please offer to do virtually or by telephone.   Last AWV: 07/28/2015  Please schedule at anytime with [facility]-Nurse Health Advisor.  If any questions, please contact me at [phone]

## 2020-11-11 NOTE — Progress Notes (Signed)
Cardiology Office Note  Date: 11/12/2020   ID: Caleb Butler, Caleb Butler 12-20-1945, MRN [id number]  PCP:  Susy Frizzle, MD  Cardiologist:  None Electrophysiologist:  None   Chief Complaint: Follow up DOE / CP  History of Present Illness: Caleb Butler is a 75 y.o. male with a history of  CAD, Arthritis, anxiety, chronic lower back pain,GERD, HLD, Hypothyroidism, DM2, staggering gait and falls.  Patient being referred back to cardiology for recent complaints of dyspnea and chest pain by PCP.  Patient has a previous history of coronary artery disease with stent placements.  See cardiac catheterization report below.  Previously seen by both Dr. Domenic Polite and [name].   He had longstanding history of progressive dyspnea on exertion/NYHA II-3.  RHC/LHC February 2018 demonstrating 90% ostial LCx lesion involving the ostium of the ramus.  40% proximal RCA lesion.  It was felt at the time this was not favorable to PCI but he continued to have symptoms and underwent PCI of ostial LCx and Cutting Balloon with DES on January 06, 2017.   Later seen in October 2019 by [name] for worsening chest pain and shortness of breath.  Repeat cath was reassuring.  He reported feeling better but still with DOE.  Recently seen by his primary care provider on 03/13/2020 with complaints of DOE and chest pain.  He was started on Imdur 30 mg daily.  PCP was concerned 90% ramus lesion or 80% diagonal lesion may have worsened because of chest pain, fatigue, worsening stamina/dyspnea.  Patient last here for follow-up 04/03/2020 with recent complaints of increased dyspnea and episodes of chest pain.  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  Had an episode the prior week with chest pain while driving.  Also had some associated shortness of breath with chest pain.  Had a pulmonary work-up which showed no evidence of a pulmonary cause of his dyspnea on  exertion.  His primary care provider was concerned he may have some advancing coronary artery disease explaining the dyspnea.  History of diabetes and  dyspnea was explained by the PCP to the patient that it could be an anginal equivalent.  Patient also has a history of ascending aortic aneurysm which last was checked on 03/23/2016 demonstrating an ascending aortic aneurysm of 4.3 cm.  There head been no follow-up diagnostics performed since that date. Two weeks prior to visit he was given Imdur by PCP which he stated seemed to alleviate the chest pain.  He  had no further chest pain since initiation of the medication. Last cardiac catheterization 03/13/2018 demonstrated widely patent stent in ostial LCx, patent stent in LM with 20% stenosis, unchanged 90% lesion and ostium of moderate sized first diagonal, ramus lesion otherwise mild nonobstructive CAD.   He is here for 78-monthfollow-up today.  He denies any recent acute illnesses or hospitalizations.  He did have a fall with some rib fractures on the left side and [place] but he is recovering from these injuries.  He denies any anginal or exertional symptoms, orthostatic symptoms, CVA or TIA-like symptoms, SOB or DOE.  Denies any PND or orthopnea.  No bleeding.  No claudication-like symptoms, DVT or PE-like symptoms.  He states he will be following up soon with his [place] for issues with his thyroid.   Past Medical History:  Diagnosis Date   Anxiety    Arthritis    "  my back is loaded w/it" (01/06/2017)   CAD (coronary artery disease)    80% ostial lesion in L circumflex (2017)   Chronic lower back pain    GERD (gastroesophageal reflux disease)    Hiatal hernia    High cholesterol    Hypothyroidism    IBS (irritable bowel syndrome)    Numbness and tingling of right lower extremity    "since 11/30/2016" (01/06/2017)   PMR (polymyalgia rheumatica) ([place])    Type 2 diabetes mellitus ([place])     Past  Surgical History:  Procedure Laterality Date   CARDIAC CATHETERIZATION  06/2016   CORONARY ANGIOPLASTY WITH STENT PLACEMENT  01/06/2017   CORONARY STENT INTERVENTION N/A 01/06/2017   Procedure: CORONARY STENT INTERVENTION;  Surgeon: Sherren Mocha, MD;  Location: [facility];  Service: Cardiovascular;  Laterality: N/A;   FINGER SURGERY Left 1984   ring finger reattached.    FOREARM FRACTURE SURGERY Right 2004   "has 3 rods and 22 screws in it"   INGUINAL HERNIA REPAIR Left    RIGHT/LEFT HEART CATH AND CORONARY ANGIOGRAPHY N/A 07/07/2016   Procedure: Right/Left Heart Cath and Coronary Angiography;  Surgeon: Jolaine Artist, MD;  Location: [facility];  Service: Cardiovascular;  Laterality: N/A;   RIGHT/LEFT HEART CATH AND CORONARY ANGIOGRAPHY N/A 03/13/2018   Procedure: RIGHT/LEFT HEART CATH AND CORONARY ANGIOGRAPHY;  Surgeon: Jolaine Artist, MD;  Location: [facility];  Service: Cardiovascular;  Laterality: N/A;    Current Outpatient Medications  Medication Sig Dispense Refill   ACCU-CHEK AVIVA PLUS test strip USE TO CHECK BLOOD SUGAR TWICE DAILY E11.9 100 strip 12   Accu-Chek FastClix Lancets MISC USE TO CHECK BLOOD SUGAR TWICE DAILY E11.9 102 each 12   acetaminophen (TYLENOL) 500 MG tablet Take 500-1,000 mg by mouth daily as needed for moderate pain or headache.     aspirin 81 MG EC tablet Take 81 mg by mouth at bedtime.      blood glucose meter kit and supplies Dispense based on patient and insurance preference. Use up to four times daily as directed. (FOR ICD-10 E10.9, E11.9). 1 each 0   Capsaicin 0.1 % CREA Apply topically as needed.     clopidogrel (PLAVIX) 75 MG tablet Take 1 tablet (75 mg total) by mouth daily. 90 tablet 2   escitalopram (LEXAPRO) 10 MG tablet TAKE 1 TABLET BY MOUTH EVERY DAY 90 tablet 2   levothyroxine (SYNTHROID) 100 MCG tablet TAKE 1 TABLET BY MOUTH EVERY DAY BEFORE BREAKFAST 90 tablet 0   methocarbamol (ROBAXIN) 500 MG tablet Take 1  tablet (500 mg total) by mouth 2 (two) times daily. 20 tablet 0   mometasone (ELOCON) 0.1 % ointment Apply topically daily. 45 g 0   Multiple Vitamin (MULTIVITAMIN WITH MINERALS) TABS tablet Take 1 tablet by mouth daily.     nitroGLYCERIN (NITROSTAT) 0.4 MG SL tablet PLACE 1 TABLET UNDER THE TONGUE EVERY 5 MINUTES AS NEEDED FOR CHEST PAIN 150 tablet 1   oxyCODONE-acetaminophen (PERCOCET) 5-325 MG tablet Take 1-2 tablets by mouth every 4 (four) hours as needed for severe pain. 30 tablet 0   rosuvastatin (CRESTOR) 20 MG tablet Take 1 tablet (20 mg total) by mouth daily. Last refill without office visit [phone] 90 tablet 0   Semaglutide,0.25 or 0.5MG /DOS, (OZEMPIC, 0.25 OR 0.5 MG/DOSE,) 2 MG/1.5ML SOPN Inject 0.5 mg into the skin once a week. 1.5 mL 3   vitamin B-12 (CYANOCOBALAMIN) 1000 MCG tablet Take 1,000 mcg by mouth  daily.     No current facility-administered medications for this visit.   Allergies:  Bee venom, Ceclor [cefaclor], Ciprofloxacin, Jardiance [empagliflozin], and Metformin and related   Social History: The patient  reports that he has never smoked. He has never used smokeless tobacco. He reports current alcohol use of about 2.0 standard drinks of alcohol per week. He reports that he does not use drugs.   Family History: The patient's family history includes Diabetes Mellitus II in his father; Heart attack in his mother; Heart failure in his father; Kidney failure in his father.   ROS:  Please see the history of present illness. Otherwise, complete review of systems is positive for none.  All other systems are reviewed and negative.   Physical Exam: VS:  BP 100/68   Pulse 75   Ht 5' 9.5" (1.765 m)   Wt 185 lb 9.6 oz (84.2 kg)   SpO2 96%   BMI 27.02 kg/m , BMI Body mass index is 27.02 kg/m.  Wt Readings from Last 3 Encounters:  11/12/20 185 lb 9.6 oz (84.2 kg)  09/29/20 185 lb (83.9 kg)  05/15/20 185 lb (83.9 kg)    General: Patient appears comfortable at  rest. Neck: Supple, no elevated JVP or carotid bruits, no thyromegaly. Lungs: Clear to auscultation, nonlabored breathing at rest. Cardiac: Regular rate and rhythm, no S3 or significant systolic murmur, no pericardial rub. Extremities: No pitting edema, distal pulses 2+. Skin: Warm and dry. Musculoskeletal: No kyphosis. Neuropsychiatric: Alert and oriented x3, affect grossly appropriate.  ECG:  An ECG dated 04/03/2020 was personally reviewed today and demonstrated:  Normal sinus rhythm rate of 86.  Recent Labwork: 02/20/2020: ALT 22; AST 20; Hemoglobin 14.9; Platelets 203 04/27/2020: BUN 20; Creatinine, Ser 1.23; Potassium 4.6; Sodium 137     Component Value Date/Time   CHOL 107 08/12/2019 1027   TRIG 103 08/12/2019 1027   HDL 40 08/12/2019 1027   CHOLHDL 2.7 08/12/2019 1027   VLDL 42 (H) 11/30/2015 1030   LDLCALC 48 08/12/2019 1027    Other Studies Reviewed Today:   CTA chest with contrast aorta 04/29/2020  IMPRESSION: The thoracic aorta is of normal caliber, better assessed on the current examination with administration of contrast, measuring 3.8 x 3.9 cm in greatest dimension. This is likely stable since prior examination and further follow-up is not required.   Progressive atrophy of the thyroid gland. Correlation with thyroid function test may be helpful   Mild coronary artery calcification   Nuclear stress test 04/09/2020  Narrative & Impression  No diagnostic ST segment changes to indicate ischemia. Small, mild intensity, apical to basal inferolateral defect that is partially reversible mainly at the apex. This is consistent with scar and mild peri-infarct ischemia. This is a low risk study. Nuclear stress EF: 62%.     Echocardiogram 04/14/2020 1. Left ventricular ejection fraction, by estimation, is 55 to 60%. The left ventricle has normal function. The left ventricle has no regional wall motion abnormalities. Left ventricular diastolic parameters are  indeterminate. 2. Right ventricular systolic function is normal. The right ventricular size is normal. There is normal pulmonary artery systolic pressure. The estimated right ventricular systolic pressure is 33.0 mmHg. 3. The mitral valve is grossly normal. Mild to moderate mitral valve regurgitation. 4. The aortic valve is tricuspid. Aortic valve regurgitation is mild. Aortic regurgitation PHT measures 528 msec. 5. Aortic dilatation noted. There is mild dilatation of the ascending aorta, measuring 41 mm. There is mild dilatation of the aortic root,  measuring 40 mm. 6. The inferior vena cava is normal in size with greater than 50% respiratory variability, suggesting right atrial pressure of 3 mmHg. Comparison(s): Echocardiogram done 03/20/18 showed an EF of 55-60%.     Left and right heart cath 03/13/2018 RIGHT/LEFT HEART CATH AND CORONARY ANGIOGRAPHY  Conclusion    LM lesion is 20% stenosed. Ramus lesion is 90% stenosed. Prox RCA lesion is 40% stenosed. Ost 1st Diag to 1st Diag lesion is 90% stenosed. Prox LAD lesion is 30% stenosed. Previously placed Ost Cx to Prox Cx stent (unknown type) is widely patent. Ost LM to Mid LM lesion is 20% stenosed.   1. CAD with widely patent stent in ostial LCX 2. Unchanged 90% lesion in ostium of moderate-sized first diagonal 3. Otherwise mild non-obstructive CAD 4. EF 55-60% 6. Normal RHC numbers  LCX stent and hemodynamics look very good. Unchanged lesion in ostium of moderate-sized 1st diagonal. Cine films reviewed with interventional team who agreed diagonal lesion is best treated medically.   Diagnostic Dominance: Right   Echocardiogram 03/20/2018  Study Conclusions   - Left ventricle: The cavity size was normal. Wall thickness was    increased in a pattern of mild LVH. Systolic function was normal.    The estimated ejection fraction was in the range of 55% to 60%.    Wall motion was normal; there were no regional wall motion     abnormalities. Doppler parameters are consistent with abnormal    left ventricular relaxation (grade 1 diastolic dysfunction).  - Aortic valve: There was mild regurgitation.  - Mitral valve: There was mild regurgitation   Cardiac cath 07/07/16   The left ventricular ejection fraction is 55-65% by visual estimate. Prox RCA lesion, 40 %stenosed. Ost Cx lesion, 90 %stenosed. Ramus lesion, 90 %stenosed. LM lesion, 20 %stenosed. Mid LAD lesion, 20 %stenosed. 1st Diag lesion, 80 %stenosed.  Findings:  RA = 5 RV = 23/8 PA = 27/4 (16) PCW = 3 Fick cardiac output/index = 5.2/2.6 PVR = 2.5 WU  Ao sat =95% PA sat = 68%, 69%   1. 1-V CAD at ostium of LCX also involving ostium of ramus 2. Aneursymal proximal LAD with mild plaque 3. 40% Proximal RCA 4. Normal LVEF with normal hemodynamics   Echo 02/2016 LVEF 60-65%, Grade 2 DD, Mild AI, Trivial MR, Pa pear pressure 24 mm Hg. Normal RV.   High res Chest CT 03/13/2016 - No evidence of ILD. Mild patchy bibasilar scarring and volume loss. Incidental finding of AAA. + coronary artery calcification, + right adrenal myelolipoma.    PFTS 11/2015 FVC 2.75 (64%) FEV1 2.05 (65%) TLC 5.26 (77%) DLCO 20.70 (66%) -> corrects to 100% with alveolar volume.    CPX 05/06/16 Pre-Exercise PFTs  FVC 3.05 (74%)      FEV1 2.19 (70%)           FEV1/FVC 72 (94%)        MVV 86 (69%) Post-Exercise PFTs ((from lowest post-exercise trial (%change from rest)) FVC performed IPE, 5, 10, 15 mins FVC 3.19 (+6%) IPE      FEV1 2.44 (+11%)   10 mins        FEV1/FVC 73 (+1%)      Exercise Time:    8:30            Watts: 120 RPE: 19 Reason stopped: Patient ended test due to dyspnea (9/10) and leg fatigue Additional symptoms: Back pain (7-8/10) with weight on left leg Resting HR: 79 Peak HR: 135   (  89% age predicted max HR) BP rest: 134/66 BP peak: 128/66 Peak VO2: 19.8 (86% predicted peak VO2) VE/VCO2 slope:  44 OUES: 1.46 Peak RER: 1.11 Ventilatory Threshold:  15.2 (66% predicted or measured peak VO2) Peak RR 53 Peak Ventilation:  88.8 VE/MVV:  103% PETCO2 at peak:  22 O2pulse:  12   (92% predicted O2pulse)     Assessment and Plan:   1. CAD in native artery/chest pain other  History of CAD with stents to left main and left circumflex.  Last cardiac catheterization:03/13/2018: LM 20%, ramus 90%, proximal RCA 40%, ostial first diagonal to first diagonal lesion 90%, proximal LAD lesion 30%.  Previously placed ostial LCx to proximal LCx widely patent, ostial LM to mid LM lesion 20% stenosed.  Stress test on 04/09/2020 was considered low risk.  Denies any current anginal or exertional symptoms.  Continue aspirin 81 mg daily, Plavix 75 mg daily, and sublingual nitroglycerin as needed for chest pain.    2. DOE (dyspnea on exertion)/shortness of breath  States he has had significant work-up on his lungs in the past for examination of pulmonary etiology for shortness of  He states there is no evidence of pulmonary disease.  He has been exposed to agent orange in the past during the [place] War.  Last echocardiogram performed March 20, 2018 EF 50 to 55%.  Mild LVH, G1 DD, mild aortic and mitral regurgitation.  Last visit he was supposed to be seeing a lung specialist.  He states his DOE is unchanged.  And does not significantly bothersome.  States it is chronic.   3. Mixed hyperlipidemia Continue Crestor 20 mg p.o. daily.  Most recent lipid panel 08/12/2019: TC 107, HDL 40, TG 103, LDL 48.  4. Type 2 diabetes mellitus without complication, without long-term current use of insulin (HCC) Recent hemoglobin A1c 7.5%.  Patient follows with PCP for management.  5. Ascending aortic aneurysm (HCC) High resolution CT chest 03/23/2016: Ascending aorta measures 4.3 cm. Annual with follow up CTA or MRI.  Recent echo cardiogram on 04/15/2019 showed mild dilatation of the aortic root 40 mm.  Mild dilatation of the ascending aorta measuring 41 mm.  Recent CT chest with  contrast aorta showed thoracic aorta was normal in caliber measuring 3.8 x 3.9 cm in greatest dimension.  Medication Adjustments/Labs and Tests Ordered: Current medicines are reviewed at length with the patient today.  Concerns regarding medicines are outlined above.   Disposition: Follow-up with Dr. Harl Bowie or APP 6 months Signed, Levell July, NP 11/12/2020 11:21 AM    [facility] [place], [place], [place] [address] Phone: [phone]; Fax: [phone]

## 2020-11-12 ENCOUNTER — Encounter: Payer: Self-pay | Admitting: Family Medicine

## 2020-11-12 ENCOUNTER — Ambulatory Visit: Payer: Medicare Other | Admitting: Family Medicine

## 2020-11-12 VITALS — BP 100/68 | HR 75 | Ht 69.5 in | Wt 185.6 lb

## 2020-11-12 DIAGNOSIS — E119 Type 2 diabetes mellitus without complications: Secondary | ICD-10-CM

## 2020-11-12 DIAGNOSIS — R0600 Dyspnea, unspecified: Secondary | ICD-10-CM | POA: Diagnosis not present

## 2020-11-12 DIAGNOSIS — I712 Thoracic aortic aneurysm, without rupture, unspecified: Secondary | ICD-10-CM

## 2020-11-12 DIAGNOSIS — E782 Mixed hyperlipidemia: Secondary | ICD-10-CM

## 2020-11-12 DIAGNOSIS — I2510 Atherosclerotic heart disease of native coronary artery without angina pectoris: Secondary | ICD-10-CM | POA: Diagnosis not present

## 2020-11-12 DIAGNOSIS — R0609 Other forms of dyspnea: Secondary | ICD-10-CM

## 2020-11-12 NOTE — Patient Instructions (Signed)
Medication Instructions:  Continue all current medications.   Labwork: none  Testing/Procedures: none  Follow-Up: 6 months   Any Other Special Instructions Will Be Listed Below (If Applicable).   If you need a refill on your cardiac medications before your next appointment, please call your pharmacy.  

## 2020-11-27 ENCOUNTER — Other Ambulatory Visit: Payer: Self-pay | Admitting: Family Medicine

## 2021-02-23 ENCOUNTER — Other Ambulatory Visit: Payer: Self-pay | Admitting: Family Medicine

## 2021-03-16 ENCOUNTER — Other Ambulatory Visit (HOSPITAL_COMMUNITY): Payer: Self-pay

## 2021-03-16 MED ORDER — INFLUENZA VAC A&B SA ADJ QUAD 0.5 ML IM PRSY
PREFILLED_SYRINGE | INTRAMUSCULAR | 0 refills | Status: DC
  Filled 2021-03-16: qty 0.5, 1d supply, fill #0

## 2021-03-17 ENCOUNTER — Other Ambulatory Visit (HOSPITAL_COMMUNITY): Payer: Self-pay

## 2021-04-15 ENCOUNTER — Telehealth: Payer: Self-pay | Admitting: Cardiology

## 2021-04-15 NOTE — Telephone Encounter (Signed)
-----   Message from Rowland Lathe, [facility] sent at 04/14/2021  9:07 AM EST ----- Regarding: [facility] CMS measure Hi Dr. Harl Bowie,   I am a [facility] pharmacist with statin quality initiative. Crestor has not been filled this year and patient confirmed he has not been taking Crestor 20 mg; last LDL 48. Per last cardiology note, it appears patient was supposed to be on Crestor. I just wanted to let you know prior to appointment in December.   Thank you for allowing pharmacy to be a part of this patient's care.  Kristeen Miss, PharmD Clinical Pharmacist [place] Cell: [phone]

## 2021-04-15 NOTE — Telephone Encounter (Signed)
Will address crestor at upcoming appt, has not filled from pharmacy  Zandra Abts MD

## 2021-04-26 ENCOUNTER — Other Ambulatory Visit: Payer: Self-pay

## 2021-04-26 ENCOUNTER — Ambulatory Visit: Payer: Medicare Other | Admitting: Family Medicine

## 2021-04-26 ENCOUNTER — Encounter: Payer: Self-pay | Admitting: Family Medicine

## 2021-04-26 VITALS — BP 108/62 | HR 79 | Temp 98.4°F | Resp 18 | Ht 69.5 in | Wt 184.0 lb

## 2021-04-26 DIAGNOSIS — R270 Ataxia, unspecified: Secondary | ICD-10-CM | POA: Diagnosis not present

## 2021-04-26 DIAGNOSIS — E039 Hypothyroidism, unspecified: Secondary | ICD-10-CM

## 2021-04-26 DIAGNOSIS — G4452 New daily persistent headache (NDPH): Secondary | ICD-10-CM | POA: Diagnosis not present

## 2021-04-26 MED ORDER — CLOPIDOGREL BISULFATE 75 MG PO TABS: 75.0000 mg | ORAL_TABLET | Freq: Every day | ORAL | 2 refills | Status: DC

## 2021-04-26 MED ORDER — LEVOTHYROXINE SODIUM 75 MCG PO TABS: 75.0000 ug | ORAL_TABLET | Freq: Every day | ORAL | 3 refills | Status: DC

## 2021-04-26 NOTE — Progress Notes (Signed)
Subjective:    Patient ID: Caleb Butler, male    DOB: Jun 13, 1945, 75 y.o.   MRN: [id number] Patient has 2 issues today.  First he has hypothyroidism.  He is currently on 100 mcg of levothyroxine daily.  Recently had lab work checked at the [place] and his TSH was 0.03.  He does report fatigue but he denies any weight loss or palpitations.  Second issue, 2 months ago he fell while playing pickle ball and struck his left forehead.  He was dazed and confused for a brief moment afterwards but kept playing pickle ball.  There was no loss of consciousness.  Ever since that time every morning he wakes up, "feeling like he has a hangover".  He reports a daily persistent headache in his right occiput.  He also reports dizziness.  He is staggering into walls and doorways.  He recently flipped his tractor on flat ground while trying to load hay into his barn.  He has pictures of this.  He states that the dizziness has been ever since he hit his head as well.  He is on Plavix Past Medical History:  Diagnosis Date   Anxiety    Arthritis    "my back is loaded w/it" (01/06/2017)   CAD (coronary artery disease)    80% ostial lesion in L circumflex (2017)   Chronic lower back pain    GERD (gastroesophageal reflux disease)    Hiatal hernia    High cholesterol    Hypothyroidism    IBS (irritable bowel syndrome)    Numbness and tingling of right lower extremity    "since 11/30/2016" (01/06/2017)   PMR (polymyalgia rheumatica) ([place])    Type 2 diabetes mellitus ([place])    Past Surgical History:  Procedure Laterality Date   CARDIAC CATHETERIZATION  06/2016   CORONARY ANGIOPLASTY WITH STENT PLACEMENT  01/06/2017   CORONARY STENT INTERVENTION N/A 01/06/2017   Procedure: CORONARY STENT INTERVENTION;  Surgeon: Sherren Mocha, MD;  Location: [facility];  Service: Cardiovascular;  Laterality: N/A;   FINGER SURGERY Left 1984   ring finger reattached.    FOREARM FRACTURE SURGERY Right 2004   "has 3 rods and  22 screws in it"   INGUINAL HERNIA REPAIR Left    RIGHT/LEFT HEART CATH AND CORONARY ANGIOGRAPHY N/A 07/07/2016   Procedure: Right/Left Heart Cath and Coronary Angiography;  Surgeon: Jolaine Artist, MD;  Location: [facility];  Service: Cardiovascular;  Laterality: N/A;   RIGHT/LEFT HEART CATH AND CORONARY ANGIOGRAPHY N/A 03/13/2018   Procedure: RIGHT/LEFT HEART CATH AND CORONARY ANGIOGRAPHY;  Surgeon: Jolaine Artist, MD;  Location: [facility];  Service: Cardiovascular;  Laterality: N/A;   Current Outpatient Medications on File Prior to Visit  Medication Sig Dispense Refill   ACCU-CHEK AVIVA PLUS test strip USE TO CHECK BLOOD SUGAR TWICE DAILY E11.9 100 strip 12   Accu-Chek FastClix Lancets MISC USE TO CHECK BLOOD SUGAR TWICE DAILY E11.9 102 each 12   acetaminophen (TYLENOL) 500 MG tablet Take 500-1,000 mg by mouth daily as needed for moderate pain or headache.     aspirin 81 MG EC tablet Take 81 mg by mouth at bedtime.      blood glucose meter kit and supplies Dispense based on patient and insurance preference. Use up to four times daily as directed. (FOR ICD-10 E10.9, E11.9). 1 each 0   Capsaicin 0.1 % CREA Apply topically as needed.     methocarbamol (ROBAXIN) 500 MG tablet Take  1 tablet (500 mg total) by mouth 2 (two) times daily. 20 tablet 0   mometasone (ELOCON) 0.1 % ointment Apply topically daily. 45 g 0   Multiple Vitamin (MULTIVITAMIN WITH MINERALS) TABS tablet Take 1 tablet by mouth daily.     nitroGLYCERIN (NITROSTAT) 0.4 MG SL tablet PLACE 1 TABLET UNDER THE TONGUE EVERY 5 MINUTES AS NEEDED FOR CHEST PAIN 150 tablet 1   oxyCODONE-acetaminophen (PERCOCET) 5-325 MG tablet Take 1-2 tablets by mouth every 4 (four) hours as needed for severe pain. 30 tablet 0   rosuvastatin (CRESTOR) 20 MG tablet Take 1 tablet (20 mg total) by mouth daily. Last refill without office visit [phone] 90 tablet 0   Semaglutide,0.25 or 0.5MG /DOS, (OZEMPIC, 0.25 OR 0.5 MG/DOSE,) 2  MG/1.5ML SOPN Inject 0.5 mg into the skin once a week. 1.5 mL 3   vitamin B-12 (CYANOCOBALAMIN) 1000 MCG tablet Take 1,000 mcg by mouth daily.     escitalopram (LEXAPRO) 10 MG tablet TAKE 1 TABLET BY MOUTH EVERY DAY (Patient not taking: Reported on 04/26/2021) 90 tablet 2   No current facility-administered medications on file prior to visit.   Allergies  Allergen Reactions   Bee Venom Swelling   Ceclor [Cefaclor] Hives   Ciprofloxacin Hives   Jardiance [Empagliflozin]    Metformin And Related Diarrhea    upsesomach   Social History   Socioeconomic History   Marital status: Married    Spouse name: [name]   Number of children: 0   Years of education: 12   Highest education level: Not on file  Occupational History    Comment: retired  Tobacco Use   Smoking status: Never   Smokeless tobacco: Never  Vaping Use   Vaping Use: Never used  Substance and Sexual Activity   Alcohol use: Yes    Alcohol/week: 2.0 standard drinks    Types: 1 Glasses of wine, 1 Cans of beer per week    Comment: 1 beer/week   Drug use: No   Sexual activity: Not Currently  Other Topics Concern   Not on file  Social History Narrative   Retired. Lives with wife, [name].    Caffeine- coffee, 1 daily   Social Determinants of Health   Financial Resource Strain: Not on file  Food Insecurity: Not on file  Transportation Needs: Not on file  Physical Activity: Not on file  Stress: Not on file  Social Connections: Not on file  Intimate Partner Violence: Not on file   Family History  Problem Relation Age of Onset   Diabetes Mellitus II Father    Heart failure Father    Kidney failure Father    Heart attack Mother       Review of Systems  All other systems reviewed and are negative.     Objective:   Physical Exam Vitals reviewed.  Constitutional:      General: He is not in acute distress.    Appearance: He is well-developed. He is not diaphoretic.  HENT:     Head: Normocephalic and  atraumatic.     Right Ear: External ear normal.     Left Ear: External ear normal.     Nose: Nose normal.     Mouth/Throat:     Pharynx: No oropharyngeal exudate.  Eyes:     General: No visual field deficit or scleral icterus.       Right eye: No discharge.        Left eye: No discharge.     Conjunctiva/sclera: Conjunctivae  normal.     Pupils: Pupils are equal, round, and reactive to light.  Neck:     Thyroid: No thyromegaly.     Vascular: No JVD.     Trachea: No tracheal deviation.  Cardiovascular:     Rate and Rhythm: Normal rate and regular rhythm.     Heart sounds: Normal heart sounds. No murmur heard.   No friction rub. No gallop.  Pulmonary:     Effort: Pulmonary effort is normal. No respiratory distress.     Breath sounds: Normal breath sounds. No stridor. No wheezing or rales.  Chest:     Chest wall: No tenderness.  Abdominal:     General: Bowel sounds are normal. There is no distension.     Palpations: Abdomen is soft. There is no mass.     Tenderness: There is no abdominal tenderness. There is no guarding or rebound.  Musculoskeletal:        General: No tenderness.     Cervical back: Normal range of motion and neck supple.  Lymphadenopathy:     Cervical: No cervical adenopathy.  Skin:    General: Skin is warm.     Coloration: Skin is not pale.     Findings: No erythema or rash.  Neurological:     General: No focal deficit present.     Mental Status: He is alert and oriented to person, place, and time.     Cranial Nerves: No cranial nerve deficit, dysarthria or facial asymmetry.     Sensory: Sensation is intact. No sensory deficit.     Motor: Motor function is intact. No weakness, tremor, atrophy, abnormal muscle tone, seizure activity or pronator drift.     Coordination: Coordination is intact. Romberg sign negative. Coordination normal. Finger-Nose-Finger Test and Heel to [facility] Test normal.     Gait: Gait is intact. Gait and tandem walk normal.     Deep Tendon  Reflexes: Reflexes are normal and symmetric.          Assessment & Plan:  New daily persistent headache - Plan: CT HEAD WO CONTRAST (5MM)  Hypothyroidism, unspecified type - Plan: clopidogrel (PLAVIX) 75 MG tablet  Ataxia I am concerned about the new daily persistent headache after the head trauma, his age, memory loss, he is on a blood thinner, and the ataxia.  Therefore I will start with a noncontrast CT scan to rule out a subdural hematoma.  Reduce levothyroxine to 75 mcg daily and recheck a TSH in 8 weeks

## 2021-04-30 ENCOUNTER — Ambulatory Visit: Payer: Medicare Other

## 2021-05-13 DIAGNOSIS — Z9189 Other specified personal risk factors, not elsewhere classified: Secondary | ICD-10-CM

## 2021-05-13 NOTE — Progress Notes (Signed)
[facility])                                            [place] Team                                        Statin Quality Measure Assessment    05/13/2021  Caleb Butler 11-02-1945 [id number]  Per review of chart and payor information, this patient has been flagged for non-adherence to the following CMS Quality Measure:   []  Statin Use in Persons with Diabetes  [x]  Statin Use in Persons with Cardiovascular Disease  The ASCVD Risk score (Arnett DK, et al., 2019) failed to calculate for the following reasons:   The valid total cholesterol range is 130 to 320 mg/dL  Rosuvastatin 20 mg daily on file yet he has not filled medication this year, per [name] and confirmed with patient. Dr. Harl Bowie was notified that patient has not been taking Crestor. LDL 48 (March 2021). June 2022 cardiology note indicated patient should continue Crestor. Although Crestor was last prescribed in August 2021 it is now expired. If deemed therapeutically appropriate, please consider renewing Crestor prescription and updated lipid panel. Next appointment with cardiology is on 05/14/2021.   Please consider ONE of the following recommendations:   Initiate high intensity statin Atorvastatin 40mg  once daily, #90, 3 refills   Rosuvastatin 20mg  once daily, #90, 3 refills    Initiate moderate intensity          statin with reduced frequency if prior          statin intolerance 1x weekly, #13, 3 refills   2x weekly, #26, 3 refills   3x weekly, #39, 3 refills    Code for past statin intolerance or other exclusions (required annually)  Drug Induced Myopathy G72.0   Myositis, unspecified M60.9   Rhabdomyolysis M62.82   Prediabetes R73.03   Adverse effect of antihyperlipidemic and antiarteriosclerotic drugs, initial encounter Q59.5G3O    Thank you for your time,  Kristeen Miss, [place] Cell: [phone]

## 2021-05-14 ENCOUNTER — Encounter: Payer: Self-pay | Admitting: *Deleted

## 2021-05-14 ENCOUNTER — Other Ambulatory Visit: Payer: Self-pay

## 2021-05-14 ENCOUNTER — Ambulatory Visit: Payer: Medicare Other | Admitting: Cardiology

## 2021-05-14 ENCOUNTER — Encounter: Payer: Self-pay | Admitting: Cardiology

## 2021-05-14 VITALS — BP 114/82 | HR 74 | Ht 69.0 in | Wt 187.4 lb

## 2021-05-14 DIAGNOSIS — R0609 Other forms of dyspnea: Secondary | ICD-10-CM

## 2021-05-14 DIAGNOSIS — I2510 Atherosclerotic heart disease of native coronary artery without angina pectoris: Secondary | ICD-10-CM | POA: Diagnosis not present

## 2021-05-14 NOTE — Patient Instructions (Addendum)

## 2021-05-14 NOTE — Progress Notes (Signed)
Clinical Summary Caleb Butler is a 75 y.o.male seen today for follow up of the following medical problems.   1.CAD - PCI to ostial LCX in 2018, given stent location indefinitite DAPT was recommended - relook caht 02/2018 with stable findings, does have residual 90% diag disease that recs were for medical management.  - 02/2018 echo LVEF 55-60%, no WMAs, grade I dd, mild MR, mild AI  - 03/2020 nuclear stress: apical scar with mild ischemia - 03/2020 echo LVEF 55-60%, no WMAs, indet diasotlic fxn, mild to mod MR, mild AI, ascending aorta 42mm  - no recent chest pains - compliant with meds. On indefinite DAPT    2. Chronic dyspnea - extensive cardiac and pulmonary workup in the past - symptoms overall unchanged.     3. Aortic dilatation - 04/2020 CTA: ascending aorta 3.9 cm - mild and stable, does not require follow up imaging.  Past Medical History:  Diagnosis Date   Anxiety    Arthritis    "my back is loaded w/it" (01/06/2017)   CAD (coronary artery disease)    80% ostial lesion in L circumflex (2017)   Chronic lower back pain    GERD (gastroesophageal reflux disease)    Hiatal hernia    High cholesterol    Hypothyroidism    IBS (irritable bowel syndrome)    Numbness and tingling of right lower extremity    "since 11/30/2016" (01/06/2017)   PMR (polymyalgia rheumatica) ([facility])    Type 2 diabetes mellitus ([facility])      Allergies  Allergen Reactions   Bee Venom Swelling   Ceclor [Cefaclor] Hives   Ciprofloxacin Hives   Jardiance [Empagliflozin]    Metformin And Related Diarrhea    upsesomach     Current Outpatient Medications  Medication Sig Dispense Refill   ACCU-CHEK AVIVA PLUS test strip USE TO CHECK BLOOD SUGAR TWICE DAILY E11.9 100 strip 12   Accu-Chek FastClix Lancets MISC USE TO CHECK BLOOD SUGAR TWICE DAILY E11.9 102 each 12   acetaminophen (TYLENOL) 500 MG tablet Take 500-1,000 mg by mouth daily as needed for moderate pain or headache.     aspirin 81  MG EC tablet Take 81 mg by mouth at bedtime.      blood glucose meter kit and supplies Dispense based on patient and insurance preference. Use up to four times daily as directed. (FOR ICD-10 E10.9, E11.9). 1 each 0   Capsaicin 0.1 % CREA Apply topically as needed.     clopidogrel (PLAVIX) 75 MG tablet Take 1 tablet (75 mg total) by mouth daily. 90 tablet 2   escitalopram (LEXAPRO) 10 MG tablet TAKE 1 TABLET BY MOUTH EVERY DAY (Patient not taking: Reported on 04/26/2021) 90 tablet 2   levothyroxine (SYNTHROID) 75 MCG tablet Take 1 tablet (75 mcg total) by mouth daily. 90 tablet 3   methocarbamol (ROBAXIN) 500 MG tablet Take 1 tablet (500 mg total) by mouth 2 (two) times daily. 20 tablet 0   mometasone (ELOCON) 0.1 % ointment Apply topically daily. 45 g 0   Multiple Vitamin (MULTIVITAMIN WITH MINERALS) TABS tablet Take 1 tablet by mouth daily.     nitroGLYCERIN (NITROSTAT) 0.4 MG SL tablet PLACE 1 TABLET UNDER THE TONGUE EVERY 5 MINUTES AS NEEDED FOR CHEST PAIN 150 tablet 1   oxyCODONE-acetaminophen (PERCOCET) 5-325 MG tablet Take 1-2 tablets by mouth every 4 (four) hours as needed for severe pain. 30 tablet 0   rosuvastatin (CRESTOR) 20 MG tablet Take 1 tablet (20  mg total) by mouth daily. Last refill without office visit [phone] 90 tablet 0   Semaglutide,0.25 or 0.5MG /DOS, (OZEMPIC, 0.25 OR 0.5 MG/DOSE,) 2 MG/1.5ML SOPN Inject 0.5 mg into the skin once a week. 1.5 mL 3   vitamin B-12 (CYANOCOBALAMIN) 1000 MCG tablet Take 1,000 mcg by mouth daily.     No current facility-administered medications for this visit.     Past Surgical History:  Procedure Laterality Date   CARDIAC CATHETERIZATION  06/2016   CORONARY ANGIOPLASTY WITH STENT PLACEMENT  01/06/2017   CORONARY STENT INTERVENTION N/A 01/06/2017   Procedure: CORONARY STENT INTERVENTION;  Surgeon: Sherren Mocha, MD;  Location: [facility];  Service: Cardiovascular;  Laterality: N/A;   FINGER SURGERY Left 1984   ring finger  reattached.    FOREARM FRACTURE SURGERY Right 2004   "has 3 rods and 22 screws in it"   INGUINAL HERNIA REPAIR Left    RIGHT/LEFT HEART CATH AND CORONARY ANGIOGRAPHY N/A 07/07/2016   Procedure: Right/Left Heart Cath and Coronary Angiography;  Surgeon: Jolaine Artist, MD;  Location: [facility];  Service: Cardiovascular;  Laterality: N/A;   RIGHT/LEFT HEART CATH AND CORONARY ANGIOGRAPHY N/A 03/13/2018   Procedure: RIGHT/LEFT HEART CATH AND CORONARY ANGIOGRAPHY;  Surgeon: Jolaine Artist, MD;  Location: [facility];  Service: Cardiovascular;  Laterality: N/A;     Allergies  Allergen Reactions   Bee Venom Swelling   Ceclor [Cefaclor] Hives   Ciprofloxacin Hives   Jardiance [Empagliflozin]    Metformin And Related Diarrhea    upsesomach      Family History  Problem Relation Age of Onset   Diabetes Mellitus II Father    Heart failure Father    Kidney failure Father    Heart attack Mother      Social History Mr. Daniello reports that he has never smoked. He has never used smokeless tobacco. Mr. Barich reports current alcohol use of about 2.0 standard drinks per week.   Review of Systems CONSTITUTIONAL: No weight loss, fever, chills, weakness or fatigue.  HEENT: Eyes: No visual loss, blurred vision, double vision or yellow sclerae.No hearing loss, sneezing, congestion, runny nose or sore throat.  SKIN: No rash or itching.  CARDIOVASCULAR: per hpi RESPIRATORY: No shortness of breath, cough or sputum.  GASTROINTESTINAL: No anorexia, nausea, vomiting or diarrhea. No abdominal pain or blood.  GENITOURINARY: No burning on urination, no polyuria NEUROLOGICAL: No headache, dizziness, syncope, paralysis, ataxia, numbness or tingling in the extremities. No change in bowel or bladder control.  MUSCULOSKELETAL: No muscle, back pain, joint pain or stiffness.  LYMPHATICS: No enlarged nodes. No history of splenectomy.  PSYCHIATRIC: No history of depression or anxiety.   ENDOCRINOLOGIC: No reports of sweating, cold or heat intolerance. No polyuria or polydipsia.  Marland Kitchen   Physical Examination Today's Vitals   05/14/21 1035  BP: 114/82  Pulse: 74  SpO2: 97%  Weight: 187 lb 6.4 oz (85 kg)  Height: 5\' 9"  (1.753 m)   Body mass index is 27.67 kg/m.  Gen: resting comfortably, no acute distress HEENT: no scleral icterus, pupils equal round and reactive, no palptable cervical adenopathy,  CV: RRR, no m/r/g no jvd Resp: Clear to auscultation bilaterally GI: abdomen is soft, non-tender, non-distended, normal bowel sounds, no hepatosplenomegaly MSK: extremities are warm, no edema.  Skin: warm, no rash Neuro:  no focal deficits Psych: appropriate affect   Diagnostic Studies  06/2016 cath The left ventricular ejection fraction is 55-65% by visual estimate. Prox RCA  lesion, 40 %stenosed. Ost Cx lesion, 90 %stenosed. Ramus lesion, 90 %stenosed. LM lesion, 20 %stenosed. Mid LAD lesion, 20 %stenosed. 1st Diag lesion, 80 %stenosed.   Findings:   RA = 5 RV = 23/8 PA = 27/4 (16) PCW = 3 Fick cardiac output/index = 5.2/2.6 PVR = 2.5 WU  Ao sat =95% PA sat = 68%, 69%   Assessment: 1. 1-V CAD at ostium of LCX alsi involving ostium of ramus 2. Aneursymal proximal LAD with mild plaque 3. 40% Proximal RCA 4. Normal LVEF with normal hemodynamics   Plan/Discussion:   Case reviewed with Dr. Burt Knack. He has high grade ostial lesion in ostium of LCX also involving Ramus Takari Lundahl. It is a moderate-sized system which is not completely favorable to PCI. Will proceed with ETT/Myvoview. If high ischemic burden will plan angioplasty +/- stenting. Otherwise proceed with aggressive medical therapy.    12/2016 PCI Successful PCI of severe ostial stenosis in the left circumflex, reducing the stenosis from 90% to 0% with Cut[place] Balloon angioplasty followed by drug-eluting stent implantation.   Recommendations: Aspirin and Plavix for a minimum of 12 months. If tolerated  favor long-term DAPT as the stent is positioned at the left main bifurcation.   02/2018 RHC/LHC LM lesion is 20% stenosed. Ramus lesion is 90% stenosed. Prox RCA lesion is 40% stenosed. Ost 1st Diag to 1st Diag lesion is 90% stenosed. Prox LAD lesion is 30% stenosed. Previously placed Ost Cx to Prox Cx stent (unknown type) is widely patent. Ost LM to Mid LM lesion is 20% stenosed.     Findings:   RA = 7 RV = 32/9 PA = 33/10 (20) PCW = 10 Fick cardiac output/index = 5.4/2.7 PVR = 1.9 WU Ao sat = 93% PA sat = 64%, 68%   Assessment: 1. CAD with widely patent stent in ostial LCX 2. Unchanged 90% lesion in ostium of moderate-sized first diagonal 3. Otherwise mild non-obstructive CAD 4. EF 55-60% 6. Normal RHC numbers   02/2018 echo Study Conclusions   - Left ventricle: The cavity size was normal. Wall thickness was    increased in a pattern of mild LVH. Systolic function was normal.    The estimated ejection fraction was in the range of 55% to 60%.    Wall motion was normal; there were no regional wall motion    abnormalities. Doppler parameters are consistent with abnormal    left ventricular relaxation (grade 1 diastolic dysfunction).  - Aortic valve: There was mild regurgitation.  - Mitral valve: There was mild regurgitation.   03/2020 nuclear stress No diagnostic ST segment changes to indicate ischemia. Small, mild intensity, apical to basal inferolateral defect that is partially reversible mainly at the apex. This is consistent with scar and mild peri-infarct ischemia. This is a low risk study. Nuclear stress EF: 62%.  03/2020 echo IMPRESSIONS     1. Left ventricular ejection fraction, by estimation, is 55 to 60%. The  left ventricle has normal function. The left ventricle has no regional  wall motion abnormalities. Left ventricular diastolic parameters are  indeterminate.   2. Right ventricular systolic function is normal. The right ventricular  size is  normal. There is normal pulmonary artery systolic pressure. The  estimated right ventricular systolic pressure is 58.5 mmHg.   3. The mitral valve is grossly normal. Mild to moderate mitral valve  regurgitation.   4. The aortic valve is tricuspid. Aortic valve regurgitation is mild.  Aortic regurgitation PHT measures 528 msec.   5. Aortic  dilatation noted. There is mild dilatation of the ascending  aorta, measuring 41 mm. There is mild dilatation of the aortic root,  measuring 40 mm.   6. The inferior vena cava is normal in size with greater than 50%  respiratory variability, suggesting right atrial pressure of 3 mmHg.   Assessment and Plan   CAD - no recent symptoms, continue current meds - he is on indefinite DAPT due to location of prior stent  2. DOE - chronic stable symptos, recent cardiac symptoms has been benign       Arnoldo Lenis, M.D

## 2021-05-17 NOTE — Progress Notes (Signed)
Can we refill his crestor please   J Titianna Loomis MD

## 2021-05-18 ENCOUNTER — Ambulatory Visit
Admission: RE | Admit: 2021-05-18 | Discharge: 2021-05-18 | Disposition: A | Discharge status: Home / Self Care | Payer: Medicare Other | Source: Ambulatory Visit | Attending: Family Medicine | Admitting: Family Medicine

## 2021-05-18 ENCOUNTER — Other Ambulatory Visit: Payer: Self-pay

## 2021-05-18 ENCOUNTER — Other Ambulatory Visit: Payer: Self-pay | Admitting: *Deleted

## 2021-05-18 DIAGNOSIS — G4452 New daily persistent headache (NDPH): Secondary | ICD-10-CM

## 2021-05-18 MED ORDER — ROSUVASTATIN CALCIUM 20 MG PO TABS
20.0000 mg | ORAL_TABLET | Freq: Every day | ORAL | 3 refills | Status: DC
Start: 2021-05-18 — End: 2022-05-13

## 2021-05-18 NOTE — Telephone Encounter (Signed)
Patient informed via voicemail that crestor refill sent to [facility]

## 2021-05-18 NOTE — Telephone Encounter (Signed)
Can we refill his crestor please     J BrancH MD

## 2021-05-20 ENCOUNTER — Ambulatory Visit (INDEPENDENT_AMBULATORY_CARE_PROVIDER_SITE_OTHER): Payer: Medicare Other

## 2021-05-20 ENCOUNTER — Other Ambulatory Visit: Payer: Self-pay

## 2021-05-20 VITALS — Ht 69.0 in | Wt 185.0 lb

## 2021-05-20 DIAGNOSIS — M25519 Pain in unspecified shoulder: Secondary | ICD-10-CM | POA: Insufficient documentation

## 2021-05-20 DIAGNOSIS — M25549 Pain in joints of unspecified hand: Secondary | ICD-10-CM | POA: Insufficient documentation

## 2021-05-20 DIAGNOSIS — M79642 Pain in left hand: Secondary | ICD-10-CM | POA: Insufficient documentation

## 2021-05-20 DIAGNOSIS — Z23 Encounter for immunization: Secondary | ICD-10-CM | POA: Insufficient documentation

## 2021-05-20 DIAGNOSIS — Z0000 Encounter for general adult medical examination without abnormal findings: Secondary | ICD-10-CM

## 2021-05-20 DIAGNOSIS — G4730 Sleep apnea, unspecified: Secondary | ICD-10-CM | POA: Insufficient documentation

## 2021-05-20 NOTE — Progress Notes (Signed)
Subjective:   Caleb Butler is a 75 y.o. male who presents for Medicare Annual/Subsequent preventive examination. Virtual Visit via Telephone Note  I connected with  Caleb Butler on 05/20/21 at  2:00 PM EST by telephone and verified that I am speaking with the correct person using two identifiers.  Location: Patient: Home Provider: [facility] Persons participating in the virtual visit: patient/Nurse Health Advisor   I discussed the limitations, risks, security and privacy concerns of performing an evaluation and management service by telephone and the availability of in person appointments. The patient expressed understanding and agreed to proceed.  Interactive audio and video telecommunications were attempted between this nurse and patient, however failed, due to patient having technical difficulties OR patient did not have access to video capability.  We continued and completed visit with audio only.  Some vital signs may be absent or patient reported.   Chriss Driver, LPN  Review of Systems     Cardiac Risk Factors include: advanced age (>20mn, >>98women);diabetes mellitus;male gender;sedentary lifestyle     Objective:    Today's Vitals   05/20/21 1356 05/20/21 1358  Weight: 185 lb (83.9 kg)   Height: _0  (1.753 m)   PainSc:  5    Body mass index is 27.32 kg/m.  Advanced Directives 05/20/2021 03/13/2018 03/09/2017 02/13/2017 01/26/2017 01/06/2017 09/06/2016  Does Patient Have a Medical Advance Directive? _1  No No  Would patient like information on creating a medical advance directive? No - Patient declined No - Patient declined No - Patient declined No - Patient declined - Yes (MAU/Ambulatory/Procedural Areas - Information given) No - Patient declined    Current Medications (verified) Outpatient Encounter Medications as of 05/20/2021  Medication Sig   ACCU-CHEK AVIVA PLUS test strip USE TO CHECK BLOOD SUGAR TWICE DAILY E11.9    Accu-Chek FastClix Lancets MISC USE TO CHECK BLOOD SUGAR TWICE DAILY E11.9   acetaminophen (TYLENOL) 500 MG tablet Take 500-1,000 mg by mouth daily as needed for moderate pain or headache.   aspirin 81 MG EC tablet Take 81 mg by mouth at bedtime.    blood glucose meter kit and supplies Dispense based on patient and insurance preference. Use up to four times daily as directed. (FOR ICD-10 E10.9, E11.9).   Capsaicin 0.1 % CREA Apply topically as needed.   clopidogrel (PLAVIX) 75 MG tablet Take 1 tablet (75 mg total) by mouth daily.   escitalopram (LEXAPRO) 10 MG tablet TAKE 1 TABLET BY MOUTH EVERY DAY   folic acid (FOLVITE) 1 MG tablet Take 1 mg by mouth daily.   levothyroxine (SYNTHROID) 75 MCG tablet Take 1 tablet (75 mcg total) by mouth daily.   methocarbamol (ROBAXIN) 500 MG tablet Take 1 tablet (500 mg total) by mouth 2 (two) times daily.   methotrexate (RHEUMATREX) 2.5 MG tablet Take 12.5 mg by mouth once a week. Caution:Chemotherapy. Protect from light.   mometasone (ELOCON) 0.1 % ointment Apply topically daily.   Multiple Vitamin (MULTIVITAMIN WITH MINERALS) TABS tablet Take 1 tablet by mouth daily.   nitroGLYCERIN (NITROSTAT) 0.4 MG SL tablet PLACE 1 TABLET UNDER THE TONGUE EVERY 5 MINUTES AS NEEDED FOR CHEST PAIN   oxyCODONE-acetaminophen (PERCOCET) 5-325 MG tablet Take 1-2 tablets by mouth every 4 (four) hours as needed for severe pain.   rosuvastatin (CRESTOR) 20 MG tablet Take 1 tablet (20 mg total) by mouth daily.   Semaglutide,0.25 or 0.5MG/DOS, (OZEMPIC, 0.25 OR 0.5 MG/DOSE,) 2 MG/1.5ML SOPN Inject 0.5  mg into the skin once a week.   vitamin B-12 (CYANOCOBALAMIN) 1000 MCG tablet Take 1,000 mcg by mouth daily.   No facility-administered encounter medications on file as of 05/20/2021.    Allergies (verified) Bee venom, Ceclor [cefaclor], Ciprofloxacin, Jardiance [empagliflozin], and Metformin and related   History: Past Medical History:  Diagnosis Date   Anxiety    Arthritis     "my back is loaded w/it" (01/06/2017)   CAD (coronary artery disease)    80% ostial lesion in L circumflex (2017)   Chronic lower back pain    GERD (gastroesophageal reflux disease)    Hiatal hernia    High cholesterol    Hypothyroidism    IBS (irritable bowel syndrome)    Numbness and tingling of right lower extremity    "since 11/30/2016" (01/06/2017)   PMR (polymyalgia rheumatica) ([place])    Type 2 diabetes mellitus ([place])    Past Surgical History:  Procedure Laterality Date   CARDIAC CATHETERIZATION  06/2016   CORONARY ANGIOPLASTY WITH STENT PLACEMENT  01/06/2017   CORONARY STENT INTERVENTION N/A 01/06/2017   Procedure: CORONARY STENT INTERVENTION;  Surgeon: Sherren Mocha, MD;  Location: [facility];  Service: Cardiovascular;  Laterality: N/A;   FINGER SURGERY Left 1984   ring finger reattached.    FOREARM FRACTURE SURGERY Right 2004   "has 3 rods and 22 screws in it"   INGUINAL HERNIA REPAIR Left    RIGHT/LEFT HEART CATH AND CORONARY ANGIOGRAPHY N/A 07/07/2016   Procedure: Right/Left Heart Cath and Coronary Angiography;  Surgeon: Jolaine Artist, MD;  Location: [facility];  Service: Cardiovascular;  Laterality: N/A;   RIGHT/LEFT HEART CATH AND CORONARY ANGIOGRAPHY N/A 03/13/2018   Procedure: RIGHT/LEFT HEART CATH AND CORONARY ANGIOGRAPHY;  Surgeon: Jolaine Artist, MD;  Location: [facility];  Service: Cardiovascular;  Laterality: N/A;   Family History  Problem Relation Age of Onset   Diabetes Mellitus II Father    Heart failure Father    Kidney failure Father    Heart attack Mother    Social History   Socioeconomic History   Marital status: Married    Spouse name: Caleb Butler   Number of children: 0   Years of education: 12   Highest education level: Not on file  Occupational History    Comment: retired  Tobacco Use   Smoking status: Never   Smokeless tobacco: Never  Vaping Use   Vaping Use: Never used  Substance and Sexual Activity    Alcohol use: Yes    Alcohol/week: 2.0 standard drinks    Types: 1 Glasses of wine, 1 Cans of beer per week    Comment: 1 beer/week   Drug use: No   Sexual activity: Not Currently  Other Topics Concern   Not on file  Social History Narrative   Retired. Lives with wife, Caleb Butler.    Caffeine- coffee, 1 daily   Social Determinants of Health   Financial Resource Strain: Low Risk    Difficulty of Paying Living Expenses: Not hard at all  Food Insecurity: No Food Insecurity   Worried About Charity fundraiser in the Last Year: Never true   Ran Out of Food in the Last Year: Never true  Transportation Needs: No Transportation Needs   Lack of Transportation (Medical): No   Lack of Transportation (Non-Medical): No  Physical Activity: Insufficiently Active   Days of Exercise per Week: 5 days   Minutes of Exercise per Session: 20 min  Stress: No Stress Concern Present   Feeling of Stress : Only a little  Social Connections: Engineer, building services of Communication with Friends and Family: More than three times a week   Frequency of Social Gatherings with Friends and Family: More than three times a week   Attends Religious Services: More than 4 times per year   Active Member of [employer] or Organizations: Yes   Attends Music therapist: More than 4 times per year   Marital Status: Married    Tobacco Counseling Counseling given: Not Answered   Clinical Intake:  Pre-visit preparation completed: Yes  Pain : 0-10 Pain Score: 5  Pain Type: Chronic pain Pain Location: Back Pain Descriptors / Indicators: Aching Pain Onset: More than a month ago Pain Frequency: Intermittent     BMI - recorded: 27.32 Nutritional Status: BMI 25 -29 Overweight Nutritional Risks: None Diabetes: Yes  How often do you need to have someone help you when you read instructions, pamphlets, or other written materials from your doctor or pharmacy?: 1 - Never  Diabetic?Nutrition Risk  Assessment:  Has the patient had any N/V/D within the last 2 months?  No  Does the patient have any non-healing wounds?  No  Has the patient had any unintentional weight loss or weight gain?  No   Diabetes:  Is the patient diabetic?  Yes  If diabetic, was a CBG obtained today?  No  Did the patient bring in their glucometer from home?  No  Phone visit. How often do you monitor your CBG's? Irregularly.   Financial Strains and Diabetes Management:  Are you having any financial strains with the device, your supplies or your medication? No .  Does the patient want to be seen by Chronic Care Management for management of their diabetes?  No  Would the patient like to be referred to a Nutritionist or for Diabetic Management?  No   Diabetic Exams:  Diabetic Eye Exam: Completed 08/20/2020. Overdue for diabetic eye exam. Pt has been advised about the importance in completing this exam.  Diabetic Foot Exam: Completed 08/12/2019. Pt has been advised about the importance in completing this exam.   Interpreter Needed?: No  Information entered by :: Caleb Traeger Sultana,lpn   Activities of Daily Living In your present state of health, do you have any difficulty performing the following activities: 05/20/2021  Hearing? N  Vision? N  Difficulty concentrating or making decisions? Y  Walking or climbing stairs? N  Dressing or bathing? N  Doing errands, shopping? N  Preparing Food and eating ? N  Using the Toilet? N  In the past six months, have you accidently leaked urine? N  Do you have problems with loss of bowel control? N  Managing your Medications? N  Managing your Finances? N  Housekeeping or managing your Housekeeping? N  Some recent data might be hidden    Patient Care Team: Susy Frizzle, MD as PCP - General (Family Medicine) Harl Bowie, Alphonse Guild, MD as PCP - Cardiology (Cardiology) Clinic, [place] any recent Medical Services you may have received from other than [facility]  providers in the past year (date may be approximate).     Assessment:   This is a routine wellness examination for Bib[place].  Hearing/Vision screen Hearing Screening - Comments:: Some hearing issues. Vision Screening - Comments:: Glasses. 08/20/2020. [facility] [place].  Dietary issues and exercise activities discussed: Current Exercise Habits: Home exercise routine, Type of exercise: walking;strength training/weights, Time (Minutes): 20,  Frequency (Times/Week): 5, Weekly Exercise (Minutes/Week): 100, Intensity: Mild, Exercise limited by: cardiac condition(s)   Goals Addressed             This Visit's Progress    Exercise 3x per week (30 min per time)       Continue to exercise, "get in shape" and be able to ride his horses more.        Depression Screen PHQ 2/9 Scores 05/20/2021 03/13/2020 07/14/2017 03/09/2017 09/06/2016 08/18/2016 01/18/2016  PHQ - 2 Score 0 0 0 0 2 0 0  PHQ- 9 Score - - _0 0 -  Exception Documentation - - - (No Data) - - -    Fall Risk Fall Risk  05/20/2021 03/13/2020 03/09/2017 01/23/2017 09/06/2016  Falls in the past year? 1 0 Yes Yes No  Number falls in past yr: 0 0 1 2 or more -  Comment While playing [place] with Fall? 0 0 No No -  Risk for fall due to : Impaired balance/gait - (No Data) Other (Comment);Impaired balance/gait (No Data)  Risk for fall due to: Comment - - No history of falls right foot numbness, can't get up No history  Follow up Falls prevention discussed Falls evaluation completed Falls evaluation completed Falls evaluation completed -    FALL RISK PREVENTION PERTAINING TO THE HOME:  Any stairs in or around the home? Yes  If so, are there any without handrails? No  Home free of loose throw rugs in walkways, pet beds, electrical cords, etc? Yes  Adequate lighting in your home to reduce risk of falls? Yes   ASSISTIVE DEVICES UTILIZED TO PREVENT FALLS:  Life alert? No  Use of a cane, walker or w/c? No  Grab  bars in the bathroom? No  Shower chair or bench in shower? No  Elevated toilet seat or a handicapped toilet? Yes   TIMED UP AND GO:  Was the test performed? No .  Phone visit  Cognitive Function:     6CIT Screen 05/20/2021  What Year? 0 points  What month? 0 points  What time? 0 points  Count back from 20 0 points  Months in reverse 0 points  Repeat phrase 0 points  Total Score 0    Immunizations Immunization History  Administered Date(s) Administered   Fluad Quad(high Dose 65+) 02/20/2020, 03/17/2021   Influenza, High Dose Seasonal PF 01/29/2019   Influenza, Quadrivalent, Recombinant, Inj, Pf 03/13/2017, 01/21/2019   Influenza,inj,Quad PF,6+ Mos 02/25/2016   Influenza-Unspecified 02/16/2018, 02/28/2019, 02/12/2020, 02/27/2021   Moderna Covid-19 Vaccine Bivalent Booster 81yr & up 04/05/2021   Moderna Sars-Covid-2 Vaccination 07/05/2019, 07/30/2019, 04/16/2020, 10/27/2020   Pneumococcal Conjugate-13 04/19/2019   Pneumococcal Polysaccharide-23 08/01/2019   Tdap 11/30/2015    TDAP status: Up to date  Flu Vaccine status: Up to date  Pneumococcal vaccine status: Up to date  Covid-19 vaccine status: Completed vaccines  Qualifies for Shingles Vaccine? Yes   Zostavax completed No   Shingrix Completed?: No.    Education has been provided regarding the importance of this vaccine. Patient has been advised to call insurance company to determine out of pocket expense if they have not yet received this vaccine. Advised may also receive vaccine at local pharmacy or Health Dept. Verbalized acceptance and understanding.  Screening Tests Health Maintenance  Topic Date Due   Zoster Vaccines- Shingrix (1 of 2) Never done   FOOT EXAM  08/11/2020   URINE MICROALBUMIN  08/11/2020  HEMOGLOBIN A1C  08/19/2020   OPHTHALMOLOGY EXAM  08/20/2021   COLONOSCOPY (Pts 45-68yr Insurance coverage will need to be confirmed)  05/30/2024   TETANUS/TDAP  11/29/2025   Pneumonia Vaccine 74  Years old  Completed   INFLUENZA VACCINE  Completed   COVID-19 Vaccine  Completed   Hepatitis C Screening  Completed   HPV VACCINES  Aged Out    Health Maintenance  Health Maintenance Due  Topic Date Due   Zoster Vaccines- Shingrix (1 of 2) Never done   FOOT EXAM  08/11/2020   URINE MICROALBUMIN  08/11/2020   HEMOGLOBIN A1C  08/19/2020    Colorectal cancer screening: Type of screening: Colonoscopy. Completed 05/30/2014. Repeat every 10 years  Lung Cancer Screening: (Low Dose CT Chest recommended if Age 74-80years, 30 pack-year currently smoking OR have quit w/in 15years.) does not qualify.    Additional Screening:  Hepatitis C Screening: does qualify; Completed 11/30/2015  Vision Screening: Recommended annual ophthalmology exams for early detection of glaucoma and other disorders of the eye. Is the patient up to date with their annual eye exam?  Yes  Who is the provider or what is the name of the [facility] which the patient [facility] [place] If pt is not established with a provider, would they like to be referred to a provider to establish care? No .   Dental Screening: Recommended annual dental exams for proper oral hygiene  Community Resource Referral / Chronic Care Management: CRR required this visit?  No   CCM required this visit?  No      Plan:     I have personally reviewed and noted the following in the patients chart:   Medical and social history Use of alcohol, tobacco or illicit drugs  Current medications and supplements including opioid prescriptions. Patient is not currently taking opioid prescriptions. Functional ability and status Nutritional status Physical activity Advanced directives List of other physicians Hospitalizations, surgeries, and ER visits in previous 12 months Vitals Screenings to include cognitive, depression, and falls Referrals and appointments  In addition, I have reviewed and discussed with patient  certain preventive protocols, quality metrics, and best practice recommendations. A written personalized care plan for preventive services as well as general preventive health recommendations were provided to patient.     MChriss Driver LPN   [id number]  Nurse Notes: Pt is up to date on all health maintenance except Diabetic Foot exam. Discussed Shingrix and how to obtain. Pt c/o some issues with stress, related to family issues but states he feels like he is handling it okay.

## 2021-05-20 NOTE — Patient Instructions (Signed)
Caleb Butler , Thank you for taking time to come for your Medicare Wellness Visit. I appreciate your ongoing commitment to your health goals. Please review the following plan we discussed and let me know if I can assist you in the future.   Screening recommendations/referrals: Colonoscopy: Done 05/30/2014 Repeat in 10 years  Recommended yearly ophthalmology/optometry visit for glaucoma screening and checkup Recommended yearly dental visit for hygiene and checkup  Vaccinations: Influenza vaccine: [facility] 03/17/2021 Repeat annually  Pneumococcal vaccine: Done 04/19/2019 and 08/01/2019  Tdap vaccine: Done 11/30/2015 Repeat in 10 years  Shingles vaccine: Shingrix discussed. Please contact your pharmacy for coverage information.     Covid-19: Done 07/05/2019, 07/30/2019, 04/16/2020, 10/27/2020 and 04/05/2021.  Advanced directives: Advance directive discussed with you today. Even though you declined this today, please call our office should you change your mind, and we can give you the proper paperwork for you to fill out.   Conditions/risks identified: Aim for 30 minutes of exercise or brisk walking each day, drink 6-8 glasses of water and eat lots of fruits and vegetables.   Next appointment: Follow up in one year for your annual wellness visit. 2023.  Preventive Care 80 Years and Older, Male  Preventive care refers to lifestyle choices and visits with your health care provider that can promote health and wellness. What does preventive care include? A yearly physical exam. This is also called an annual well check. Dental exams once or twice a year. Routine eye exams. Ask your health care provider how often you should have your eyes checked. Personal lifestyle choices, including: Daily care of your teeth and gums. Regular physical activity. Eating a healthy diet. Avoiding tobacco and drug use. Limiting alcohol use. Practicing safe sex. Taking low doses of aspirin every day. Taking vitamin and  mineral supplements as recommended by your health care provider. What happens during an annual well check? The services and screenings done by your health care provider during your annual well check will depend on your age, overall health, lifestyle risk factors, and family history of disease. Counseling  Your health care provider may ask you questions about your: Alcohol use. Tobacco use. Drug use. Emotional well-being. Home and relationship well-being. Sexual activity. Eating habits. History of falls. Memory and ability to understand (cognition). Work and work Statistician. Screening  You may have the following tests or measurements: Height, weight, and BMI. Blood pressure. Lipid and cholesterol levels. These may be checked every 5 years, or more frequently if you are over 3 years old. Skin check. Lung cancer screening. You may have this screening every year starting at age 77 if you have a 30-pack-year history of smoking and currently smoke or have quit within the past 15 years. Fecal occult blood test (FOBT) of the stool. You may have this test every year starting at age 3. Flexible sigmoidoscopy or colonoscopy. You may have a sigmoidoscopy every 5 years or a colonoscopy every 10 years starting at age 12. Prostate cancer screening. Recommendations will vary depending on your family history and other risks. Hepatitis C blood test. Hepatitis B blood test. Sexually transmitted disease (STD) testing. Diabetes screening. This is done by checking your blood sugar (glucose) after you have not eaten for a while (fasting). You may have this done every 1-3 years. Abdominal aortic aneurysm (AAA) screening. You may need this if you are a current or former smoker. Osteoporosis. You may be screened starting at age 27 if you are at high risk. Talk with your health care provider  about your test results, treatment options, and if necessary, the need for more tests. Vaccines  Your health care  provider may recommend certain vaccines, such as: Influenza vaccine. This is recommended every year. Tetanus, diphtheria, and acellular pertussis (Tdap, Td) vaccine. You may need a Td booster every 10 years. Zoster vaccine. You may need this after age 101. Pneumococcal 13-valent conjugate (PCV13) vaccine. One dose is recommended after age 56. Pneumococcal polysaccharide (PPSV23) vaccine. One dose is recommended after age 86. Talk to your health care provider about which screenings and vaccines you need and how often you need them. This information is not intended to replace advice given to you by your health care provider. Make sure you discuss any questions you have with your health care provider. Document Released: 06/12/2015 Document Revised: 02/03/2016 Document Reviewed: 03/17/2015 Elsevier Interactive Patient Education  2017 [place] Prevention in the Home Falls can cause injuries. They can happen to people of all ages. There are many things you can do to make your home safe and to help prevent falls. What can I do on the outside of my home? Regularly fix the edges of walkways and driveways and fix any cracks. Remove anything that might make you trip as you walk through a door, such as a raised step or threshold. Trim any bushes or trees on the path to your home. Use bright outdoor lighting. Clear any walking paths of anything that might make someone trip, such as rocks or tools. Regularly check to see if handrails are loose or broken. Make sure that both sides of any steps have handrails. Any raised decks and porches should have guardrails on the edges. Have any leaves, snow, or ice cleared regularly. Use sand or salt on walking paths during winter. Clean up any spills in your garage right away. This includes oil or grease spills. What can I do in the bathroom? Use night lights. Install grab bars by the toilet and in the tub and shower. Do not use towel bars as grab  bars. Use non-skid mats or decals in the tub or shower. If you need to sit down in the shower, use a plastic, non-slip stool. Keep the floor dry. Clean up any water that spills on the floor as soon as it happens. Remove soap buildup in the tub or shower regularly. Attach bath mats securely with double-sided non-slip rug tape. Do not have throw rugs and other things on the floor that can make you trip. What can I do in the bedroom? Use night lights. Make sure that you have a light by your bed that is easy to reach. Do not use any sheets or blankets that are too big for your bed. They should not hang down onto the floor. Have a firm chair that has side arms. You can use this for support while you get dressed. Do not have throw rugs and other things on the floor that can make you trip. What can I do in the kitchen? Clean up any spills right away. Avoid walking on wet floors. Keep items that you use a lot in easy-to-reach places. If you need to reach something above you, use a strong step stool that has a grab bar. Keep electrical cords out of the way. Do not use floor polish or wax that makes floors slippery. If you must use wax, use non-skid floor wax. Do not have throw rugs and other things on the floor that can make you trip. What can I do  with my stairs? Do not leave any items on the stairs. Make sure that there are handrails on both sides of the stairs and use them. Fix handrails that are broken or loose. Make sure that handrails are as long as the stairways. Check any carpeting to make sure that it is firmly attached to the stairs. Fix any carpet that is loose or worn. Avoid having throw rugs at the top or bottom of the stairs. If you do have throw rugs, attach them to the floor with carpet tape. Make sure that you have a light switch at the top of the stairs and the bottom of the stairs. If you do not have them, ask someone to add them for you. What else can I do to help prevent  falls? Wear shoes that: Do not have high heels. Have rubber bottoms. Are comfortable and fit you well. Are closed at the toe. Do not wear sandals. If you use a stepladder: Make sure that it is fully opened. Do not climb a closed stepladder. Make sure that both sides of the stepladder are locked into place. Ask someone to hold it for you, if possible. Clearly mark and make sure that you can see: Any grab bars or handrails. First and last steps. Where the edge of each step is. Use tools that help you move around (mobility aids) if they are needed. These include: Canes. Walkers. Scooters. Crutches. Turn on the lights when you go into a dark area. Replace any light bulbs as soon as they burn out. Set up your furniture so you have a clear path. Avoid moving your furniture around. If any of your floors are uneven, fix them. If there are any pets around you, be aware of where they are. Review your medicines with your doctor. Some medicines can make you feel dizzy. This can increase your chance of falling. Ask your doctor what other things that you can do to help prevent falls. This information is not intended to replace advice given to you by your health care provider. Make sure you discuss any questions you have with your health care provider. Document Released: 03/12/2009 Document Revised: 10/22/2015 Document Reviewed: 06/20/2014 Elsevier Interactive Patient Education  2017 [employer].

## 2021-05-25 ENCOUNTER — Other Ambulatory Visit: Payer: Self-pay | Admitting: Family Medicine

## 2021-05-27 ENCOUNTER — Telehealth: Payer: Self-pay

## 2021-05-27 NOTE — Telephone Encounter (Signed)
Patient called in requesting a refill of Levothyroxine. Patient had a refill on 12-1 that he picked up from the pharmacy for a 90 day supply. Patient was informed the patient it was too early to refill the medication as of right now.

## 2021-05-27 NOTE — Addendum Note (Signed)
Addended by: Jaynie Crumble on: 05/27/2021 03:29 PM   Modules accepted: Orders

## 2021-06-24 ENCOUNTER — Telehealth: Payer: Self-pay

## 2021-06-24 NOTE — Telephone Encounter (Signed)
-----   Message from Arnoldo Lenis, MD sent at 06/23/2021  3:26 PM EST ----- Regarding: FW: [facility] CMS measure Can we touch base with patient if we was able to get his plavix?   Zandra Abts MD ----- Message ----- From: Rowland Lathe, [facility] Sent: 05/25/2021   2:05 PM EST To: Arnoldo Lenis, MD Subject: Melton Alar: [facility] CMS measure                            Hi Dr. Harl Bowie,   I hope all is well! I called this patient re: rosuvastatin prescription and he happened to ask about his prior request, during the last O/v with you, for a Plavix prescription renewal. I explained that Dr. Dennard Schaumann renewed Plavix w/ two refills but it seems the patient reports he might be out of Plavix and has not been taking the medication at some point. I believe he said he was trying to get the Plavix Rx filled at the [place] and needed a new prescription.   From my experience, I thought a [employer] provider would have to prescribe a medication to be filled and outside providers sometimes facilitate a prescription order. So, I am not sure if Mr. Counsell was trying to allude that you would help facilitate a new prescription for the [place]. I tried to call [facility] with the patient on [address] to refill Plavix but the pharmacy was closed for lunch; Mr. Howk said that he would try back later. If possible, could someone reach out to the patient to clarify Plavix prescription renewal for the [place]?  Thanks,  Kristeen Miss  ----- Message ----- From: Rowland Lathe, [facility] Sent: 04/14/2021   9:11 AM EST To: Arnoldo Lenis, MD Subject: [facility] CMS measure                                Hi Dr. Harl Bowie,   I am a [facility] pharmacist with statin quality initiative. Crestor has not been filled this year and patient confirmed he has not been taking Crestor 20 mg; last LDL 48. Per last cardiology note, it appears patient was supposed to be on Crestor. I just wanted to let you know prior to appointment in December.   Thank you for allowing pharmacy to be a  part of this patient's care.  Kristeen Miss, PharmD Clinical Pharmacist [place] Cell: [phone]

## 2021-06-24 NOTE — Telephone Encounter (Signed)
Pt verbalized that he has gotten his Plavix filled.

## 2021-06-24 NOTE — Telephone Encounter (Signed)
Left a message for pt to call regarding medication management.

## 2021-11-11 ENCOUNTER — Encounter: Payer: Self-pay | Admitting: Cardiology

## 2021-11-12 ENCOUNTER — Encounter: Payer: Self-pay | Admitting: Cardiology

## 2021-11-12 ENCOUNTER — Encounter: Payer: Self-pay | Admitting: *Deleted

## 2021-11-12 ENCOUNTER — Ambulatory Visit: Payer: Medicare HMO | Admitting: Cardiology

## 2021-11-12 VITALS — BP 116/80 | HR 62 | Ht 69.0 in | Wt 177.8 lb

## 2021-11-12 DIAGNOSIS — R0609 Other forms of dyspnea: Secondary | ICD-10-CM | POA: Diagnosis not present

## 2021-11-12 DIAGNOSIS — I2510 Atherosclerotic heart disease of native coronary artery without angina pectoris: Secondary | ICD-10-CM

## 2021-11-12 NOTE — Patient Instructions (Signed)
Medication Instructions:  Continue all current medications.   Labwork: none  Testing/Procedures: none  Follow-Up: 6 months   Any Other Special Instructions Will Be Listed Below (If Applicable).   If you need a refill on your cardiac medications before your next appointment, please call your pharmacy.  

## 2021-11-12 NOTE — Progress Notes (Signed)
Clinical Summary Mr. Coryell is a 76 y.o.male seen today for follow up of the following medical problems.   1.CAD - PCI to ostial LCX in 2018, given stent location indefinitite DAPT was recommended - relook caht 02/2018 with stable findings, does have residual 90% diag disease that recs were for medical management.  - 02/2018 echo LVEF 55-60%, no WMAs, grade I dd, mild MR, mild AI   - 03/2020 nuclear stress: apical scar with mild ischemia - 03/2020 echo LVEF 55-60%, no WMAs, indet diasotlic fxn, mild to mod MR, mild AI, ascending aorta 53mm   - no chest pains. Breathing has worsened since last visit - upcoming stress test at [facility] - compliant with meds     2. Chronic dyspnea - extensive cardiac and pulmonary workup in the past - symptoms overall unchanged.  - abnormal PFTs - reports more recent testing at [facility], will request results.        3. Aortic dilatation - 04/2020 CTA: ascending aorta 3.9 cm - mild and stable, does not require follow up imaging.   4. Hyperlipidemia - compliant with meds, reports recent labs with rheumatology  5. Polymyalgia rheumatica - followed by rheumatology    Past Medical History:  Diagnosis Date   Anxiety    Arthritis    "my back is loaded w/it" (01/06/2017)   CAD (coronary artery disease)    80% ostial lesion in L circumflex (2017)   Chronic lower back pain    GERD (gastroesophageal reflux disease)    Hiatal hernia    High cholesterol    Hypothyroidism    IBS (irritable bowel syndrome)    Numbness and tingling of right lower extremity    "since 11/30/2016" (01/06/2017)   PMR (polymyalgia rheumatica) (HCC)    Type 2 diabetes mellitus (HCC)      Allergies  Allergen Reactions   Bee Venom Swelling   Ceclor [Cefaclor] Hives   Ciprofloxacin Hives   Jardiance [Empagliflozin]    Metformin And Related Diarrhea    upsesomach     Current Outpatient Medications  Medication Sig Dispense Refill   ACCU-CHEK AVIVA PLUS test strip USE  TO CHECK BLOOD SUGAR TWICE DAILY E11.9 100 strip 12   Accu-Chek FastClix Lancets MISC USE TO CHECK BLOOD SUGAR TWICE DAILY E11.9 102 each 12   acetaminophen (TYLENOL) 500 MG tablet Take 500-1,000 mg by mouth daily as needed for moderate pain or headache.     aspirin 81 MG EC tablet Take 81 mg by mouth at bedtime.      blood glucose meter kit and supplies Dispense based on patient and insurance preference. Use up to four times daily as directed. (FOR ICD-10 E10.9, E11.9). 1 each 0   Capsaicin 0.1 % CREA Apply topically as needed.     clopidogrel (PLAVIX) 75 MG tablet Take 1 tablet (75 mg total) by mouth daily. 90 tablet 2   escitalopram (LEXAPRO) 10 MG tablet TAKE 1 TABLET BY MOUTH EVERY DAY 90 tablet 2   folic acid (FOLVITE) 1 MG tablet Take 1 mg by mouth daily.     levothyroxine (SYNTHROID) 75 MCG tablet Take 1 tablet (75 mcg total) by mouth daily. 90 tablet 3   methocarbamol (ROBAXIN) 500 MG tablet Take 1 tablet (500 mg total) by mouth 2 (two) times daily. 20 tablet 0   methotrexate (RHEUMATREX) 2.5 MG tablet Take 12.5 mg by mouth once a week. Caution:Chemotherapy. Protect from light.     mometasone (ELOCON) 0.1 % ointment Apply  topically daily. 45 g 0   Multiple Vitamin (MULTIVITAMIN WITH MINERALS) TABS tablet Take 1 tablet by mouth daily.     nitroGLYCERIN (NITROSTAT) 0.4 MG SL tablet PLACE 1 TABLET UNDER THE TONGUE EVERY 5 MINUTES AS NEEDED FOR CHEST PAIN 150 tablet 1   oxyCODONE-acetaminophen (PERCOCET) 5-325 MG tablet Take 1-2 tablets by mouth every 4 (four) hours as needed for severe pain. 30 tablet 0   rosuvastatin (CRESTOR) 20 MG tablet Take 1 tablet (20 mg total) by mouth daily. 90 tablet 3   Semaglutide,0.25 or 0.5MG /DOS, (OZEMPIC, 0.25 OR 0.5 MG/DOSE,) 2 MG/1.5ML SOPN Inject 0.5 mg into the skin once a week. 1.5 mL 3   vitamin B-12 (CYANOCOBALAMIN) 1000 MCG tablet Take 1,000 mcg by mouth daily.     No current facility-administered medications for this visit.     Past Surgical  History:  Procedure Laterality Date   CARDIAC CATHETERIZATION  06/2016   CORONARY ANGIOPLASTY WITH STENT PLACEMENT  01/06/2017   CORONARY STENT INTERVENTION N/A 01/06/2017   Procedure: CORONARY STENT INTERVENTION;  Surgeon: Sherren Mocha, MD;  Location: [place] [facility];  Service: Cardiovascular;  Laterality: N/A;   FINGER SURGERY Left 1984   ring finger reattached.    FOREARM FRACTURE SURGERY Right 2004   "has 3 rods and 22 screws in it"   INGUINAL HERNIA REPAIR Left    RIGHT/LEFT HEART CATH AND CORONARY ANGIOGRAPHY N/A 07/07/2016   Procedure: Right/Left Heart Cath and Coronary Angiography;  Surgeon: Jolaine Artist, MD;  Location: [place] [facility];  Service: Cardiovascular;  Laterality: N/A;   RIGHT/LEFT HEART CATH AND CORONARY ANGIOGRAPHY N/A 03/13/2018   Procedure: RIGHT/LEFT HEART CATH AND CORONARY ANGIOGRAPHY;  Surgeon: Jolaine Artist, MD;  Location: [place] [facility];  Service: Cardiovascular;  Laterality: N/A;     Allergies  Allergen Reactions   Bee Venom Swelling   Ceclor [Cefaclor] Hives   Ciprofloxacin Hives   Jardiance [Empagliflozin]    Metformin And Related Diarrhea    upsesomach      Family History  Problem Relation Age of Onset   Diabetes Mellitus II Father    Heart failure Father    Kidney failure Father    Heart attack Mother      Social History Mr. Lipinski reports that he has never smoked. He has never used smokeless tobacco. Mr. Isadore reports current alcohol use of about 2.0 standard drinks of alcohol per week.   Review of Systems CONSTITUTIONAL: No weight loss, fever, chills, weakness or fatigue.  HEENT: Eyes: No visual loss, blurred vision, double vision or yellow sclerae.No hearing loss, sneezing, congestion, runny nose or sore throat.  SKIN: No rash or itching.  CARDIOVASCULAR: per hpi RESPIRATORY: No shortness of breath, cough or sputum.  GASTROINTESTINAL: No anorexia, nausea, vomiting or diarrhea. No abdominal pain or blood.   GENITOURINARY: No burning on urination, no polyuria NEUROLOGICAL: No headache, dizziness, syncope, paralysis, ataxia, numbness or tingling in the extremities. No change in bowel or bladder control.  MUSCULOSKELETAL: No muscle, back pain, joint pain or stiffness.  LYMPHATICS: No enlarged nodes. No history of splenectomy.  PSYCHIATRIC: No history of depression or anxiety.  ENDOCRINOLOGIC: No reports of sweating, cold or heat intolerance. No polyuria or polydipsia.  Marland Kitchen   Physical Examination Today's Vitals   11/12/21 0947  BP: 116/80  Pulse: 62  SpO2: 97%  Weight: 177 lb 12.8 oz (80.6 kg)  Height: 5\' 9"  (1.753 m)   Body mass index is 26.26 kg/m.  Gen: resting  comfortably, no acute distress HEENT: no scleral icterus, pupils equal round and reactive, no palptable cervical adenopathy,  CV: RRR, no m/rg, no jvd Resp: Clear to auscultation bilaterally GI: abdomen is soft, non-tender, non-distended, normal bowel sounds, no hepatosplenomegaly MSK: extremities are warm, no edema.  Skin: warm, no rash Neuro:  no focal deficits Psych: appropriate affect   Diagnostic Studies 06/2016 cath The left ventricular ejection fraction is 55-65% by visual estimate. Prox RCA lesion, 40 %stenosed. Ost Cx lesion, 90 %stenosed. Ramus lesion, 90 %stenosed. LM lesion, 20 %stenosed. Mid LAD lesion, 20 %stenosed. 1st Diag lesion, 80 %stenosed.   Findings:   RA = 5 RV = 23/8 PA = 27/4 (16) PCW = 3 Fick cardiac output/index = 5.2/2.6 PVR = 2.5 WU  Ao sat =95% PA sat = 68%, 69%   Assessment: 1. 1-V CAD at ostium of LCX alsi involving ostium of ramus 2. Aneursymal proximal LAD with mild plaque 3. 40% Proximal RCA 4. Normal LVEF with normal hemodynamics   Plan/Discussion:   Case reviewed with Dr. Burt Knack. He has high grade ostial lesion in ostium of LCX also involving Ramus Cletis Muma. It is a moderate-sized system which is not completely favorable to PCI. Will proceed with ETT/Myvoview. If high  ischemic burden will plan angioplasty +/- stenting. Otherwise proceed with aggressive medical therapy.    12/2016 PCI Successful PCI of severe ostial stenosis in the left circumflex, reducing the stenosis from 90% to 0% with [place] angioplasty followed by drug-eluting stent implantation.   Recommendations: Aspirin and Plavix for a minimum of 12 months. If tolerated favor long-term DAPT as the stent is positioned at the left main bifurcation.     02/2018 RHC/LHC LM lesion is 20% stenosed. Ramus lesion is 90% stenosed. Prox RCA lesion is 40% stenosed. Ost 1st Diag to 1st Diag lesion is 90% stenosed. Prox LAD lesion is 30% stenosed. Previously placed Ost Cx to Prox Cx stent (unknown type) is widely patent. Ost LM to Mid LM lesion is 20% stenosed.     Findings:   RA = 7 RV = 32/9 PA = 33/10 (20) PCW = 10 Fick cardiac output/index = 5.4/2.7 PVR = 1.9 WU Ao sat = 93% PA sat = 64%, 68%   Assessment: 1. CAD with widely patent stent in ostial LCX 2. Unchanged 90% lesion in ostium of moderate-sized first diagonal 3. Otherwise mild non-obstructive CAD 4. EF 55-60% 6. Normal RHC numbers     02/2018 echo Study Conclusions   - Left ventricle: The cavity size was normal. Wall thickness was    increased in a pattern of mild LVH. Systolic function was normal.    The estimated ejection fraction was in the range of 55% to 60%.    Wall motion was normal; there were no regional wall motion    abnormalities. Doppler parameters are consistent with abnormal    left ventricular relaxation (grade 1 diastolic dysfunction).  - Aortic valve: There was mild regurgitation.  - Mitral valve: There was mild regurgitation.    03/2020 nuclear stress No diagnostic ST segment changes to indicate ischemia. Small, mild intensity, apical to basal inferolateral defect that is partially reversible mainly at the apex. This is consistent with scar and mild peri-infarct ischemia. This is a low risk  study. Nuclear stress EF: 62%.   03/2020 echo IMPRESSIONS     1. Left ventricular ejection fraction, by estimation, is 55 to 60%. The  left ventricle has normal function. The left ventricle has no regional  wall motion abnormalities. Left ventricular diastolic parameters are  indeterminate.   2. Right ventricular systolic function is normal. The right ventricular  size is normal. There is normal pulmonary artery systolic pressure. The  estimated right ventricular systolic pressure is 16.1 mmHg.   3. The mitral valve is grossly normal. Mild to moderate mitral valve  regurgitation.   4. The aortic valve is tricuspid. Aortic valve regurgitation is mild.  Aortic regurgitation PHT measures 528 msec.   5. Aortic dilatation noted. There is mild dilatation of the ascending  aorta, measuring 41 mm. There is mild dilatation of the aortic root,  measuring 40 mm.   6. The inferior vena cava is normal in size with greater than 50%  respiratory variability, suggesting right atrial pressure of 3 mmHg.     Assessment and Plan  CAD - no recent chest pains - reports upcoming stress test at [facility] for persistent SOB/DOE, we will f/u results   2. DOE - chronic stable symptos, negative cardiac workup in 2021 - reports recent repeat PFTs and upcoming stress testing at [facility], request results.        Arnoldo Lenis, M.D.

## 2022-01-28 ENCOUNTER — Ambulatory Visit (INDEPENDENT_AMBULATORY_CARE_PROVIDER_SITE_OTHER): Payer: No Typology Code available for payment source | Admitting: Pulmonary Disease

## 2022-01-28 ENCOUNTER — Encounter: Payer: Self-pay | Admitting: Pulmonary Disease

## 2022-01-28 VITALS — BP 124/70 | HR 70 | Temp 98.3°F | Ht 69.5 in | Wt 177.2 lb

## 2022-01-28 DIAGNOSIS — J984 Other disorders of lung: Secondary | ICD-10-CM

## 2022-01-28 DIAGNOSIS — I2510 Atherosclerotic heart disease of native coronary artery without angina pectoris: Secondary | ICD-10-CM | POA: Diagnosis not present

## 2022-01-28 DIAGNOSIS — R0602 Shortness of breath: Secondary | ICD-10-CM | POA: Diagnosis not present

## 2022-01-28 DIAGNOSIS — M353 Polymyalgia rheumatica: Secondary | ICD-10-CM

## 2022-01-28 NOTE — Patient Instructions (Signed)
Shortness of breath: As we discussed today, this problem has been evaluated extensively in the past.  Breathlessness can come from many different problems, we will focus on trying to find a lung abnormality. Full pulmonary function test High-resolution CT scan of the chest We may consider more testing including something called respiratory muscle strength testing It is very likely we will refer you to a pulmonary rehab program after we see the results of these test  Follow-up with [place] in 4 to 6 weeks after the lung function test to discuss further.

## 2022-01-28 NOTE — Progress Notes (Signed)
Synopsis: Referred in August 2023 for shortness of breath.  He has a history of coronary artery disease and polymyalgia rheumatica which has been treated with methotrexate for greater than 5 years.  As of June 2023 the methotrexate was held temporarily around the time of a tooth infection.  Never smoker but notes heavy secondhand smoke exposure over the years. Previously seen by [name] and Dr. Chase Caller.  Subjective:   PATIENT ID: Caleb Butler GENDER: male DOB: 1945/08/04, MRN: [id number]   HPI  Chief Complaint  Patient presents with   Consult    SOB/ dry cough at times     Caleb Butler moved here from [place] from [place] in after he retired in 2003.  He has horses here and life was cheaper.   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  He smoked briefly as a young adult after [place], but only really smoked a few.  No marijuana use.    He is here to see me for dyspnea: > he says that it has been getting worse over the years, exertion makes it worse > he says that he feels tightness, can't get his breath, heat makes it worse > any exercise makes it worse > he says that he can walk through a store without difficulty, carrying bags makes him breathless.  He can make it through a game of pickleball but he gets short of breath > wheezes a little when he is short of breath  He feels that his muscles are getting weaker, like picking up a 50 pound bags of feed.  He works part time at [employer].    He has had the dyspnea for a while and saw Dr. Luan Pulling and was told that he had a "breathing problem" but was never really given a diagnosis.    He was on methotrexate for PMR, he took it for 5 years, stopped it recently.    He had a stent put in his heart and had a repeat heart cath in 2019 that was normal.   He has never really  exercised regularly.     Record review: Primary care visit from August 2023 reviewed where the patient was seen for coronary artery disease, dyspnea, depression and PTSD.  He was referred to [place] for further evaluation of the same.  Past Medical History:  Diagnosis Date   Anxiety    Arthritis    "my back is loaded w/it" (01/06/2017)   CAD (coronary artery disease)    80% ostial lesion in L circumflex (2017)   Chronic lower back pain    GERD (gastroesophageal reflux disease)    Hiatal hernia    High cholesterol    Hypothyroidism    IBS (irritable bowel syndrome)    Numbness and tingling of right lower extremity    "since 11/30/2016" (01/06/2017)   PMR (polymyalgia rheumatica) (HCC)    Type 2 diabetes mellitus ([place])      Family History  Problem Relation Age of Onset   Diabetes Mellitus II Father    Heart failure Father    Kidney failure Father    Heart attack Mother      Social History   Socioeconomic History   Marital status: Married    Spouse name: [name]   Number of children: 0   Years of education: 30  Highest education level: Not on file  Occupational History    Comment: retired  Tobacco Use   Smoking status: Never   Smokeless tobacco: Never  Vaping Use   Vaping Use: Never used  Substance and Sexual Activity   Alcohol use: Yes    Alcohol/week: 2.0 standard drinks of alcohol    Types: 1 Glasses of wine, 1 Cans of beer per week    Comment: 1 beer/week   Drug use: No   Sexual activity: Not Currently  Other Topics Concern   Not on file  Social History Narrative   Retired. Lives with wife, [name].    Caffeine- coffee, 1 daily   Social Determinants of Health   Financial Resource Strain: Low Risk  (05/20/2021)   Overall Financial Resource Strain (CARDIA)    Difficulty of Paying Living Expenses: Not hard at all  Food Insecurity: No Food Insecurity (05/20/2021)   Hunger Vital Sign    Worried About Running Out of Food in the Last Year: Never true    Ran Out  of Food in the Last Year: Never true  Transportation Needs: No Transportation Needs (05/20/2021)   PRAPARE - Hydrologist (Medical): No    Lack of Transportation (Non-Medical): No  Physical Activity: Insufficiently Active (05/20/2021)   Exercise Vital Sign    Days of Exercise per Week: 5 days    Minutes of Exercise per Session: 20 min  Stress: No Stress Concern Present (05/20/2021)   [place]    Feeling of Stress : Only a little  Social Connections: Socially Integrated (05/20/2021)   Social Connection and Isolation Panel [NHANES]    Frequency of Communication with Friends and Family: More than three times a week    Frequency of Social Gatherings with Friends and Family: More than three times a week    Attends Religious Services: More than 4 times per year    Active Member of [employer] or Organizations: Yes    Attends Music therapist: More than 4 times per year    Marital Status: Married  Human resources officer Violence: Not At Risk (05/20/2021)   Humiliation, Afraid, Rape, and Kick questionnaire    Fear of Current or Ex-Partner: No    Emotionally Abused: No    Physically Abused: No    Sexually Abused: No     Allergies  Allergen Reactions   Bee Venom Swelling   Ceclor [Cefaclor] Hives   Ciprofloxacin Hives   Jardiance [Empagliflozin]    Metformin And Related Diarrhea    upsesomach     Outpatient Medications Prior to Visit  Medication Sig Dispense Refill   ACCU-CHEK AVIVA PLUS test strip USE TO CHECK BLOOD SUGAR TWICE DAILY E11.9 100 strip 12   Accu-Chek FastClix Lancets MISC USE TO CHECK BLOOD SUGAR TWICE DAILY E11.9 102 each 12   acetaminophen (TYLENOL) 500 MG tablet Take 500-1,000 mg by mouth daily as needed for moderate pain or headache.     aspirin 81 MG EC tablet Take 81 mg by mouth at bedtime.      blood glucose meter kit and supplies Dispense based on patient and  insurance preference. Use up to four times daily as directed. (FOR ICD-10 E10.9, E11.9). 1 each 0   Capsaicin 0.1 % CREA Apply topically as needed.     clopidogrel (PLAVIX) 75 MG tablet Take 1 tablet (75 mg total) by mouth daily. 90 tablet 2   levothyroxine (SYNTHROID) 75 MCG  tablet Take 1 tablet (75 mcg total) by mouth daily. 90 tablet 3   methocarbamol (ROBAXIN) 500 MG tablet Take 1 tablet (500 mg total) by mouth 2 (two) times daily. 20 tablet 0   mometasone (ELOCON) 0.1 % ointment Apply topically daily. 45 g 0   Multiple Vitamin (MULTIVITAMIN WITH MINERALS) TABS tablet Take 1 tablet by mouth daily.     nitroGLYCERIN (NITROSTAT) 0.4 MG SL tablet PLACE 1 TABLET UNDER THE TONGUE EVERY 5 MINUTES AS NEEDED FOR CHEST PAIN 150 tablet 1   rosuvastatin (CRESTOR) 20 MG tablet Take 1 tablet (20 mg total) by mouth daily. 90 tablet 3   Semaglutide,0.25 or 0.5MG/DOS, (OZEMPIC, 0.25 OR 0.5 MG/DOSE,) 2 MG/1.5ML SOPN Inject 0.5 mg into the skin once a week. 1.5 mL 3   vitamin B-12 (CYANOCOBALAMIN) 1000 MCG tablet Take 1,000 mcg by mouth daily.     escitalopram (LEXAPRO) 10 MG tablet TAKE 1 TABLET BY MOUTH EVERY DAY (Patient not taking: Reported on 01/28/2022) 90 tablet 2   folic acid (FOLVITE) 1 MG tablet Take 1 mg by mouth daily. (Patient not taking: Reported on 01/28/2022)     methotrexate (RHEUMATREX) 2.5 MG tablet Take 7.5 mg by mouth once a week. Caution:Chemotherapy. Protect from light. (Patient not taking: Reported on 01/28/2022)     oxyCODONE-acetaminophen (PERCOCET) 5-325 MG tablet Take 1-2 tablets by mouth every 4 (four) hours as needed for severe pain. (Patient not taking: Reported on 01/28/2022) 30 tablet 0   No facility-administered medications prior to visit.    ROS Gen: Denies fever, chills, weight change, fatigue, night sweats HEENT: Denies blurred vision, double vision, hearing loss, tinnitus, sinus congestion, rhinorrhea, sore throat, neck stiffness, dysphagia PULM: per HPI CV: Denies chest pain,  edema, orthopnea, paroxysmal nocturnal dyspnea, palpitations GI: Denies abdominal pain, nausea, vomiting, diarrhea, hematochezia, melena, constipation, change in bowel habits GU: Denies dysuria, hematuria, polyuria, oliguria, urethral discharge Endocrine: Denies hot or cold intolerance, polyuria, polyphagia or appetite change Derm: Denies rash, dry skin, scaling or peeling skin change Heme: Denies easy bruising, bleeding, bleeding gums Neuro: Denies headache, numbness, weakness, slurred speech, loss of memory or consciousness    Objective:  Physical Exam   Vitals:   01/28/22 1104  BP: 124/70  Pulse: 70  Temp: 98.3 F (36.8 C)  TempSrc: Oral  SpO2: 97%  Weight: 177 lb 3.2 oz (80.4 kg)  Height: 5' 9.5" (1.765 m)   Walked 500 feet on room air and O2 saturation was normal  Gen: well appearing, no acute distress HENT: NCAT, OP clear, neck supple without masses Eyes: PERRL, EOMi Lymph: no cervical lymphadenopathy PULM: CTA B CV: RRR, no mgr, no JVD GI: BS+, soft, nontender, no hsm Derm: no rash or skin breakdown MSK: normal bulk and tone Neuro: A&Ox4, CN II-XII intact, strength 5/5 in all 4 extremities Psyche: normal mood and affect   CBC    Component Value Date/Time   WBC 8.6 02/20/2020 1443   RBC 4.46 02/20/2020 1443   HGB 14.9 02/20/2020 1443   HCT 44.6 02/20/2020 1443   PLT 203 02/20/2020 1443   MCV 100.0 02/20/2020 1443   MCH 33.4 (H) 02/20/2020 1443   MCHC 33.4 02/20/2020 1443   RDW 13.7 02/20/2020 1443   LYMPHSABS 1,359 02/20/2020 1443   MONOABS 651 10/10/2016 1248   EOSABS 224 02/20/2020 1443   BASOSABS 77 02/20/2020 1443     Chest imaging: 2017 high-resolution CT scan of the chest images independently reviewed showing very mild nonspecific interstitial changes in  the right lower lobe, otherwise normal pulmonary parenchyma 2021 CT angiogram chest images independently reviewed: Mild interlobular septal thickening in the bases, no pleural effusion,  otherwise normal pulmonary parenchyma  PFT: 12/07/2015 shows FVC 2.75 L/64%, FEV1 2.05 L/65% and a ratio of 75 near normal/100%. No postbronchodilator response in FVC but there was 150 ML postbronchodilator response in FEV1 to 2.20 L/69% with a ratio of 81. Total lung capacity 5.6/77 consistent with restriction. DLCO 20.7/66% with reduced diffusion capacity. 2017 cardiopulmonary exercise stress test ratio 73%, FVC 3.19 L, during exercise his peak ventilation reached 103% of his maximum ventilatory volume indicating ventilatory reserve limits were exceeded.  He had normal functional capacity.  Labs: 2021 hemoglobin 14.9  Path:  Echo: 2021 echocardiogram LVEF 55 to 60%, RV size and function normal, PA systolic pressure estimate 19.3, mild to moderate mitral regurgitation, mild aortic valve regurgitation, mild aortic dilation at 41 mm at the root  Heart Catheterization: 2019 LHC/RHC> RA = 7 RV = 32/9 PA = 33/10 (20) PCW = 10 Fick cardiac output/index = 5.4/2.7 PVR = 1.9 WU;   Assessment: 1. CAD with widely patent stent in ostial LCX 2. Unchanged 90% lesion in ostium of moderate-sized first diagonal 3. Otherwise mild non-obstructive CAD 4. EF 55-60% 6. Normal RHC numbers      Assessment & Plan:   Shortness of breath - Plan: Pulmonary function test, CT Chest High Resolution, CANCELED: POCT EXHALED NITRIC OXIDE  PMR (polymyalgia rheumatica) (HCC)  Restrictive lung disease  Coronary artery disease involving native coronary artery of native heart without angina pectoris  Discussion: Caleb Butler is here to see me today for shortness of breath.  Over the years he has had this complaint many times and has been seen by many good doctors who have assessed this fairly thoroughly.  His work-up in the past has included a cardiac work-up with left heart catheterization, right heart catheterization and cardiopulmonary exercise stress testing.  He has had echocardiograms, recent stress test, lung  function test, and CT scans.  While he has had some evidence of coronary artery disease and diastolic dysfunction, there has never been any convincing evidence of underlying lung disease.  He has had restrictive lung disease noted on lung function testing as well as mild interstitial changes in the bases of his lungs.  While I suppose it is possible that he could have developed an interstitial lung disease over the last several years, based on his normal lung imaging in the last 2 years and normal physical exam I am doubtful that there is any evidence of pulmonary parenchymal disease.  He has had restriction noted, and his cardiopulmonary exercise test in 2017 showed that he was ventilatory limited.  It is not clear to me that he is ever had a neurologic assessment looking for neuromuscular weakness as a cause of restriction.  Given his methotrexate use we should look further to see if there is evidence of interstitial lung disease  A recent cardiology assessment showed that his CAD and aortic dialation were stable.   Plan: Shortness of breath: As we discussed today, this problem has been evaluated extensively in the past.  Breathlessness can come from many different problems, we will focus on trying to find a lung abnormality. Full pulmonary function test High-resolution CT scan of the chest We may consider more testing including something called respiratory muscle strength testing It is very likely we will refer you to a pulmonary rehab program after we see the results of these test  Follow-up with [place] in 4 to 6 weeks after the lung function test to discuss further.    Current Outpatient Medications:    ACCU-CHEK AVIVA PLUS test strip, USE TO CHECK BLOOD SUGAR TWICE DAILY E11.9, Disp: 100 strip, Rfl: 12   Accu-Chek FastClix Lancets MISC, USE TO CHECK BLOOD SUGAR TWICE DAILY E11.9, Disp: 102 each, Rfl: 12   acetaminophen (TYLENOL) 500 MG tablet, Take 500-1,000 mg by mouth daily as needed for  moderate pain or headache., Disp: , Rfl:    aspirin 81 MG EC tablet, Take 81 mg by mouth at bedtime. , Disp: , Rfl:    blood glucose meter kit and supplies, Dispense based on patient and insurance preference. Use up to four times daily as directed. (FOR ICD-10 E10.9, E11.9)., Disp: 1 each, Rfl: 0   Capsaicin 0.1 % CREA, Apply topically as needed., Disp: , Rfl:    clopidogrel (PLAVIX) 75 MG tablet, Take 1 tablet (75 mg total) by mouth daily., Disp: 90 tablet, Rfl: 2   levothyroxine (SYNTHROID) 75 MCG tablet, Take 1 tablet (75 mcg total) by mouth daily., Disp: 90 tablet, Rfl: 3   methocarbamol (ROBAXIN) 500 MG tablet, Take 1 tablet (500 mg total) by mouth 2 (two) times daily., Disp: 20 tablet, Rfl: 0   mometasone (ELOCON) 0.1 % ointment, Apply topically daily., Disp: 45 g, Rfl: 0   Multiple Vitamin (MULTIVITAMIN WITH MINERALS) TABS tablet, Take 1 tablet by mouth daily., Disp: , Rfl:    nitroGLYCERIN (NITROSTAT) 0.4 MG SL tablet, PLACE 1 TABLET UNDER THE TONGUE EVERY 5 MINUTES AS NEEDED FOR CHEST PAIN, Disp: 150 tablet, Rfl: 1   rosuvastatin (CRESTOR) 20 MG tablet, Take 1 tablet (20 mg total) by mouth daily., Disp: 90 tablet, Rfl: 3   Semaglutide,0.25 or 0.5MG/DOS, (OZEMPIC, 0.25 OR 0.5 MG/DOSE,) 2 MG/1.5ML SOPN, Inject 0.5 mg into the skin once a week., Disp: 1.5 mL, Rfl: 3   vitamin B-12 (CYANOCOBALAMIN) 1000 MCG tablet, Take 1,000 mcg by mouth daily., Disp: , Rfl:    escitalopram (LEXAPRO) 10 MG tablet, TAKE 1 TABLET BY MOUTH EVERY DAY (Patient not taking: Reported on 01/28/2022), Disp: 90 tablet, Rfl: 2   folic acid (FOLVITE) 1 MG tablet, Take 1 mg by mouth daily. (Patient not taking: Reported on 01/28/2022), Disp: , Rfl:    methotrexate (RHEUMATREX) 2.5 MG tablet, Take 7.5 mg by mouth once a week. Caution:Chemotherapy. Protect from light. (Patient not taking: Reported on 01/28/2022), Disp: , Rfl:    oxyCODONE-acetaminophen (PERCOCET) 5-325 MG tablet, Take 1-2 tablets by mouth every 4 (four) hours as  needed for severe pain. (Patient not taking: Reported on 01/28/2022), Disp: 30 tablet, Rfl: 0

## 2022-01-30 ENCOUNTER — Other Ambulatory Visit: Payer: Self-pay | Admitting: Family Medicine

## 2022-01-30 DIAGNOSIS — E039 Hypothyroidism, unspecified: Secondary | ICD-10-CM

## 2022-02-01 NOTE — Telephone Encounter (Signed)
PLS CALL PT FOR ANNUAL APPT W/PCP FOR MED EVAL/REFILLS

## 2022-02-07 NOTE — Telephone Encounter (Signed)
CPE scheduled for 02/24/22.

## 2022-02-23 ENCOUNTER — Other Ambulatory Visit: Payer: Self-pay | Admitting: Family Medicine

## 2022-02-23 DIAGNOSIS — E039 Hypothyroidism, unspecified: Secondary | ICD-10-CM

## 2022-02-24 ENCOUNTER — Ambulatory Visit (INDEPENDENT_AMBULATORY_CARE_PROVIDER_SITE_OTHER): Payer: No Typology Code available for payment source | Admitting: Family Medicine

## 2022-02-24 ENCOUNTER — Encounter: Payer: No Typology Code available for payment source | Admitting: Family Medicine

## 2022-02-24 VITALS — BP 114/66 | HR 83 | Temp 98.1°F | Wt 174.0 lb

## 2022-02-24 DIAGNOSIS — I25119 Atherosclerotic heart disease of native coronary artery with unspecified angina pectoris: Secondary | ICD-10-CM | POA: Diagnosis not present

## 2022-02-24 DIAGNOSIS — E039 Hypothyroidism, unspecified: Secondary | ICD-10-CM | POA: Diagnosis not present

## 2022-02-24 DIAGNOSIS — E118 Type 2 diabetes mellitus with unspecified complications: Secondary | ICD-10-CM | POA: Diagnosis not present

## 2022-02-24 DIAGNOSIS — Z23 Encounter for immunization: Secondary | ICD-10-CM

## 2022-02-24 DIAGNOSIS — Z125 Encounter for screening for malignant neoplasm of prostate: Secondary | ICD-10-CM | POA: Diagnosis not present

## 2022-02-24 MED ORDER — HYDROCODONE-ACETAMINOPHEN 5-325 MG PO TABS: 1.0000 | ORAL_TABLET | Freq: Four times a day (QID) | ORAL | 0 refills | Status: DC | PRN

## 2022-02-24 NOTE — Progress Notes (Signed)
Subjective:    Patient ID: Caleb Butler, male    DOB: [birth year], 76 y.o.   MRN: [id number] Patient was originally scheduled for physical exam this morning but he missed his appointment so we worked him in for medicine checkup.  He has a history of coronary artery disease with a 90% ostial lesion in his left circumflex all catheterization in 2017.  He has type 2 diabetes which she takes Ozempic.  He has a history of PMR but he is no longer on methotrexate.  He also has a history of hypothyroidism and hyperlipidemia.  He is also dealing with some mild short-term memory loss.  Over the weekend, he was injured at his farm and landed on his left anterior rib just below his left nipple against a hard metal gate.  He went to an urgent care where an x-ray was "negative.  He denies any hemoptysis however he is exquisitely tender to palpation over the anterior sixth rib.  There is no palpable deformity however he does have some mild crackles in his left lower lung which I believe could be due to atelectasis and splinting.  He has been trying to do deep breathing exercises.  He denies any fevers or chills. Past Medical History:  Diagnosis Date   Anxiety    Arthritis    "my back is loaded w/it" (01/06/2017)   CAD (coronary artery disease)    80% ostial lesion in L circumflex (2017)   Chronic lower back pain    GERD (gastroesophageal reflux disease)    Hiatal hernia    High cholesterol    Hypothyroidism    IBS (irritable bowel syndrome)    Numbness and tingling of right lower extremity    "since 11/30/2016" (01/06/2017)   PMR (polymyalgia rheumatica) (HCC)    Type 2 diabetes mellitus (HCC)    Past Surgical History:  Procedure Laterality Date   CARDIAC CATHETERIZATION  06/2016   CORONARY ANGIOPLASTY WITH STENT PLACEMENT  01/06/2017   CORONARY STENT INTERVENTION N/A 01/06/2017   Procedure: CORONARY STENT INTERVENTION;  Surgeon: Tonny Bollman, MD;  Location: [facility] INVASIVE CV LAB;  Service:  Cardiovascular;  Laterality: N/A;   FINGER SURGERY Left 1984   ring finger reattached.    FOREARM FRACTURE SURGERY Right 2004   "has 3 rods and 22 screws in it"   INGUINAL HERNIA REPAIR Left    RIGHT/LEFT HEART CATH AND CORONARY ANGIOGRAPHY N/A 07/07/2016   Procedure: Right/Left Heart Cath and Coronary Angiography;  Surgeon: Dolores Patty, MD;  Location: [facility] INVASIVE CV LAB;  Service: Cardiovascular;  Laterality: N/A;   RIGHT/LEFT HEART CATH AND CORONARY ANGIOGRAPHY N/A 03/13/2018   Procedure: RIGHT/LEFT HEART CATH AND CORONARY ANGIOGRAPHY;  Surgeon: Dolores Patty, MD;  Location: [facility] INVASIVE CV LAB;  Service: Cardiovascular;  Laterality: N/A;   Current Outpatient Medications on File Prior to Visit  Medication Sig Dispense Refill   ACCU-CHEK AVIVA PLUS test strip USE TO CHECK BLOOD SUGAR TWICE DAILY E11.9 100 strip 12   Accu-Chek FastClix Lancets MISC USE TO CHECK BLOOD SUGAR TWICE DAILY E11.9 102 each 12   acetaminophen (TYLENOL) 500 MG tablet Take 500-1,000 mg by mouth daily as needed for moderate pain or headache.     aspirin 81 MG EC tablet Take 81 mg by mouth at bedtime.      blood glucose meter kit and supplies Dispense based on patient and insurance preference. Use up to four times daily as directed. (FOR ICD-10 E10.9, E11.9). 1 each 0  Capsaicin 0.1 % CREA Apply topically as needed.     clopidogrel (PLAVIX) 75 MG tablet TAKE 1 TABLET BY MOUTH EVERY DAY 30 tablet 1   levothyroxine (SYNTHROID) 75 MCG tablet Take 1 tablet (75 mcg total) by mouth daily. 90 tablet 3   mometasone (ELOCON) 0.1 % ointment Apply topically daily. 45 g 0   Multiple Vitamin (MULTIVITAMIN WITH MINERALS) TABS tablet Take 1 tablet by mouth daily.     nitroGLYCERIN (NITROSTAT) 0.4 MG SL tablet PLACE 1 TABLET UNDER THE TONGUE EVERY 5 MINUTES AS NEEDED FOR CHEST PAIN 150 tablet 1   rosuvastatin (CRESTOR) 20 MG tablet Take 1 tablet (20 mg total) by mouth daily. 90 tablet 3   Semaglutide,0.25 or 0.5MG /DOS,  (OZEMPIC, 0.25 OR 0.5 MG/DOSE,) 2 MG/1.5ML SOPN Inject 0.5 mg into the skin once a week. 1.5 mL 3   tizanidine (ZANAFLEX) 2 MG capsule Take 2 mg by mouth 3 (three) times daily.     vitamin B-12 (CYANOCOBALAMIN) 1000 MCG tablet Take 1,000 mcg by mouth daily.     No current facility-administered medications on file prior to visit.   Allergies  Allergen Reactions   Bee Venom Swelling   Ceclor [Cefaclor] Hives   Ciprofloxacin Hives   Jardiance [Empagliflozin]    Metformin And Related Diarrhea    upsesomach   Social History   Socioeconomic History   Marital status: Married    Spouse name: [name]   Number of children: 0   Years of education: 12   Highest education level: Not on file  Occupational History    Comment: retired  Tobacco Use   Smoking status: Never   Smokeless tobacco: Never  Vaping Use   Vaping Use: Never used  Substance and Sexual Activity   Alcohol use: Yes    Alcohol/week: 2.0 standard drinks of alcohol    Types: 1 Glasses of wine, 1 Cans of beer per week    Comment: 1 beer/week   Drug use: No   Sexual activity: Not Currently  Other Topics Concern   Not on file  Social History Narrative   Retired. Lives with wife, [name].    Caffeine- coffee, 1 daily   Social Determinants of Health   Financial Resource Strain: Low Risk  (05/20/2021)   Overall Financial Resource Strain (CARDIA)    Difficulty of Paying Living Expenses: Not hard at all  Food Insecurity: No Food Insecurity (05/20/2021)   Hunger Vital Sign    Worried About Running Out of Food in the Last Year: Never true    Ran Out of Food in the Last Year: Never true  Transportation Needs: No Transportation Needs (05/20/2021)   PRAPARE - Hydrologist (Medical): No    Lack of Transportation (Non-Medical): No  Physical Activity: Insufficiently Active (05/20/2021)   Exercise Vital Sign    Days of Exercise per Week: 5 days    Minutes of Exercise per Session: 20 min  Stress:  No Stress Concern Present (05/20/2021)   [place]    Feeling of Stress : Only a little  Social Connections: Socially Integrated (05/20/2021)   Social Connection and Isolation Panel [NHANES]    Frequency of Communication with Friends and Family: More than three times a week    Frequency of Social Gatherings with Friends and Family: More than three times a week    Attends Religious Services: More than 4 times per year    Active Member of [employer]  or Organizations: Yes    Attends Archivist Meetings: More than 4 times per year    Marital Status: Married  Human resources officer Violence: Not At Risk (05/20/2021)   Humiliation, Afraid, Rape, and Kick questionnaire    Fear of Current or Ex-Partner: No    Emotionally Abused: No    Physically Abused: No    Sexually Abused: No   Family History  Problem Relation Age of Onset   Diabetes Mellitus II Father    Heart failure Father    Kidney failure Father    Heart attack Mother       Review of Systems  All other systems reviewed and are negative.      Objective:   Physical Exam Vitals reviewed.  Constitutional:      General: He is not in acute distress.    Appearance: He is well-developed. He is not diaphoretic.  HENT:     Head: Normocephalic and atraumatic.     Right Ear: External ear normal.     Left Ear: External ear normal.     Nose: Nose normal.     Mouth/Throat:     Pharynx: No oropharyngeal exudate.  Eyes:     General: No visual field deficit or scleral icterus.       Right eye: No discharge.        Left eye: No discharge.     Conjunctiva/sclera: Conjunctivae normal.     Pupils: Pupils are equal, round, and reactive to light.  Neck:     Thyroid: No thyromegaly.     Vascular: No JVD.     Trachea: No tracheal deviation.  Cardiovascular:     Rate and Rhythm: Normal rate and regular rhythm.     Heart sounds: Normal heart sounds. No murmur heard.     No friction rub. No gallop.  Pulmonary:     Effort: Pulmonary effort is normal. No respiratory distress.     Breath sounds: Normal breath sounds. No stridor. No wheezing or rales.  Chest:     Chest wall: No tenderness.  Abdominal:     General: Bowel sounds are normal. There is no distension.     Palpations: Abdomen is soft. There is no mass.     Tenderness: There is no abdominal tenderness. There is no guarding or rebound.  Musculoskeletal:        General: No tenderness.     Cervical back: Normal range of motion and neck supple.  Lymphadenopathy:     Cervical: No cervical adenopathy.  Skin:    General: Skin is warm.     Coloration: Skin is not pale.     Findings: No erythema or rash.  Neurological:     General: No focal deficit present.     Mental Status: He is alert and oriented to person, place, and time.     Cranial Nerves: No cranial nerve deficit, dysarthria or facial asymmetry.     Sensory: Sensation is intact. No sensory deficit.     Motor: Motor function is intact. No weakness, tremor, atrophy, abnormal muscle tone, seizure activity or pronator drift.     Coordination: Coordination is intact. Romberg sign negative. Coordination normal. Finger-Nose-Finger Test and Heel to [facility] Test normal.     Gait: Gait is intact. Gait and tandem walk normal.     Deep Tendon Reflexes: Reflexes are normal and symmetric.           Assessment & Plan:  Hypothyroidism, unspecified type - Plan: TSH  Coronary artery disease involving native coronary artery of native heart with angina pectoris ([place])  Controlled type 2 diabetes mellitus with complication, without long-term current use of insulin (HCC) - Plan: Lipid panel, COMPLETE METABOLIC PANEL WITH GFR, Hemoglobin A1c  Prostate cancer screening - Plan: PSA Screen for prostate cancer today by checking it PSA.  I will monitor his hypothyroidism checking a TSH.  Given his history of diabetes I am and check a hemoglobin A1c to ensure that it  is below 6.5.  Monitor fasting lipid panel.  Goal LDL cholesterol is less than 70 given his history of coronary artery disease.  Monitor his liver and kidney test with a CMP.  I did give the patient Norco 5/325 1 p.o. every 6 hours as needed pain for what I suspect is a cracked rib.  At the present time there is no evidence of pneumonia or pneumothorax.  I did encourage incentive spirometry.  He also got his flu shot today and I encouraged him to get a COVID booster.

## 2022-02-25 ENCOUNTER — Ambulatory Visit (HOSPITAL_BASED_OUTPATIENT_CLINIC_OR_DEPARTMENT_OTHER)
Admission: RE | Admit: 2022-02-25 | Discharge: 2022-02-25 | Disposition: A | Discharge status: Home / Self Care | Payer: No Typology Code available for payment source | Source: Ambulatory Visit | Attending: Pulmonary Disease | Admitting: Pulmonary Disease

## 2022-02-25 DIAGNOSIS — R0602 Shortness of breath: Secondary | ICD-10-CM | POA: Diagnosis present

## 2022-02-25 LAB — LIPID PANEL
Cholesterol: 116 mg/dL (ref <200)
HDL: 45 mg/dL (ref >=40)
LDL Cholesterol (Calc): 45 mg/dL (calc)
Non-HDL Cholesterol (Calc): 71 mg/dL (calc) (ref <130)
Total CHOL/HDL Ratio: 2.6 (calc) (ref <5.0)
Triglycerides: 187 mg/dL — ABNORMAL HIGH (ref <150)

## 2022-02-25 LAB — HEMOGLOBIN A1C
Hgb A1c MFr Bld: 6.8 % of total Hgb — ABNORMAL HIGH (ref <5.7)
Mean Plasma Glucose: 148 mg/dL
eAG (mmol/L): 8.2 mmol/L

## 2022-02-25 LAB — TSH: TSH: 1.22 mIU/L (ref 0.40–4.50)

## 2022-02-25 LAB — COMPLETE METABOLIC PANEL WITH GFR
AG Ratio: 2 (calc) (ref 1.0–2.5)
ALT: 25 U/L (ref 9–46)
AST: 22 U/L (ref 10–35)
Albumin: 4.5 g/dL (ref 3.6–5.1)
Alkaline phosphatase (APISO): 60 U/L (ref 35–144)
BUN/Creatinine Ratio: 18 (calc) (ref 6–22)
BUN: 24 mg/dL (ref 7–25)
CO2: 26 mmol/L (ref 20–32)
Calcium: 9.3 mg/dL (ref 8.6–10.3)
Chloride: 104 mmol/L (ref 98–110)
Creat: 1.31 mg/dL — ABNORMAL HIGH (ref 0.70–1.28)
Globulin: 2.2 g/dL (calc) (ref 1.9–3.7)
Glucose, Bld: 183 mg/dL — ABNORMAL HIGH (ref 65–99)
Potassium: 4.1 mmol/L (ref 3.5–5.3)
Sodium: 139 mmol/L (ref 135–146)
Total Bilirubin: 0.6 mg/dL (ref 0.2–1.2)
Total Protein: 6.7 g/dL (ref 6.1–8.1)
eGFR: 57 mL/min/{1.73_m2} — ABNORMAL LOW (ref >=60)

## 2022-02-25 LAB — PSA: PSA: 1.28 ng/mL (ref <=4.00)

## 2022-02-25 NOTE — Addendum Note (Signed)
Addended by: Randal Buba K on: 02/25/2022 07:57 AM   Modules accepted: Orders

## 2022-03-03 ENCOUNTER — Other Ambulatory Visit: Payer: Self-pay

## 2022-03-03 MED ORDER — SOLIFENACIN SUCCINATE 10 MG PO TABS: 10.0000 mg | ORAL_TABLET | Freq: Every day | ORAL | 1 refills | Status: DC

## 2022-03-15 ENCOUNTER — Ambulatory Visit (INDEPENDENT_AMBULATORY_CARE_PROVIDER_SITE_OTHER): Payer: No Typology Code available for payment source | Admitting: Pulmonary Disease

## 2022-03-15 ENCOUNTER — Encounter: Payer: Self-pay | Admitting: Pulmonary Disease

## 2022-03-15 VITALS — BP 120/72 | HR 76 | Ht 69.5 in | Wt 176.0 lb

## 2022-03-15 DIAGNOSIS — J849 Interstitial pulmonary disease, unspecified: Secondary | ICD-10-CM | POA: Diagnosis not present

## 2022-03-15 DIAGNOSIS — R0602 Shortness of breath: Secondary | ICD-10-CM | POA: Diagnosis not present

## 2022-03-15 DIAGNOSIS — I7121 Aneurysm of the ascending aorta, without rupture: Secondary | ICD-10-CM | POA: Diagnosis not present

## 2022-03-15 LAB — PULMONARY FUNCTION TEST
DL/VA % pred: 114 %
DL/VA: 4.56 ml/min/mmHg/L
DLCO cor % pred: 81 %
DLCO cor: 19.88 ml/min/mmHg
DLCO unc % pred: 81 %
DLCO unc: 19.88 ml/min/mmHg
FEF 25-75 Post: 2.79 L/sec
FEF 25-75 Pre: 1.73 L/sec
FEF2575-%Change-Post: 61 %
FEF2575-%Pred-Post: 130 %
FEF2575-%Pred-Pre: 81 %
FEV1-%Change-Post: 14 %
FEV1-%Pred-Post: 74 %
FEV1-%Pred-Pre: 65 %
FEV1-Post: 2.2 L
FEV1-Pre: 1.92 L
FEV1FVC-%Change-Post: 5 %
FEV1FVC-%Pred-Pre: 107 %
FEV6-%Change-Post: 8 %
FEV6-%Pred-Post: 69 %
FEV6-%Pred-Pre: 64 %
FEV6-Post: 2.67 L
FEV6-Pre: 2.45 L
FEV6FVC-%Pred-Post: 106 %
FEV6FVC-%Pred-Pre: 106 %
FVC-%Change-Post: 8 %
FVC-%Pred-Post: 65 %
FVC-%Pred-Pre: 60 %
FVC-Post: 2.67 L
FVC-Pre: 2.45 L
Post FEV1/FVC ratio: 83 %
Post FEV6/FVC ratio: 100 %
Pre FEV1/FVC ratio: 78 %
Pre FEV6/FVC Ratio: 100 %
RV % pred: 98 %
RV: 2.47 L
TLC % pred: 76 %
TLC: 5.21 L

## 2022-03-15 NOTE — Progress Notes (Signed)
Full PFT performed today. °

## 2022-03-15 NOTE — Addendum Note (Signed)
Addended by: Valerie Salts on: 03/15/2022 10:39 AM   Modules accepted: Orders

## 2022-03-15 NOTE — Patient Instructions (Signed)
Mild interstitial lung disease: As described today, your lungs are overall very healthy you does have very minor scarring in the bottom parts of your lungs We will follow this with a repeat lung function test in a year Stay physically active, continue regular exercise  Aortic aneurysm, ascending: As we discussed today it is a good idea to talk to Dr. Harl Bowie about this  We will see you back in 1 year after lung function test or sooner if needed

## 2022-03-15 NOTE — Patient Instructions (Signed)
Full PFT performed today. °

## 2022-03-15 NOTE — Progress Notes (Signed)
Synopsis: Referred in August 2023 for shortness of breath.  He has a history of coronary artery disease and polymyalgia rheumatica which has been treated with methotrexate for greater than 5 years.  As of June 2023 the methotrexate was held temporarily around the time of a tooth infection.  Never smoker but notes heavy secondhand smoke exposure over the years. Previously seen by [name] and Dr. Chase Caller.  Subjective:   PATIENT ID: Caleb Butler GENDER: male DOB: [birth year], MRN: [id number]   HPI  Chief Complaint  Patient presents with   Follow-up    F/U after PFT   Caleb Butler says that he still has some trouble breathing. He fell a few weeks ago off of his tractor onto a fence post and landed on his left side.  He says he has been having some pain in his chest since then.  No cough or pneumonia since the last visit.  In fact, he says that he does not recall ever having a severe lung infection in the past  Past Medical History:  Diagnosis Date   Anxiety    Arthritis    "my back is loaded w/it" (01/06/2017)   CAD (coronary artery disease)    80% ostial lesion in L circumflex (2017)   Chronic lower back pain    GERD (gastroesophageal reflux disease)    Hiatal hernia    High cholesterol    Hypothyroidism    IBS (irritable bowel syndrome)    Numbness and tingling of right lower extremity    "since 11/30/2016" (01/06/2017)   PMR (polymyalgia rheumatica) (HCC)    Type 2 diabetes mellitus ([place])      Review of Systems  Constitutional:  Negative for chills, malaise/fatigue and weight loss.  HENT:  Negative for congestion, sinus pain and sore throat.   Respiratory:  Positive for shortness of breath. Negative for cough, hemoptysis, sputum production, wheezing and stridor.   Cardiovascular:  Negative for palpitations, orthopnea, claudication and leg swelling.      Objective:  Physical Exam   Vitals:   03/15/22 0952  BP: 120/72  Pulse: 76  SpO2: 97%  Weight: 176 lb  (79.8 kg)  Height: 5' 9.5" (1.765 m)   Walked 500 feet on room air and O2 saturation was normal  Gen: well appearing HENT: OP clear, TM's clear, neck supple PULM: CTA B, normal percussion CV: RRR, no mgr, trace edema GI: BS+, soft, nontender Derm: no cyanosis or rash Psyche: normal mood and affect   CBC    Component Value Date/Time   WBC 8.6 02/20/2020 1443   RBC 4.46 02/20/2020 1443   HGB 14.9 02/20/2020 1443   HCT 44.6 02/20/2020 1443   PLT 203 02/20/2020 1443   MCV 100.0 02/20/2020 1443   MCH 33.4 (H) 02/20/2020 1443   MCHC 33.4 02/20/2020 1443   RDW 13.7 02/20/2020 1443   LYMPHSABS 1,359 02/20/2020 1443   MONOABS 651 10/10/2016 1248   EOSABS 224 02/20/2020 1443   BASOSABS 77 02/20/2020 1443     Chest imaging: 2017 high-resolution CT scan of the chest images independently reviewed showing very mild nonspecific interstitial changes in the right lower lobe, otherwise normal pulmonary parenchyma 2021 CT angiogram chest images independently reviewed: Mild interlobular septal thickening in the bases, no pleural effusion, otherwise normal pulmonary parenchyma October 2023 high-resolution CT scan of the chest showed scattered subpleural reticular densities similar to the 2021 study.  Could be postinfectious or inflammatory.  Indeterminate for UIP.  Air-trapping noted.  4.1 cm  ascending aortic aneurysm.  Old rib fractures.  PFT: 12/07/2015 shows FVC 2.75 L/64%, FEV1 2.05 L/65% and a ratio of 75 near normal/100%. No postbronchodilator response in FVC but there was 150 ML postbronchodilator response in FEV1 to 2.20 L/69% with a ratio of 81. Total lung capacity 5.6/77 consistent with restriction. DLCO 20.7/66% with reduced diffusion capacity. 2017 cardiopulmonary exercise stress test ratio 73%, FVC 3.19 L, during exercise his peak ventilation reached 103% of his maximum ventilatory volume indicating ventilatory reserve limits were exceeded.  He had normal functional capacity. October  2023 ratio 78%, FVC 2.67 L 65% predicted, total lung capacity 5.21 L 76% predicted, DLCO 19.88 mL/min/mmHg 81% predicted  Labs: 2021 hemoglobin 14.9  Path:  Echo: 2021 echocardiogram LVEF 55 to 60%, RV size and function normal, PA systolic pressure estimate 19.3, mild to moderate mitral regurgitation, mild aortic valve regurgitation, mild aortic dilation at 41 mm at the root  Heart Catheterization: 2019 LHC/RHC> RA = 7 RV = 32/9 PA = 33/10 (20) PCW = 10 Fick cardiac output/index = 5.4/2.7 PVR = 1.9 WU;   Assessment: 1. CAD with widely patent stent in ostial LCX 2. Unchanged 90% lesion in ostium of moderate-sized first diagonal 3. Otherwise mild non-obstructive CAD 4. EF 55-60% 6. Normal RHC numbers      Assessment & Plan:   ILD (interstitial lung disease) ([place])  Aneurysm of ascending aorta without rupture ([place])  Discussion: Caleb Butler has very mild nonspecific interstitial changes in the bases of his lungs of undetermined etiology.  Could be related to prior methotrexate use though this is not a typical pattern for that.  Could also be related to her prior lung infection.  The good news is, there is no evidence of progression and lung function testing over 6 years and imaging over 2 years.  At this time there is no further treatment recommended.  I explained to him today that my overall assessment is that he has very healthy lungs with some minor scarring in the bases.  Plan: Mild interstitial lung disease: As described today, your lungs are overall very healthy you does have very minor scarring in the bottom parts of your lungs We will follow this with a repeat lung function test in a year Stay physically active, continue regular exercise  Aortic aneurysm, ascending: As we discussed today it is a good idea to talk to Dr. Harl Bowie about this  We will see you back in 1 year after lung function test or sooner if needed  Immunization History  Administered Date(s) Administered    Fluad Quad(high Dose 65+) 02/20/2020, 03/17/2021   Influenza, High Dose Seasonal PF 01/29/2019   Influenza, Quadrivalent, Recombinant, Inj, Pf 03/13/2017, 01/21/2019   Influenza,inj,Quad PF,6+ Mos 02/25/2016   Influenza-Unspecified 02/16/2018, 02/28/2019, 02/12/2020, 02/27/2021   Moderna Covid-19 Vaccine Bivalent Booster 51yr & up 04/05/2021   Moderna Sars-Covid-2 Vaccination 07/05/2019, 07/30/2019, 04/16/2020, 10/27/2020   Pneumococcal Conjugate-13 04/19/2019   Pneumococcal Polysaccharide-23 08/01/2019   Tdap 11/30/2015   Zoster Recombinat (Shingrix) 10/05/2021, 01/14/2022     Current Outpatient Medications:    ACCU-CHEK AVIVA PLUS test strip, USE TO CHECK BLOOD SUGAR TWICE DAILY E11.9, Disp: 100 strip, Rfl: 12   Accu-Chek FastClix Lancets MISC, USE TO CHECK BLOOD SUGAR TWICE DAILY E11.9, Disp: 102 each, Rfl: 12   acetaminophen (TYLENOL) 500 MG tablet, Take 500-1,000 mg by mouth daily as needed for moderate pain or headache., Disp: , Rfl:    aspirin 81 MG EC tablet, Take 81 mg by mouth at bedtime. ,  Disp: , Rfl:    blood glucose meter kit and supplies, Dispense based on patient and insurance preference. Use up to four times daily as directed. (FOR ICD-10 E10.9, E11.9)., Disp: 1 each, Rfl: 0   Capsaicin 0.1 % CREA, Apply topically as needed., Disp: , Rfl:    clopidogrel (PLAVIX) 75 MG tablet, TAKE 1 TABLET BY MOUTH EVERY DAY, Disp: 30 tablet, Rfl: 1   levothyroxine (SYNTHROID) 75 MCG tablet, Take 1 tablet (75 mcg total) by mouth daily., Disp: 90 tablet, Rfl: 3   mometasone (ELOCON) 0.1 % ointment, Apply topically daily., Disp: 45 g, Rfl: 0   Multiple Vitamin (MULTIVITAMIN WITH MINERALS) TABS tablet, Take 1 tablet by mouth daily., Disp: , Rfl:    rosuvastatin (CRESTOR) 20 MG tablet, Take 1 tablet (20 mg total) by mouth daily., Disp: 90 tablet, Rfl: 3   Semaglutide,0.25 or 0.5MG/DOS, (OZEMPIC, 0.25 OR 0.5 MG/DOSE,) 2 MG/1.5ML SOPN, Inject 0.5 mg into the skin once a week., Disp: 1.5 mL, Rfl:  3   vitamin B-12 (CYANOCOBALAMIN) 1000 MCG tablet, Take 1,000 mcg by mouth daily., Disp: , Rfl:    solifenacin (VESICARE) 10 MG tablet, Take 1 tablet (10 mg total) by mouth daily. (Patient not taking: Reported on 03/15/2022), Disp: 90 tablet, Rfl: 1  > 50% of this 32 min visit spent face to face

## 2022-03-18 ENCOUNTER — Other Ambulatory Visit: Payer: Self-pay | Admitting: Family Medicine

## 2022-03-18 DIAGNOSIS — I25119 Atherosclerotic heart disease of native coronary artery with unspecified angina pectoris: Secondary | ICD-10-CM

## 2022-03-18 DIAGNOSIS — E039 Hypothyroidism, unspecified: Secondary | ICD-10-CM

## 2022-03-18 NOTE — Telephone Encounter (Signed)
Requested medication (s) are due for refill todayyes  Requested medication (s) are on the active medication list: yes  Last refill:  02/23/22  Future visit scheduled: yes  Notes to clinic:  Unable to refill per protocol, pharmacy request 90 day supply. Dx code needed for 90 day   Requested Prescriptions  Pending Prescriptions Disp Refills   clopidogrel (PLAVIX) 75 MG tablet [Pharmacy Med Name: CLOPIDOGREL 75 MG TABLET] 90 tablet 1    Sig: TAKE 1 TABLET BY MOUTH EVERY DAY     Hematology: Antiplatelets - clopidogrel Failed - 03/18/2022 10:30 AM      Failed - HCT in normal range and within 180 days    HCT  Date Value Ref Range Status  02/20/2020 44.6 38.5 - 50.0 % Final         Failed - HGB in normal range and within 180 days    Hemoglobin  Date Value Ref Range Status  02/20/2020 14.9 13.2 - 17.1 g/dL Final         Failed - PLT in normal range and within 180 days    Platelets  Date Value Ref Range Status  02/20/2020 203 140 - 400 Thousand/uL Final         Failed - Cr in normal range and within 360 days    Creat  Date Value Ref Range Status  02/24/2022 1.31 (H) 0.70 - 1.28 mg/dL Final         Failed - Valid encounter within last 6 months    Recent Outpatient Visits           10 months ago New daily persistent headache   [place] Susy Frizzle, MD   1 year ago Rib pain on right side   [place] Pickard, Cammie Mcgee, MD   1 year ago Dyspnea on exertion   [place] Susy Frizzle, MD   2 years ago Dyspnea on exertion   [place] Susy Frizzle, MD   2 years ago Memory loss   [place], Cammie Mcgee, MD       Future Appointments             In 1 week Pickard, Cammie Mcgee, MD [place], [facility]   In 2 months [place], Alphonse Guild, MD [place]. [facility], [place]

## 2022-03-25 ENCOUNTER — Ambulatory Visit (INDEPENDENT_AMBULATORY_CARE_PROVIDER_SITE_OTHER): Payer: No Typology Code available for payment source | Admitting: Family Medicine

## 2022-03-25 ENCOUNTER — Encounter: Payer: Self-pay | Admitting: Family Medicine

## 2022-03-25 VITALS — BP 120/68 | HR 68 | Ht 69.5 in | Wt 176.2 lb

## 2022-03-25 DIAGNOSIS — Z1211 Encounter for screening for malignant neoplasm of colon: Secondary | ICD-10-CM

## 2022-03-25 DIAGNOSIS — E039 Hypothyroidism, unspecified: Secondary | ICD-10-CM

## 2022-03-25 DIAGNOSIS — I25119 Atherosclerotic heart disease of native coronary artery with unspecified angina pectoris: Secondary | ICD-10-CM | POA: Diagnosis not present

## 2022-03-25 DIAGNOSIS — Z0000 Encounter for general adult medical examination without abnormal findings: Secondary | ICD-10-CM | POA: Diagnosis not present

## 2022-03-25 DIAGNOSIS — E118 Type 2 diabetes mellitus with unspecified complications: Secondary | ICD-10-CM

## 2022-03-25 MED ORDER — SILDENAFIL CITRATE 100 MG PO TABS: 50.0000 mg | ORAL_TABLET | Freq: Every day | ORAL | 11 refills | Status: DC | PRN

## 2022-03-25 NOTE — Progress Notes (Signed)
Subjective:    Patient ID: Caleb Butler, male    DOB: August 13, 1945, 76 y.o.   MRN: [id number] Patient is here today for complete physical exam.  He continues to report some mild age-related decline in his memory.  He denies any depression.  He has fallen twice however on both instances he tripped.  He is very active.  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  He is had his flu shot.  He has had his shingles vaccine.  He is up-to-date on his pneumonia vaccine.  He does need a COVID booster.  He is overdue for a colonoscopy.  We discussed this and he prefers the Cologuard.  He also reports some erectile dysfunction otherwise he is doing well.  He has a history of coronary artery disease with a 90% ostial lesion in his left circumflex all catheterization in 2017.  He has type 2 diabetes which she takes Ozempic.  He has a history of PMR but he is no longer on methotrexate.  He also has a history of hypothyroidism and hyperlipidemia.  He is also dealing with some mild short-term memory loss.   Immunization History  Administered Date(s) Administered   Fluad Quad(high Dose 65+) 02/20/2020, 03/17/2021   Influenza, High Dose Seasonal PF 01/29/2019   Influenza, Quadrivalent, Recombinant, Inj, Pf 03/13/2017, 01/21/2019   Influenza,inj,Quad PF,6+ Mos 02/25/2016   Influenza-Unspecified 02/16/2018, 02/28/2019, 02/12/2020, 02/27/2021   Moderna Covid-19 Vaccine Bivalent Booster 52yr & up 04/05/2021   Moderna Sars-Covid-2 Vaccination 07/05/2019, 07/30/2019, 04/16/2020, 10/27/2020   Pneumococcal Conjugate-13 04/19/2019   Pneumococcal Polysaccharide-23 08/01/2019   Tdap 11/30/2015   Zoster Recombinat (Shingrix) 10/05/2021, 01/14/2022    Past Medical History:  Diagnosis Date   Anxiety    Arthritis    "my back is loaded w/it" (01/06/2017)   CAD (coronary artery disease)    80% ostial lesion in L circumflex (2017)   Chronic lower back pain     GERD (gastroesophageal reflux disease)    Hiatal hernia    High cholesterol    Hypothyroidism    IBS (irritable bowel syndrome)    Numbness and tingling of right lower extremity    "since 11/30/2016" (01/06/2017)   PMR (polymyalgia rheumatica) ([place]    Type 2 diabetes mellitus ([place]    Past Surgical History:  Procedure Laterality Date   CARDIAC CATHETERIZATION  06/2016   CORONARY ANGIOPLASTY WITH STENT PLACEMENT  01/06/2017   CORONARY STENT INTERVENTION N/A 01/06/2017   Procedure: CORONARY STENT INTERVENTION;  Surgeon: CSherren Mocha MD;  [facility];  Service: Cardiovascular;  Laterality: N/A;   FINGER SURGERY Left 1984   ring finger reattached.    FOREARM FRACTURE SURGERY Right 2004   "has 3 rods and 22 screws in it"   INGUINAL HERNIA REPAIR Left    RIGHT/LEFT HEART CATH AND CORONARY ANGIOGRAPHY N/A 07/07/2016   Procedure: Right/Left Heart Cath and Coronary Angiography;  Surgeon: DJolaine Artist MD;  [facility];  Service: Cardiovascular;  Laterality: N/A;   RIGHT/LEFT HEART CATH AND CORONARY ANGIOGRAPHY N/A 03/13/2018   Procedure: RIGHT/LEFT HEART CATH AND CORONARY ANGIOGRAPHY;  Surgeon: BJolaine Artist MD;  [facility];  Service: Cardiovascular;  Laterality: N/A;   Current Outpatient Medications on File Prior to Visit  Medication Sig Dispense Refill   ACCU-CHEK AVIVA PLUS test strip USE TO CHECK BLOOD SUGAR TWICE DAILY E11.9 100 strip 12  Accu-Chek FastClix Lancets MISC USE TO CHECK BLOOD SUGAR TWICE DAILY E11.9 102 each 12   acetaminophen (TYLENOL) 500 MG tablet Take 500-1,000 mg by mouth daily as needed for moderate pain or headache.     aspirin 81 MG EC tablet Take 81 mg by mouth at bedtime.      blood glucose meter kit and supplies Dispense based on patient and insurance preference. Use up to four times daily as directed. (FOR ICD-10 E10.9, E11.9). 1 each 0   Capsaicin 0.1 % CREA Apply topically as needed.      clopidogrel (PLAVIX) 75 MG tablet TAKE 1 TABLET BY MOUTH EVERY DAY 90 tablet 1   levothyroxine (SYNTHROID) 75 MCG tablet Take 1 tablet (75 mcg total) by mouth daily. 90 tablet 3   mometasone (ELOCON) 0.1 % ointment Apply topically daily. 45 g 0   Multiple Vitamin (MULTIVITAMIN WITH MINERALS) TABS tablet Take 1 tablet by mouth daily.     rosuvastatin (CRESTOR) 20 MG tablet Take 1 tablet (20 mg total) by mouth daily. 90 tablet 3   Semaglutide,0.25 or 0.5MG/DOS, (OZEMPIC, 0.25 OR 0.5 MG/DOSE,) 2 MG/1.5ML SOPN Inject 0.5 mg into the skin once a week. 1.5 mL 3   vitamin B-12 (CYANOCOBALAMIN) 1000 MCG tablet Take 1,000 mcg by mouth daily.     solifenacin (VESICARE) 10 MG tablet Take 1 tablet (10 mg total) by mouth daily. (Patient not taking: Reported on 03/25/2022) 90 tablet 1   No current facility-administered medications on file prior to visit.   Allergies  Allergen Reactions   Bee Venom Swelling   Ceclor [Cefaclor] Hives   Ciprofloxacin Hives   Jardiance [Empagliflozin]    Metformin And Related Diarrhea    upsesomach   Social History   Socioeconomic History   Marital status: Married    Spouse name: [name]   Number of children: 0   Years of education: 12   Highest education level: Not on file  Occupational History    Comment: retired  Tobacco Use   Smoking status: Never   Smokeless tobacco: Never  Vaping Use   Vaping Use: Never used  Substance and Sexual Activity   Alcohol use: Yes    Alcohol/week: 2.0 standard drinks of alcohol    Types: 1 Glasses of wine, 1 Cans of beer per week    Comment: 1 beer/week   Drug use: No   Sexual activity: Not Currently  Other Topics Concern   Not on file  Social History Narrative   Retired. Lives with wife, [name].    Caffeine- coffee, 1 daily   Social Determinants of Health   Financial Resource Strain: Low Risk  (05/20/2021)   Overall Financial Resource Strain (CARDIA)    Difficulty of Paying Living Expenses: Not hard at all  Food  Insecurity: No Food Insecurity (05/20/2021)   Hunger Vital Sign    Worried About Running Out of Food in the Last Year: Never true    Ran Out of Food in the Last Year: Never true  Transportation Needs: No Transportation Needs (05/20/2021)   PRAPARE - Hydrologist (Medical): No    Lack of Transportation (Non-Medical): No  Physical Activity: Insufficiently Active (05/20/2021)   Exercise Vital Sign    Days of Exercise per Week: 5 days    Minutes of Exercise per Session: 20 min  Stress: No Stress Concern Present (05/20/2021)   [place]    Feeling of Stress : Only  a little  Social Connections: Socially Integrated (05/20/2021)   Social Connection and Isolation Panel [NHANES]    Frequency of Communication with Friends and Family: More than three times a week    Frequency of Social Gatherings with Friends and Family: More than three times a week    Attends Religious Services: More than 4 times per year    Active Member of [employer] or Organizations: Yes    Attends Music therapist: More than 4 times per year    Marital Status: Married  Human resources officer Violence: Not At Risk (05/20/2021)   Humiliation, Afraid, Rape, and Kick questionnaire    Fear of Current or Ex-Partner: No    Emotionally Abused: No    Physically Abused: No    Sexually Abused: No   Family History  Problem Relation Age of Onset   Diabetes Mellitus II Father    Heart failure Father    Kidney failure Father    Heart attack Mother       Review of Systems  All other systems reviewed and are negative.      Objective:   Physical Exam Vitals reviewed.  Constitutional:      General: He is not in acute distress.    Appearance: He is well-developed. He is not diaphoretic.  HENT:     Head: Normocephalic and atraumatic.     Right Ear: External ear normal.     Left Ear: External ear normal.     Nose: Nose normal.      Mouth/Throat:     Pharynx: No oropharyngeal exudate.  Eyes:     General: No visual field deficit or scleral icterus.       Right eye: No discharge.        Left eye: No discharge.     Conjunctiva/sclera: Conjunctivae normal.     Pupils: Pupils are equal, round, and reactive to light.  Neck:     Thyroid: No thyromegaly.     Vascular: No JVD.     Trachea: No tracheal deviation.  Cardiovascular:     Rate and Rhythm: Normal rate and regular rhythm.     Heart sounds: Normal heart sounds. No murmur heard.    No friction rub. No gallop.  Pulmonary:     Effort: Pulmonary effort is normal. No respiratory distress.     Breath sounds: Normal breath sounds. No stridor. No wheezing or rales.  Chest:     Chest wall: No tenderness.  Abdominal:     General: Bowel sounds are normal. There is no distension.     Palpations: Abdomen is soft. There is no mass.     Tenderness: There is no abdominal tenderness. There is no guarding or rebound.  Musculoskeletal:        General: No tenderness.     Cervical back: Normal range of motion and neck supple.  Lymphadenopathy:     Cervical: No cervical adenopathy.  Skin:    General: Skin is warm.     Coloration: Skin is not pale.     Findings: No erythema or rash.  Neurological:     General: No focal deficit present.     Mental Status: He is alert and oriented to person, place, and time.     Cranial Nerves: No cranial nerve deficit, dysarthria or facial asymmetry.     Sensory: Sensation is intact. No sensory deficit.     Motor: Motor function is intact. No weakness, tremor, atrophy, abnormal muscle tone, seizure activity or pronator  drift.     Coordination: Coordination is intact. Romberg sign negative. Coordination normal. Finger-Nose-Finger Test and Heel to [facility] Test normal.     Gait: Gait is intact. Gait and tandem walk normal.     Deep Tendon Reflexes: Reflexes are normal and symmetric.           Assessment & Plan:  Colon cancer screening -  Plan: Cologuard  Hypothyroidism, unspecified type  Coronary artery disease involving native coronary artery of native heart with angina pectoris ([place])  Controlled type 2 diabetes mellitus with complication, without long-term current use of insulin ([place])  Encounter for Medicare annual wellness exam First I recommended a COVID vaccination.  This would update all of his vaccines.  Second we discussed colon cancer screening and he would like to proceed with Cologuard.  Third I will give the patient a prescription for Viagra for erectile dysfunction.  He recently had a CT scan of his chest that revealed a 4.1 cm aortic aneurysm.  This needs to be monitored annually.  I reviewed his most recent lab work which included a hemoglobin A1c less than 7, LDL cholesterol below 70, stage IIIa chronic kidney disease but was otherwise normal.  I am reassured by his good blood work and his excellent blood pressure.

## 2022-04-07 ENCOUNTER — Other Ambulatory Visit: Payer: Self-pay | Admitting: Family Medicine

## 2022-04-07 NOTE — Telephone Encounter (Signed)
Requested Prescriptions  Pending Prescriptions Disp Refills   levothyroxine (SYNTHROID) 75 MCG tablet [Pharmacy Med Name: LEVOTHYROXINE 75 MCG TABLET] 90 tablet 3    Sig: TAKE 1 TABLET BY MOUTH EVERY DAY     Endocrinology:  Hypothyroid Agents Passed - 04/07/2022  2:44 AM      Passed - TSH in normal range and within 360 days    TSH  Date Value Ref Range Status  02/24/2022 1.22 0.40 - 4.50 mIU/L Final         Passed - Valid encounter within last 12 months    Recent Outpatient Visits           11 months ago New daily persistent headache   [place] Pickard, Cammie Mcgee, MD   1 year ago Rib pain on right side   [place] Pickard, Cammie Mcgee, MD   1 year ago Dyspnea on exertion   [place] Susy Frizzle, MD   2 years ago Dyspnea on exertion   [place] Susy Frizzle, MD   2 years ago Memory loss   [place], Cammie Mcgee, MD       Future Appointments             In 1 month Sharon, MD [place]. [facility], [place]

## 2022-04-21 LAB — COLOGUARD: COLOGUARD: NEGATIVE

## 2022-05-13 ENCOUNTER — Other Ambulatory Visit: Payer: Self-pay | Admitting: Cardiology

## 2022-05-20 ENCOUNTER — Ambulatory Visit (INDEPENDENT_AMBULATORY_CARE_PROVIDER_SITE_OTHER): Payer: No Typology Code available for payment source | Admitting: Family Medicine

## 2022-05-20 ENCOUNTER — Ambulatory Visit: Payer: Medicare HMO | Admitting: Cardiology

## 2022-05-20 VITALS — BP 132/72 | HR 83 | Ht 69.5 in | Wt 174.0 lb

## 2022-05-20 DIAGNOSIS — K5792 Diverticulitis of intestine, part unspecified, without perforation or abscess without bleeding: Secondary | ICD-10-CM

## 2022-05-20 DIAGNOSIS — K921 Melena: Secondary | ICD-10-CM | POA: Diagnosis not present

## 2022-05-20 LAB — HEMOGLOBIN, FINGERSTICK: POC HEMOGLOBIN: 15.1 g/dL (ref 13.0–17.0)

## 2022-05-20 MED ORDER — AMOXICILLIN-POT CLAVULANATE 875-125 MG PO TABS: 1.0000 | ORAL_TABLET | Freq: Two times a day (BID) | ORAL | 0 refills | Status: DC

## 2022-05-20 NOTE — Progress Notes (Signed)
Subjective:    Patient ID: Caleb Butler, male    DOB: May 20, 1946, 76 y.o.   MRN: [id number]  Patient is a very pleasant 76 year old Caucasian gentleman who presents today with blood in stool.  Initially states that his stool was black.  Since that time he has had a large amount of blood colored stool or rust colored stool.  He denies any fever but he is having diarrhea and he is also developed left lower quadrant abdominal pain.  Today on examination he is tender to palpation in the left lower quadrant.  We performed a fingerstick hemoglobin and his hemoglobin was greater than 15.  He is on aspirin and Plavix given his history of coronary artery disease.  He denies any angina or shortness of breath but he does report feeling weak and tired and occasionally dizzy.  His blood pressure today however is stable at 132/72 and his heart rate is normal.  Past Medical History:  Diagnosis Date   Anxiety    Arthritis    "my back is loaded w/it" (01/06/2017)   CAD (coronary artery disease)    80% ostial lesion in L circumflex (2017)   Chronic lower back pain    GERD (gastroesophageal reflux disease)    Hiatal hernia    High cholesterol    Hypothyroidism    IBS (irritable bowel syndrome)    Numbness and tingling of right lower extremity    "since 11/30/2016" (01/06/2017)   PMR (polymyalgia rheumatica) ([place])    Type 2 diabetes mellitus ([place])    Past Surgical History:  Procedure Laterality Date   CARDIAC CATHETERIZATION  06/2016   CORONARY ANGIOPLASTY WITH STENT PLACEMENT  01/06/2017   CORONARY STENT INTERVENTION N/A 01/06/2017   Procedure: CORONARY STENT INTERVENTION;  Surgeon: Sherren Mocha, MD;  Location: [facility];  Service: Cardiovascular;  Laterality: N/A;   FINGER SURGERY Left 1984   ring finger reattached.    FOREARM FRACTURE SURGERY Right 2004   "has 3 rods and 22 screws in it"   INGUINAL HERNIA REPAIR Left    RIGHT/LEFT HEART CATH AND CORONARY ANGIOGRAPHY N/A 07/07/2016    Procedure: Right/Left Heart Cath and Coronary Angiography;  Surgeon: Jolaine Artist, MD;  Location: [facility];  Service: Cardiovascular;  Laterality: N/A;   RIGHT/LEFT HEART CATH AND CORONARY ANGIOGRAPHY N/A 03/13/2018   Procedure: RIGHT/LEFT HEART CATH AND CORONARY ANGIOGRAPHY;  Surgeon: Jolaine Artist, MD;  Location: [facility];  Service: Cardiovascular;  Laterality: N/A;   Current Outpatient Medications on File Prior to Visit  Medication Sig Dispense Refill   ACCU-CHEK AVIVA PLUS test strip USE TO CHECK BLOOD SUGAR TWICE DAILY E11.9 100 strip 12   Accu-Chek FastClix Lancets MISC USE TO CHECK BLOOD SUGAR TWICE DAILY E11.9 102 each 12   acetaminophen (TYLENOL) 500 MG tablet Take 500-1,000 mg by mouth daily as needed for moderate pain or headache.     aspirin 81 MG EC tablet Take 81 mg by mouth at bedtime.      blood glucose meter kit and supplies Dispense based on patient and insurance preference. Use up to four times daily as directed. (FOR ICD-10 E10.9, E11.9). 1 each 0   Capsaicin 0.1 % CREA Apply topically as needed.     clopidogrel (PLAVIX) 75 MG tablet TAKE 1 TABLET BY MOUTH EVERY DAY 90 tablet 1   levothyroxine (SYNTHROID) 75 MCG tablet TAKE 1 TABLET BY MOUTH EVERY DAY 90 tablet 3   mometasone (ELOCON) 0.1 %  ointment Apply topically daily. 45 g 0   Multiple Vitamin (MULTIVITAMIN WITH MINERALS) TABS tablet Take 1 tablet by mouth daily.     rosuvastatin (CRESTOR) 20 MG tablet TAKE 1 TABLET BY MOUTH EVERY DAY 90 tablet 3   Semaglutide,0.25 or 0.5MG/DOS, (OZEMPIC, 0.25 OR 0.5 MG/DOSE,) 2 MG/1.5ML SOPN Inject 0.5 mg into the skin once a week. 1.5 mL 3   sildenafil (VIAGRA) 100 MG tablet Take 0.5-1 tablets (50-100 mg total) by mouth daily as needed for erectile dysfunction. 5 tablet 11   solifenacin (VESICARE) 10 MG tablet Take 1 tablet (10 mg total) by mouth daily. 90 tablet 1   vitamin B-12 (CYANOCOBALAMIN) 1000 MCG tablet Take 1,000 mcg by mouth daily.     No  current facility-administered medications on file prior to visit.   Allergies  Allergen Reactions   Bee Venom Swelling   Ceclor [Cefaclor] Hives   Ciprofloxacin Hives   Jardiance [Empagliflozin]    Metformin And Related Diarrhea    upsesomach   Social History   Socioeconomic History   Marital status: Married    Spouse name: [name]   Number of children: 0   Years of education: 12   Highest education level: Not on file  Occupational History    Comment: retired  Tobacco Use   Smoking status: Never   Smokeless tobacco: Never  Vaping Use   Vaping Use: Never used  Substance and Sexual Activity   Alcohol use: Yes    Alcohol/week: 2.0 standard drinks of alcohol    Types: 1 Glasses of wine, 1 Cans of beer per week    Comment: 1 beer/week   Drug use: No   Sexual activity: Not Currently  Other Topics Concern   Not on file  Social History Narrative   Retired. Lives with wife, [name].    Caffeine- coffee, 1 daily   Social Determinants of Health   Financial Resource Strain: Low Risk  (05/20/2021)   Overall Financial Resource Strain (CARDIA)    Difficulty of Paying Living Expenses: Not hard at all  Food Insecurity: No Food Insecurity (05/20/2021)   Hunger Vital Sign    Worried About Running Out of Food in the Last Year: Never true    Ran Out of Food in the Last Year: Never true  Transportation Needs: No Transportation Needs (05/20/2021)   PRAPARE - Hydrologist (Medical): No    Lack of Transportation (Non-Medical): No  Physical Activity: Insufficiently Active (05/20/2021)   Exercise Vital Sign    Days of Exercise per Week: 5 days    Minutes of Exercise per Session: 20 min  Stress: No Stress Concern Present (05/20/2021)   [place]    Feeling of Stress : Only a little  Social Connections: Socially Integrated (05/20/2021)   Social Connection and Isolation Panel [NHANES]     Frequency of Communication with Friends and Family: More than three times a week    Frequency of Social Gatherings with Friends and Family: More than three times a week    Attends Religious Services: More than 4 times per year    Active Member of [employer] or Organizations: Yes    Attends Archivist Meetings: More than 4 times per year    Marital Status: Married  Human resources officer Violence: Not At Risk (05/20/2021)   Humiliation, Afraid, Rape, and Kick questionnaire    Fear of Current or Ex-Partner: No    Emotionally Abused: No  Physically Abused: No    Sexually Abused: No   Family History  Problem Relation Age of Onset   Diabetes Mellitus II Father    Heart failure Father    Kidney failure Father    Heart attack Mother       Review of Systems  All other systems reviewed and are negative.      Objective:   Physical Exam Vitals reviewed.  Constitutional:      General: He is not in acute distress.    Appearance: He is well-developed. He is not diaphoretic.  HENT:     Head: Normocephalic and atraumatic.     Right Ear: External ear normal.     Left Ear: External ear normal.     Nose: Nose normal.     Mouth/Throat:     Pharynx: No oropharyngeal exudate.  Eyes:     General: No visual field deficit or scleral icterus.       Right eye: No discharge.        Left eye: No discharge.     Conjunctiva/sclera: Conjunctivae normal.     Pupils: Pupils are equal, round, and reactive to light.  Neck:     Thyroid: No thyromegaly.     Vascular: No JVD.     Trachea: No tracheal deviation.  Cardiovascular:     Rate and Rhythm: Normal rate and regular rhythm.     Heart sounds: Normal heart sounds. No murmur heard.    No friction rub. No gallop.  Pulmonary:     Effort: Pulmonary effort is normal. No respiratory distress.     Breath sounds: Normal breath sounds. No stridor. No wheezing or rales.  Chest:     Chest wall: No tenderness.  Abdominal:     General: Bowel sounds are  normal. There is no distension.     Palpations: Abdomen is soft. There is no mass.     Tenderness: There is abdominal tenderness. There is no guarding or rebound.    Musculoskeletal:        General: No tenderness.     Cervical back: Normal range of motion and neck supple.  Lymphadenopathy:     Cervical: No cervical adenopathy.  Skin:    General: Skin is warm.     Coloration: Skin is not pale.     Findings: No erythema or rash.  Neurological:     General: No focal deficit present.     Mental Status: He is alert and oriented to person, place, and time.     Cranial Nerves: No cranial nerve deficit, dysarthria or facial asymmetry.     Sensory: Sensation is intact. No sensory deficit.     Motor: Motor function is intact. No weakness, tremor, atrophy, abnormal muscle tone, seizure activity or pronator drift.     Coordination: Coordination is intact. Romberg sign negative. Coordination normal. Finger-Nose-Finger Test and Heel to [facility] Test normal.     Gait: Gait is intact. Gait and tandem walk normal.     Deep Tendon Reflexes: Reflexes are normal and symmetric.           Assessment & Plan:  Blood in stool - Plan: Hemoglobin, fingerstick  Diverticulitis - Plan: BASIC METABOLIC PANEL WITH GFR, CBC with Differential/Platelet, Sedimentation rate  I am reassured by the patient's hemoglobin today in the office.  My suspicion is that he has diverticulitis with a resultant diverticular bleed.  He states that he has a history of diverticulitis.  Will treat diverticulitis with Augmentin 875  mg twice daily for 10 days.  I want him to temporarily hold aspirin and Plavix to help with hemostasis of the bleeding will hopefully stop.  If the bleeding worsens or the pain intensifies he is to go to the emergency room immediately for further evaluation.  Patient and wife are comfortable with this plan.  Recheck next week or sooner if worsening

## 2022-05-21 LAB — CBC WITH DIFFERENTIAL/PLATELET
Absolute Monocytes: 685 cells/uL (ref 200–950)
Basophils Absolute: 75 cells/uL (ref 0–200)
Basophils Relative: 0.7 %
Eosinophils Absolute: 225 cells/uL (ref 15–500)
Eosinophils Relative: 2.1 %
HCT: 43.1 % (ref 38.5–50.0)
Hemoglobin: 14.7 g/dL (ref 13.2–17.1)
Lymphs Abs: 1637 cells/uL (ref 850–3900)
MCH: 32.7 pg (ref 27.0–33.0)
MCHC: 34.1 g/dL (ref 32.0–36.0)
MCV: 96 fL (ref 80.0–100.0)
MPV: 11.7 fL (ref 7.5–12.5)
Monocytes Relative: 6.4 %
Neutro Abs: 8079 cells/uL — ABNORMAL HIGH (ref 1500–7800)
Neutrophils Relative %: 75.5 %
Platelets: 205 10*3/uL (ref 140–400)
RBC: 4.49 10*6/uL (ref 4.20–5.80)
RDW: 12.6 % (ref 11.0–15.0)
Total Lymphocyte: 15.3 %
WBC: 10.7 10*3/uL (ref 3.8–10.8)

## 2022-05-21 LAB — BASIC METABOLIC PANEL WITH GFR
BUN/Creatinine Ratio: 25 (calc) — ABNORMAL HIGH (ref 6–22)
BUN: 31 mg/dL — ABNORMAL HIGH (ref 7–25)
CO2: 24 mmol/L (ref 20–32)
Calcium: 9.8 mg/dL (ref 8.6–10.3)
Chloride: 107 mmol/L (ref 98–110)
Creat: 1.22 mg/dL (ref 0.70–1.28)
Glucose, Bld: 161 mg/dL — ABNORMAL HIGH (ref 65–99)
Potassium: 4 mmol/L (ref 3.5–5.3)
Sodium: 141 mmol/L (ref 135–146)
eGFR: 62 mL/min/{1.73_m2} (ref >=60)

## 2022-05-21 LAB — SEDIMENTATION RATE: Sed Rate: 2 mm/h (ref 0–20)

## 2022-05-25 ENCOUNTER — Telehealth: Payer: Self-pay | Admitting: Family Medicine

## 2022-05-25 NOTE — Telephone Encounter (Signed)
Patient called for lab results; requesting call back.  Please advise at [phone].

## 2022-05-27 ENCOUNTER — Ambulatory Visit (INDEPENDENT_AMBULATORY_CARE_PROVIDER_SITE_OTHER): Payer: No Typology Code available for payment source | Admitting: Family Medicine

## 2022-05-27 ENCOUNTER — Encounter: Payer: Self-pay | Admitting: Family Medicine

## 2022-05-27 VITALS — BP 114/62 | HR 77 | Ht 69.5 in | Wt 174.0 lb

## 2022-05-27 DIAGNOSIS — K5792 Diverticulitis of intestine, part unspecified, without perforation or abscess without bleeding: Secondary | ICD-10-CM

## 2022-05-27 LAB — CBC WITH DIFFERENTIAL/PLATELET
Absolute Monocytes: 539 cells/uL (ref 200–950)
Basophils Absolute: 19 cells/uL (ref 0–200)
Basophils Relative: 0.2 %
Eosinophils Absolute: 260 cells/uL (ref 15–500)
Eosinophils Relative: 2.8 %
HCT: 44.1 % (ref 38.5–50.0)
Hemoglobin: 15.3 g/dL (ref 13.2–17.1)
Lymphs Abs: 1488 cells/uL (ref 850–3900)
MCH: 33.2 pg — ABNORMAL HIGH (ref 27.0–33.0)
MCHC: 34.7 g/dL (ref 32.0–36.0)
MCV: 95.7 fL (ref 80.0–100.0)
MPV: 11.4 fL (ref 7.5–12.5)
Monocytes Relative: 5.8 %
Neutro Abs: 6994 cells/uL (ref 1500–7800)
Neutrophils Relative %: 75.2 %
Platelets: 204 10*3/uL (ref 140–400)
RBC: 4.61 10*6/uL (ref 4.20–5.80)
RDW: 12.5 % (ref 11.0–15.0)
Total Lymphocyte: 16 %
WBC: 9.3 10*3/uL (ref 3.8–10.8)

## 2022-05-27 NOTE — Progress Notes (Signed)
Subjective:    Patient ID: Caleb Butler, male    DOB: 07-Oct-1945, 76 y.o.   MRN: [id number] 05/20/22 Patient is a very pleasant 76 year old Caucasian gentleman who presents today with blood in stool.  Initially states that his stool was black.  Since that time he has had a large amount of blood colored stool or rust colored stool.  He denies any fever but he is having diarrhea and he is also developed left lower quadrant abdominal pain.  Today on examination he is tender to palpation in the left lower quadrant.  We performed a fingerstick hemoglobin and his hemoglobin was greater than 15.  He is on aspirin and Plavix given his history of coronary artery disease.  He denies any angina or shortness of breath but he does report feeling weak and tired and occasionally dizzy.  His blood pressure today however is stable at 132/72 and his heart rate is normal.  At that time, my plan was:  I am reassured by the patient's hemoglobin today in the office.  My suspicion is that he has diverticulitis with a resultant diverticular bleed.  He states that he has a history of diverticulitis.  Will treat diverticulitis with Augmentin 875 mg twice daily for 10 days.  I want him to temporarily hold aspirin and Plavix to help with hemostasis of the bleeding will hopefully stop.  If the bleeding worsens or the pain intensifies he is to go to the emergency room immediately for further evaluation.  Patient and wife are comfortable with this plan.  Recheck next week or sooner if worsening  05/27/22 Patient is here today for follow-up.  He states that last week, the pain in his left lower quadrant was a 7 to an 8 on a scale of 1-10 pain has improved and is now about 2 and a scale 1-10.  He states that he still little sore" moderate but much better.  He denies any fevers or chills.  He denies any diarrhea.  He has not seen any further blood in his stool.  He denies any frank hematochezia or melena.  He denies any nausea  or vomiting.  Past Medical History:  Diagnosis Date   Anxiety    Arthritis    "my back is loaded w/it" (01/06/2017)   CAD (coronary artery disease)    80% ostial lesion in L circumflex (2017)   Chronic lower back pain    GERD (gastroesophageal reflux disease)    Hiatal hernia    High cholesterol    Hypothyroidism    IBS (irritable bowel syndrome)    Numbness and tingling of right lower extremity    "since 11/30/2016" (01/06/2017)   PMR (polymyalgia rheumatica) ([place])    Type 2 diabetes mellitus ([place])    Past Surgical History:  Procedure Laterality Date   CARDIAC CATHETERIZATION  06/2016   CORONARY ANGIOPLASTY WITH STENT PLACEMENT  01/06/2017   CORONARY STENT INTERVENTION N/A 01/06/2017   Procedure: CORONARY STENT INTERVENTION;  Surgeon: Sherren Mocha, MD;  Location: [facility];  Service: Cardiovascular;  Laterality: N/A;   FINGER SURGERY Left 1984   ring finger reattached.    FOREARM FRACTURE SURGERY Right 2004   "has 3 rods and 22 screws in it"   INGUINAL HERNIA REPAIR Left    RIGHT/LEFT HEART CATH AND CORONARY ANGIOGRAPHY N/A 07/07/2016   Procedure: Right/Left Heart Cath and Coronary Angiography;  Surgeon: Jolaine Artist, MD;  Location: [facility];  Service: Cardiovascular;  Laterality: N/A;  RIGHT/LEFT HEART CATH AND CORONARY ANGIOGRAPHY N/A 03/13/2018   Procedure: RIGHT/LEFT HEART CATH AND CORONARY ANGIOGRAPHY;  Surgeon: Jolaine Artist, MD;  Location: [facility];  Service: Cardiovascular;  Laterality: N/A;   Current Outpatient Medications on File Prior to Visit  Medication Sig Dispense Refill   ACCU-CHEK AVIVA PLUS test strip USE TO CHECK BLOOD SUGAR TWICE DAILY E11.9 100 strip 12   Accu-Chek FastClix Lancets MISC USE TO CHECK BLOOD SUGAR TWICE DAILY E11.9 102 each 12   acetaminophen (TYLENOL) 500 MG tablet Take 500-1,000 mg by mouth daily as needed for moderate pain or headache.     amoxicillin-clavulanate (AUGMENTIN) 875-125 MG tablet Take 1  tablet by mouth 2 (two) times daily. 20 tablet 0   aspirin 81 MG EC tablet Take 81 mg by mouth at bedtime.      blood glucose meter kit and supplies Dispense based on patient and insurance preference. Use up to four times daily as directed. (FOR ICD-10 E10.9, E11.9). 1 each 0   Capsaicin 0.1 % CREA Apply topically as needed.     clopidogrel (PLAVIX) 75 MG tablet TAKE 1 TABLET BY MOUTH EVERY DAY 90 tablet 1   levothyroxine (SYNTHROID) 75 MCG tablet TAKE 1 TABLET BY MOUTH EVERY DAY 90 tablet 3   mometasone (ELOCON) 0.1 % ointment Apply topically daily. 45 g 0   Multiple Vitamin (MULTIVITAMIN WITH MINERALS) TABS tablet Take 1 tablet by mouth daily.     rosuvastatin (CRESTOR) 20 MG tablet TAKE 1 TABLET BY MOUTH EVERY DAY 90 tablet 3   Semaglutide,0.25 or 0.5MG/DOS, (OZEMPIC, 0.25 OR 0.5 MG/DOSE,) 2 MG/1.5ML SOPN Inject 0.5 mg into the skin once a week. 1.5 mL 3   sildenafil (VIAGRA) 100 MG tablet Take 0.5-1 tablets (50-100 mg total) by mouth daily as needed for erectile dysfunction. 5 tablet 11   solifenacin (VESICARE) 10 MG tablet Take 1 tablet (10 mg total) by mouth daily. 90 tablet 1   vitamin B-12 (CYANOCOBALAMIN) 1000 MCG tablet Take 1,000 mcg by mouth daily.     No current facility-administered medications on file prior to visit.   Allergies  Allergen Reactions   Bee Venom Swelling   Ceclor [Cefaclor] Hives   Ciprofloxacin Hives   Jardiance [Empagliflozin]    Metformin And Related Diarrhea    upsesomach   Social History   Socioeconomic History   Marital status: Married    Spouse name: [name]   Number of children: 0   Years of education: 12   Highest education level: Not on file  Occupational History    Comment: retired  Tobacco Use   Smoking status: Never   Smokeless tobacco: Never  Vaping Use   Vaping Use: Never used  Substance and Sexual Activity   Alcohol use: Yes    Alcohol/week: 2.0 standard drinks of alcohol    Types: 1 Glasses of wine, 1 Cans of beer per week     Comment: 1 beer/week   Drug use: No   Sexual activity: Not Currently  Other Topics Concern   Not on file  Social History Narrative   Retired. Lives with wife, [name].    Caffeine- coffee, 1 daily   Social Determinants of Health   Financial Resource Strain: Low Risk  (05/20/2021)   Overall Financial Resource Strain (CARDIA)    Difficulty of Paying Living Expenses: Not hard at all  Food Insecurity: No Food Insecurity (05/20/2021)   Hunger Vital Sign    Worried About Running Out of Food in  the Last Year: Never true    [place] in the Last Year: Never true  Transportation Needs: No Transportation Needs (05/20/2021)   PRAPARE - Hydrologist (Medical): No    Lack of Transportation (Non-Medical): No  Physical Activity: Insufficiently Active (05/20/2021)   Exercise Vital Sign    Days of Exercise per Week: 5 days    Minutes of Exercise per Session: 20 min  Stress: No Stress Concern Present (05/20/2021)   [place]    Feeling of Stress : Only a little  Social Connections: Socially Integrated (05/20/2021)   Social Connection and Isolation Panel [NHANES]    Frequency of Communication with Friends and Family: More than three times a week    Frequency of Social Gatherings with Friends and Family: More than three times a week    Attends Religious Services: More than 4 times per year    Active Member of [employer] or Organizations: Yes    Attends Music therapist: More than 4 times per year    Marital Status: Married  Human resources officer Violence: Not At Risk (05/20/2021)   Humiliation, Afraid, Rape, and Kick questionnaire    Fear of Current or Ex-Partner: No    Emotionally Abused: No    Physically Abused: No    Sexually Abused: No   Family History  Problem Relation Age of Onset   Diabetes Mellitus II Father    Heart failure Father    Kidney failure Father    Heart attack  Mother       Review of Systems  All other systems reviewed and are negative.      Objective:   Physical Exam Vitals reviewed.  Constitutional:      General: He is not in acute distress.    Appearance: He is well-developed. He is not diaphoretic.  HENT:     Head: Normocephalic and atraumatic.     Right Ear: External ear normal.     Left Ear: External ear normal.     Nose: Nose normal.     Mouth/Throat:     Pharynx: No oropharyngeal exudate.  Eyes:     General: No visual field deficit or scleral icterus.       Right eye: No discharge.        Left eye: No discharge.     Conjunctiva/sclera: Conjunctivae normal.     Pupils: Pupils are equal, round, and reactive to light.  Neck:     Thyroid: No thyromegaly.     Vascular: No JVD.     Trachea: No tracheal deviation.  Cardiovascular:     Rate and Rhythm: Normal rate and regular rhythm.     Heart sounds: Normal heart sounds. No murmur heard.    No friction rub. No gallop.  Pulmonary:     Effort: Pulmonary effort is normal. No respiratory distress.     Breath sounds: Normal breath sounds. No stridor. No wheezing or rales.  Chest:     Chest wall: No tenderness.  Abdominal:     General: Bowel sounds are normal. There is no distension.     Palpations: Abdomen is soft. There is no mass.     Tenderness: There is abdominal tenderness. There is no guarding or rebound.    Musculoskeletal:        General: No tenderness.     Cervical back: Normal range of motion and neck supple.  Lymphadenopathy:  Cervical: No cervical adenopathy.  Skin:    General: Skin is warm.     Coloration: Skin is not pale.     Findings: No erythema or rash.  Neurological:     General: No focal deficit present.     Mental Status: He is alert and oriented to person, place, and time.     Cranial Nerves: No cranial nerve deficit, dysarthria or facial asymmetry.     Sensory: Sensation is intact. No sensory deficit.     Motor: Motor function is intact. No  weakness, tremor, atrophy, abnormal muscle tone, seizure activity or pronator drift.     Coordination: Coordination is intact. Romberg sign negative. Coordination normal. Finger-Nose-Finger Test and Heel to [facility] Test normal.     Gait: Gait is intact. Gait and tandem walk normal.     Deep Tendon Reflexes: Reflexes are normal and symmetric.           Assessment & Plan:  Diverticulitis - Plan: CBC with Differential/Platelet Patient is still minimally tender to palpation in the left lower quadrant but this is much better than last week.  I believe the diverticulitis was the likely culprit and he is clinically improving.  I will repeat a CBC today to ensure that his hemoglobin has not dropped precipitously.  If his hemoglobin is stable I think he can safely resume aspirin and Plavix.  We discussed whether to order a CAT scan today to confirm the diagnosis and the patient is so much better that we agreed to simply monitor him clinically.  As long as his symptoms continue to improve and he is pain-free next week I feel that no further workup is necessary at this time and the bleeding has subsided.  Of note he had a negative Cologuard in November therefore his risk of colon cancer is very low.  If the bleeding persists, I feel that we need to get a colonoscopy to confirm the source of the bleeding.  Patient will notify me if the bleeding returns

## 2022-06-13 ENCOUNTER — Ambulatory Visit: Payer: Medicare HMO | Admitting: Cardiology

## 2022-06-28 ENCOUNTER — Ambulatory Visit: Payer: No Typology Code available for payment source | Attending: Cardiology | Admitting: Cardiology

## 2022-06-28 ENCOUNTER — Encounter: Payer: Self-pay | Admitting: Cardiology

## 2022-06-28 VITALS — BP 122/66 | HR 79 | Ht 69.0 in | Wt 173.0 lb

## 2022-06-28 DIAGNOSIS — I7121 Aneurysm of the ascending aorta, without rupture: Secondary | ICD-10-CM | POA: Diagnosis not present

## 2022-06-28 DIAGNOSIS — E782 Mixed hyperlipidemia: Secondary | ICD-10-CM | POA: Diagnosis not present

## 2022-06-28 DIAGNOSIS — R42 Dizziness and giddiness: Secondary | ICD-10-CM | POA: Diagnosis not present

## 2022-06-28 DIAGNOSIS — I2510 Atherosclerotic heart disease of native coronary artery without angina pectoris: Secondary | ICD-10-CM | POA: Diagnosis not present

## 2022-06-28 NOTE — Patient Instructions (Addendum)
Medication Instructions:  Continue all current medications.   Labwork: none  Testing/Procedures: none  Follow-Up: 6 months   Any Other Special Instructions Will Be Listed Below (If Applicable). Increase water intake to 2-3 Liters per day   If you need a refill on your cardiac medications before your next appointment, please call your pharmacy.

## 2022-06-28 NOTE — Progress Notes (Addendum)
Clinical Summary Caleb Butler is a 77 y.o.male seen today for follow up of the following medical problems.    1.CAD - PCI to ostial LCX in 2018, given stent location indefinitite DAPT was recommended - relook caht 02/2018 with stable findings, does have residual 90% diag disease that recs were for medical management.  - 02/2018 echo LVEF 55-60%, no WMAs, grade I dd, mild MR, mild AI   - 03/2020 nuclear stress: apical scar with mild ischemia - 03/2020 echo LVEF 55-60%, no WMAs, indet diasotlic fxn, mild to mod MR, mild AI, ascending aorta 31m       - pcp recently held ASA/plavix due to GI bleeding but back on now, GI bleeding has resolved.  - no specific chest pains. Chronic dyspnea unchanged. [facility] is not [facility] sure    2. Chronic dyspnea - extensive cardiac and pulmonary workup in the past - symptoms overall unchanged.  - abnormal PFTs - [facility]    01/2022 CT difficult to exclude interstitial disease - chronic SOB unchanged.  - followed by pulmonary Dr MLake Bells ILD mild changes     3. Aortic dilatation - 04/2020 CTA: ascending aorta 3.9 cm - 01/2022 CT high res chest: 4.1 cm ascending aortic aneurysm   4. Hyperlipidemia - compliant with meds, reports recent labs with rheumatology 01/2022 TC 116 HDL 45 TG 187 LDL 45   5. Polymyalgia rheumatica - followed by rheumatology  6. Dizziness - usually occurs when outside doing work, some blurry vision - recent abnormal eye exam - 1 coffee in AM, propel x 1. No other liquids in the day.     Past Medical History:  Diagnosis Date   Anxiety    Arthritis    "my back is loaded w/it" (01/06/2017)   CAD (coronary artery disease)    80% ostial lesion in L circumflex (2017)   Chronic lower back pain    GERD (gastroesophageal reflux disease)    Hiatal hernia    High cholesterol    Hypothyroidism    IBS (irritable bowel syndrome)     Numbness and tingling of right lower extremity    "since 11/30/2016" (01/06/2017)   PMR (polymyalgia rheumatica) ([facility])    Type 2 diabetes mellitus ([facility])      Allergies  Allergen Reactions   Bee Venom Swelling   Ceclor [Cefaclor] Hives   Ciprofloxacin Hives   Jardiance [Empagliflozin]    Metformin And Related Diarrhea    upsesomach     Current Outpatient Medications  Medication Sig Dispense Refill   ACCU-CHEK AVIVA PLUS test strip USE TO CHECK BLOOD SUGAR TWICE DAILY E11.9 100 strip 12   Accu-Chek FastClix Lancets MISC USE TO CHECK BLOOD SUGAR TWICE DAILY E11.9 102 each 12   acetaminophen (TYLENOL) 500 MG tablet Take 500-1,000 mg by mouth daily as needed for moderate pain or headache.     amoxicillin-clavulanate (AUGMENTIN) 875-125 MG tablet Take 1 tablet by mouth 2 (two) times daily. 20 tablet 0   aspirin 81 MG EC tablet Take 81 mg by mouth at bedtime.      blood glucose meter kit and supplies Dispense based on patient and insurance preference. Use up to four times daily as directed. (FOR ICD-10 E10.9, E11.9). 1 each 0   Capsaicin 0.1 % CREA Apply topically as needed.     clopidogrel (PLAVIX) 75 MG tablet TAKE 1 TABLET BY MOUTH EVERY DAY  90 tablet 1   levothyroxine (SYNTHROID) 75 MCG tablet TAKE 1 TABLET BY MOUTH EVERY DAY 90 tablet 3   mometasone (ELOCON) 0.1 % ointment Apply topically daily. 45 g 0   Multiple Vitamin (MULTIVITAMIN WITH MINERALS) TABS tablet Take 1 tablet by mouth daily.     rosuvastatin (CRESTOR) 20 MG tablet TAKE 1 TABLET BY MOUTH EVERY DAY 90 tablet 3   Semaglutide,0.25 or 0.'5MG'$ /DOS, (OZEMPIC, 0.25 OR 0.5 MG/DOSE,) 2 MG/1.5ML SOPN Inject 0.5 mg into the skin once a week. 1.5 mL 3   sildenafil (VIAGRA) 100 MG tablet Take 0.5-1 tablets (50-100 mg total) by mouth daily as needed for erectile dysfunction. 5 tablet 11   solifenacin (VESICARE) 10 MG tablet Take 1 tablet (10 mg total) by mouth daily. 90 tablet 1   vitamin B-12 (CYANOCOBALAMIN) 1000 MCG tablet Take 1,000  mcg by mouth daily.     No current facility-administered medications for this visit.     Past Surgical History:  Procedure Laterality Date   CARDIAC CATHETERIZATION  06/2016   CORONARY ANGIOPLASTY WITH STENT PLACEMENT  01/06/2017   CORONARY STENT INTERVENTION N/A 01/06/2017   Procedure: CORONARY STENT INTERVENTION;  Surgeon: Sherren Mocha, MD;  Location: [facility];  Service: Cardiovascular;  Laterality: N/A;   FINGER SURGERY Left 1984   ring finger reattached.    FOREARM FRACTURE SURGERY Right 2004   "has 3 rods and 22 screws in it"   INGUINAL HERNIA REPAIR Left    RIGHT/LEFT HEART CATH AND CORONARY ANGIOGRAPHY N/A 07/07/2016   Procedure: Right/Left Heart Cath and Coronary Angiography;  Surgeon: Jolaine Artist, MD;  Location: [facility];  Service: Cardiovascular;  Laterality: N/A;   RIGHT/LEFT HEART CATH AND CORONARY ANGIOGRAPHY N/A 03/13/2018   Procedure: RIGHT/LEFT HEART CATH AND CORONARY ANGIOGRAPHY;  Surgeon: Jolaine Artist, MD;  Location: [facility];  Service: Cardiovascular;  Laterality: N/A;     Allergies  Allergen Reactions   Bee Venom Swelling   Ceclor [Cefaclor] Hives   Ciprofloxacin Hives   Jardiance [Empagliflozin]    Metformin And Related Diarrhea    upsesomach      Family History  Problem Relation Age of Onset   Diabetes Mellitus II Father    Heart failure Father    Kidney failure Father    Heart attack Mother      Social History Mr. Bouwens reports that he has never smoked. He has never used smokeless tobacco. Mr. Adkins reports current alcohol use of about 2.0 standard drinks of alcohol per week.   Review of Systems CONSTITUTIONAL: No weight loss, fever, chills, weakness or fatigue.  HEENT: Eyes: No visual loss, blurred vision, double vision or yellow sclerae.No hearing loss, sneezing, congestion, runny nose or sore throat.  SKIN: No rash or itching.  CARDIOVASCULAR: per hpi RESPIRATORY: No shortness of breath,  cough or sputum.  GASTROINTESTINAL: No anorexia, nausea, vomiting or diarrhea. No abdominal pain or blood.  GENITOURINARY: No burning on urination, no polyuria NEUROLOGICAL: No headache, dizziness, syncope, paralysis, ataxia, numbness or tingling in the extremities. No change in bowel or bladder control.  MUSCULOSKELETAL: No muscle, back pain, joint pain or stiffness.  LYMPHATICS: No enlarged nodes. No history of splenectomy.  PSYCHIATRIC: No history of depression or anxiety.  ENDOCRINOLOGIC: No reports of sweating, cold or heat intolerance. No polyuria or polydipsia.  Marland Kitchen   Physical Examination Today's Vitals   06/28/22 1035  BP: 122/66  Pulse: 79  SpO2: 97%  Weight: 173 lb (78.5  kg)  Height: '5\' 9"'$  (1.753 m)   Body mass index is 25.55 kg/m.  Gen: resting comfortably, no acute distress HEENT: no scleral icterus, pupils equal round and reactive, no palptable cervical adenopathy,  CV: RRR, no m/r/g no jvd Resp: Clear to auscultation bilaterally GI: abdomen is soft, non-tender, non-distended, normal bowel sounds, no hepatosplenomegaly MSK: extremities are warm, no edema.  Skin: warm, no rash Neuro:  no focal deficits Psych: appropriate affect   Diagnostic Studies  06/2016 cath The left ventricular ejection fraction is 55-65% by visual estimate. Prox RCA lesion, 40 %stenosed. Ost Cx lesion, 90 %stenosed. Ramus lesion, 90 %stenosed. LM lesion, 20 %stenosed. Mid LAD lesion, 20 %stenosed. 1st Diag lesion, 80 %stenosed.   Findings:   RA = 5 RV = 23/8 PA = 27/4 (16) PCW = 3 Fick cardiac output/index = 5.2/2.6 PVR = 2.5 WU  Ao sat =95% PA sat = 68%, 69%   Assessment: 1. 1-V CAD at ostium of LCX alsi involving ostium of ramus 2. Aneursymal proximal LAD with mild plaque 3. 40% Proximal RCA 4. Normal LVEF with normal hemodynamics   Plan/Discussion:   Case reviewed with Dr. Burt Knack. He has high grade ostial lesion in ostium of LCX also involving Ramus Daisa Stennis. It is a  moderate-sized system which is not completely favorable to PCI. Will proceed with ETT/Myvoview. If high ischemic burden will plan angioplasty +/- stenting. Otherwise proceed with aggressive medical therapy.    12/2016 PCI Successful PCI of severe ostial stenosis in the left circumflex, reducing the stenosis from 90% to 0% with [place] Balloon angioplasty followed by drug-eluting stent implantation.   Recommendations: Aspirin and Plavix for a minimum of 12 months. If tolerated favor long-term DAPT as the stent is positioned at the left main bifurcation.     02/2018 RHC/LHC LM lesion is 20% stenosed. Ramus lesion is 90% stenosed. Prox RCA lesion is 40% stenosed. Ost 1st Diag to 1st Diag lesion is 90% stenosed. Prox LAD lesion is 30% stenosed. Previously placed Ost Cx to Prox Cx stent (unknown type) is widely patent. Ost LM to Mid LM lesion is 20% stenosed.     Findings:   RA = 7 RV = 32/9 PA = 33/10 (20) PCW = 10 Fick cardiac output/index = 5.4/2.7 PVR = 1.9 WU Ao sat = 93% PA sat = 64%, 68%   Assessment: 1. CAD with widely patent stent in ostial LCX 2. Unchanged 90% lesion in ostium of moderate-sized first diagonal 3. Otherwise mild non-obstructive CAD 4. EF 55-60% 6. Normal RHC numbers     02/2018 echo Study Conclusions   - Left ventricle: The cavity size was normal. Wall thickness was    increased in a pattern of mild LVH. Systolic function was normal.    The estimated ejection fraction was in the range of 55% to 60%.    Wall motion was normal; there were no regional wall motion    abnormalities. Doppler parameters are consistent with abnormal    left ventricular relaxation (grade 1 diastolic dysfunction).  - Aortic valve: There was mild regurgitation.  - Mitral valve: There was mild regurgitation.    03/2020 nuclear stress No diagnostic ST segment changes to indicate ischemia. Small, mild intensity, apical to basal inferolateral defect that is partially reversible  mainly at the apex. This is consistent with scar and mild peri-infarct ischemia. This is a low risk study. Nuclear stress EF: 62%.   03/2020 echo IMPRESSIONS     1. Left ventricular ejection fraction, by  estimation, is 55 to 60%. The  left ventricle has normal function. The left ventricle has no regional  wall motion abnormalities. Left ventricular diastolic parameters are  indeterminate.   2. Right ventricular systolic function is normal. The right ventricular  size is normal. There is normal pulmonary artery systolic pressure. The  estimated right ventricular systolic pressure is 70.3 mmHg.   3. The mitral valve is grossly normal. Mild to moderate mitral valve  regurgitation.   4. The aortic valve is tricuspid. Aortic valve regurgitation is mild.  Aortic regurgitation PHT measures 528 msec.   5. Aortic dilatation noted. There is mild dilatation of the ascending  aorta, measuring 41 mm. There is mild dilatation of the aortic root,  measuring 40 mm.   6. The inferior vena cava is normal in size with greater than 50%  respiratory variability, suggesting right atrial pressure of 3 mmHg.      Assessment and Plan  CAD - no chest pains, chronic dyspnea with benign recent cardiac testing - continue current meds   2. Hyperlipidemia - at goal, continue current meds  3. Dizziness - possibly orthostatic dizziness, very poor oral hydration - orthostatics today no significant change in bp, borderline HR change up 15 bpm. Encouraged increased oral hydration and monitor dizziness  4.Aortic aneurysm - mild stable aneurysm, contineu to monitor   Arnoldo Lenis, M.D.

## 2022-08-16 ENCOUNTER — Other Ambulatory Visit (HOSPITAL_COMMUNITY): Payer: Self-pay | Admitting: Otolaryngology

## 2022-08-16 DIAGNOSIS — M25511 Pain in right shoulder: Secondary | ICD-10-CM

## 2022-09-07 ENCOUNTER — Other Ambulatory Visit: Payer: Self-pay

## 2022-09-07 NOTE — Telephone Encounter (Signed)
Prescription Request  09/07/2022  LOV: 05/27/22  What is the name of the medication or equipment? ACCU-CHEK AVIVA PLUS test strip [[id number]]  Have you contacted your pharmacy to request a refill? Yes   Which pharmacy would you like this sent to?  [id number] [place], [place] - [address] [place] [address] Phone: [phone] Fax: [phone]    Patient notified that their request is being sent to the clinical staff for review and that they should receive a response within 2 business days.   Please advise at [phone] [facility])

## 2022-09-08 MED ORDER — ACCU-CHEK AVIVA PLUS VI STRP: ORAL_STRIP | 1 refills | Status: DC

## 2022-09-08 NOTE — Telephone Encounter (Signed)
Requested Prescriptions  Pending Prescriptions Disp Refills   glucose blood (ACCU-CHEK AVIVA PLUS) test strip 200 each 1    Sig: USE TO CHECK BLOOD SUGAR TWICE DAILY E11.9     Endocrinology: Diabetes - Testing Supplies Failed - 09/07/2022  5:54 PM      Failed - Valid encounter within last 12 months    Recent Outpatient Visits           1 year ago New daily persistent headache   [facility] Pickard, Priscille Heidelberg, MD   1 year ago Rib pain on right side   [facility] Pickard, Priscille Heidelberg, MD   2 years ago Dyspnea on exertion   [facility] Donita Brooks, MD   2 years ago Dyspnea on exertion   [facility] Donita Brooks, MD   2 years ago Memory loss   [facility] Pickard, Priscille Heidelberg, MD       Future Appointments             In 3 months Branch, Dorothe Pea, MD [facility] at [place], [place]

## 2022-09-13 ENCOUNTER — Ambulatory Visit (HOSPITAL_COMMUNITY)
Admission: RE | Admit: 2022-09-13 | Discharge: 2022-09-13 | Disposition: A | Discharge status: Home / Self Care | Payer: Medicare HMO | Source: Ambulatory Visit | Attending: Otolaryngology | Admitting: Otolaryngology

## 2022-09-13 DIAGNOSIS — M25511 Pain in right shoulder: Secondary | ICD-10-CM | POA: Diagnosis present

## 2022-09-15 ENCOUNTER — Other Ambulatory Visit: Payer: Self-pay | Admitting: Family Medicine

## 2022-09-15 DIAGNOSIS — I25119 Atherosclerotic heart disease of native coronary artery with unspecified angina pectoris: Secondary | ICD-10-CM

## 2022-09-15 NOTE — Telephone Encounter (Signed)
Requested Prescriptions  Pending Prescriptions Disp Refills   clopidogrel (PLAVIX) 75 MG tablet [Pharmacy Med Name: CLOPIDOGREL 75 MG TABLET] 90 tablet 0    Sig: TAKE 1 TABLET BY MOUTH EVERY DAY     Hematology: Antiplatelets - clopidogrel Failed - 09/15/2022  1:53 AM      Failed - Valid encounter within last 6 months    Recent Outpatient Visits           1 year ago New daily persistent headache   [facility] Pickard, Priscille Heidelberg, MD   1 year ago Rib pain on right side   [facility] Donita Brooks, MD   2 years ago Dyspnea on exertion   [facility] Donita Brooks, MD   2 years ago Dyspnea on exertion   [facility] Donita Brooks, MD   2 years ago Memory loss   [facility] Pickard, Priscille Heidelberg, MD       Future Appointments             In 3 months Branch, Dorothe Pea, MD [facility] at [place], [place]            Passed - HCT in normal range and within 180 days    HCT  Date Value Ref Range Status  05/27/2022 44.1 38.5 - 50.0 % Final         Passed - HGB in normal range and within 180 days    Hemoglobin  Date Value Ref Range Status  05/27/2022 15.3 13.2 - 17.1 g/dL Final         Passed - PLT in normal range and within 180 days    Platelets  Date Value Ref Range Status  05/27/2022 204 140 - 400 Thousand/uL Final         Passed - Cr in normal range and within 360 days    Creat  Date Value Ref Range Status  05/20/2022 1.22 0.70 - 1.28 mg/dL Final

## 2022-09-18 ENCOUNTER — Emergency Department (HOSPITAL_COMMUNITY)
Admission: EM | Admit: 2022-09-18 | Discharge: 2022-09-19 | Disposition: A | Discharge status: Home / Self Care | Payer: No Typology Code available for payment source | Attending: Student | Admitting: Student

## 2022-09-18 ENCOUNTER — Other Ambulatory Visit: Payer: Self-pay

## 2022-09-18 ENCOUNTER — Emergency Department (HOSPITAL_COMMUNITY): Payer: No Typology Code available for payment source

## 2022-09-18 ENCOUNTER — Encounter (HOSPITAL_COMMUNITY): Payer: Self-pay

## 2022-09-18 DIAGNOSIS — E876 Hypokalemia: Secondary | ICD-10-CM | POA: Diagnosis not present

## 2022-09-18 DIAGNOSIS — I2510 Atherosclerotic heart disease of native coronary artery without angina pectoris: Secondary | ICD-10-CM | POA: Diagnosis not present

## 2022-09-18 DIAGNOSIS — Z79899 Other long term (current) drug therapy: Secondary | ICD-10-CM | POA: Diagnosis not present

## 2022-09-18 DIAGNOSIS — E119 Type 2 diabetes mellitus without complications: Secondary | ICD-10-CM | POA: Diagnosis not present

## 2022-09-18 DIAGNOSIS — R509 Fever, unspecified: Secondary | ICD-10-CM | POA: Diagnosis present

## 2022-09-18 DIAGNOSIS — Z955 Presence of coronary angioplasty implant and graft: Secondary | ICD-10-CM | POA: Insufficient documentation

## 2022-09-18 DIAGNOSIS — Z7982 Long term (current) use of aspirin: Secondary | ICD-10-CM | POA: Insufficient documentation

## 2022-09-18 DIAGNOSIS — Z7902 Long term (current) use of antithrombotics/antiplatelets: Secondary | ICD-10-CM | POA: Insufficient documentation

## 2022-09-18 DIAGNOSIS — Z1152 Encounter for screening for COVID-19: Secondary | ICD-10-CM | POA: Insufficient documentation

## 2022-09-18 HISTORY — DX: Cerebral infarction, unspecified: I63.9

## 2022-09-18 LAB — CBC WITH DIFFERENTIAL/PLATELET
Abs Immature Granulocytes: 0.03 10*3/uL (ref 0.00–0.07)
Basophils Absolute: 0.1 10*3/uL (ref 0.0–0.1)
Basophils Relative: 1 %
Eosinophils Absolute: 0 10*3/uL (ref 0.0–0.5)
Eosinophils Relative: 0 %
HCT: 42.4 % (ref 39.0–52.0)
Hemoglobin: 15.1 g/dL (ref 13.0–17.0)
Immature Granulocytes: 0 %
Lymphocytes Relative: 6 %
Lymphs Abs: 0.5 10*3/uL — ABNORMAL LOW (ref 0.7–4.0)
MCH: 32.1 pg (ref 26.0–34.0)
MCHC: 35.6 g/dL (ref 30.0–36.0)
MCV: 90.2 fL (ref 80.0–100.0)
Monocytes Absolute: 0.4 10*3/uL (ref 0.1–1.0)
Monocytes Relative: 4 %
Neutro Abs: 7.7 10*3/uL (ref 1.7–7.7)
Neutrophils Relative %: 89 %
Platelets: 172 10*3/uL (ref 150–400)
RBC: 4.7 MIL/uL (ref 4.22–5.81)
RDW: 13.4 % (ref 11.5–15.5)
WBC: 8.6 10*3/uL (ref 4.0–10.5)
nRBC: 0 % (ref 0.0–0.2)

## 2022-09-18 LAB — URINALYSIS, ROUTINE W REFLEX MICROSCOPIC
Bilirubin Urine: NEGATIVE
Glucose, UA: NEGATIVE mg/dL
Hgb urine dipstick: NEGATIVE
Ketones, ur: NEGATIVE mg/dL
Leukocytes,Ua: NEGATIVE
Nitrite: NEGATIVE
Protein, ur: NEGATIVE mg/dL
Specific Gravity, Urine: 1.01 (ref 1.005–1.030)
pH: 5 (ref 5.0–8.0)

## 2022-09-18 LAB — COMPREHENSIVE METABOLIC PANEL
ALT: 36 U/L (ref 0–44)
AST: 43 U/L — ABNORMAL HIGH (ref 15–41)
Albumin: 3.9 g/dL (ref 3.5–5.0)
Alkaline Phosphatase: 53 U/L (ref 38–126)
Anion gap: 11 (ref 5–15)
BUN: 21 mg/dL (ref 8–23)
CO2: 21 mmol/L — ABNORMAL LOW (ref 22–32)
Calcium: 8.8 mg/dL — ABNORMAL LOW (ref 8.9–10.3)
Chloride: 101 mmol/L (ref 98–111)
Creatinine, Ser: 1.31 mg/dL — ABNORMAL HIGH (ref 0.61–1.24)
GFR, Estimated: 56 mL/min — ABNORMAL LOW (ref >60)
Glucose, Bld: 173 mg/dL — ABNORMAL HIGH (ref 70–99)
Potassium: 3.4 mmol/L — ABNORMAL LOW (ref 3.5–5.1)
Sodium: 133 mmol/L — ABNORMAL LOW (ref 135–145)
Total Bilirubin: 0.9 mg/dL (ref 0.3–1.2)
Total Protein: 6.8 g/dL (ref 6.5–8.1)

## 2022-09-18 LAB — RESP PANEL BY RT-PCR (RSV, FLU A&B, COVID)  RVPGX2
Influenza A by PCR: NEGATIVE
Influenza B by PCR: NEGATIVE
Resp Syncytial Virus by PCR: NEGATIVE
SARS Coronavirus 2 by RT PCR: NEGATIVE

## 2022-09-18 LAB — LACTIC ACID, PLASMA: Lactic Acid, Venous: 1.4 mmol/L (ref 0.5–1.9)

## 2022-09-18 LAB — SARS CORONAVIRUS 2 BY RT PCR: SARS Coronavirus 2 by RT PCR: NEGATIVE

## 2022-09-18 LAB — PROTIME-INR
INR: 1.2 (ref 0.8–1.2)
Prothrombin Time: 15 seconds (ref 11.4–15.2)

## 2022-09-18 LAB — CULTURE, BLOOD (ROUTINE X 2)

## 2022-09-18 MED ORDER — ONDANSETRON HCL 4 MG/2ML IJ SOLN
4.0000 mg | Freq: Once | INTRAMUSCULAR | Status: DC
  Filled 2022-09-18: qty 2

## 2022-09-18 MED ORDER — LACTATED RINGERS IV BOLUS
1000.0000 mL | Freq: Once | INTRAVENOUS | Status: AC
  Administered 2022-09-18: 1000 mL via INTRAVENOUS

## 2022-09-18 NOTE — ED Provider Notes (Signed)
[place] EMERGENCY DEPARTMENT AT [facility] Provider Note   CSN: [id number] Arrival date & time: 09/18/22  2036     History  Chief Complaint  Patient presents with   Fever    Caleb Butler is a 77 y.o. male.  He has PMH of type 2 diabetes well-controlled and CAD with 1 stent.  He presents the ER today complaining of fever chills body ache and fatigue since last night.  Also having urinary frequency.  Stated that started suddenly last night and he was having chills and shaking for about 2 hours.  Temperature today was 102.7 several hours ago, improved with Tylenol at home.  States he still feeling poorly so he came to the ED for further evaluation.   Fever      Home Medications Prior to Admission medications   Medication Sig Start Date End Date Taking? Authorizing Provider  Accu-Chek FastClix Lancets MISC USE TO CHECK BLOOD SUGAR TWICE DAILY E11.9 02/24/20   Donita Brooks, MD  acetaminophen (TYLENOL) 500 MG tablet Take 500-1,000 mg by mouth daily as needed for moderate pain or headache.    [provider]  amoxicillin-clavulanate (AUGMENTIN) 875-125 MG tablet Take 1 tablet by mouth 2 (two) times daily. Patient not taking: Reported on 06/28/2022 05/20/22   Donita Brooks, MD  aspirin 81 MG EC tablet Take 81 mg by mouth at bedtime.     [provider]  blood glucose meter kit and supplies Dispense based on patient and insurance preference. Use up to four times daily as directed. (FOR ICD-10 E10.9, E11.9). 02/11/20   Donita Brooks, MD  Capsaicin 0.1 % CREA Apply topically as needed. Patient not taking: Reported on 06/28/2022 10/27/20   [provider]  clopidogrel (PLAVIX) 75 MG tablet TAKE 1 TABLET BY MOUTH EVERY DAY 09/15/22   Donita Brooks, MD  glucose blood (ACCU-CHEK AVIVA PLUS) test strip USE TO CHECK BLOOD SUGAR TWICE DAILY E11.9 09/08/22   Donita Brooks, MD  levothyroxine (SYNTHROID) 75 MCG tablet TAKE 1 TABLET BY  MOUTH EVERY DAY 04/07/22   Donita Brooks, MD  mometasone (ELOCON) 0.1 % ointment Apply topically daily. 02/11/19   Donita Brooks, MD  Multiple Vitamin (MULTIVITAMIN WITH MINERALS) TABS tablet Take 1 tablet by mouth daily.    [provider]  rosuvastatin (CRESTOR) 20 MG tablet TAKE 1 TABLET BY MOUTH EVERY DAY 05/13/22   Antoine Poche, MD  Semaglutide,0.25 or 0.5MG /DOS, (OZEMPIC, 0.25 OR 0.5 MG/DOSE,) 2 MG/1.5ML SOPN Inject 0.5 mg into the skin once a week. 05/14/20   Donita Brooks, MD  sildenafil (VIAGRA) 100 MG tablet Take 0.5-1 tablets (50-100 mg total) by mouth daily as needed for erectile dysfunction. Patient not taking: Reported on 06/28/2022 03/25/22   Donita Brooks, MD  solifenacin (VESICARE) 10 MG tablet Take 1 tablet (10 mg total) by mouth daily. Patient not taking: Reported on 06/28/2022 03/03/22   Donita Brooks, MD  vitamin B-12 (CYANOCOBALAMIN) 1000 MCG tablet Take 1,000 mcg by mouth daily.    [provider]      Allergies    Bee venom, Ceclor [cefaclor], Ciprofloxacin, Jardiance [empagliflozin], and Metformin and related    Review of Systems   Review of Systems  Constitutional:  Positive for fever.    Physical Exam Updated Vital Signs BP 94/67 (BP Location: Right Arm)   Pulse 99   Temp 98 F (36.7 C)   Resp (!) 24   Ht 5'  9.5" (1.765 m)   Wt 78 kg   SpO2 93%   BMI 25.04 kg/m  Physical Exam Vitals and nursing note reviewed.  Constitutional:      General: He is not in acute distress.    Appearance: He is well-developed.  HENT:     Head: Normocephalic and atraumatic.     Mouth/Throat:     Mouth: Mucous membranes are moist.  Eyes:     Conjunctiva/sclera: Conjunctivae normal.  Cardiovascular:     Rate and Rhythm: Normal rate and regular rhythm.     Heart sounds: No murmur heard. Pulmonary:     Effort: Pulmonary effort is normal. No respiratory distress.     Breath sounds: Normal breath sounds.  Abdominal:     General:  There is no distension.     Palpations: Abdomen is soft. There is no mass.     Tenderness: There is no abdominal tenderness. There is no right CVA tenderness, left CVA tenderness, guarding or rebound.  Musculoskeletal:        General: No swelling. Normal range of motion.     Cervical back: Neck supple.  Skin:    General: Skin is warm and dry.     Capillary Refill: Capillary refill takes less than 2 seconds.  Neurological:     General: No focal deficit present.     Mental Status: He is alert and oriented to person, place, and time.  Psychiatric:        Mood and Affect: Mood normal.     ED Results / Procedures / Treatments   Labs (all labs ordered are listed, but only abnormal results are displayed) Labs Reviewed  COMPREHENSIVE METABOLIC PANEL - Abnormal; Notable for the following components:      Result Value   Sodium 133 (*)    Potassium 3.4 (*)    CO2 21 (*)    Glucose, Bld 173 (*)    Creatinine, Ser 1.31 (*)    Calcium 8.8 (*)    AST 43 (*)    GFR, Estimated 56 (*)    All other components within normal limits  CBC WITH DIFFERENTIAL/PLATELET - Abnormal; Notable for the following components:   Lymphs Abs 0.5 (*)    All other components within normal limits  CULTURE, BLOOD (ROUTINE X 2)  CULTURE, BLOOD (ROUTINE X 2)  SARS CORONAVIRUS 2 BY RT PCR  LACTIC ACID, PLASMA  LACTIC ACID, PLASMA  PROTIME-INR  URINALYSIS, ROUTINE W REFLEX MICROSCOPIC    EKG None  Radiology DG Chest 2 View  Result Date: 09/18/2022 CLINICAL DATA:  Sepsis EXAM: CHEST - 2 VIEW COMPARISON:  None Available. FINDINGS: The heart size and mediastinal contours are within normal limits. Both lungs are clear. The visualized skeletal structures are unremarkable. IMPRESSION: No active cardiopulmonary disease. Electronically Signed   By: Deatra Robinson M.D.   On: 09/18/2022 22:00    Procedures Procedures    Medications Ordered in ED Medications  lactated ringers bolus 1,000 mL (has no administration in  time range)    ED Course/ Medical Decision Making/ A&P                             Medical Decision Making DDx: Pneumonia, UTI, COVID-19, influenza, bacteremia, sepsis, other ED course: 77 year old male history of CAD and diabetes presents the ER for about 24 hours of fever, chills, fatigue and 1 episode of vomiting.  Temperature of 1-2.7 at home resolved with Tylenol.  Blood pressure 96/62 but patient states that is his normal.  He has no leukocytosis, normal lactic acid, CMP shows mild hypokalemia at 3.4, CO2 slightly low at 21, creatinine is 1.31 which is around his baseline. Urinalysis and flu pending.  COVID is negative.  Patient is nontoxic in appearance.  Signed out to Dr.Bernette Mayersldon at this time.  Would likely plan to discharge for likely viral syndrome if tests are negative,   Amount and/or Complexity of Data Reviewed Labs: ordered. Radiology: ordered.           Final Clinical Impression(s) / ED Diagnoses Final diagnoses:  None    Rx / DC Orders ED Discharge Orders     None         Josem Kaufmann 09/18/22 2323    Glendora Score, MD 09/19/22 [phone]

## 2022-09-18 NOTE — ED Triage Notes (Addendum)
Pt presents with fever, fatigue, lightheadedness, N/V, cough, and frequent urination that started last night. Pt reports a temp of 102 at home. Pt last had APAP at 1930. Denies sick contacts.

## 2022-09-19 NOTE — ED Provider Notes (Signed)
Care of the patient assumed at the change of shift. Patient here for fever and chills, started last night. Has had some body aches and increased urinary frequency. He had borderline low BP on arrival but improved with IVF and back to his reported baseline. Not febrile here. He was awaiting Covid/Flu/RSV swab and UA at the change of shift both of which are normal. No clear source of his fever. He reports he is feeling much better and wants to go home. Recommend he continue with symptomatic care at home, increase PO fluid intake and RTED if symptoms return or for any other concerns. Cultures are pending.    Pollyann Savoy, MD 09/19/22 [id number]

## 2022-09-21 ENCOUNTER — Telehealth: Payer: Self-pay

## 2022-09-21 LAB — CULTURE, BLOOD (ROUTINE X 2)

## 2022-09-21 NOTE — Transitions of Care (Post Inpatient/ED Visit) (Signed)
   09/21/2022  Name: Kamin Niblack MRN: [id number] DOB: 1946-05-12  Today's TOC FU Call Status: Today's TOC FU Call Status:: Successful TOC FU Call Competed TOC FU Call Complete Date: 09/21/22  Transition Care Management Follow-up Telephone Call Date of Discharge: 09/19/22 [facility] Type of Discharge: Emergency Department Reason for ED Visit: Other: (Fever, unspecified fever cause) How have you been since you were released from the hospital?: Better Any questions or concerns?: No  Items Reviewed: Did you receive and understand the discharge instructions provided?: Yes Any new allergies since your discharge?: No Dietary orders reviewed?: NA Do you have support at home?: Yes  Home Care and Equipment/Supplies: Were Home Health Services Ordered?: NA Any new equipment or medical supplies ordered?: NA  Functional Questionnaire: Do you need assistance with bathing/showering or dressing?: No Do you need assistance with meal preparation?: No Do you need assistance with eating?: No Do you have difficulty maintaining continence: No Do you need assistance with getting out of bed/getting out of a chair/moving?: No Do you have difficulty managing or taking your medications?: No  Follow up appointments reviewed: PCP Follow-up appointment confirmed?: No (declined- wil call back if he starts feeling sick again) MD Provider Line Number:[phone] Given: Yes Specialist Hospital Follow-up appointment confirmed?: NA Do you need transportation to your follow-up appointment?: No Do you understand care options if your condition(s) worsen?: Yes-patient verbalized understanding    SIGNATURE  Agnes Lawrence, CMA (AAMA)  [facility] [phone]

## 2022-09-22 LAB — CULTURE, BLOOD (ROUTINE X 2)
Culture: NO GROWTH
Special Requests: ADEQUATE

## 2022-09-23 LAB — CULTURE, BLOOD (ROUTINE X 2): Special Requests: ADEQUATE

## 2022-12-12 ENCOUNTER — Other Ambulatory Visit: Payer: Self-pay | Admitting: Family Medicine

## 2022-12-12 DIAGNOSIS — I25119 Atherosclerotic heart disease of native coronary artery with unspecified angina pectoris: Secondary | ICD-10-CM

## 2022-12-12 NOTE — Telephone Encounter (Signed)
Requested medication (s) are due for refill today: Yes  Requested medication (s) are on the active medication list: Yes  Last refill:  09/15/22  Future visit scheduled: No  Notes to clinic:  Unable to refill per protocol, appointment needed.      Requested Prescriptions  Pending Prescriptions Disp Refills   clopidogrel (PLAVIX) 75 MG tablet [Pharmacy Med Name: CLOPIDOGREL 75 MG TABLET] 90 tablet 0    Sig: TAKE 1 TABLET BY MOUTH EVERY DAY     Hematology: Antiplatelets - clopidogrel Failed - 12/12/2022  2:40 AM      Failed - Cr in normal range and within 360 days    Creat  Date Value Ref Range Status  05/20/2022 1.22 0.70 - 1.28 mg/dL Final   Creatinine, Ser  Date Value Ref Range Status  09/18/2022 1.31 (H) 0.61 - 1.24 mg/dL Final         Failed - Valid encounter within last 6 months    Recent Outpatient Visits           1 year ago New daily persistent headache   [facility] Donita Brooks, MD   2 years ago Rib pain on right side   [facility] Pickard, Priscille Heidelberg, MD   2 years ago Dyspnea on exertion   [facility] Tanya Nones, Priscille Heidelberg, MD   2 years ago Dyspnea on exertion   [facility] Donita Brooks, MD   2 years ago Memory loss   [facility] Pickard, Priscille Heidelberg, MD       Future Appointments             In 4 days Branch, Dorothe Pea, MD [facility] at [place], [place]            Passed - HCT in normal range and within 180 days    HCT  Date Value Ref Range Status  09/18/2022 42.4 39.0 - 52.0 % Final         Passed - HGB in normal range and within 180 days    Hemoglobin  Date Value Ref Range Status  09/18/2022 15.1 13.0 - 17.0 g/dL Final         Passed - PLT in normal range and within 180 days    Platelets  Date Value Ref Range Status  09/18/2022 172 150 - 400 K/uL Final

## 2022-12-16 ENCOUNTER — Ambulatory Visit: Payer: Medicare HMO | Attending: Cardiology | Admitting: Cardiology

## 2022-12-16 ENCOUNTER — Encounter: Payer: Self-pay | Admitting: Cardiology

## 2022-12-16 ENCOUNTER — Encounter: Payer: Self-pay | Admitting: *Deleted

## 2022-12-16 VITALS — BP 106/58 | HR 72 | Ht 69.5 in | Wt 176.6 lb

## 2022-12-16 DIAGNOSIS — I7121 Aneurysm of the ascending aorta, without rupture: Secondary | ICD-10-CM | POA: Diagnosis not present

## 2022-12-16 DIAGNOSIS — I340 Nonrheumatic mitral (valve) insufficiency: Secondary | ICD-10-CM

## 2022-12-16 DIAGNOSIS — E782 Mixed hyperlipidemia: Secondary | ICD-10-CM | POA: Diagnosis not present

## 2022-12-16 DIAGNOSIS — I2510 Atherosclerotic heart disease of native coronary artery without angina pectoris: Secondary | ICD-10-CM

## 2022-12-16 NOTE — Patient Instructions (Signed)
Medication Instructions:  Continue all current medications.   Labwork: none  Testing/Procedures: none  Follow-Up: 6 months   Any Other Special Instructions Will Be Listed Below (If Applicable).   If you need a refill on your cardiac medications before your next appointment, please call your pharmacy.  

## 2022-12-16 NOTE — Progress Notes (Signed)
Clinical Summary Caleb Butler is a 77 y.o.male seen today for follow up of the following medical problems.    1.CAD - PCI to ostial LCX in 2018, given stent location indefinitite DAPT was recommended - relook caht 02/2018 with stable findings, does have residual 90% diag disease that recs were for medical management.  - 02/2018 echo LVEF 55-60%, no WMAs, grade I dd, mild MR, mild AI   - 03/2020 nuclear stress: apical scar with mild ischemia - 03/2020 echo LVEF 55-60%, no WMAs, indet diasotlic fxn, mild to mod MR, mild AI, ascending aorta 41mm   -very infrequent chest pains, about 1-2 times per month for just a few seconds. Can occur at rest or with exertion - compliant with meds      2. Chronic dyspnea - extensive cardiac and pulmonary workup in the past - symptoms overall unchanged.  - abnormal PFTs. 02/2022 mild restriction, normal diffusion.     01/2022 CT difficult to exclude interstitial disease - chronic SOB unchanged.  - followed by pulmonary Dr Kendrick Fries, ILD mild changes     3. Aortic dilatation - 04/2020 CTA: ascending aorta 3.9 cm - 01/2022 CT high res chest: 4.1 cm ascending aortic aneurysm   4. Hyperlipidemia - compliant with meds, reports recent labs with rheumatology 01/2022 TC 116 HDL 45 TG 187 LDL 45 - lab followed by pcp   5. Polymyalgia rheumatica - followed by rheumatology    Past Medical History:  Diagnosis Date   Anxiety    Arthritis    "my back is loaded w/it" (01/06/2017)   CAD (coronary artery disease)    80% ostial lesion in L circumflex (2017)   Chronic lower back pain    GERD (gastroesophageal reflux disease)    Hiatal hernia    High cholesterol    Hypothyroidism    IBS (irritable bowel syndrome)    Numbness and tingling of right lower extremity    "since 11/30/2016" (01/06/2017)   PMR (polymyalgia rheumatica) (HCC)    Stroke (HCC)    Type 2 diabetes mellitus (HCC)      Allergies  Allergen Reactions   Bee Venom Swelling    Ceclor [Cefaclor] Hives   Ciprofloxacin Hives   Jardiance [Empagliflozin]    Metformin And Related Diarrhea    upsesomach     Current Outpatient Medications  Medication Sig Dispense Refill   Accu-Chek FastClix Lancets MISC USE TO CHECK BLOOD SUGAR TWICE DAILY E11.9 102 each 12   acetaminophen (TYLENOL) 500 MG tablet Take 500-1,000 mg by mouth daily as needed for moderate pain or headache.     amoxicillin-clavulanate (AUGMENTIN) 875-125 MG tablet Take 1 tablet by mouth 2 (two) times daily. (Patient not taking: Reported on 06/28/2022) 20 tablet 0   aspirin 81 MG EC tablet Take 81 mg by mouth at bedtime.      blood glucose meter kit and supplies Dispense based on patient and insurance preference. Use up to four times daily as directed. (FOR ICD-10 E10.9, E11.9). 1 each 0   Capsaicin 0.1 % CREA Apply topically as needed. (Patient not taking: Reported on 06/28/2022)     clopidogrel (PLAVIX) 75 MG tablet TAKE 1 TABLET BY MOUTH EVERY DAY 30 tablet 0   glucose blood (ACCU-CHEK AVIVA PLUS) test strip USE TO CHECK BLOOD SUGAR TWICE DAILY E11.9 200 each 1   levothyroxine (SYNTHROID) 75 MCG tablet TAKE 1 TABLET BY MOUTH EVERY DAY 90 tablet 3   mometasone (ELOCON) 0.1 % ointment Apply topically  daily. 45 g 0   Multiple Vitamin (MULTIVITAMIN WITH MINERALS) TABS tablet Take 1 tablet by mouth daily.     rosuvastatin (CRESTOR) 20 MG tablet TAKE 1 TABLET BY MOUTH EVERY DAY 90 tablet 3   Semaglutide,0.25 or 0.5MG /DOS, (OZEMPIC, 0.25 OR 0.5 MG/DOSE,) 2 MG/1.5ML SOPN Inject 0.5 mg into the skin once a week. 1.5 mL 3   sildenafil (VIAGRA) 100 MG tablet Take 0.5-1 tablets (50-100 mg total) by mouth daily as needed for erectile dysfunction. (Patient not taking: Reported on 06/28/2022) 5 tablet 11   solifenacin (VESICARE) 10 MG tablet Take 1 tablet (10 mg total) by mouth daily. (Patient not taking: Reported on 06/28/2022) 90 tablet 1   vitamin B-12 (CYANOCOBALAMIN) 1000 MCG tablet Take 1,000 mcg by mouth daily.     No  current facility-administered medications for this visit.     Past Surgical History:  Procedure Laterality Date   CARDIAC CATHETERIZATION  06/2016   CORONARY ANGIOPLASTY WITH STENT PLACEMENT  01/06/2017   CORONARY STENT INTERVENTION N/A 01/06/2017   Procedure: CORONARY STENT INTERVENTION;  Surgeon: Tonny Bollman, MD;  Location: [facility] INVASIVE CV LAB;  Service: Cardiovascular;  Laterality: N/A;   FINGER SURGERY Left 1984   ring finger reattached.    FOREARM FRACTURE SURGERY Right 2004   "has 3 rods and 22 screws in it"   INGUINAL HERNIA REPAIR Left    RIGHT/LEFT HEART CATH AND CORONARY ANGIOGRAPHY N/A 07/07/2016   Procedure: Right/Left Heart Cath and Coronary Angiography;  Surgeon: Dolores Patty, MD;  Location: [facility] INVASIVE CV LAB;  Service: Cardiovascular;  Laterality: N/A;   RIGHT/LEFT HEART CATH AND CORONARY ANGIOGRAPHY N/A 03/13/2018   Procedure: RIGHT/LEFT HEART CATH AND CORONARY ANGIOGRAPHY;  Surgeon: Dolores Patty, MD;  Location: [facility] INVASIVE CV LAB;  Service: Cardiovascular;  Laterality: N/A;     Allergies  Allergen Reactions   Bee Venom Swelling   Ceclor [Cefaclor] Hives   Ciprofloxacin Hives   Jardiance [Empagliflozin]    Metformin And Related Diarrhea    upsesomach      Family History  Problem Relation Age of Onset   Diabetes Mellitus II Father    Heart failure Father    Kidney failure Father    Heart attack Mother      Social History Mr. Allbaugh reports that he has never smoked. He has never used smokeless tobacco. Mr. Bonet reports that he does not currently use alcohol after a past usage of about 2.0 standard drinks of alcohol per week.   Review of Systems CONSTITUTIONAL: No weight loss, fever, chills, weakness or fatigue.  HEENT: Eyes: No visual loss, blurred vision, double vision or yellow sclerae.No hearing loss, sneezing, congestion, runny nose or sore throat.  SKIN: No rash or itching.  CARDIOVASCULAR: per hpi RESPIRATORY: No shortness of  breath, cough or sputum.  GASTROINTESTINAL: No anorexia, nausea, vomiting or diarrhea. No abdominal pain or blood.  GENITOURINARY: No burning on urination, no polyuria NEUROLOGICAL: No headache, dizziness, syncope, paralysis, ataxia, numbness or tingling in the extremities. No change in bowel or bladder control.  MUSCULOSKELETAL: No muscle, back pain, joint pain or stiffness.  LYMPHATICS: No enlarged nodes. No history of splenectomy.  PSYCHIATRIC: No history of depression or anxiety.  ENDOCRINOLOGIC: No reports of sweating, cold or heat intolerance. No polyuria or polydipsia.  Marland Kitchen   Physical Examination Today's Vitals   12/16/22 1104  BP: (!) 106/58  Pulse: 72  SpO2: 96%  Weight: 176 lb 9.6 oz (80.1 kg)  Height: 5' 9.5" (1.765  m)   Body mass index is 25.71 kg/m.  Gen: resting comfortably, no acute distress HEENT: no scleral icterus, pupils equal round and reactive, no palptable cervical adenopathy,  CV: RRR, no mrg, no jvd Resp: Clear to auscultation bilaterally GI: abdomen is soft, non-tender, non-distended, normal bowel sounds, no hepatosplenomegaly MSK: extremities are warm, no edema.  Skin: warm, no rash Neuro:  no focal deficits Psych: appropriate affect   Diagnostic Studies 06/2016 cath The left ventricular ejection fraction is 55-65% by visual estimate. Prox RCA lesion, 40 %stenosed. Ost Cx lesion, 90 %stenosed. Ramus lesion, 90 %stenosed. LM lesion, 20 %stenosed. Mid LAD lesion, 20 %stenosed. 1st Diag lesion, 80 %stenosed.   Findings:   RA = 5 RV = 23/8 PA = 27/4 (16) PCW = 3 Fick cardiac output/index = 5.2/2.6 PVR = 2.5 WU  Ao sat =95% PA sat = 68%, 69%   Assessment: 1. 1-V CAD at ostium of LCX alsi involving ostium of ramus 2. Aneursymal proximal LAD with mild plaque 3. 40% Proximal RCA 4. Normal LVEF with normal hemodynamics   Plan/Discussion:   Case reviewed with Dr. Excell Seltzer. He has high grade ostial lesion in ostium of LCX also involving Ramus  Jeramyah Goodpasture. It is a moderate-sized system which is not completely favorable to PCI. Will proceed with ETT/Myvoview. If high ischemic burden will plan angioplasty +/- stenting. Otherwise proceed with aggressive medical therapy.    12/2016 PCI Successful PCI of severe ostial stenosis in the left circumflex, reducing the stenosis from 90% to 0% with Cutting Balloon angioplasty followed by drug-eluting stent implantation.   Recommendations: Aspirin and Plavix for a minimum of 12 months. If tolerated favor long-term DAPT as the stent is positioned at the left main bifurcation.     02/2018 RHC/LHC LM lesion is 20% stenosed. Ramus lesion is 90% stenosed. Prox RCA lesion is 40% stenosed. Ost 1st Diag to 1st Diag lesion is 90% stenosed. Prox LAD lesion is 30% stenosed. Previously placed Ost Cx to Prox Cx stent (unknown type) is widely patent. Ost LM to Mid LM lesion is 20% stenosed.     Findings:   RA = 7 RV = 32/9 PA = 33/10 (20) PCW = 10 Fick cardiac output/index = 5.4/2.7 PVR = 1.9 WU Ao sat = 93% PA sat = 64%, 68%   Assessment: 1. CAD with widely patent stent in ostial LCX 2. Unchanged 90% lesion in ostium of moderate-sized first diagonal 3. Otherwise mild non-obstructive CAD 4. EF 55-60% 6. Normal RHC numbers     02/2018 echo Study Conclusions   - Left ventricle: The cavity size was normal. Wall thickness was    increased in a pattern of mild LVH. Systolic function was normal.    The estimated ejection fraction was in the range of 55% to 60%.    Wall motion was normal; there were no regional wall motion    abnormalities. Doppler parameters are consistent with abnormal    left ventricular relaxation (grade 1 diastolic dysfunction).  - Aortic valve: There was mild regurgitation.  - Mitral valve: There was mild regurgitation.    03/2020 nuclear stress No diagnostic ST segment changes to indicate ischemia. Small, mild intensity, apical to basal inferolateral defect that is  partially reversible mainly at the apex. This is consistent with scar and mild peri-infarct ischemia. This is a low risk study. Nuclear stress EF: 62%.   03/2020 echo IMPRESSIONS     1. Left ventricular ejection fraction, by estimation, is 55 to 60%. The  left ventricle has normal function. The left ventricle has no regional  wall motion abnormalities. Left ventricular diastolic parameters are  indeterminate.   2. Right ventricular systolic function is normal. The right ventricular  size is normal. There is normal pulmonary artery systolic pressure. The  estimated right ventricular systolic pressure is 19.3 mmHg.   3. The mitral valve is grossly normal. Mild to moderate mitral valve  regurgitation.   4. The aortic valve is tricuspid. Aortic valve regurgitation is mild.  Aortic regurgitation PHT measures 528 msec.   5. Aortic dilatation noted. There is mild dilatation of the ascending  aorta, measuring 41 mm. There is mild dilatation of the aortic root,  measuring 40 mm.   6. The inferior vena cava is normal in size with greater than 50%  respiratory variability, suggesting right atrial pressure of 3 mmHg.         Assessment and Plan   CAD - no significant cardiac symptmos, conitnue current meds   2. Hyperlipidemia -continue current meds, request labs from pcp    3.Aortic aneurysm - mild stable aneurysm - he is getting annual CT's with pulmonary, we will monitor aneurysm on these results   4.Mitral regurgitation - mild by echo, would repeat later this year as will have been 3 years since last check  Antoine Poche, M.D.

## 2022-12-30 LAB — LAB REPORT - SCANNED
A1c: 7.4
Albumin, Urine POC: 15.7
Creatinine, POC: 239.3 mg/dL
EGFR: 63
Microalb Creat Ratio: 65.6

## 2023-01-04 ENCOUNTER — Other Ambulatory Visit: Payer: Self-pay | Admitting: Family Medicine

## 2023-01-04 DIAGNOSIS — I25119 Atherosclerotic heart disease of native coronary artery with unspecified angina pectoris: Secondary | ICD-10-CM

## 2023-01-05 NOTE — Telephone Encounter (Signed)
Requested medication (s) are due for refill today: yes  Requested medication (s) are on the active medication list: yes  Last refill:  12/12/22  Future visit scheduled: no  Notes to clinic:  Unable to refill per protocol, courtesy refill already given, routing for provider approval.      Requested Prescriptions  Pending Prescriptions Disp Refills   clopidogrel (PLAVIX) 75 MG tablet [Pharmacy Med Name: CLOPIDOGREL 75 MG TABLET] 90 tablet 1    Sig: TAKE 1 TABLET BY MOUTH EVERY DAY     Hematology: Antiplatelets - clopidogrel Failed - 01/04/2023  8:32 AM      Failed - Cr in normal range and within 360 days    Creat  Date Value Ref Range Status  05/20/2022 1.22 0.70 - 1.28 mg/dL Final   Creatinine, Ser  Date Value Ref Range Status  09/18/2022 1.31 (H) 0.61 - 1.24 mg/dL Final         Failed - Valid encounter within last 6 months    Recent Outpatient Visits           1 year ago New daily persistent headache   [facility] Donita Brooks, MD   2 years ago Rib pain on right side   [facility] Pickard, Priscille Heidelberg, MD   2 years ago Dyspnea on exertion   [facility] Tanya Nones, Priscille Heidelberg, MD   2 years ago Dyspnea on exertion   [facility] Donita Brooks, MD   2 years ago Memory loss   [facility] Pickard, Priscille Heidelberg, MD       Future Appointments             In 5 months Branch, Dorothe Pea, MD [facility] [place], [place]            Passed - HCT in normal range and within 180 days    HCT  Date Value Ref Range Status  09/18/2022 42.4 39.0 - 52.0 % Final         Passed - HGB in normal range and within 180 days    Hemoglobin  Date Value Ref Range Status  09/18/2022 15.1 13.0 - 17.0 g/dL Final         Passed - PLT in normal range and within 180 days    Platelets  Date Value Ref Range Status  09/18/2022 172 150 - 400 K/uL Final

## 2023-01-26 ENCOUNTER — Ambulatory Visit (INDEPENDENT_AMBULATORY_CARE_PROVIDER_SITE_OTHER): Payer: Medicare HMO | Admitting: Family Medicine

## 2023-01-26 VITALS — BP 110/58 | HR 66 | Temp 97.5°F | Ht 69.5 in | Wt 175.4 lb

## 2023-01-26 DIAGNOSIS — R0602 Shortness of breath: Secondary | ICD-10-CM | POA: Diagnosis not present

## 2023-01-26 DIAGNOSIS — I25119 Atherosclerotic heart disease of native coronary artery with unspecified angina pectoris: Secondary | ICD-10-CM | POA: Diagnosis not present

## 2023-01-26 DIAGNOSIS — E118 Type 2 diabetes mellitus with unspecified complications: Secondary | ICD-10-CM

## 2023-01-26 DIAGNOSIS — E039 Hypothyroidism, unspecified: Secondary | ICD-10-CM

## 2023-01-26 DIAGNOSIS — Z7985 Long-term (current) use of injectable non-insulin antidiabetic drugs: Secondary | ICD-10-CM

## 2023-01-26 MED ORDER — TRAZODONE HCL 50 MG PO TABS: 50.0000 mg | ORAL_TABLET | Freq: Every evening | ORAL | 0 refills | Status: DC | PRN

## 2023-01-26 NOTE — Progress Notes (Signed)
Subjective:    Patient ID: Caleb Butler, male    DOB: 07-Feb-1946, 77 y.o.   MRN: [id number] Patient here today for a follow-up of his chronic problems.  Recently went to the [place] where he had a battery of blood test performed.  His total cholesterol 1 5.  LDL was 60.  HDL was 38.  Hemoglobin A1c was 7.4.  Microalbumin to creatinine ratio was 65.  CBC was normal.  Creatinine was 1.2 with a GFR of 63.  AST and ALT were normal.  TSH was normal.  B12 was normal.  Iron levels were borderline low.  He is here today to review the lab work.  He also reports insomnia.  He states that he will often wake up in the melanite to urinate and then be unable to fall back asleep.  This is causing him to feel extremely tired during the day.  He also reports severe fatigue and shortness of breath with minimal activity.  He denies any chest pain.  He denies any angina.  He does report feeling short of breath with minimal activity Past Medical History:  Diagnosis Date   Anxiety    Arthritis    "my back is loaded w/it" (01/06/2017)   CAD (coronary artery disease)    80% ostial lesion in L circumflex (2017)   Chronic lower back pain    GERD (gastroesophageal reflux disease)    Hiatal hernia    High cholesterol    Hypothyroidism    IBS (irritable bowel syndrome)    Numbness and tingling of right lower extremity    "since 11/30/2016" (01/06/2017)   PMR (polymyalgia rheumatica) (HCC)    Stroke (HCC)    Type 2 diabetes mellitus (HCC)    Past Surgical History:  Procedure Laterality Date   CARDIAC CATHETERIZATION  06/2016   CORONARY ANGIOPLASTY WITH STENT PLACEMENT  01/06/2017   CORONARY STENT INTERVENTION N/A 01/06/2017   Procedure: CORONARY STENT INTERVENTION;  Surgeon: Tonny Bollman, MD;  Location[facility] INVASIVE CV LAB;  Service: Cardiovascular;  Laterality: N/A;   FINGER SURGERY Left 1984   ring finger reattached.    FOREARM FRACTURE SURGERY Right 2004   "has 3 rods and 22 screws in it"   INGUINAL HERNIA  REPAIR Left    RIGHT/LEFT HEART CATH AND CORONARY ANGIOGRAPHY N/A 07/07/2016   Procedure: Right/Left Heart Cath and Coronary Angiography;  Surgeon: Dolores Patty, MD;  Location: [facility] INVASIVE CV LAB;  Service: Cardiovascular;  Laterality: N/A;   RIGHT/LEFT HEART CATH AND CORONARY ANGIOGRAPHY N/A 03/13/2018   Procedure: RIGHT/LEFT HEART CATH AND CORONARY ANGIOGRAPHY;  Surgeon: Dolores Patty, MD;  Location: [facility] INVASIVE CV LAB;  Service: Cardiovascular;  Laterality: N/A;   Current Outpatient Medications on File Prior to Visit  Medication Sig Dispense Refill   Accu-Chek FastClix Lancets MISC USE TO CHECK BLOOD SUGAR TWICE DAILY E11.9 102 each 12   acetaminophen (TYLENOL) 500 MG tablet Take 500-1,000 mg by mouth daily as needed for moderate pain or headache.     aspirin 81 MG EC tablet Take 81 mg by mouth at bedtime.      blood glucose meter kit and supplies Dispense based on patient and insurance preference. Use up to four times daily as directed. (FOR ICD-10 E10.9, E11.9). 1 each 0   Capsaicin 0.1 % CREA Apply topically as needed.     clopidogrel (PLAVIX) 75 MG tablet TAKE 1 TABLET BY MOUTH EVERY DAY 90 tablet 0   glucose blood (ACCU-CHEK AVIVA PLUS)  test strip USE TO CHECK BLOOD SUGAR TWICE DAILY E11.9 200 each 1   levothyroxine (SYNTHROID) 75 MCG tablet TAKE 1 TABLET BY MOUTH EVERY DAY 90 tablet 3   mometasone (ELOCON) 0.1 % ointment Apply topically daily. 45 g 0   Multiple Vitamin (MULTIVITAMIN WITH MINERALS) TABS tablet Take 1 tablet by mouth daily.     rosuvastatin (CRESTOR) 20 MG tablet TAKE 1 TABLET BY MOUTH EVERY DAY 90 tablet 3   Semaglutide,0.25 or 0.5MG /DOS, (OZEMPIC, 0.25 OR 0.5 MG/DOSE,) 2 MG/1.5ML SOPN Inject 0.5 mg into the skin once a week. 1.5 mL 3   sildenafil (VIAGRA) 100 MG tablet Take 0.5-1 tablets (50-100 mg total) by mouth daily as needed for erectile dysfunction. 5 tablet 11   solifenacin (VESICARE) 10 MG tablet Take 1 tablet (10 mg total) by mouth daily. 90 tablet 1    vitamin B-12 (CYANOCOBALAMIN) 1000 MCG tablet Take 1,000 mcg by mouth daily.     No current facility-administered medications on file prior to visit.   Allergies  Allergen Reactions   Bee Venom Swelling   Ceclor [Cefaclor] Hives   Ciprofloxacin Hives   Jardiance [Empagliflozin]    Metformin And Related Diarrhea    upsesomach   Social History   Socioeconomic History   Marital status: Married    Spouse name: Gavin Pound   Number of children: 0   Years of education: 12   Highest education level: Not on file  Occupational History    Comment: retired  Tobacco Use   Smoking status: Never   Smokeless tobacco: Never  Vaping Use   Vaping status: Never Used  Substance and Sexual Activity   Alcohol use: Not Currently    Alcohol/week: 2.0 standard drinks of alcohol    Types: 1 Glasses of wine, 1 Cans of beer per week    Comment: 1 beer/week   Drug use: No   Sexual activity: Not Currently  Other Topics Concern   Not on file  Social History Narrative   Retired. Lives with wife, Gavin Pound.    Caffeine- coffee, 1 daily   Social Determinants of Health   Financial Resource Strain: Low Risk  (05/20/2021)   Overall Financial Resource Strain (CARDIA)    Difficulty of Paying Living Expenses: Not hard at all  Food Insecurity: No Food Insecurity (05/20/2021)   Hunger Vital Sign    Worried About Running Out of Food in the Last Year: Never true    Ran Out of Food in the Last Year: Never true  Transportation Needs: No Transportation Needs (05/20/2021)   PRAPARE - Administrator, Civil Service (Medical): No    Lack of Transportation (Non-Medical): No  Physical Activity: Insufficiently Active (05/20/2021)   Exercise Vital Sign    Days of Exercise per Week: 5 days    Minutes of Exercise per Session: 20 min  Stress: No Stress Concern Present (05/20/2021)   [employer] of Occupational Health - Occupational Stress Questionnaire    Feeling of Stress : Only a little  Social  Connections: Socially Integrated (05/20/2021)   Social Connection and Isolation Panel [NHANES]    Frequency of Communication with Friends and Family: More than three times a week    Frequency of Social Gatherings with Friends and Family: More than three times a week    Attends Religious Services: More than 4 times per year    Active Member of [employer] or Organizations: Yes    Attends Banker Meetings: More than 4 times per year  Marital Status: Married  Catering manager Violence: Not At Risk (05/20/2021)   Humiliation, Afraid, Rape, and Kick questionnaire    Fear of Current or Ex-Partner: No    Emotionally Abused: No    Physically Abused: No    Sexually Abused: No   Family History  Problem Relation Age of Onset   Diabetes Mellitus II Father    Heart failure Father    Kidney failure Father    Heart attack Mother       Review of Systems  All other systems reviewed and are negative.      Objective:   Physical Exam Vitals reviewed.  Constitutional:      General: He is not in acute distress.    Appearance: He is well-developed. He is not diaphoretic.  HENT:     Head: Normocephalic and atraumatic.     Right Ear: External ear normal.     Left Ear: External ear normal.     Nose: Nose normal.     Mouth/Throat:     Pharynx: No oropharyngeal exudate.  Eyes:     General: No visual field deficit or scleral icterus.       Right eye: No discharge.        Left eye: No discharge.     Conjunctiva/sclera: Conjunctivae normal.     Pupils: Pupils are equal, round, and reactive to light.  Neck:     Thyroid: No thyromegaly.     Vascular: No JVD.     Trachea: No tracheal deviation.  Cardiovascular:     Rate and Rhythm: Normal rate and regular rhythm.     Heart sounds: Normal heart sounds. No murmur heard.    No friction rub. No gallop.  Pulmonary:     Effort: Pulmonary effort is normal. No respiratory distress.     Breath sounds: Normal breath sounds. No stridor. No  wheezing or rales.  Chest:     Chest wall: No tenderness.  Abdominal:     General: Bowel sounds are normal. There is no distension.     Palpations: Abdomen is soft. There is no mass.     Tenderness: There is no abdominal tenderness. There is no guarding or rebound.  Musculoskeletal:        General: No tenderness.     Cervical back: Normal range of motion and neck supple.  Lymphadenopathy:     Cervical: No cervical adenopathy.  Skin:    General: Skin is warm.     Coloration: Skin is not pale.     Findings: No erythema or rash.  Neurological:     General: No focal deficit present.     Mental Status: He is alert and oriented to person, place, and time.     Cranial Nerves: No cranial nerve deficit, dysarthria or facial asymmetry.     Sensory: Sensation is intact. No sensory deficit.     Motor: Motor function is intact. No weakness, tremor, atrophy, abnormal muscle tone, seizure activity or pronator drift.     Coordination: Coordination is intact. Romberg sign negative. Coordination normal. Finger-Nose-Finger Test and Heel to [facility] Test normal.     Gait: Gait is intact. Gait and tandem walk normal.     Deep Tendon Reflexes: Reflexes are normal and symmetric.           Assessment & Plan:  Shortness of breath - Plan: ECHOCARDIOGRAM COMPLETE  Hypothyroidism, unspecified type  Coronary artery disease involving native coronary artery of native heart with angina pectoris (HCC)  Controlled type 2 diabetes mellitus with complication, without long-term current use of insulin (HCC) I have recommended increasing Ozempic to 1 mg subcu weekly to improve his hemoglobin A1c.  The remainder of his blood work is acceptable.  His blood pressure today is good.  I gave him trazodone 50 mg p.o. nightly as needed insomnia.  Also recommended getting an echocardiogram to evaluate for any evidence of reduced ejection fraction of congestive heart failure given shortness of breath with activity.  He has known  coronary disease. At the present time he denies any angina

## 2023-02-07 ENCOUNTER — Telehealth: Payer: Self-pay | Admitting: Family Medicine

## 2023-02-07 ENCOUNTER — Other Ambulatory Visit: Payer: Self-pay | Admitting: Family Medicine

## 2023-02-07 MED ORDER — NIRMATRELVIR/RITONAVIR (PAXLOVID)TABLET: 3.0000 | ORAL_TABLET | Freq: Two times a day (BID) | ORAL | 0 refills | Status: AC

## 2023-02-07 NOTE — Telephone Encounter (Signed)
Patient called and advised per Dr Tanya Nones I sent in paxlovid to [facility]. Patient verbalized understanding.

## 2023-02-07 NOTE — Telephone Encounter (Signed)
Patient's wife Deb left two voicemail messages during our lunch hour to advise provider of patient's respiratory sx as follows:  Pulse Oximetry 95% B/p 98/60 at [facility] Temp 100.6 this morning Non-productive cough Feels miserable Some body aches Chills Fatigue  Sx began last evening.  Deb stated patient took an old COVID test at home which appeared to show a positive result.  Patient went to [facility]; waited 3 hours. Took another COVID test there; waiting for results. [facility] had no provider on site; patient left.  Requesting for provider to call in medication for management of sx.  Pharmacy confirmed as:  [id number] [place], [facility], [place] - [address] AT [facility] [address] [place], [place] [address] Phone: [phone]  Fax: [phone] DEA #: [id number]   Please advise at [phone].

## 2023-02-14 ENCOUNTER — Encounter: Payer: Self-pay | Admitting: Cardiology

## 2023-02-16 ENCOUNTER — Ambulatory Visit (HOSPITAL_COMMUNITY): Payer: No Typology Code available for payment source | Attending: Family Medicine

## 2023-02-16 DIAGNOSIS — R0602 Shortness of breath: Secondary | ICD-10-CM | POA: Diagnosis present

## 2023-02-16 LAB — ECHOCARDIOGRAM COMPLETE
Area-P 1/2: 3.27 cm2
P 1/2 time: 527 msec
S' Lateral: 3 cm

## 2023-02-17 ENCOUNTER — Encounter: Payer: Self-pay | Admitting: Family Medicine

## 2023-02-17 DIAGNOSIS — I719 Aortic aneurysm of unspecified site, without rupture: Secondary | ICD-10-CM | POA: Insufficient documentation

## 2023-02-19 ENCOUNTER — Other Ambulatory Visit: Payer: Self-pay | Admitting: Family Medicine

## 2023-02-21 NOTE — Telephone Encounter (Signed)
Requested Prescriptions  Pending Prescriptions Disp Refills   traZODone (DESYREL) 50 MG tablet [Pharmacy Med Name: TRAZODONE 50 MG TABLET] 90 tablet 0    Sig: TAKE 1 TABLET BY MOUTH AT BEDTIME AS NEEDED FOR SLEEP.     Psychiatry: Antidepressants - Serotonin Modulator Failed - 02/19/2023  8:31 AM      Failed - Valid encounter within last 6 months    Recent Outpatient Visits           1 year ago New daily persistent headache   [facility] Tanya Nones, Priscille Heidelberg, MD   2 years ago Rib pain on right side   [facility] Pickard, Priscille Heidelberg, MD   2 years ago Dyspnea on exertion   [facility] Donita Brooks, MD   2 years ago Dyspnea on exertion   [facility] Donita Brooks, MD   3 years ago Memory loss   [facility] Pickard, Priscille Heidelberg, MD       Future Appointments             In 4 months Branch, Dorothe Pea, MD [facility] at [place], [place]

## 2023-02-27 ENCOUNTER — Other Ambulatory Visit: Payer: Self-pay | Admitting: Family Medicine

## 2023-02-28 NOTE — Telephone Encounter (Signed)
Requested by interface surescripts. Last OV 01/26/23 . No future visit at this time.  Requested Prescriptions  Pending Prescriptions Disp Refills   ACCU-CHEK AVIVA PLUS test strip [Pharmacy Med Name: ACCU-CHEK AVIVA PLUS TEST STRP] 200 strip 1    Sig: USE TO CHECK BLOOD SUGAR TWICE DAILY E11.9     Endocrinology: Diabetes - Testing Supplies Failed - 02/27/2023 12:47 PM      Failed - Valid encounter within last 12 months    Recent Outpatient Visits           1 year ago New daily persistent headache   [facility] Donita Brooks, MD   2 years ago Rib pain on right side   [facility] Pickard, Priscille Heidelberg, MD   2 years ago Dyspnea on exertion   [facility] Donita Brooks, MD   2 years ago Dyspnea on exertion   [facility] Donita Brooks, MD   3 years ago Memory loss   [facility] Pickard, Priscille Heidelberg, MD       Future Appointments             In 3 months Branch, Dorothe Pea, MD [facility] at [place], [place]

## 2023-03-20 ENCOUNTER — Other Ambulatory Visit: Payer: Self-pay | Admitting: *Deleted

## 2023-03-20 DIAGNOSIS — J849 Interstitial pulmonary disease, unspecified: Secondary | ICD-10-CM

## 2023-03-21 ENCOUNTER — Ambulatory Visit: Payer: No Typology Code available for payment source | Admitting: Adult Health

## 2023-03-21 ENCOUNTER — Encounter: Payer: Self-pay | Admitting: Adult Health

## 2023-03-21 ENCOUNTER — Other Ambulatory Visit (INDEPENDENT_AMBULATORY_CARE_PROVIDER_SITE_OTHER): Payer: No Typology Code available for payment source

## 2023-03-21 ENCOUNTER — Ambulatory Visit: Payer: No Typology Code available for payment source | Admitting: Pulmonary Disease

## 2023-03-21 VITALS — BP 106/62 | HR 71 | Temp 97.8°F | Ht 69.5 in | Wt 175.0 lb

## 2023-03-21 DIAGNOSIS — J849 Interstitial pulmonary disease, unspecified: Secondary | ICD-10-CM

## 2023-03-21 LAB — CBC WITH DIFFERENTIAL/PLATELET
Basophils Absolute: 0.1 10*3/uL (ref 0.0–0.1)
Basophils Relative: 0.8 % (ref 0.0–3.0)
Eosinophils Absolute: 0.2 10*3/uL (ref 0.0–0.7)
Eosinophils Relative: 2.2 % (ref 0.0–5.0)
HCT: 44.5 % (ref 39.0–52.0)
Hemoglobin: 14.7 g/dL (ref 13.0–17.0)
Lymphocytes Relative: 17 % (ref 12.0–46.0)
Lymphs Abs: 1.6 10*3/uL (ref 0.7–4.0)
MCHC: 33 g/dL (ref 30.0–36.0)
MCV: 96 fL (ref 78.0–100.0)
Monocytes Absolute: 0.7 10*3/uL (ref 0.1–1.0)
Monocytes Relative: 7.7 % (ref 3.0–12.0)
Neutro Abs: 6.6 10*3/uL (ref 1.4–7.7)
Neutrophils Relative %: 72.3 % (ref 43.0–77.0)
Platelets: 232 10*3/uL (ref 150.0–400.0)
RBC: 4.64 Mil/uL (ref 4.22–5.81)
RDW: 14.1 % (ref 11.5–15.5)
WBC: 9.2 10*3/uL (ref 4.0–10.5)

## 2023-03-21 LAB — HEPATIC FUNCTION PANEL
ALT: 19 U/L (ref 0–53)
AST: 22 U/L (ref 0–37)
Albumin: 4.9 g/dL (ref 3.5–5.2)
Alkaline Phosphatase: 58 U/L (ref 39–117)
Bilirubin, Direct: 0.1 mg/dL (ref 0.0–0.3)
Total Bilirubin: 0.5 mg/dL (ref 0.2–1.2)
Total Protein: 7.7 g/dL (ref 6.0–8.3)

## 2023-03-21 MED ORDER — STIOLTO RESPIMAT 2.5-2.5 MCG/ACT IN AERS: 2.0000 | INHALATION_SPRAY | Freq: Every day | RESPIRATORY_TRACT | Status: DC

## 2023-03-21 MED ORDER — STIOLTO RESPIMAT 2.5-2.5 MCG/ACT IN AERS: 2.0000 | INHALATION_SPRAY | Freq: Every day | RESPIRATORY_TRACT | 5 refills | Status: DC

## 2023-03-21 NOTE — Progress Notes (Unsigned)
@Patient  ID: Caleb Butler, male    DOB: Jun 26, 1945, 77 y.o.   MRN: [id number]  Chief Complaint  Patient presents with   Follow-up    Referring provider: Donita Brooks, MD  HPI: 77 year old male never smoker followed for mild interstitial lung disease Medical history significant for polymyalgia rheumatica-treated with steroids and methotrexate (stopped 2022)   TEST/EVENTS :  2017 high-resolution CT scan of the chest images independently reviewed showing very mild nonspecific interstitial changes in the right lower lobe, otherwise normal pulmonary parenchyma 2021 CT angiogram chest images independently reviewed: Mild interlobular septal thickening in the bases, no pleural effusion, otherwise normal pulmonary parenchyma October 2023 high-resolution CT scan of the chest showed scattered subpleural reticular densities similar to the 2021 study.  Could be postinfectious or inflammatory.  Indeterminate for UIP.  Air-trapping noted.  4.1 cm ascending aortic aneurysm.  Old rib fractures.   PFT: 12/07/2015 shows FVC 2.75 L/64%, FEV1 2.05 L/65% and a ratio of 75 near normal/100%. No postbronchodilator response in FVC but there was 150 ML postbronchodilator response in FEV1 to 2.20 L/69% with a ratio of 81. Total lung capacity 5.6/77 consistent with restriction. DLCO 20.7/66% with reduced diffusion capacity. 2017 cardiopulmonary exercise stress test ratio 73%, FVC 3.19 L, during exercise his peak ventilation reached 103% of his maximum ventilatory volume indicating ventilatory reserve limits were exceeded.  He had normal functional capacity. October 2023 ratio 78%, FVC 2.67 L 65% predicted, total lung capacity 5.21 L 76% predicted, DLCO 19.88 mL/min/mmHg 81% predicted   Labs: 2021 hemoglobin 14.9   Path:   Echo: 2021 echocardiogram LVEF 55 to 60%, RV size and function normal, PA systolic pressure estimate 19.3, mild to moderate mitral regurgitation, mild aortic valve regurgitation,  mild aortic dilation at 41 mm at the root   Heart Catheterization: 2019 LHC/RHC> RA = 7 RV = 32/9 PA = 33/10 (20) PCW = 10 Fick cardiac output/index = 5.4/2.7 PVR = 1.9 WU;   Assessment: 1. CAD with widely patent stent in ostial LCX 2. Unchanged 90% lesion in ostium of moderate-sized first diagonal 3. Otherwise mild non-obstructive CAD 4. EF 55-60% 6. Normal RHC numbers  Echo 01/2023 EF 55-60%, normal PAP   03/21/2023 Follow up : ILD  Patient returns for 1 year follow-up.  Patient has been followed for mild interstitial lung disease changes on CT scan.  High-resolution CT chest October 2023 showed scattered subpleural reticular densities similar to 2021.  Indeterminate for UIP.  Patient does have a history of polymyalgia rheumatica with previous methotrexate treatment.  PFTs in 2023 showed moderate restriction with FVC at 65%, ratio 78, diffusing capacity at 81%.  Clinically patient has been stable with minimal symptoms.  Patient was set up for repeat serial PFTs today show a slight decline in FVC.  But stable diffusing capacity.  FEV1 is at 61%, ratio 80, FVC 56%.,  Diffusing capacity is improved to 84%. Since last visit patient says he is doing well.  Has no flare of cough or wheezing.  No increased shortness of breath.  No decrease in activity tolerance. Chest x-ray April 2024 showed clear lungs. 2D echo February 16, 2023 showed normal pulmonary artery systolic pressure. Patient remains active.  Is not on any oxygen.  He is a never smoker. Tried albuterol inhaler without perceived benefit. No previous Asthma or COPD diagnosis   Patient says he does get short of breath with activities.  Has noticed a decline in his activity tolerance.  He works on a farm.  Has intermittent dry cough.   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 Agent Orange exposure.  Has basement-no known mold. No hot tub.  Has bird-small parrot . Chicken outside -coop- goes into coop  twice daily .  No feather pillows.  No chemo or radiation . No history of cancer  No amiodarone use.  From [place].    Allergies  Allergen Reactions   Bee Venom Swelling   Ceclor [Cefaclor] Hives   Ciprofloxacin Hives   Jardiance [Empagliflozin]    Metformin And Related Diarrhea    upsesomach    Immunization History  Administered Date(s) Administered   Fluad Quad(high Dose 65+) 02/20/2020, 03/17/2021   Influenza, High Dose Seasonal PF 01/29/2019   Influenza, Quadrivalent, Recombinant, Inj, Pf 03/13/2017, 01/21/2019   Influenza,inj,Quad PF,6+ Mos 02/25/2016   Influenza-Unspecified 02/16/2018, 02/28/2019, 02/12/2020, 02/27/2021   Moderna Covid-19 Fall Seasonal Vaccine 73yrs & older 03/10/2023   Moderna Covid-19 Vaccine Bivalent Booster 47yrs & up 04/05/2021   Moderna Sars-Covid-2 Vaccination 07/05/2019, 07/30/2019, 04/16/2020, 10/27/2020   Pfizer(Comirnaty)Fall Seasonal Vaccine 12 years and older 06/08/2022   Pneumococcal Conjugate-13 04/19/2019   Pneumococcal Polysaccharide-23 08/01/2019   Tdap 11/30/2015   Zoster Recombinant(Shingrix) 10/05/2021, 01/14/2022    Past Medical History:  Diagnosis Date   Anxiety    Aortic aneurysm (HCC)    dilated aortic root on echo 2024   Arthritis    "my back is loaded w/it" (01/06/2017)   CAD (coronary artery disease)    80% ostial lesion in L circumflex (2017)   Chronic lower back pain    GERD (gastroesophageal reflux disease)    Hiatal hernia    High cholesterol    Hypothyroidism    IBS (irritable bowel syndrome)    Numbness and tingling of right lower extremity    "since 11/30/2016" (01/06/2017)   PMR (polymyalgia rheumatica) (HCC)    Stroke (HCC)    Type 2 diabetes mellitus (HCC)     Tobacco History: Social History   Tobacco Use  Smoking Status Never  Smokeless Tobacco Never   Counseling given: Not Answered   Outpatient Medications Prior to Visit  Medication Sig Dispense Refill   ACCU-CHEK AVIVA PLUS test strip  USE TO CHECK BLOOD SUGAR TWICE DAILY E11.9 200 strip 1   Accu-Chek FastClix Lancets MISC USE TO CHECK BLOOD SUGAR TWICE DAILY E11.9 102 each 12   acetaminophen (TYLENOL) 500 MG tablet Take 500-1,000 mg by mouth daily as needed for moderate pain or headache.     aspirin 81 MG EC tablet Take 81 mg by mouth at bedtime.      blood glucose meter kit and supplies Dispense based on patient and insurance preference. Use up to four times daily as directed. (FOR ICD-10 E10.9, E11.9). 1 each 0   Capsaicin 0.1 % CREA Apply topically as needed.     clopidogrel (PLAVIX) 75 MG tablet TAKE 1 TABLET BY MOUTH EVERY DAY 90 tablet 0   levothyroxine (SYNTHROID) 75 MCG tablet TAKE 1 TABLET BY MOUTH EVERY DAY 90 tablet 3   mometasone (ELOCON) 0.1 % ointment Apply topically daily. 45 g 0   Multiple Vitamin (MULTIVITAMIN WITH MINERALS) TABS tablet Take 1 tablet by mouth daily.     rosuvastatin (CRESTOR) 20 MG tablet TAKE 1 TABLET BY MOUTH EVERY DAY 90 tablet 3   Semaglutide,0.25 or 0.5MG /DOS, (OZEMPIC, 0.25 OR 0.5 MG/DOSE,) 2 MG/1.5ML SOPN Inject 0.5 mg into the skin once a week. 1.5 mL 3   sildenafil (VIAGRA) 100 MG tablet Take  0.5-1 tablets (50-100 mg total) by mouth daily as needed for erectile dysfunction. 5 tablet 11   vitamin B-12 (CYANOCOBALAMIN) 1000 MCG tablet Take 1,000 mcg by mouth daily.     solifenacin (VESICARE) 10 MG tablet Take 1 tablet (10 mg total) by mouth daily. (Patient not taking: Reported on 03/21/2023) 90 tablet 1   traZODone (DESYREL) 50 MG tablet TAKE 1 TABLET BY MOUTH AT BEDTIME AS NEEDED FOR SLEEP. (Patient not taking: Reported on 03/21/2023) 90 tablet 0   No facility-administered medications prior to visit.     Review of Systems:   Constitutional:   No  weight loss, night sweats,  Fevers, chills, +fatigue, or  lassitude.  HEENT:   No headaches,  Difficulty swallowing,  Tooth/dental problems, or  Sore throat,                No sneezing, itching, ear ache, nasal congestion, post nasal  drip,   CV:  No chest pain,  Orthopnea, PND, swelling in lower extremities, anasarca, dizziness, palpitations, syncope.   GI  No heartburn, indigestion, abdominal pain, nausea, vomiting, diarrhea, change in bowel habits, loss of appetite, bloody stools.   Resp: .  No chest wall deformity  Skin: no rash or lesions.  GU: no dysuria, change in color of urine, no urgency or frequency.  No flank pain, no hematuria   MS:  No joint pain or swelling.  No decreased range of motion.  No back pain.    Physical Exam   GEN: A/Ox3; pleasant , NAD, well nourished    HEENT:  [place]/AT,  EACs-clear, TMs-wnl, NOSE-clear, THROAT-clear, no lesions, no postnasal drip or exudate noted.   NECK:  Supple w/ fair ROM; no JVD; normal carotid impulses w/o bruits; no thyromegaly or nodules palpated; no lymphadenopathy.    RESP  Clear  P & A; w/o, wheezes/ rales/ or rhonchi. no accessory muscle use, no dullness to percussion  CARD:  RRR, no m/r/g, no peripheral edema, pulses intact, no cyanosis or clubbing.  GI:   Soft & nt; nml bowel sounds; no organomegaly or masses detected.   Musco: Warm bil, no deformities or joint swelling noted.   Neuro: alert, no focal deficits noted.    Skin: Warm, no lesions or rashes    Lab Results:    BNP    Component Value Date/Time   BNP 48 11/03/2017 1050    ProBNP No results found for: "PROBNP"  Imaging: No results found.  Administration History     None          Latest Ref Rng & Units 03/15/2022    8:29 AM 12/07/2015    2:34 PM  PFT Results  FVC-Pre L 2.45  2.75   FVC-Predicted Pre % 60  64   FVC-Post L 2.67  2.73   FVC-Predicted Post % 65  63   Pre FEV1/FVC % % 78  75   Post FEV1/FCV % % 83  81   FEV1-Pre L 1.92  2.05   FEV1-Predicted Pre % 65  65   FEV1-Post L 2.20  2.20   DLCO uncorrected ml/min/mmHg 19.88  20.70   DLCO UNC% % 81  66   DLCO corrected ml/min/mmHg 19.88    DLCO COR %Predicted % 81    DLVA Predicted % 114  100   TLC L  5.21  5.26   TLC % Predicted % 76  77   RV % Predicted % 98  112     Lab Results  Component Value Date   NITRICOXIDE 21 03/09/2016        Assessment & Plan:   No problem-specific Assessment & Plan notes found for this encounter.   I spent  42  minutes dedicated to the care of this patient on the date of this encounter to include pre-visit review of records, face-to-face time with the patient discussing conditions above, post visit ordering of testing, clinical documentation with the electronic health record, making appropriate referrals as documented, and communicating necessary findings to members of the patients care team.    Rubye Oaks, NP 03/21/2023

## 2023-03-21 NOTE — Patient Instructions (Signed)
Full PFT performed today. °

## 2023-03-21 NOTE — Progress Notes (Signed)
Full PFT performed today. °

## 2023-03-21 NOTE — Progress Notes (Unsigned)
Patient seen in the office today and instructed on use of Stiolto.  Patient expressed understanding and demonstrated technique. 

## 2023-03-21 NOTE — Patient Instructions (Addendum)
Trial Stiolto 2 puffs daily  Albuterol inhaler As needed   Set up HRCT Chest  Labs today  Refer to Pulmonary rehab.  Follow up with Dr. Isaiah Serge in 3 months -ILD clinic slot -new patient

## 2023-03-23 DIAGNOSIS — J849 Interstitial pulmonary disease, unspecified: Secondary | ICD-10-CM | POA: Insufficient documentation

## 2023-03-23 NOTE — Assessment & Plan Note (Addendum)
ILD changes on high-resolution CT chest indeterminate for UIP.  PFTs show slight decline in lung capacity.  Diffusing capacity is stayed stable.  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  Will check autoimmune panel and hypersensitivity pneumonitis panel. Repeat high-resolution CT chest Referred to pulmonary rehab. Trial of Stiolto to see if it helps with his shortness of breath.  Plan  Patient Instructions  Trial Stiolto 2 puffs daily  Albuterol inhaler As needed   Set up HRCT Chest  Labs today  Refer to Pulmonary rehab.  Follow up with Dr. Isaiah Serge in 3 months -ILD clinic slot -new patient

## 2023-03-27 LAB — HYPERSENSITIVITY PNEUMONITIS
A. Pullulans Abs: NEGATIVE
A.Fumigatus #1 Abs: NEGATIVE
Micropolyspora faeni, IgG: NEGATIVE
Pigeon Serum Abs: NEGATIVE
Thermoact. Saccharii: NEGATIVE
Thermoactinomyces vulgaris, IgG: NEGATIVE

## 2023-03-27 LAB — CYCLIC CITRUL PEPTIDE ANTIBODY, IGG: Cyclic Citrullin Peptide Ab: <16 U

## 2023-03-27 LAB — IGE: IgE (Immunoglobulin E), Serum: 1806 kU/L — ABNORMAL HIGH (ref <=114)

## 2023-03-27 LAB — ANA: Anti Nuclear Antibody (ANA): NEGATIVE

## 2023-03-27 LAB — ANTI-DNA ANTIBODY, DOUBLE-STRANDED: ds DNA Ab: <1 [IU]/mL

## 2023-03-27 LAB — RHEUMATOID FACTOR: Rheumatoid fact SerPl-aCnc: <10 [IU]/mL (ref <14)

## 2023-03-29 ENCOUNTER — Other Ambulatory Visit: Payer: Self-pay | Admitting: Family Medicine

## 2023-03-29 NOTE — Telephone Encounter (Signed)
Requested medication (s) are due for refill today:   Yes  Requested medication (s) are on the active medication list:   Yes  Future visit scheduled:   No    Needs to be scheduled for his yearly physical   LOV 01/26/2023 and before that 05/27/2022.   Last ordered: 04/07/2022 #90, 3 refills.      Unable to refill due to TSH being due along with his physical exam.  Requested Prescriptions  Pending Prescriptions Disp Refills   levothyroxine (SYNTHROID) 75 MCG tablet [Pharmacy Med Name: LEVOTHYROXINE 75 MCG TABLET] 90 tablet 3    Sig: TAKE 1 TABLET BY MOUTH EVERY DAY     Endocrinology:  Hypothyroid Agents Failed - 03/29/2023  2:32 AM      Failed - TSH in normal range and within 360 days    TSH  Date Value Ref Range Status  02/24/2022 1.22 0.40 - 4.50 mIU/L Final         Failed - Valid encounter within last 12 months    Recent Outpatient Visits           1 year ago New daily persistent headache   [facility] Donita Brooks, MD   2 years ago Rib pain on right side   [facility] Pickard, Priscille Heidelberg, MD   2 years ago Dyspnea on exertion   [facility] Donita Brooks, MD   3 years ago Dyspnea on exertion   [facility] Donita Brooks, MD   3 years ago Memory loss   [facility] Pickard, Priscille Heidelberg, MD       Future Appointments             In 2 months Branch, Dorothe Pea, MD [facility] at [place], [place]

## 2023-04-03 ENCOUNTER — Telehealth: Payer: Self-pay | Admitting: Adult Health

## 2023-04-03 NOTE — Telephone Encounter (Signed)
Patient is returning missed call in regards to blood work.

## 2023-04-03 NOTE — Progress Notes (Signed)
ATC patient x1.  LVM to return call. 

## 2023-04-04 ENCOUNTER — Ambulatory Visit (INDEPENDENT_AMBULATORY_CARE_PROVIDER_SITE_OTHER): Payer: Medicare HMO | Admitting: Family Medicine

## 2023-04-04 VITALS — BP 118/64 | HR 69 | Temp 97.8°F | Ht 69.5 in | Wt 178.4 lb

## 2023-04-04 DIAGNOSIS — E039 Hypothyroidism, unspecified: Secondary | ICD-10-CM

## 2023-04-04 DIAGNOSIS — E118 Type 2 diabetes mellitus with unspecified complications: Secondary | ICD-10-CM

## 2023-04-04 DIAGNOSIS — Z7985 Long-term (current) use of injectable non-insulin antidiabetic drugs: Secondary | ICD-10-CM

## 2023-04-04 MED ORDER — BLOOD GLUCOSE MONITORING SUPPL DEVI
1.0000 | Freq: Three times a day (TID) | 0 refills | Status: AC
Start: 2023-04-04 — End: ?

## 2023-04-04 MED ORDER — BLOOD GLUCOSE TEST VI STRP: 1.0000 | ORAL_STRIP | Freq: Three times a day (TID) | 0 refills | Status: AC

## 2023-04-04 MED ORDER — LEVOTHYROXINE SODIUM 75 MCG PO TABS: 75.0000 ug | ORAL_TABLET | Freq: Every day | ORAL | 3 refills | Status: DC

## 2023-04-04 MED ORDER — LANCET DEVICE MISC: 1.0000 | Freq: Three times a day (TID) | 0 refills | Status: AC

## 2023-04-04 MED ORDER — LANCETS MISC. MISC: 1.0000 | Freq: Three times a day (TID) | 0 refills | Status: AC

## 2023-04-04 NOTE — Progress Notes (Signed)
Subjective:    Patient ID: Caleb Butler, male    DOB: [birth year], 77 y.o.   MRN: [id number]  Patient is here today to follow-up on his diabetes and his hypothyroidism.  He is currently taking Ozempic 0.5 mg subcu weekly.  He has not increased to 1 mg yet.  His last hemoglobin A1c 3 months ago was 7.4.  He does not regularly check his sugar.  He denies any nausea or vomiting on the Ozempic.  He denies any chest pain.  Diabetic foot exam was performed today and is normal.  The patient has sensation to 10 g on the home.  Index loss is.  His pressure today is excellent.  He is due to recheck a TSH.  Last time we checked a TSH was 1 year ago.  This is overdue.  He denies any fatigue or rapid weight changes. Past Medical History:  Diagnosis Date   Anxiety    Aortic aneurysm (HCC)    dilated aortic root on echo 2024   Arthritis    "my back is loaded w/it" (01/06/2017)   CAD (coronary artery disease)    80% ostial lesion in L circumflex (2017)   Chronic lower back pain    GERD (gastroesophageal reflux disease)    Hiatal hernia    High cholesterol    Hypothyroidism    IBS (irritable bowel syndrome)    Numbness and tingling of right lower extremity    "since 11/30/2016" (01/06/2017)   PMR (polymyalgia rheumatica) (HCC)    Stroke (HCC)    Type 2 diabetes mellitus (HCC)    Past Surgical History:  Procedure Laterality Date   CARDIAC CATHETERIZATION  06/2016   CORONARY ANGIOPLASTY WITH STENT PLACEMENT  01/06/2017   CORONARY STENT INTERVENTION N/A 01/06/2017   Procedure: CORONARY STENT INTERVENTION;  Surgeon: Tonny Bollman, MD;  Location: [facility] INVASIVE CV LAB;  Service: Cardiovascular;  Laterality: N/A;   FINGER SURGERY Left 1984   ring finger reattached.    FOREARM FRACTURE SURGERY Right 2004   "has 3 rods and 22 screws in it"   INGUINAL HERNIA REPAIR Left    RIGHT/LEFT HEART CATH AND CORONARY ANGIOGRAPHY N/A 07/07/2016   Procedure: Right/Left Heart Cath and Coronary Angiography;   Surgeon: Dolores Patty, MD;  Location: [facility] INVASIVE CV LAB;  Service: Cardiovascular;  Laterality: N/A;   RIGHT/LEFT HEART CATH AND CORONARY ANGIOGRAPHY N/A 03/13/2018   Procedure: RIGHT/LEFT HEART CATH AND CORONARY ANGIOGRAPHY;  Surgeon: Dolores Patty, MD;  Location: [facility] INVASIVE CV LAB;  Service: Cardiovascular;  Laterality: N/A;   Current Outpatient Medications on File Prior to Visit  Medication Sig Dispense Refill   Accu-Chek FastClix Lancets MISC USE TO CHECK BLOOD SUGAR TWICE DAILY E11.9 102 each 12   acetaminophen (TYLENOL) 500 MG tablet Take 500-1,000 mg by mouth daily as needed for moderate pain or headache.     aspirin 81 MG EC tablet Take 81 mg by mouth at bedtime.      blood glucose meter kit and supplies Dispense based on patient and insurance preference. Use up to four times daily as directed. (FOR ICD-10 E10.9, E11.9). 1 each 0   Capsaicin 0.1 % CREA Apply topically as needed.     clopidogrel (PLAVIX) 75 MG tablet TAKE 1 TABLET BY MOUTH EVERY DAY 90 tablet 0   mometasone (ELOCON) 0.1 % ointment Apply topically daily. 45 g 0   Multiple Vitamin (MULTIVITAMIN WITH MINERALS) TABS tablet Take 1 tablet by mouth daily.  rosuvastatin (CRESTOR) 20 MG tablet TAKE 1 TABLET BY MOUTH EVERY DAY 90 tablet 3   Semaglutide,0.25 or 0.5MG /DOS, (OZEMPIC, 0.25 OR 0.5 MG/DOSE,) 2 MG/1.5ML SOPN Inject 0.5 mg into the skin once a week. 1.5 mL 3   sildenafil (VIAGRA) 100 MG tablet Take 0.5-1 tablets (50-100 mg total) by mouth daily as needed for erectile dysfunction. 5 tablet 11   Tiotropium Bromide-Olodaterol (STIOLTO RESPIMAT) 2.5-2.5 MCG/ACT AERS Inhale 2 puffs into the lungs daily. 1 each 5   Tiotropium Bromide-Olodaterol (STIOLTO RESPIMAT) 2.5-2.5 MCG/ACT AERS Inhale 2 puffs into the lungs daily.     vitamin B-12 (CYANOCOBALAMIN) 1000 MCG tablet Take 1,000 mcg by mouth daily.     No current facility-administered medications on file prior to visit.   Allergies  Allergen Reactions   Bee  Venom Swelling   Ceclor [Cefaclor] Hives   Ciprofloxacin Hives   Jardiance [Empagliflozin]    Metformin And Related Diarrhea    upsesomach   Social History   Socioeconomic History   Marital status: Married    Spouse name: Gavin Pound   Number of children: 0   Years of education: 12   Highest education level: Not on file  Occupational History    Comment: retired  Tobacco Use   Smoking status: Never   Smokeless tobacco: Never  Vaping Use   Vaping status: Never Used  Substance and Sexual Activity   Alcohol use: Not Currently    Alcohol/week: 2.0 standard drinks of alcohol    Types: 1 Glasses of wine, 1 Cans of beer per week    Comment: 1 beer/week   Drug use: No   Sexual activity: Not Currently  Other Topics Concern   Not on file  Social History Narrative   Retired. Lives with wife, Gavin Pound.    Caffeine- coffee, 1 daily   Social Determinants of Health   Financial Resource Strain: Low Risk  (05/20/2021)   Overall Financial Resource Strain (CARDIA)    Difficulty of Paying Living Expenses: Not hard at all  Food Insecurity: No Food Insecurity (05/20/2021)   Hunger Vital Sign    Worried About Running Out of Food in the Last Year: Never true    Ran Out of Food in the Last Year: Never true  Transportation Needs: No Transportation Needs (05/20/2021)   PRAPARE - Administrator, Civil Service (Medical): No    Lack of Transportation (Non-Medical): No  Physical Activity: Insufficiently Active (05/20/2021)   Exercise Vital Sign    Days of Exercise per Week: 5 days    Minutes of Exercise per Session: 20 min  Stress: No Stress Concern Present (05/20/2021)   [employer] of Occupational Health - Occupational Stress Questionnaire    Feeling of Stress : Only a little  Social Connections: Socially Integrated (05/20/2021)   Social Connection and Isolation Panel [NHANES]    Frequency of Communication with Friends and Family: More than three times a week    Frequency of  Social Gatherings with Friends and Family: More than three times a week    Attends Religious Services: More than 4 times per year    Active Member of [employer] or Organizations: Yes    Attends Banker Meetings: More than 4 times per year    Marital Status: Married  Catering manager Violence: Not At Risk (05/20/2021)   Humiliation, Afraid, Rape, and Kick questionnaire    Fear of Current or Ex-Partner: No    Emotionally Abused: No    Physically Abused: No  Sexually Abused: No   Family History  Problem Relation Age of Onset   Diabetes Mellitus II Father    Heart failure Father    Kidney failure Father    Heart attack Mother       Review of Systems  All other systems reviewed and are negative.      Objective:   Physical Exam Vitals reviewed.  Constitutional:      General: He is not in acute distress.    Appearance: He is well-developed. He is not diaphoretic.  HENT:     Head: Normocephalic and atraumatic.     Right Ear: External ear normal.     Left Ear: External ear normal.     Nose: Nose normal.     Mouth/Throat:     Pharynx: No oropharyngeal exudate.  Eyes:     General: No visual field deficit or scleral icterus.       Right eye: No discharge.        Left eye: No discharge.     Conjunctiva/sclera: Conjunctivae normal.     Pupils: Pupils are equal, round, and reactive to light.  Neck:     Thyroid: No thyromegaly.     Vascular: No JVD.     Trachea: No tracheal deviation.  Cardiovascular:     Rate and Rhythm: Normal rate and regular rhythm.     Heart sounds: Normal heart sounds. No murmur heard.    No friction rub. No gallop.  Pulmonary:     Effort: Pulmonary effort is normal. No respiratory distress.     Breath sounds: Normal breath sounds. No stridor. No wheezing or rales.  Chest:     Chest wall: No tenderness.  Abdominal:     General: Bowel sounds are normal. There is no distension.     Palpations: Abdomen is soft. There is no mass.      Tenderness: There is no abdominal tenderness. There is no guarding or rebound.  Musculoskeletal:        General: No tenderness.     Cervical back: Normal range of motion and neck supple.  Lymphadenopathy:     Cervical: No cervical adenopathy.  Skin:    General: Skin is warm.     Coloration: Skin is not pale.     Findings: No erythema or rash.  Neurological:     General: No focal deficit present.     Mental Status: He is alert and oriented to person, place, and time.     Cranial Nerves: No cranial nerve deficit, dysarthria or facial asymmetry.     Sensory: Sensation is intact. No sensory deficit.     Motor: Motor function is intact. No weakness, tremor, atrophy, abnormal muscle tone, seizure activity or pronator drift.     Coordination: Coordination is intact. Romberg sign negative. Coordination normal. Finger-Nose-Finger Test and Heel to [facility] Test normal.     Gait: Gait is intact. Gait and tandem walk normal.     Deep Tendon Reflexes: Reflexes are normal and symmetric.           Assessment & Plan:  Controlled type 2 diabetes mellitus with complication, without long-term current use of insulin (HCC) - Plan: Hemoglobin A1c, COMPLETE METABOLIC PANEL WITH GFR, Lipid panel, Microalbumin/Creatinine Ratio, Urine  Hypothyroidism, unspecified type - Plan: TSH I will recheck the patient's A1c today.  If his A1c is greater than 7 I would recommend increasing Ozempic to 1 mg subcu weekly.  I would also like to check the patient's TSH  today.  If his TSH is normal, we will continue him on his current dose of levothyroxine.  Meanwhile check a urine protein creatinine ratio.  If protein creatinine ratio significantly elevated, consider adding [place].

## 2023-04-05 LAB — MICROALBUMIN / CREATININE URINE RATIO
Creatinine, Urine: 93 mg/dL (ref 20–320)
Microalb Creat Ratio: 48 mg/g{creat} — ABNORMAL HIGH (ref <30)
Microalb, Ur: 4.5 mg/dL

## 2023-04-05 LAB — PULMONARY FUNCTION TEST
DL/VA % pred: 111 %
DL/VA: 4.42 ml/min/mmHg/L
DLCO cor % pred: 84 %
DLCO cor: 20.43 ml/min/mmHg
DLCO unc % pred: 84 %
DLCO unc: 20.43 ml/min/mmHg
FEF 25-75 Post: 1.65 L/s
FEF 25-75 Pre: 1.63 L/s
FEF2575-%Change-Post: 1 %
FEF2575-%Pred-Post: 79 %
FEF2575-%Pred-Pre: 78 %
FEV1-%Change-Post: 1 %
FEV1-%Pred-Post: 61 %
FEV1-%Pred-Pre: 60 %
FEV1-Post: 1.8 L
FEV1-Pre: 1.77 L
FEV1FVC-%Change-Post: 2 %
FEV1FVC-%Pred-Pre: 107 %
FEV6-%Change-Post: -1 %
FEV6-%Pred-Post: 58 %
FEV6-%Pred-Pre: 60 %
FEV6-Post: 2.22 L
FEV6-Pre: 2.26 L
FEV6FVC-%Pred-Post: 106 %
FEV6FVC-%Pred-Pre: 106 %
FVC-%Change-Post: -1 %
FVC-%Pred-Post: 55 %
FVC-%Pred-Pre: 56 %
FVC-Post: 2.24 L
FVC-Pre: 2.26 L
Post FEV1/FVC ratio: 80 %
Post FEV6/FVC ratio: 100 %
Pre FEV1/FVC ratio: 78 %
Pre FEV6/FVC Ratio: 100 %
RV % pred: 77 %
RV: 1.96 L
TLC % pred: 64 %
TLC: 4.43 L

## 2023-04-05 LAB — LIPID PANEL
Cholesterol: 103 mg/dL (ref <200)
HDL: 40 mg/dL (ref >=40)
LDL Cholesterol (Calc): 35 mg/dL
Non-HDL Cholesterol (Calc): 63 mg/dL (ref <130)
Total CHOL/HDL Ratio: 2.6 (calc) (ref <5.0)
Triglycerides: 212 mg/dL — ABNORMAL HIGH (ref <150)

## 2023-04-05 LAB — COMPLETE METABOLIC PANEL WITH GFR
AG Ratio: 1.9 (calc) (ref 1.0–2.5)
ALT: 16 U/L (ref 9–46)
AST: 20 U/L (ref 10–35)
Albumin: 4.1 g/dL (ref 3.6–5.1)
Alkaline phosphatase (APISO): 59 U/L (ref 35–144)
BUN: 19 mg/dL (ref 7–25)
CO2: 25 mmol/L (ref 20–32)
Calcium: 9 mg/dL (ref 8.6–10.3)
Chloride: 106 mmol/L (ref 98–110)
Creat: 1.2 mg/dL (ref 0.70–1.28)
Globulin: 2.2 g/dL (ref 1.9–3.7)
Glucose, Bld: 163 mg/dL — ABNORMAL HIGH (ref 65–99)
Potassium: 4.2 mmol/L (ref 3.5–5.3)
Sodium: 140 mmol/L (ref 135–146)
Total Bilirubin: 0.3 mg/dL (ref 0.2–1.2)
Total Protein: 6.3 g/dL (ref 6.1–8.1)
eGFR: 63 mL/min/{1.73_m2} (ref >=60)

## 2023-04-05 LAB — HEMOGLOBIN A1C
Hgb A1c MFr Bld: 6.8 %{Hb} — ABNORMAL HIGH (ref <5.7)
Mean Plasma Glucose: 148 mg/dL
eAG (mmol/L): 8.2 mmol/L

## 2023-04-05 LAB — TSH: TSH: 2.24 m[IU]/L (ref 0.40–4.50)

## 2023-04-06 ENCOUNTER — Other Ambulatory Visit: Payer: Self-pay | Admitting: Family Medicine

## 2023-04-06 DIAGNOSIS — I25119 Atherosclerotic heart disease of native coronary artery with unspecified angina pectoris: Secondary | ICD-10-CM

## 2023-04-06 NOTE — Telephone Encounter (Signed)
Requested Prescriptions  Pending Prescriptions Disp Refills   clopidogrel (PLAVIX) 75 MG tablet [Pharmacy Med Name: CLOPIDOGREL 75 MG TABLET] 90 tablet 0    Sig: TAKE 1 TABLET BY MOUTH EVERY DAY     Hematology: Antiplatelets - clopidogrel Failed - 04/06/2023  2:42 AM      Failed - Valid encounter within last 6 months    Recent Outpatient Visits           1 year ago New daily persistent headache   [facility] Tanya Nones, Priscille Heidelberg, MD   2 years ago Rib pain on right side   [facility] Tanya Nones, Priscille Heidelberg, MD   2 years ago Dyspnea on exertion   [facility] Donita Brooks, MD   3 years ago Dyspnea on exertion   [facility] Donita Brooks, MD   3 years ago Memory loss   [facility] Pickard, Priscille Heidelberg, MD       Future Appointments             In 2 months Branch, Dorothe Pea, MD [facility] [place], [place]   In 2 months Chilton Greathouse, MD [facility] - HCT in normal range and within 180 days    HCT  Date Value Ref Range Status  03/21/2023 44.5 39.0 - 52.0 % Final         Passed - HGB in normal range and within 180 days    Hemoglobin  Date Value Ref Range Status  03/21/2023 14.7 13.0 - 17.0 g/dL Final         Passed - PLT in normal range and within 180 days    Platelets  Date Value Ref Range Status  03/21/2023 232.0 150.0 - 400.0 K/uL Final         Passed - Cr in normal range and within 360 days    Creat  Date Value Ref Range Status  04/04/2023 1.20 0.70 - 1.28 mg/dL Final   Creatinine, POC  Date Value Ref Range Status  12/30/2022 239.3 mg/dL Final    Comment:    Abstracted by HIM   Creatinine, Urine  Date Value Ref Range Status  04/04/2023 93 20 - 320 mg/dL Final

## 2023-04-13 NOTE — Telephone Encounter (Signed)
Lab work showed your autoimmune panel was negative.  Hypersensitivity panel was negative Your allergen level was very elevated IgE was 1806. Need to check to see when high-resolution CT chest has been set up.  Once we have these results back we can decide what our next step is and what additional workup is indicated Recommend beginning Zyrtec 10 mg daily. Begin Singulair 10mg  daily #30 5 refills . Needs ov with dr. Isaiah Serge or ramasamy in ILD slot . Left message on machine to return call  Both patient and patient's spouse, Caleb Butler(DPR) are aware of results and voiced their understanding.  Results have been mailed to address on file per patient request.  He will keep scheduled appt with Dr. Isaiah Serge and CT. Nothing further needed.

## 2023-05-05 ENCOUNTER — Other Ambulatory Visit: Payer: Self-pay | Admitting: Cardiology

## 2023-05-12 ENCOUNTER — Ambulatory Visit (HOSPITAL_COMMUNITY)
Admission: RE | Admit: 2023-05-12 | Discharge: 2023-05-12 | Disposition: A | Discharge status: Home / Self Care | Payer: No Typology Code available for payment source | Source: Ambulatory Visit | Attending: Diagnostic Radiology | Admitting: Diagnostic Radiology

## 2023-05-12 DIAGNOSIS — I7121 Aneurysm of the ascending aorta, without rupture: Secondary | ICD-10-CM | POA: Insufficient documentation

## 2023-05-12 DIAGNOSIS — J84112 Idiopathic pulmonary fibrosis: Secondary | ICD-10-CM | POA: Insufficient documentation

## 2023-05-12 DIAGNOSIS — D1779 Benign lipomatous neoplasm of other sites: Secondary | ICD-10-CM | POA: Insufficient documentation

## 2023-05-12 DIAGNOSIS — J849 Interstitial pulmonary disease, unspecified: Secondary | ICD-10-CM | POA: Diagnosis present

## 2023-05-16 ENCOUNTER — Telehealth: Payer: Self-pay

## 2023-05-16 NOTE — Telephone Encounter (Signed)
Prescription Request  05/16/2023  LOV: 04/04/23  What is the name of the medication or equipment? traZODone (DESYREL) 50 MG tablet [[id number]]  DISCONTINUED   Have you contacted your pharmacy to request a refill? Yes   Which pharmacy would you like this sent to?  [id number] [place], [place] - [address] [place] [address] Phone: [phone] Fax: [phone]    Patient notified that their request is being sent to the clinical staff for review and that they should receive a response within 2 business days.   Please advise at [facility] [phone]

## 2023-05-30 ENCOUNTER — Other Ambulatory Visit: Payer: Self-pay | Admitting: Family Medicine

## 2023-05-30 NOTE — Telephone Encounter (Signed)
 Prescription Request  05/30/2023  LOV: 04/04/2023  What is the name of the medication or equipment? Accu-Chek FastClix Lancets MISC   Have you contacted your pharmacy to request a refill? Yes   Which pharmacy would you like this sent to?  [id number] [place], [place] - [address] [place] [address] Phone: [phone] Fax: [phone]    Patient notified that their request is being sent to the clinical staff for review and that they should receive a response within 2 business days.   Please advise at [facility] [phone]

## 2023-06-01 MED ORDER — ACCU-CHEK FASTCLIX LANCETS MISC: 12 refills | Status: AC

## 2023-06-19 ENCOUNTER — Encounter: Payer: Self-pay | Admitting: Pulmonary Disease

## 2023-06-19 ENCOUNTER — Ambulatory Visit: Payer: No Typology Code available for payment source | Admitting: Pulmonary Disease

## 2023-06-19 VITALS — BP 114/74 | HR 78 | Ht 70.0 in | Wt 180.4 lb

## 2023-06-19 DIAGNOSIS — J849 Interstitial pulmonary disease, unspecified: Secondary | ICD-10-CM

## 2023-06-19 DIAGNOSIS — R040 Epistaxis: Secondary | ICD-10-CM

## 2023-06-19 DIAGNOSIS — I2510 Atherosclerotic heart disease of native coronary artery without angina pectoris: Secondary | ICD-10-CM | POA: Diagnosis not present

## 2023-06-19 NOTE — Patient Instructions (Signed)
VISIT SUMMARY:  During today's visit, we discussed your ongoing shortness of breath, recent nosebleeds, and your history of coronary artery disease. We reviewed your current medications and made some adjustments to better manage your symptoms.  YOUR PLAN:  -SHORTNESS OF BREATH: You have been experiencing shortness of breath, especially when you exert yourself. This may be related to mild interstitial lung disease from past asbestos exposure. We recommend you continue exercising and increase your activity level as much as you can tolerate. We will stop the Stiolto inhaler since it hasn't helped and you don't have COPD.  -EPISTAXIS (NOSEBLEEDS): You have had recent nosebleeds, which might be caused by taking Zyrtec or clopidogrel. We will stop the Zyrtec to see if the nosebleeds improve.  -CORONARY ARTERY DISEASE: You have a history of coronary artery disease and have had stents placed. You should continue taking clopidogrel and aspirin as prescribed to help prevent any further heart issues.  INSTRUCTIONS:  Please follow up in 6 months to reassess your symptoms and adjust your treatment plan as necessary.

## 2023-06-19 NOTE — Progress Notes (Signed)
Caleb Butler    [id number]    [birth year]  Primary Care Physician:Pickard, Priscille Heidelberg, MD  Referring Physician: Donita Brooks, MD [address] [place],  [place] [address]  Chief complaint: Follow-up for interstitial lung disease  HPI: 78 y.o. who  has a past medical history of Anxiety, Aortic aneurysm (HCC), Arthritis, CAD (coronary artery disease), Chronic lower back pain, GERD (gastroesophageal reflux disease), Hiatal hernia, High cholesterol, Hypothyroidism, IBS (irritable bowel syndrome), Numbness and tingling of right lower extremity, PMR (polymyalgia rheumatica) (HCC), Stroke (HCC), and Type 2 diabetes mellitus (HCC).   Discussed the use of AI scribe software for clinical note transcription with the patient, who gave verbal consent to proceed.  The patient, with a history of coronary artery disease and stents, presents with shortness of breath, particularly with exertion. He describes difficulty climbing stairs and walking due to breathlessness, but reports feeling 'pretty good at rest.' He denies significant cough or sputum production. He has been on Stiolto inhaler for two months, but reports no noticeable improvement in symptoms. He also reports nosebleeds since starting Zyrtec, which was prescribed for blocked ears.  He was previously followed by Dr. Kendrick Fries with CT findings of mild interstitial lung disease that has been stable for many years  He had a cardiopulmonary exercise test in 2017 with findings suggestive of pulmonary vascular disease however follow-up right heart cath with no evidence of pulmonary hypertension.  Coronary artery disease is stable  The patient has a history of exposure to asbestos and Agent Orange, but denies significant smoking history. 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 He denies  any family history of lung disease.   Pets: 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   Exposures: He was exposed to asbestos and agent orange while in [employer].  No mold, hot tub, Jacuzzi.  No feather pillows or comforters Smoking history: Never smoker Travel history: Originally from [place].  No significant recent travel Relevant family history: No family history of lung disease   Outpatient Encounter Medications as of 06/19/2023  Medication Sig   Accu-Chek FastClix Lancets MISC USE TO CHECK BLOOD SUGAR TWICE DAILY E11.9   acetaminophen (TYLENOL) 500 MG tablet Take 500-1,000 mg by mouth daily as needed for moderate pain or headache.   aspirin 81 MG EC tablet Take 81 mg by mouth at bedtime.    blood glucose meter kit and supplies Dispense based on patient and insurance preference. Use up to four times daily as directed. (FOR ICD-10 E10.9, E11.9).   Blood Glucose Monitoring Suppl DEVI 1 each by Does not apply route in the morning, at noon, and at bedtime. May substitute to any manufacturer covered by patient's insurance. Needs Onetouch by [employer]   Capsaicin 0.1 % CREA Apply topically as needed.   clopidogrel (PLAVIX) 75 MG tablet TAKE 1 TABLET BY MOUTH EVERY DAY   levothyroxine (SYNTHROID) 75 MCG tablet Take 1 tablet (75 mcg total) by mouth daily.   mometasone (ELOCON) 0.1 % ointment Apply topically daily.   Multiple Vitamin (MULTIVITAMIN WITH MINERALS) TABS tablet Take 1 tablet by mouth daily.   rosuvastatin (CRESTOR) 20 MG tablet TAKE 1 TABLET BY MOUTH EVERY DAY   Semaglutide,0.25 or 0.5MG /DOS, (OZEMPIC, 0.25 OR 0.5 MG/DOSE,) 2 MG/1.5ML SOPN Inject 0.5  mg into the skin once a week.   Tiotropium Bromide-Olodaterol (STIOLTO RESPIMAT) 2.5-2.5 MCG/ACT AERS Inhale 2 puffs into the lungs daily.   Tiotropium Bromide-Olodaterol (STIOLTO RESPIMAT) 2.5-2.5 MCG/ACT AERS Inhale 2 puffs into the lungs daily.   vitamin B-12  (CYANOCOBALAMIN) 1000 MCG tablet Take 1,000 mcg by mouth daily.   [DISCONTINUED] sildenafil (VIAGRA) 100 MG tablet Take 0.5-1 tablets (50-100 mg total) by mouth daily as needed for erectile dysfunction.   No facility-administered encounter medications on file as of 06/19/2023.   Physical Exam: Blood pressure 114/74, pulse 78, height 5\' 10"  (1.778 m), weight 180 lb 6.4 oz (81.8 kg), SpO2 97%. Gen:      No acute distress HEENT:  EOMI, sclera anicteric Neck:     No masses; no thyromegaly Lungs:    Clear to auscultation bilaterally; normal respiratory effort CV:         Regular rate and rhythm; no murmurs Abd:      + bowel sounds; soft, non-tender; no palpable masses, no distension Ext:    No edema; adequate peripheral perfusion Skin:      Warm and dry; no rash Neuro: alert and oriented x 3 Psych: normal mood and affect  Data Reviewed: Imaging: CT high-resolution 05/12/2023 Mild subpleural coarsening, reticular densities, stable compared to prior. Indeterminate for UIP.  Reviewed images personally.  PFTs: 03/21/2023 FVC 2.24 [55%], FEV1 1.80 [61%], F/F80, TLC 4.43 [64%], DLCO 20.43 [84%] Moderate restriction  Labs: ILD serologies 03/21/2023-negative IgE 03/21/2023 1806  Cardiac: Cardiopulmonary exercise test 05/06/2016 Conclusion: Exercise testing with gas exchange demonstrates a normal functional capacity when compared to matched sedentary norms.  There is no indication for exercise-induced bronchospasm. There is no clear indication for cardiovascular limitation. Although he has normal exercise capacity, he may be primarily symptomatic due to ventilatory limitations. The pattern of no breathing reserve, excessive dead space ventilation (Elevated VE/VCO2 slope) may indicate subtle ILD or pulmonary vascular process. If a right and left heart cath is performed we suggest measuring right heart pressures before and with exertion to check for exercise induced pulmonary hypertension.  Pre-exercise restrictive patterns and exceeding of ventilatory limits at peak exercise may also be related to patient's body habitus, which is contributing to dyspnea.   Echocardiogram 02/16/2023-LVEF 55-60%, grade 1 diastolic dysfunction, normal PA systolic pressure  Cardiac cath 03/13/2018-coronary artery disease with patent stent Normal right heart cath numbers with no evidence of pulmonary hypertension  Assessment and Plan Interstitial lung disease Exertional dyspnea with no cough or sputum production. Stable CT scan findings over several years with mild interstitial lung disease possibly secondary to past asbestos exposure. Lung function tests show stable oxygenation with decreased lung volumes.  Given mild changes on CT scan and stability will continue to monitor.  CTD serologies are negative  -Encouraged to continue exercise and increase activity level as tolerated. -Discontinue Stiolto inhaler due to lack of perceived benefit and absence of COPD diagnosis.  Epistaxis Recent onset of nosebleeds, possibly related to Zyrtec or clopidogrel use. -Discontinue Zyrtec to assess for resolution of nosebleeds.  Coronary Artery Disease History of stent placement, currently on clopidogrel and aspirin. -Continue clopidogrel and aspirin as prescribed.  Follow-up in 6 months to reassess symptoms and adjust treatment plan as necessary.   Recommendations: Encourage exercise Discontinue Stiolto Follow-up in 6 months  Chilton Greathouse MD [facility] 06/19/2023, 3:06 PM  CC: Donita Brooks, MD

## 2023-06-21 ENCOUNTER — Ambulatory Visit: Payer: Medicare HMO | Admitting: Cardiology

## 2023-06-23 ENCOUNTER — Ambulatory Visit: Payer: Medicare HMO | Attending: Cardiology | Admitting: Cardiology

## 2023-06-23 ENCOUNTER — Encounter: Payer: Self-pay | Admitting: Cardiology

## 2023-06-23 VITALS — BP 114/74 | HR 81 | Ht 70.0 in | Wt 178.2 lb

## 2023-06-23 DIAGNOSIS — E782 Mixed hyperlipidemia: Secondary | ICD-10-CM

## 2023-06-23 DIAGNOSIS — R0602 Shortness of breath: Secondary | ICD-10-CM

## 2023-06-23 DIAGNOSIS — I7121 Aneurysm of the ascending aorta, without rupture: Secondary | ICD-10-CM

## 2023-06-23 DIAGNOSIS — I2510 Atherosclerotic heart disease of native coronary artery without angina pectoris: Secondary | ICD-10-CM | POA: Diagnosis not present

## 2023-06-23 DIAGNOSIS — J849 Interstitial pulmonary disease, unspecified: Secondary | ICD-10-CM | POA: Diagnosis not present

## 2023-06-23 NOTE — Progress Notes (Signed)
Clinical Summary Mr. Gehl is a 78 y.o.male seen today for follow up of the following medical problems.    1.CAD - PCI to ostial LCX in 2018, given stent location indefinitite DAPT was recommended - relook caht 02/2018 with stable findings, does have residual 90% diag disease that recs were for medical management.  - 02/2018 echo LVEF 55-60%, no WMAs, grade I dd, mild MR, mild AI   - 03/2020 nuclear stress: apical scar with mild ischemia - 03/2020 echo LVEF 55-60%, no WMAs, indet diasotlic fxn, mild to mod MR, mild AI, ascending aorta 41mm  - 01/2023 echo: LVEF 55-60%, no WMAs, grade I dd, mild to mod MR, mild AI, ascending aorta 4.4 cm.   - no specific chest pains - ongoing SOB/DOE that is chronic    2. ILD - followed by pulmonary  - PFTs moderate restriction - 04/2023 suggestive of ILD - some wheezing at times    3. Aortic dilatation - 04/2020 CTA: ascending aorta 3.9 cm - 01/2022 CT high res chest: 4.1 cm ascending aortic aneurysm 04/2023 high rest CT: 4.1 cm ascending aorta  *getting regular high CT chest for his ILD, we have followed results regarding his aneurysm as opposed to ordering separate CTAs   4. Hyperlipidemia - compliant with meds, reports recent labs with rheumatology 01/2022 TC 116 HDL 45 TG 187 LDL 45 - lab followed by pcp  03/2023 TC 103 HDL 40 TG 212 LDL 35   5. Polymyalgia rheumatica - followed by rheumatology  6. Mitral regurgitation - 01/2023 echo: LVEF 55-60%, no WMAs, grade I dd, mild to mod MR, mild AI, ascending aorta 4.4 cm.   Past Medical History:  Diagnosis Date   Anxiety    Aortic aneurysm (HCC)    dilated aortic root on echo 2024   Arthritis    "my back is loaded w/it" (01/06/2017)   CAD (coronary artery disease)    80% ostial lesion in L circumflex (2017)   Chronic lower back pain    GERD (gastroesophageal reflux disease)    Hiatal hernia    High cholesterol    Hypothyroidism    IBS (irritable bowel syndrome)     Numbness and tingling of right lower extremity    "since 11/30/2016" (01/06/2017)   PMR (polymyalgia rheumatica) (HCC)    Stroke (HCC)    Type 2 diabetes mellitus (HCC)      Allergies  Allergen Reactions   Bee Venom Swelling   Ceclor [Cefaclor] Hives   Ciprofloxacin Hives   Jardiance [Empagliflozin]    Metformin And Related Diarrhea    upsesomach     Current Outpatient Medications  Medication Sig Dispense Refill   Accu-Chek FastClix Lancets MISC USE TO CHECK BLOOD SUGAR TWICE DAILY E11.9 102 each 12   acetaminophen (TYLENOL) 500 MG tablet Take 500-1,000 mg by mouth daily as needed for moderate pain or headache.     aspirin 81 MG EC tablet Take 81 mg by mouth at bedtime.      blood glucose meter kit and supplies Dispense based on patient and insurance preference. Use up to four times daily as directed. (FOR ICD-10 E10.9, E11.9). 1 each 0   Blood Glucose Monitoring Suppl DEVI 1 each by Does not apply route in the morning, at noon, and at bedtime. May substitute to any manufacturer covered by patient's insurance. Needs Onetouch by [employer] 1 each 0   Capsaicin 0.1 % CREA Apply topically as needed.     clopidogrel (PLAVIX)  75 MG tablet TAKE 1 TABLET BY MOUTH EVERY DAY 90 tablet 0   levothyroxine (SYNTHROID) 75 MCG tablet Take 1 tablet (75 mcg total) by mouth daily. 90 tablet 3   mometasone (ELOCON) 0.1 % ointment Apply topically daily. 45 g 0   Multiple Vitamin (MULTIVITAMIN WITH MINERALS) TABS tablet Take 1 tablet by mouth daily.     rosuvastatin (CRESTOR) 20 MG tablet TAKE 1 TABLET BY MOUTH EVERY DAY 90 tablet 3   Semaglutide,0.25 or 0.5MG /DOS, (OZEMPIC, 0.25 OR 0.5 MG/DOSE,) 2 MG/1.5ML SOPN Inject 0.5 mg into the skin once a week. 1.5 mL 3   Tiotropium Bromide-Olodaterol (STIOLTO RESPIMAT) 2.5-2.5 MCG/ACT AERS Inhale 2 puffs into the lungs daily. 1 each 5   Tiotropium Bromide-Olodaterol (STIOLTO RESPIMAT) 2.5-2.5 MCG/ACT AERS Inhale 2 puffs into the lungs daily.     vitamin B-12  (CYANOCOBALAMIN) 1000 MCG tablet Take 1,000 mcg by mouth daily.     No current facility-administered medications for this visit.     Past Surgical History:  Procedure Laterality Date   CARDIAC CATHETERIZATION  06/2016   CORONARY ANGIOPLASTY WITH STENT PLACEMENT  01/06/2017   CORONARY STENT INTERVENTION N/A 01/06/2017   Procedure: CORONARY STENT INTERVENTION;  Surgeon: Tonny Bollman, MD;  Location: [facility] INVASIVE CV LAB;  Service: Cardiovascular;  Laterality: N/A;   FINGER SURGERY Left 1984   ring finger reattached.    FOREARM FRACTURE SURGERY Right 2004   "has 3 rods and 22 screws in it"   INGUINAL HERNIA REPAIR Left    RIGHT/LEFT HEART CATH AND CORONARY ANGIOGRAPHY N/A 07/07/2016   Procedure: Right/Left Heart Cath and Coronary Angiography;  Surgeon: Dolores Patty, MD;  Location: [facility] INVASIVE CV LAB;  Service: Cardiovascular;  Laterality: N/A;   RIGHT/LEFT HEART CATH AND CORONARY ANGIOGRAPHY N/A 03/13/2018   Procedure: RIGHT/LEFT HEART CATH AND CORONARY ANGIOGRAPHY;  Surgeon: Dolores Patty, MD;  Location: [facility] INVASIVE CV LAB;  Service: Cardiovascular;  Laterality: N/A;     Allergies  Allergen Reactions   Bee Venom Swelling   Ceclor [Cefaclor] Hives   Ciprofloxacin Hives   Jardiance [Empagliflozin]    Metformin And Related Diarrhea    upsesomach      Family History  Problem Relation Age of Onset   Diabetes Mellitus II Father    Heart failure Father    Kidney failure Father    Heart attack Mother      Social History Mr. Edmondson reports that he has never smoked. He has never used smokeless tobacco. Mr. Krabill reports that he does not currently use alcohol after a past usage of about 2.0 standard drinks of alcohol per week.      Physical Examination Today's Vitals   06/23/23 1520  BP: 114/74  Pulse: 81  SpO2: 97%  Weight: 178 lb 3.2 oz (80.8 kg)  Height: 5\' 10"  (1.778 m)   Body mass index is 25.57 kg/m.  Gen: resting comfortably, no acute  distress HEENT: no scleral icterus, pupils equal round and reactive, no palptable cervical adenopathy,  CV: RRR, no m/rg, no jvd Resp: Clear to auscultation bilaterally GI: abdomen is soft, non-tender, non-distended, normal bowel sounds, no hepatosplenomegaly MSK: extremities are warm, no edema.  Skin: warm, no rash Neuro:  no focal deficits Psych: appropriate affect   Diagnostic Studies  06/2016 cath The left ventricular ejection fraction is 55-65% by visual estimate. Prox RCA lesion, 40 %stenosed. Ost Cx lesion, 90 %stenosed. Ramus lesion, 90 %stenosed. LM lesion, 20 %stenosed. Mid LAD lesion, 20 %stenosed. 1st Diag  lesion, 80 %stenosed.   Findings:   RA = 5 RV = 23/8 PA = 27/4 (16) PCW = 3 Fick cardiac output/index = 5.2/2.6 PVR = 2.5 WU  Ao sat =95% PA sat = 68%, 69%   Assessment: 1. 1-V CAD at ostium of LCX alsi involving ostium of ramus 2. Aneursymal proximal LAD with mild plaque 3. 40% Proximal RCA 4. Normal LVEF with normal hemodynamics   Plan/Discussion:   Case reviewed with Dr. Excell Seltzer. He has high grade ostial lesion in ostium of LCX also involving Ramus Makennah Omura. It is a moderate-sized system which is not completely favorable to PCI. Will proceed with ETT/Myvoview. If high ischemic burden will plan angioplasty +/- stenting. Otherwise proceed with aggressive medical therapy.    12/2016 PCI Successful PCI of severe ostial stenosis in the left circumflex, reducing the stenosis from 90% to 0% with Cutting Balloon angioplasty followed by drug-eluting stent implantation.   Recommendations: Aspirin and Plavix for a minimum of 12 months. If tolerated favor long-term DAPT as the stent is positioned at the left main bifurcation.     02/2018 RHC/LHC LM lesion is 20% stenosed. Ramus lesion is 90% stenosed. Prox RCA lesion is 40% stenosed. Ost 1st Diag to 1st Diag lesion is 90% stenosed. Prox LAD lesion is 30% stenosed. Previously placed Ost Cx to Prox Cx stent  (unknown type) is widely patent. Ost LM to Mid LM lesion is 20% stenosed.     Findings:   RA = 7 RV = 32/9 PA = 33/10 (20) PCW = 10 Fick cardiac output/index = 5.4/2.7 PVR = 1.9 WU Ao sat = 93% PA sat = 64%, 68%   Assessment: 1. CAD with widely patent stent in ostial LCX 2. Unchanged 90% lesion in ostium of moderate-sized first diagonal 3. Otherwise mild non-obstructive CAD 4. EF 55-60% 6. Normal RHC numbers     02/2018 echo Study Conclusions   - Left ventricle: The cavity size was normal. Wall thickness was    increased in a pattern of mild LVH. Systolic function was normal.    The estimated ejection fraction was in the range of 55% to 60%.    Wall motion was normal; there were no regional wall motion    abnormalities. Doppler parameters are consistent with abnormal    left ventricular relaxation (grade 1 diastolic dysfunction).  - Aortic valve: There was mild regurgitation.  - Mitral valve: There was mild regurgitation.    03/2020 nuclear stress No diagnostic ST segment changes to indicate ischemia. Small, mild intensity, apical to basal inferolateral defect that is partially reversible mainly at the apex. This is consistent with scar and mild peri-infarct ischemia. This is a low risk study. Nuclear stress EF: 62%.   03/2020 echo IMPRESSIONS     1. Left ventricular ejection fraction, by estimation, is 55 to 60%. The  left ventricle has normal function. The left ventricle has no regional  wall motion abnormalities. Left ventricular diastolic parameters are  indeterminate.   2. Right ventricular systolic function is normal. The right ventricular  size is normal. There is normal pulmonary artery systolic pressure. The  estimated right ventricular systolic pressure is 19.3 mmHg.   3. The mitral valve is grossly normal. Mild to moderate mitral valve  regurgitation.   4. The aortic valve is tricuspid. Aortic valve regurgitation is mild.  Aortic regurgitation PHT  measures 528 msec.   5. Aortic dilatation noted. There is mild dilatation of the ascending  aorta, measuring 41 mm. There is mild  dilatation of the aortic root,  measuring 40 mm.   6. The inferior vena cava is normal in size with greater than 50%  respiratory variability, suggesting right atrial pressure of 3 mmHg.      Assessment and Plan   CAD - no recent chest pains - chronic SOB/DOE, cardiac testing has been benign. Followed by pulmonary for mild ILD. He is interested in pulmonary rehab, we will refer   2. Hyperlipidemia -LDL at goal, discussed dietary changes to lower TGs     3.Aortic aneurysm - mild stable aneurysm - he is getting annual CT's with pulmonary, we have not had to order separate CTA's - continue to monitor     4.Mitral regurgitation - mild to moderate MR by recent echo, repeat 2-3 years.      Antoine Poche, M.D.

## 2023-06-23 NOTE — Patient Instructions (Addendum)
Medication Instructions:   Continue all current medications.   Labwork:  none  Testing/Procedures:  none  Follow-Up:  6 months   Any Other Special Instructions Will Be Listed Below (If Applicable).  You have been referred to:  Pulmonary Rehab   If you need a refill on your cardiac medications before your next appointment, please call your pharmacy.

## 2023-06-26 ENCOUNTER — Ambulatory Visit: Payer: No Typology Code available for payment source | Admitting: Pulmonary Disease

## 2023-06-28 ENCOUNTER — Ambulatory Visit: Payer: Medicare HMO | Admitting: Cardiology

## 2023-07-04 ENCOUNTER — Encounter (HOSPITAL_COMMUNITY)
Admission: RE | Admit: 2023-07-04 | Discharge: 2023-07-04 | Disposition: A | Discharge status: Home / Self Care | Payer: No Typology Code available for payment source | Source: Ambulatory Visit | Attending: Cardiology | Admitting: Cardiology

## 2023-07-04 VITALS — Ht 68.0 in | Wt 178.5 lb

## 2023-07-04 DIAGNOSIS — R0602 Shortness of breath: Secondary | ICD-10-CM | POA: Insufficient documentation

## 2023-07-04 DIAGNOSIS — J849 Interstitial pulmonary disease, unspecified: Secondary | ICD-10-CM | POA: Diagnosis present

## 2023-07-04 NOTE — Progress Notes (Signed)
 Pulmonary Individual Treatment Plan  Patient Details  Name: Caleb Butler MRN: [id number] Date of Birth: 1946-01-06 Referring Provider:   Flowsheet Row PULMONARY REHAB OTHER RESP ORIENTATION from 07/04/2023 in [facility] RE[facility]  Referring Provider Alvan Carrier MD  [Primary Pulmonologist: Dr. Brown       Initial Encounter Date:  Flowsheet Row PULMONARY REHAB OTHER RESP ORIENTATION from 07/04/2023 in [facility]  Date 07/04/23       Visit Diagnosis: ILD (interstitial lung disease) (HCC)  SOB (shortness of breath)  Patient's Home Medications on Admission:   Current Outpatient Medications:    Accu-Chek FastClix Lancets MISC, USE TO CHECK BLOOD SUGAR TWICE DAILY E11.9, Disp: 102 each, Rfl: 12   acetaminophen  (TYLENOL ) 500 MG tablet, Take 500-1,000 mg by mouth daily as needed for moderate pain or headache., Disp: , Rfl:    aspirin  81 MG EC tablet, Take 81 mg by mouth at bedtime. , Disp: , Rfl:    blood glucose meter kit and supplies, Dispense based on patient and insurance preference. Use up to four times daily as directed. (FOR ICD-10 E10.9, E11.9)., Disp: 1 each, Rfl: 0   Blood Glucose Monitoring Suppl DEVI, 1 each by Does not apply route in the morning, at noon, and at bedtime. May substitute to any manufacturer covered by patient's insurance. Needs Onetouch by [employer], Disp: 1 each, Rfl: 0   Capsaicin 0.1 % CREA, Apply topically as needed., Disp: , Rfl:    clopidogrel  (PLAVIX ) 75 MG tablet, TAKE 1 TABLET BY MOUTH EVERY DAY, Disp: 90 tablet, Rfl: 0   levothyroxine  (SYNTHROID ) 75 MCG tablet, Take 1 tablet (75 mcg total) by mouth daily., Disp: 90 tablet, Rfl: 3   mometasone  (ELOCON ) 0.1 % ointment, Apply topically daily., Disp: 45 g, Rfl: 0   Multiple Vitamin (MULTIVITAMIN WITH MINERALS) TABS tablet, Take 1 tablet by mouth daily., Disp: , Rfl:    rosuvastatin  (CRESTOR ) 20 MG tablet, TAKE 1 TABLET BY MOUTH EVERY DAY, Disp: 90 tablet, Rfl:  3   Semaglutide ,0.25 or 0.5MG /DOS, (OZEMPIC , 0.25 OR 0.5 MG/DOSE,) 2 MG/1.5ML SOPN, Inject 0.5 mg into the skin once a week., Disp: 1.5 mL, Rfl: 3   Tiotropium Bromide-Olodaterol (STIOLTO RESPIMAT ) 2.5-2.5 MCG/ACT AERS, Inhale 2 puffs into the lungs daily., Disp: 1 each, Rfl: 5   vitamin B-12 (CYANOCOBALAMIN) 1000 MCG tablet, Take 1,000 mcg by mouth daily., Disp: , Rfl:   Past Medical History: Past Medical History:  Diagnosis Date   Anxiety    Aortic aneurysm (HCC)    dilated aortic root on echo 2024   Arthritis    my back is loaded w/it (01/06/2017)   CAD (coronary artery disease)    80% ostial lesion in L circumflex (2017)   Chronic lower back pain    GERD (gastroesophageal reflux disease)    Hiatal hernia    High cholesterol    Hypothyroidism    IBS (irritable bowel syndrome)    Numbness and tingling of right lower extremity    since 11/30/2016 (01/06/2017)   PMR (polymyalgia rheumatica) (HCC)    Stroke (HCC)    Type 2 diabetes mellitus (HCC)     Tobacco Use: Social History   Tobacco Use  Smoking Status Never  Smokeless Tobacco Never    Labs: Review Flowsheet  More data exists      Latest Ref Rng & Units 12/12/2018 08/12/2019 02/20/2020 02/24/2022 04/04/2023  Labs for ITP Cardiac and Pulmonary Rehab  Cholestrol <200 mg/dL 864  892  - 883  103   LDL (calc) mg/dL (calc) 57  48  - 45  35   HDL-C > OR = 40 mg/dL 58  40  - 45  40   Trlycerides <150 mg/dL 885  896  - 812  787   Hemoglobin A1c <5.7 % of total Hgb 7.3  6.6  7.5  6.8  6.8     Capillary Blood Glucose: Lab Results  Component Value Date   GLUCAP 111 (H) 03/13/2018   GLUCAP 167 (H) 01/07/2017   GLUCAP 178 (H) 01/06/2017   GLUCAP 249 (H) 01/06/2017   GLUCAP 250 (H) 01/06/2017     Pulmonary Assessment Scores:  Pulmonary Assessment Scores     Row Name 07/04/23 1413         ADL UCSD   ADL Phase Entry     SOB Score total 25     Rest 0     Walk 1     Stairs 3     Bath 0     Dress 1     Shop 1        CAT Score   CAT Score 16       mMRC Score   mMRC Score 2             UCSD: Self-administered rating of dyspnea associated with activities of daily living (ADLs) 6-point scale (0 = not at all to 5 = maximal or unable to do because of breathlessness)  Scoring Scores range from 0 to 120.  Minimally important difference is 5 units  CAT: CAT can identify the health impairment of COPD patients and is better correlated with disease progression.  CAT has a scoring range of zero to 40. The CAT score is classified into four groups of low (less than 10), medium (10 - 20), high (21-30) and very high (31-40) based on the impact level of disease on health status. A CAT score over 10 suggests significant symptoms.  A worsening CAT score could be explained by an exacerbation, poor medication adherence, poor inhaler technique, or progression of COPD or comorbid conditions.  CAT MCID is 2 points  mMRC: mMRC (Modified Medical Research Council) Dyspnea Scale is used to assess the degree of baseline functional disability in patients of respiratory disease due to dyspnea. No minimal important difference is established. A decrease in score of 1 point or greater is considered a positive change.   Pulmonary Function Assessment:  Pulmonary Function Assessment - 07/04/23 1413       Breath   Shortness of Breath Yes;Limiting activity;Fear of Shortness of Breath             Exercise Target Goals: Exercise Program Goal: Individual exercise prescription set using results from initial 6 min walk test and THRR while considering  patient's activity barriers and safety.   Exercise Prescription Goal: Initial exercise prescription builds to 30-45 minutes a day of aerobic activity, 2-3 days per week.  Home exercise guidelines will be given to patient during program as part of exercise prescription that the participant will acknowledge.  Activity Barriers & Risk Stratification:  Activity Barriers &  Cardiac Risk Stratification - 07/04/23 0824       Activity Barriers & Cardiac Risk Stratification   Activity Barriers Joint Problems;Arthritis;Deconditioning;History of Falls;Muscular Weakness;Back Problems;Shortness of Breath   disc compression, chronic back pain uses horse linament, r leg numbness            6 Minute Walk:  6 Minute Walk  Row Name 07/04/23 1052         6 Minute Walk   Phase Initial     Distance 1140 feet     Walk Time 6 minutes     # of Rest Breaks 0     MPH 2.16     METS 2.14     RPE 11     Perceived Dyspnea  1     VO2 Peak 7.49     Symptoms Yes (comment)     Comments SOB     Resting HR 78 bpm     Resting BP 116/62     Resting Oxygen Saturation  96 %     Exercise Oxygen Saturation  during 6 min walk 95 %     Max Ex. HR 89 bpm     Max Ex. BP 126/64     2 Minute Post BP 118/62       Interval HR   1 Minute HR 86     2 Minute HR 88     3 Minute HR 87     4 Minute HR 88     5 Minute HR 79     6 Minute HR 89     2 Minute Post HR 81     Interval Heart Rate? Yes       Interval Oxygen   Interval Oxygen? Yes     Baseline Oxygen Saturation % 96 %     1 Minute Oxygen Saturation % 95 %     1 Minute Liters of Oxygen 0 L  Room Air     2 Minute Oxygen Saturation % 95 %     2 Minute Liters of Oxygen 0 L     3 Minute Oxygen Saturation % 95 %     3 Minute Liters of Oxygen 0 L     4 Minute Oxygen Saturation % 96 %     4 Minute Liters of Oxygen 0 L     5 Minute Oxygen Saturation % 95 %     5 Minute Liters of Oxygen 0 L     6 Minute Oxygen Saturation % 96 %     6 Minute Liters of Oxygen 0 L     2 Minute Post Oxygen Saturation % 96 %     2 Minute Post Liters of Oxygen 0 L              Oxygen Initial Assessment:  Oxygen Initial Assessment - 07/04/23 0827       Home Oxygen   Home Oxygen Device None    Sleep Oxygen Prescription None    Home Exercise Oxygen Prescription None    Home Resting Oxygen Prescription None      Initial 6 min Walk    Oxygen Used None      Program Oxygen Prescription   Program Oxygen Prescription None      Intervention   Short Term Goals To learn and understand importance of monitoring SPO2 with pulse oximeter and demonstrate accurate use of the pulse oximeter.;To learn and understand importance of maintaining oxygen saturations>88%;To learn and demonstrate proper pursed lip breathing techniques or other breathing techniques. ;To learn and demonstrate proper use of respiratory medications    Long  Term Goals Maintenance of O2 saturations>88%;Compliance with respiratory medication;Verbalizes importance of monitoring SPO2 with pulse oximeter and return demonstration;Exhibits proper breathing techniques, such as pursed lip breathing or other method taught during program session;Demonstrates proper use of MDI's  Oxygen Re-Evaluation:   Oxygen Discharge (Final Oxygen Re-Evaluation):   Initial Exercise Prescription:  Initial Exercise Prescription - 07/04/23 1100       Date of Initial Exercise RX and Referring Provider   Date 07/04/23    Referring Provider Alvan Carrier MD   Primary Pulmonologist: Dr. Theophilus     Oxygen   Maintain Oxygen Saturation 88% or higher      Treadmill   MPH 2    Grade 0.5    Minutes 15    METs 2.67      REL-XR   Level 2    Speed 50    Minutes 15    METs 2.5      Prescription Details   Frequency (times per week) 2    Duration Progress to 30 minutes of continuous aerobic without signs/symptoms of physical distress      Intensity   THRR 40-80% of Max Heartrate 104-130    Ratings of Perceived Exertion 11-13    Perceived Dyspnea 0-4      Progression   Progression Continue to progress workloads to maintain intensity without signs/symptoms of physical distress.      Resistance Training   Training Prescription Yes    Weight 4 lb    Reps 10-15             Perform Capillary Blood Glucose checks as needed.  Exercise Prescription  Changes:   Exercise Prescription Changes     Row Name 07/04/23 1100             Response to Exercise   Blood Pressure (Admit) 116/62       Blood Pressure (Exercise) 126/64       Blood Pressure (Exit) 112/66       Heart Rate (Admit) 78 bpm       Heart Rate (Exercise) 89 bpm       Heart Rate (Exit) 84 bpm       Oxygen Saturation (Admit) 96 %       Oxygen Saturation (Exercise) 95 %       Oxygen Saturation (Exit) 93 %       Rating of Perceived Exertion (Exercise) 11       Perceived Dyspnea (Exercise) 1       Symptoms SOb       Comments walk test results                Exercise Comments:   Exercise Goals and Review:   Exercise Goals     Row Name 07/04/23 1103             Exercise Goals   Increase Physical Activity Yes       Intervention Provide advice, education, support and counseling about physical activity/exercise needs.;Develop an individualized exercise prescription for aerobic and resistive training based on initial evaluation findings, risk stratification, comorbidities and participant's personal goals.       Expected Outcomes Short Term: Attend rehab on a regular basis to increase amount of physical activity.;Long Term: Add in home exercise to make exercise part of routine and to increase amount of physical activity.;Long Term: Exercising regularly at least 3-5 days a week.       Increase Strength and Stamina Yes       Intervention Provide advice, education, support and counseling about physical activity/exercise needs.;Develop an individualized exercise prescription for aerobic and resistive training based on initial evaluation findings, risk stratification, comorbidities and participant's personal goals.       Expected  Outcomes Short Term: Increase workloads from initial exercise prescription for resistance, speed, and METs.;Short Term: Perform resistance training exercises routinely during rehab and add in resistance training at home;Long Term: Improve  cardiorespiratory fitness, muscular endurance and strength as measured by increased METs and functional capacity ( )       Able to understand and use rate of perceived exertion (RPE) scale Yes       Intervention Provide education and explanation on how to use RPE scale       Expected Outcomes Short Term: Able to use RPE daily in rehab to express subjective intensity level;Long Term:  Able to use RPE to guide intensity level when exercising independently       Able to understand and use Dyspnea scale Yes       Intervention Provide education and explanation on how to use Dyspnea scale       Expected Outcomes Short Term: Able to use Dyspnea scale daily in rehab to express subjective sense of shortness of breath during exertion;Long Term: Able to use Dyspnea scale to guide intensity level when exercising independently       Knowledge and understanding of Target Heart Rate Range (THRR) Yes       Intervention Provide education and explanation of THRR including how the numbers were predicted and where they are located for reference       Expected Outcomes Short Term: Able to state/look up THRR;Short Term: Able to use daily as guideline for intensity in rehab;Long Term: Able to use THRR to govern intensity when exercising independently       Able to check pulse independently Yes       Intervention Provide education and demonstration on how to check pulse in carotid and radial arteries.;Review the importance of being able to check your own pulse for safety during independent exercise       Expected Outcomes Short Term: Able to explain why pulse checking is important during independent exercise;Long Term: Able to check pulse independently and accurately       Understanding of Exercise Prescription Yes       Intervention Provide education, explanation, and written materials on patient's individual exercise prescription       Expected Outcomes Short Term: Able to explain program exercise prescription;Long  Term: Able to explain home exercise prescription to exercise independently                Exercise Goals Re-Evaluation :   Discharge Exercise Prescription (Final Exercise Prescription Changes):  Exercise Prescription Changes - 07/04/23 1100       Response to Exercise   Blood Pressure (Admit) 116/62    Blood Pressure (Exercise) 126/64    Blood Pressure (Exit) 112/66    Heart Rate (Admit) 78 bpm    Heart Rate (Exercise) 89 bpm    Heart Rate (Exit) 84 bpm    Oxygen Saturation (Admit) 96 %    Oxygen Saturation (Exercise) 95 %    Oxygen Saturation (Exit) 93 %    Rating of Perceived Exertion (Exercise) 11    Perceived Dyspnea (Exercise) 1    Symptoms SOb    Comments walk test results             Nutrition:  Target Goals: Understanding of nutrition guidelines, daily intake of sodium 1500mg , cholesterol 200mg , calories 30% from fat and 7% or less from saturated fats, daily to have 5 or more servings of fruits and vegetables.  Biometrics:  Pre Biometrics - 07/04/23 1104  Pre Biometrics   Height 5' 8 (1.727 m)    Weight 81 kg    Waist Circumference 38.5 inches    Hip Circumference 38.5 inches    Waist to Hip Ratio 1 %    BMI (Calculated) 27.15    Grip Strength 26.8 kg    Single Leg Stand 30 seconds              Nutrition Therapy Plan and Nutrition Goals:  Nutrition Therapy & Goals - 07/04/23 1407       Intervention Plan   Intervention Prescribe, educate and counsel regarding individualized specific dietary modifications aiming towards targeted core components such as weight, hypertension, lipid management, diabetes, heart failure and other comorbidities.;Nutrition handout(s) given to patient.    Expected Outcomes Short Term Goal: Understand basic principles of dietary content, such as calories, fat, sodium, cholesterol and nutrients.;Long Term Goal: Adherence to prescribed nutrition plan.             Nutrition Assessments:  MEDIFICTS Score  Key: >=70 Need to make dietary changes  40-70 Heart Healthy Diet <= 40 Therapeutic Level Cholesterol Diet  Flowsheet Row PULMONARY REHAB OTHER RESP ORIENTATION from 07/04/2023 in [facility]  Picture Your Plate Total Score on Admission 56      Picture Your Plate Scores: <59 Unhealthy dietary pattern with much room for improvement. 41-50 Dietary pattern unlikely to meet recommendations for good health and room for improvement. 51-60 More healthful dietary pattern, with some room for improvement.  >60 Healthy dietary pattern, although there may be some specific behaviors that could be improved.    Nutrition Goals Re-Evaluation:   Nutrition Goals Discharge (Final Nutrition Goals Re-Evaluation):   Psychosocial: Target Goals: Acknowledge presence or absence of significant depression and/or stress, maximize coping skills, provide positive support system. Participant is able to verbalize types and ability to use techniques and skills needed for reducing stress and depression.  Initial Review & Psychosocial Screening:  Initial Psych Review & Screening - 07/04/23 9171       Initial Review   Current issues with Current Stress Concerns;Current Sleep Concerns    Source of Stress Concerns Chronic Illness;Unable to participate in former interests or hobbies;Unable to perform yard/household activities    Comments new puppy, had to put previous dog down after a stroke, history not sleeping well and up to bathroom (non alcholic beer helps too), breathing limits his activity any time he does anythign      Family Dynamics   Good Support System? Yes   wife (nurse), family lives in [place]     Barriers   Psychosocial barriers to participate in program The patient should benefit from training in stress management and relaxation.;Psychosocial barriers identified (see note)      Screening Interventions   Interventions Encouraged to exercise;Provide feedback about the scores  to participant;To provide support and resources with identified psychosocial needs    Expected Outcomes Short Term goal: Utilizing psychosocial counselor, staff and physician to assist with identification of specific Stressors or current issues interfering with healing process. Setting desired goal for each stressor or current issue identified.;Long Term Goal: Stressors or current issues are controlled or eliminated.;Short Term goal: Identification and review with participant of any Quality of Life or Depression concerns found by scoring the questionnaire.;Long Term goal: The participant improves quality of Life and PHQ9 Scores as seen by post scores and/or verbalization of changes             Quality of Life Scores:  Scores of 19 and below usually indicate a poorer quality of life in these areas.  A difference of  2-3 points is a clinically meaningful difference.  A difference of 2-3 points in the total score of the Quality of Life Index has been associated with significant improvement in overall quality of life, self-image, physical symptoms, and general health in studies assessing change in quality of life.   PHQ-9: Review Flowsheet  More data exists      07/04/2023 05/20/2022 03/25/2022 02/24/2022 05/20/2021  Depression screen PHQ 2/9  Decreased Interest 1 0 0 0 0  Down, Depressed, Hopeless 0 0 0 0 0  PHQ - 2 Score 1 0 0 0 0  Altered sleeping 1 - - - -  Tired, decreased energy 2 - - - -  Change in appetite 2 - - - -  Feeling bad or failure about yourself  0 - - - -  Trouble concentrating 1 - - - -  Moving slowly or fidgety/restless 0 - - - -  Suicidal thoughts 0 - - - -  PHQ-9 Score 7 - - - -   Interpretation of Total Score  Total Score Depression Severity:  1-4 = Minimal depression, 5-9 = Mild depression, 10-14 = Moderate depression, 15-19 = Moderately severe depression, 20-27 = Severe depression   Psychosocial Evaluation and Intervention:  Psychosocial Evaluation - 07/04/23  1106       Psychosocial Evaluation & Interventions   Interventions Relaxation education;Encouraged to exercise with the program and follow exercise prescription;Stress management education    Comments Ozell is coming into pulmonary rehab for ILD.  He also has heart history.  He has done the program previously for both cardiac and pulmonary and is looking forward to building up his stamina and being able to breathe better again.  He lives at home with his wife and they rest of their family is in [place]. [place] farm with horses, dogs, cats, chickens, and a donkey.  He does not precieve any barriers to attending rehab.  He is eager to get going and be able to not have to stop as much. He did lose his dog a few weeks ago and then his wife found a new German Sheperd puppy.  So they now have a new puppy to train and she has been scratching his arms up.    Expected Outcomes Short: Attend rehab to build stamina Long: Conitnue to enjoy puppy    Continue Psychosocial Services  Follow up required by staff             Psychosocial Re-Evaluation:   Psychosocial Discharge (Final Psychosocial Re-Evaluation):    Education: Education Goals: Education classes will be provided on a weekly basis, covering required topics. Participant will state understanding/return demonstration of topics presented.  Learning Barriers/Preferences:  Learning Barriers/Preferences - 07/04/23 9171       Learning Barriers/Preferences   Learning Barriers Sight;Hearing   glasses, hearing aids (stopped working)   Acupuncturist None             Education Topics: How Lungs Work and Diseases: - Discuss the anatomy of the lungs and diseases that can affect the lungs, such as COPD. Flowsheet Row PULMONARY REHAB OTHER RESPIRATORY from 12/29/2016 in [place] [facility]  Date 12/29/16  Educator GC  Instruction Review Code 2- meets goals/outcomes       Exercise: -Discuss the importance  of exercise, FITT principles of exercise, normal and abnormal responses to exercise, and how  to exercise safely. Flowsheet Row PULMONARY REHAB OTHER RESPIRATORY from 10/20/2016 in [place] [facility]  Date 10/06/16  Educator Cathlyn Cary  Instruction Review Code 2- meets goals/outcomes       Environmental Irritants: -Discuss types of environmental irritants and how to limit exposure to environmental irritants. Flowsheet Row PULMONARY REHAB OTHER RESPIRATORY from 10/20/2016 in [place] [facility]  Date 10/13/16  Educator GC  Instruction Review Code 2- meets goals/outcomes       Meds/Inhalers and oxygen: - Discuss respiratory medications, definition of an inhaler and oxygen, and the proper way to use an inhaler and oxygen. Flowsheet Row PULMONARY REHAB OTHER RESPIRATORY from 10/20/2016 in [place] [facility]  Date 10/20/16  Educator GC  Instruction Review Code 2- meets goals/outcomes       Energy Saving Techniques: - Discuss methods to conserve energy and decrease shortness of breath when performing activities of daily living.  Flowsheet Row PULMONARY REHAB OTHER RESPIRATORY from 12/29/2016 in [place] [facility]  Date 10/27/16  Educator Cathlyn Cary  Instruction Review Code 2- meets goals/outcomes       Bronchial Hygiene / Breathing Techniques: - Discuss breathing mechanics, pursed-lip breathing technique,  proper posture, effective ways to clear airways, and other functional breathing techniques   Cleaning Equipment: - Provides group verbal and written instruction about the health risks of elevated stress, cause of high stress, and healthy ways to reduce stress.   Nutrition I: Fats: - Discuss the types of cholesterol, what cholesterol does to the body, and how cholesterol levels can be controlled. Flowsheet Row CARDIAC REHAB PHASE II EXERCISE from 06/14/2017 in [place] [facility]  Date 05/24/17  Educator  DC  Instruction Review Code 2- Demonstrated Understanding       Nutrition II: Labels: -Discuss the different components of food labels and how to read food labels. Flowsheet Row CARDIAC REHAB PHASE II EXERCISE from 06/14/2017 in [place] [facility]  Date 05/31/17  Educator GC  Instruction Review Code 2- Demonstrated Understanding       Respiratory Infections: - Discuss the signs and symptoms of respiratory infections, ways to prevent respiratory infections, and the importance of seeking medical treatment when having a respiratory infection. Flowsheet Row PULMONARY REHAB OTHER RESPIRATORY from 12/29/2016 in [place] [facility]  Date 12/01/16  Educator GC  Instruction Review Code 2- meets goals/outcomes       Stress I: Signs and Symptoms: - Discuss the causes of stress, how stress may lead to anxiety and depression, and ways to limit stress. Flowsheet Row CARDIAC REHAB PHASE II EXERCISE from 06/14/2017 in [place] [facility]  Date 06/14/17  Educator DJ  Instruction Review Code 2- Demonstrated Understanding       Stress II: Relaxation: -Discuss relaxation techniques to limit stress. Flowsheet Row CARDIAC REHAB PHASE II EXERCISE from 06/14/2017 in [place] CARDIAC REHABILITATION  Date 03/15/17  Educator DJ  Instruction Review Code 2- Demonstrated Understanding       Oxygen for Home/Travel: - Discuss how to prepare for travel when on oxygen and proper ways to transport and store oxygen to ensure safety. Flowsheet Row PULMONARY REHAB OTHER RESPIRATORY from 10/20/2016 in [place] [facility]  Date 09/22/16  Educator GC  Instruction Review Code 2- meets goals/outcomes       Knowledge Questionnaire Score:  Knowledge Questionnaire Score - 07/04/23 1411       Knowledge Questionnaire Score   Pre Score 15/18  Core Components/Risk Factors/Patient Goals at Admission:  Personal Goals and Risk Factors  at Admission - 07/04/23 1411       Core Components/Risk Factors/Patient Goals on Admission    Weight Management Yes;Weight Loss    Intervention Weight Management: Develop a combined nutrition and exercise program designed to reach desired caloric intake, while maintaining appropriate intake of nutrient and fiber, sodium and fats, and appropriate energy expenditure required for the weight goal.;Weight Management: Provide education and appropriate resources to help participant work on and attain dietary goals.;Weight Management/Obesity: Establish reasonable short term and long term weight goals.    Admit Weight 178 lb 8 oz (81 kg)    Goal Weight: Short Term 173 lb (78.5 kg)    Goal Weight: Long Term 170 lb (77.1 kg)    Expected Outcomes Short Term: Continue to assess and modify interventions until short term weight is achieved;Long Term: Adherence to nutrition and physical activity/exercise program aimed toward attainment of established weight goal;Weight Loss: Understanding of general recommendations for a balanced deficit meal plan, which promotes 1-2 lb weight loss per week and includes a negative energy balance of [phone] kcal/d;Understanding recommendations for meals to include 15-35% energy as protein, 25-35% energy from fat, 35-60% energy from carbohydrates, less than 200mg  of dietary cholesterol, 20-35 gm of total fiber daily;Understanding of distribution of calorie intake throughout the day with the consumption of 4-5 meals/snacks    Improve shortness of breath with ADL's Yes    Intervention Provide education, individualized exercise plan and daily activity instruction to help decrease symptoms of SOB with activities of daily living.    Expected Outcomes Short Term: Improve cardiorespiratory fitness to achieve a reduction of symptoms when performing ADLs;Long Term: Be able to perform more ADLs without symptoms or delay the onset of symptoms    Increase knowledge of respiratory medications and  ability to use respiratory devices properly  Yes    Intervention Provide education and demonstration as needed of appropriate use of medications, inhalers, and oxygen therapy.    Expected Outcomes Short Term: Achieves understanding of medications use. Understands that oxygen is a medication prescribed by physician. Demonstrates appropriate use of inhaler and oxygen therapy.;Long Term: Maintain appropriate use of medications, inhalers, and oxygen therapy.    Diabetes Yes    Intervention Provide education about signs/symptoms and action to take for hypo/hyperglycemia.;Provide education about proper nutrition, including hydration, and aerobic/resistive exercise prescription along with prescribed medications to achieve blood glucose in normal ranges: Fasting glucose 65-99 mg/dL    Expected Outcomes Short Term: Participant verbalizes understanding of the signs/symptoms and immediate care of hyper/hypoglycemia, proper foot care and importance of medication, aerobic/resistive exercise and nutrition plan for blood glucose control.;Long Term: Attainment of HbA1C < 7%.    Heart Failure Yes    Intervention Provide a combined exercise and nutrition program that is supplemented with education, support and counseling about heart failure. Directed toward relieving symptoms such as shortness of breath, decreased exercise tolerance, and extremity edema.    Expected Outcomes Improve functional capacity of life;Short term: Attendance in program 2-3 days a week with increased exercise capacity. Reported lower sodium intake. Reported increased fruit and vegetable intake. Reports medication compliance.;Short term: Daily weights obtained and reported for increase. Utilizing diuretic protocols set by physician.;Long term: Adoption of self-care skills and reduction of barriers for early signs and symptoms recognition and intervention leading to self-care maintenance.    Lipids Yes    Intervention Provide education and support for  participant on nutrition & aerobic/resistive  exercise along with prescribed medications to achieve LDL 70mg , HDL >40mg .    Expected Outcomes Short Term: Participant states understanding of desired cholesterol values and is compliant with medications prescribed. Participant is following exercise prescription and nutrition guidelines.;Long Term: Cholesterol controlled with medications as prescribed, with individualized exercise RX and with personalized nutrition plan. Value goals: LDL < 70mg , HDL > 40 mg.             Core Components/Risk Factors/Patient Goals Review:    Core Components/Risk Factors/Patient Goals at Discharge (Final Review):    ITP Comments:  ITP Comments     Row Name 07/04/23 1051           ITP Comments Patient attend orientation today.  Patient is attending Pulmonary Rehabilitation Program.  Documentation for diagnosis can be found in 06/19/23 and 06/23/23.  Reviewed medical chart, RPE/RPD, gym safety, and program guidelines.  Patient was fitted to equipment they will be using during rehab.  Patient is scheduled to start exercise on Thursday 07/06/23 at 1030.   Initial ITP created and sent for review and signature by Dr. Anton Kelp, Medical Director for Pulmonary Rehabilitation Program.                Comments: Initial ITP

## 2023-07-04 NOTE — Patient Instructions (Addendum)
 Patient Instructions  Patient Details  Name: Caleb Butler MRN: [id number] Date of Birth: [birth year] Referring Provider:  Alvan Dorn FALCON, MD  Below are your personal goals for exercise, nutrition, and risk factors. Our goal is to help you stay on track towards obtaining and maintaining these goals. We will be discussing your progress on these goals with you throughout the program.  Initial Exercise Prescription:  Initial Exercise Prescription - 07/04/23 1100       Date of Initial Exercise RX and Referring Provider   Date 07/04/23    Referring Provider Alvan Dorn MD   Primary Pulmonologist: Dr. Theophilus     Oxygen   Maintain Oxygen Saturation 88% or higher      Treadmill   MPH 2    Grade 0.5    Minutes 15    METs 2.67      REL-XR   Level 2    Speed 50    Minutes 15    METs 2.5      Prescription Details   Frequency (times per week) 2    Duration Progress to 30 minutes of continuous aerobic without signs/symptoms of physical distress      Intensity   THRR 40-80% of Max Heartrate 104-130    Ratings of Perceived Exertion 11-13    Perceived Dyspnea 0-4      Progression   Progression Continue to progress workloads to maintain intensity without signs/symptoms of physical distress.      Resistance Training   Training Prescription Yes    Weight 4 lb    Reps 10-15             Exercise Goals: Frequency: Be able to perform aerobic exercise two to three times per week in program working toward 2-5 days per week of home exercise.  Intensity: Work with a perceived exertion of 11 (fairly light) - 15 (hard) while following your exercise prescription.  We will make changes to your prescription with you as you progress through the program.   Duration: Be able to do 30 to 45 minutes of continuous aerobic exercise in addition to a 5 minute warm-up and a 5 minute cool-down routine.   Nutrition Goals: Your personal nutrition goals will be established when you do  your nutrition analysis with the dietician.  The following are general nutrition guidelines to follow: Cholesterol < 200mg /day Sodium < 1500mg /day Fiber: Men over 50 yrs - 30 grams per day  Personal Goals:  Personal Goals and Risk Factors at Admission - 07/04/23 1411       Core Components/Risk Factors/Patient Goals on Admission    Weight Management Yes;Weight Loss    Intervention Weight Management: Develop a combined nutrition and exercise program designed to reach desired caloric intake, while maintaining appropriate intake of nutrient and fiber, sodium and fats, and appropriate energy expenditure required for the weight goal.;Weight Management: Provide education and appropriate resources to help participant work on and attain dietary goals.;Weight Management/Obesity: Establish reasonable short term and long term weight goals.    Admit Weight 178 lb 8 oz (81 kg)    Goal Weight: Short Term 173 lb (78.5 kg)    Goal Weight: Long Term 170 lb (77.1 kg)    Expected Outcomes Short Term: Continue to assess and modify interventions until short term weight is achieved;Long Term: Adherence to nutrition and physical activity/exercise program aimed toward attainment of established weight goal;Weight Loss: Understanding of general recommendations for a balanced deficit meal plan, which promotes 1-2 lb weight loss  per week and includes a negative energy balance of [phone] kcal/d;Understanding recommendations for meals to include 15-35% energy as protein, 25-35% energy from fat, 35-60% energy from carbohydrates, less than 200mg  of dietary cholesterol, 20-35 gm of total fiber daily;Understanding of distribution of calorie intake throughout the day with the consumption of 4-5 meals/snacks    Improve shortness of breath with ADL's Yes    Intervention Provide education, individualized exercise plan and daily activity instruction to help decrease symptoms of SOB with activities of daily living.    Expected Outcomes  Short Term: Improve cardiorespiratory fitness to achieve a reduction of symptoms when performing ADLs;Long Term: Be able to perform more ADLs without symptoms or delay the onset of symptoms    Increase knowledge of respiratory medications and ability to use respiratory devices properly  Yes    Intervention Provide education and demonstration as needed of appropriate use of medications, inhalers, and oxygen therapy.    Expected Outcomes Short Term: Achieves understanding of medications use. Understands that oxygen is a medication prescribed by physician. Demonstrates appropriate use of inhaler and oxygen therapy.;Long Term: Maintain appropriate use of medications, inhalers, and oxygen therapy.    Diabetes Yes    Intervention Provide education about signs/symptoms and action to take for hypo/hyperglycemia.;Provide education about proper nutrition, including hydration, and aerobic/resistive exercise prescription along with prescribed medications to achieve blood glucose in normal ranges: Fasting glucose 65-99 mg/dL    Expected Outcomes Short Term: Participant verbalizes understanding of the signs/symptoms and immediate care of hyper/hypoglycemia, proper foot care and importance of medication, aerobic/resistive exercise and nutrition plan for blood glucose control.;Long Term: Attainment of HbA1C < 7%.    Heart Failure Yes    Intervention Provide a combined exercise and nutrition program that is supplemented with education, support and counseling about heart failure. Directed toward relieving symptoms such as shortness of breath, decreased exercise tolerance, and extremity edema.    Expected Outcomes Improve functional capacity of life;Short term: Attendance in program 2-3 days a week with increased exercise capacity. Reported lower sodium intake. Reported increased fruit and vegetable intake. Reports medication compliance.;Short term: Daily weights obtained and reported for increase. Utilizing diuretic  protocols set by physician.;Long term: Adoption of self-care skills and reduction of barriers for early signs and symptoms recognition and intervention leading to self-care maintenance.    Lipids Yes    Intervention Provide education and support for participant on nutrition & aerobic/resistive exercise along with prescribed medications to achieve LDL 70mg , HDL >40mg .    Expected Outcomes Short Term: Participant states understanding of desired cholesterol values and is compliant with medications prescribed. Participant is following exercise prescription and nutrition guidelines.;Long Term: Cholesterol controlled with medications as prescribed, with individualized exercise RX and with personalized nutrition plan. Value goals: LDL < 70mg , HDL > 40 mg.             Tobacco Use Initial Evaluation: Social History   Tobacco Use  Smoking Status Never  Smokeless Tobacco Never    Exercise Goals and Review:  Exercise Goals     Row Name 07/04/23 1103             Exercise Goals   Increase Physical Activity Yes       Intervention Provide advice, education, support and counseling about physical activity/exercise needs.;Develop an individualized exercise prescription for aerobic and resistive training based on initial evaluation findings, risk stratification, comorbidities and participant's personal goals.       Expected Outcomes Short Term: Attend rehab on a regular  basis to increase amount of physical activity.;Long Term: Add in home exercise to make exercise part of routine and to increase amount of physical activity.;Long Term: Exercising regularly at least 3-5 days a week.       Increase Strength and Stamina Yes       Intervention Provide advice, education, support and counseling about physical activity/exercise needs.;Develop an individualized exercise prescription for aerobic and resistive training based on initial evaluation findings, risk stratification, comorbidities and participant's  personal goals.       Expected Outcomes Short Term: Increase workloads from initial exercise prescription for resistance, speed, and METs.;Short Term: Perform resistance training exercises routinely during rehab and add in resistance training at home;Long Term: Improve cardiorespiratory fitness, muscular endurance and strength as measured by increased METs and functional capacity ( )       Able to understand and use rate of perceived exertion (RPE) scale Yes       Intervention Provide education and explanation on how to use RPE scale       Expected Outcomes Short Term: Able to use RPE daily in rehab to express subjective intensity level;Long Term:  Able to use RPE to guide intensity level when exercising independently       Able to understand and use Dyspnea scale Yes       Intervention Provide education and explanation on how to use Dyspnea scale       Expected Outcomes Short Term: Able to use Dyspnea scale daily in rehab to express subjective sense of shortness of breath during exertion;Long Term: Able to use Dyspnea scale to guide intensity level when exercising independently       Knowledge and understanding of Target Heart Rate Range (THRR) Yes       Intervention Provide education and explanation of THRR including how the numbers were predicted and where they are located for reference       Expected Outcomes Short Term: Able to state/look up THRR;Short Term: Able to use daily as guideline for intensity in rehab;Long Term: Able to use THRR to govern intensity when exercising independently       Able to check pulse independently Yes       Intervention Provide education and demonstration on how to check pulse in carotid and radial arteries.;Review the importance of being able to check your own pulse for safety during independent exercise       Expected Outcomes Short Term: Able to explain why pulse checking is important during independent exercise;Long Term: Able to check pulse independently and  accurately       Understanding of Exercise Prescription Yes       Intervention Provide education, explanation, and written materials on patient's individual exercise prescription       Expected Outcomes Short Term: Able to explain program exercise prescription;Long Term: Able to explain home exercise prescription to exercise independently              Copy of goals given to participant.

## 2023-07-05 ENCOUNTER — Encounter (HOSPITAL_COMMUNITY): Payer: No Typology Code available for payment source

## 2023-07-06 ENCOUNTER — Encounter (HOSPITAL_COMMUNITY)
Admission: RE | Admit: 2023-07-06 | Discharge: 2023-07-06 | Disposition: A | Discharge status: Home / Self Care | Payer: No Typology Code available for payment source | Source: Ambulatory Visit | Attending: Cardiology | Admitting: Cardiology

## 2023-07-06 DIAGNOSIS — J849 Interstitial pulmonary disease, unspecified: Secondary | ICD-10-CM

## 2023-07-06 LAB — GLUCOSE, CAPILLARY: Glucose-Capillary: 225 mg/dL — ABNORMAL HIGH (ref 70–99)

## 2023-07-06 NOTE — Progress Notes (Signed)
 Daily Session Note  Patient Details  Name: Caleb Butler MRN: [id number] Date of Birth: 1945-07-26 Referring Provider:   Flowsheet Row PULMONARY REHAB OTHER RESP ORIENTATION from 07/04/2023 in [facility] CARDIAC REHABILITATION  Referring Provider Alvan Carrier MD  [facility] Pulmonologist: Dr. Brown       Encounter Date: 07/06/2023  Check In:  Session Check In - 07/06/23 1015       Check-In   Supervising physician immediately available to respond to emergencies See telemetry face sheet for immediately available ER MD    Location [facility]    Staff Present Powell Benders, BS, Exercise Physiologist;Emad Brechtel Vicci, RN, BSN    Virtual Visit No    Medication changes reported     No    Fall or balance concerns reported    No    Warm-up and Cool-down Performed on first and last piece of equipment    Resistance Training Performed Yes    VAD Patient? No    PAD/SET Patient? No      Pain Assessment   Currently in Pain? No/denies    Multiple Pain Sites No             Capillary Blood Glucose: Results for orders placed or performed during the hospital encounter of 07/06/23 (from the past 24 hours)  Glucose, capillary     Status: Abnormal   Collection Time: 07/06/23 10:26 AM  Result Value Ref Range   Glucose-Capillary 225 (H) 70 - 99 mg/dL      Social History   Tobacco Use  Smoking Status Never  Smokeless Tobacco Never    Goals Met:  Proper associated with RPD/PD & O2 Sat Independence with exercise equipment Using PLB without cueing & demonstrates good technique Exercise tolerated well No report of concerns or symptoms today Strength training completed today  Goals Unmet:  Not Applicable  Comments: Pt able to follow exercise prescription today without complaint.  Will continue to monitor for progression.

## 2023-07-09 ENCOUNTER — Other Ambulatory Visit: Payer: Self-pay | Admitting: Family Medicine

## 2023-07-09 DIAGNOSIS — I25119 Atherosclerotic heart disease of native coronary artery with unspecified angina pectoris: Secondary | ICD-10-CM

## 2023-07-11 ENCOUNTER — Encounter (HOSPITAL_COMMUNITY)
Admission: RE | Admit: 2023-07-11 | Discharge: 2023-07-11 | Disposition: A | Discharge status: Home / Self Care | Payer: No Typology Code available for payment source | Source: Ambulatory Visit | Attending: Cardiology

## 2023-07-11 DIAGNOSIS — J849 Interstitial pulmonary disease, unspecified: Secondary | ICD-10-CM

## 2023-07-11 NOTE — Progress Notes (Signed)
Daily Session Note  Patient Details  Name: Shoichi Mielke MRN: [id number] Date of Birth: Jun 14, 1945 Referring Provider:   Flowsheet Row PULMONARY REHAB OTHER RESP ORIENTATION from 07/04/2023 in [facility] CARDIAC REHABILITATION  Referring Provider Dina Rich MD  [facility] Pulmonologist: Dr. Isaiah Serge       Encounter Date: 07/11/2023  Check In:  Session Check In - 07/11/23 1051       Check-In   Supervising physician immediately available to respond to emergencies See telemetry face sheet for immediately available MD    Location [facility]    Staff Present Ross Ludwig, BS, Exercise Physiologist;Danny Gala Romney, RN, BSN    Virtual Visit No    Medication changes reported     No    Fall or balance concerns reported    No    Warm-up and Cool-down Performed on first and last piece of equipment    Resistance Training Performed Yes    VAD Patient? No    PAD/SET Patient? No      Pain Assessment   Currently in Pain? No/denies             Capillary Blood Glucose: No results found for this or any previous visit (from the past 24 hours).    Social History   Tobacco Use  Smoking Status Never  Smokeless Tobacco Never    Goals Met:  Proper associated with RPD/PD & O2 Sat Independence with exercise equipment Using PLB without cueing & demonstrates good technique Exercise tolerated well No report of concerns or symptoms today Strength training completed today  Goals Unmet:  Not Applicable  Comments: Pt able to follow exercise prescription today without complaint.  Will continue to monitor for progression.

## 2023-07-13 ENCOUNTER — Encounter (HOSPITAL_COMMUNITY)
Admission: RE | Admit: 2023-07-13 | Discharge: 2023-07-13 | Disposition: A | Discharge status: Home / Self Care | Payer: No Typology Code available for payment source | Source: Ambulatory Visit | Attending: Cardiology

## 2023-07-13 DIAGNOSIS — J849 Interstitial pulmonary disease, unspecified: Secondary | ICD-10-CM | POA: Diagnosis not present

## 2023-07-13 LAB — GLUCOSE, CAPILLARY: Glucose-Capillary: 171 mg/dL — ABNORMAL HIGH (ref 70–99)

## 2023-07-13 NOTE — Progress Notes (Signed)
Daily Session Note  Patient Details  Name: Quentez Lober MRN: [id number] Date of Birth: 1945/11/03 Referring Provider:   Flowsheet Row PULMONARY REHAB OTHER RESP ORIENTATION from 07/04/2023 in [facility] CARDIAC REHABILITATION  Referring Provider Dina Rich MD  [facility] Pulmonologist: Dr. Isaiah Serge       Encounter Date: 07/13/2023  Check In:  Session Check In - 07/13/23 1030       Check-In   Supervising physician immediately available to respond to emergencies See telemetry face sheet for immediately available MD    Location [facility]    Staff Present Ross Ludwig, BS, Exercise Physiologist;Brittany Foley, BSN, RN;Hillary Troutman BSN, RN    Virtual Visit No    Medication changes reported     No    Fall or balance concerns reported    No    Tobacco Cessation No Change    Warm-up and Cool-down Performed on first and last piece of equipment    Resistance Training Performed Yes    VAD Patient? No    PAD/SET Patient? No      Pain Assessment   Currently in Pain? No/denies    Multiple Pain Sites No             Capillary Blood Glucose: Results for orders placed or performed during the hospital encounter of 07/13/23 (from the past 24 hours)  Glucose, capillary     Status: Abnormal   Collection Time: 07/13/23 10:25 AM  Result Value Ref Range   Glucose-Capillary 171 (H) 70 - 99 mg/dL      Social History   Tobacco Use  Smoking Status Never  Smokeless Tobacco Never    Goals Met:  Independence with exercise equipment Using PLB without cueing & demonstrates good technique Exercise tolerated well No report of concerns or symptoms today Strength training completed today  Goals Unmet:  Not Applicable  Comments: Pt able to follow exercise prescription today without complaint.  Will continue to monitor for progression.

## 2023-07-18 ENCOUNTER — Encounter (HOSPITAL_COMMUNITY)
Admission: RE | Admit: 2023-07-18 | Discharge: 2023-07-18 | Disposition: A | Discharge status: Home / Self Care | Payer: No Typology Code available for payment source | Source: Ambulatory Visit | Attending: Cardiology | Admitting: Cardiology

## 2023-07-18 DIAGNOSIS — R0602 Shortness of breath: Secondary | ICD-10-CM

## 2023-07-18 DIAGNOSIS — J849 Interstitial pulmonary disease, unspecified: Secondary | ICD-10-CM | POA: Diagnosis not present

## 2023-07-18 NOTE — Progress Notes (Signed)
Daily Session Note  Patient Details  Name: Caleb Butler MRN: [id number] Date of Birth: February 21, 1946 Referring Provider:   Flowsheet Row PULMONARY REHAB OTHER RESP ORIENTATION from 07/04/2023 in [facility] CARDIAC REHABILITATION  Referring Provider Dina Rich MD  [facility] Pulmonologist: Dr. Isaiah Serge       Encounter Date: 07/18/2023  Check In:  Session Check In - 07/18/23 1030       Check-In   Supervising physician immediately available to respond to emergencies See telemetry face sheet for immediately available MD    Location [facility]    Staff Present Ross Ludwig, BS, Exercise Physiologist;Brittany Foley, BSN, RN;Jessica Hawkins, MA, RCEP, CCRP, CCET    Virtual Visit No    Medication changes reported     No    Fall or balance concerns reported    No    Tobacco Cessation No Change    Warm-up and Cool-down Performed on first and last piece of equipment    Resistance Training Performed Yes    VAD Patient? No    PAD/SET Patient? No      Pain Assessment   Currently in Pain? No/denies    Multiple Pain Sites No             Capillary Blood Glucose: No results found for this or any previous visit (from the past 24 hours).    Social History   Tobacco Use  Smoking Status Never  Smokeless Tobacco Never    Goals Met:  Independence with exercise equipment Using PLB without cueing & demonstrates good technique Exercise tolerated well No report of concerns or symptoms today Strength training completed today  Goals Unmet:  Not Applicable  Comments: Pt able to follow exercise prescription today without complaint.  Will continue to monitor for progression.

## 2023-07-20 ENCOUNTER — Encounter (HOSPITAL_COMMUNITY)
Admission: RE | Admit: 2023-07-20 | Discharge: 2023-07-20 | Disposition: A | Discharge status: Home / Self Care | Payer: No Typology Code available for payment source | Source: Ambulatory Visit | Attending: Cardiology | Admitting: Cardiology

## 2023-07-20 DIAGNOSIS — J849 Interstitial pulmonary disease, unspecified: Secondary | ICD-10-CM | POA: Diagnosis not present

## 2023-07-20 DIAGNOSIS — R0602 Shortness of breath: Secondary | ICD-10-CM

## 2023-07-20 NOTE — Progress Notes (Signed)
Daily Session Note  Patient Details  Name: Caleb Butler MRN: [id number] Date of Birth: December 09, 1945 Referring Provider:   Flowsheet Row PULMONARY REHAB OTHER RESP ORIENTATION from 07/04/2023 in [facility] CARDIAC REHABILITATION  Referring Provider Dina Rich MD  [facility] Pulmonologist: Dr. Isaiah Serge       Encounter Date: 07/20/2023  Check In:  Session Check In - 07/20/23 1044       Check-In   Supervising physician immediately available to respond to emergencies See telemetry face sheet for immediately available MD    Location [facility]    Staff Present Ross Ludwig, BS, Exercise Physiologist;Debra Laural Benes, RN, Thomos Lemons, MA, RCEP, CCRP, CCET    Virtual Visit No    Medication changes reported     No    Fall or balance concerns reported    No    Warm-up and Cool-down Performed on first and last piece of equipment    Resistance Training Performed Yes    VAD Patient? No    PAD/SET Patient? No      Pain Assessment   Currently in Pain? No/denies             Capillary Blood Glucose: No results found for this or any previous visit (from the past 24 hours).    Social History   Tobacco Use  Smoking Status Never  Smokeless Tobacco Never    Goals Met:  Proper associated with RPD/PD & O2 Sat Independence with exercise equipment Using PLB without cueing & demonstrates good technique Exercise tolerated well No report of concerns or symptoms today Strength training completed today  Goals Unmet:  Not Applicable  Comments: Pt able to follow exercise prescription today without complaint.  Will continue to monitor for progression.

## 2023-07-21 ENCOUNTER — Other Ambulatory Visit: Payer: Self-pay | Admitting: Family Medicine

## 2023-07-21 MED ORDER — TRAZODONE HCL 50 MG PO TABS: 50.0000 mg | ORAL_TABLET | Freq: Every day | ORAL | 3 refills | Status: AC

## 2023-07-25 ENCOUNTER — Encounter (HOSPITAL_COMMUNITY)
Admission: RE | Admit: 2023-07-25 | Discharge: 2023-07-25 | Disposition: A | Discharge status: Home / Self Care | Payer: No Typology Code available for payment source | Source: Ambulatory Visit | Attending: Cardiology

## 2023-07-25 DIAGNOSIS — J849 Interstitial pulmonary disease, unspecified: Secondary | ICD-10-CM

## 2023-07-25 DIAGNOSIS — R0602 Shortness of breath: Secondary | ICD-10-CM

## 2023-07-25 NOTE — Progress Notes (Signed)
 Daily Session Note  Patient Details  Name: Ezekiel Menzer MRN: [id number] Date of Birth: November 09, 1945 Referring Provider:   Flowsheet Row PULMONARY REHAB OTHER RESP ORIENTATION from 07/04/2023 in [facility] CARDIAC REHABILITATION  Referring Provider Dina Rich MD  [facility] Pulmonologist: Dr. Isaiah Serge       Encounter Date: 07/25/2023  Check In:  Session Check In - 07/25/23 1037       Check-In   Supervising physician immediately available to respond to emergencies See telemetry face sheet for immediately available MD    Location [facility]    Staff Present Ross Ludwig, BS, Exercise Physiologist;Brittany Roseanne Reno, BSN, RN, WTA-C;Phyllis Billingsley, RN;Alesha Jaffee Hartshorne, MA, RCEP, CCRP, CCET    Virtual Visit No    Medication changes reported     No    Fall or balance concerns reported    No    Warm-up and Cool-down Performed on first and last piece of equipment    Resistance Training Performed Yes    VAD Patient? No    PAD/SET Patient? No      Pain Assessment   Currently in Pain? No/denies             Capillary Blood Glucose: No results found for this or any previous visit (from the past 24 hours).    Social History   Tobacco Use  Smoking Status Never  Smokeless Tobacco Never    Goals Met:  Proper associated with RPD/PD & O2 Sat Independence with exercise equipment Using PLB without cueing & demonstrates good technique Exercise tolerated well No report of concerns or symptoms today Strength training completed today  Goals Unmet:  Not Applicable  Comments: Pt able to follow exercise prescription today without complaint.  Will continue to monitor for progression.

## 2023-07-26 ENCOUNTER — Encounter (HOSPITAL_COMMUNITY): Payer: Self-pay | Admitting: *Deleted

## 2023-07-26 DIAGNOSIS — R0602 Shortness of breath: Secondary | ICD-10-CM

## 2023-07-26 DIAGNOSIS — J849 Interstitial pulmonary disease, unspecified: Secondary | ICD-10-CM

## 2023-07-26 NOTE — Progress Notes (Signed)
 Pulmonary Individual Treatment Plan  Patient Details  Name: Caleb Butler MRN: [id number] Date of Birth: May 06, 1946 Referring Provider:   Flowsheet Row PULMONARY REHAB OTHER RESP ORIENTATION from 07/04/2023 in [facility] CARDIAC REHABILITATION  Referring Provider Dina Rich MD  [Primary Pulmonologist: Dr. Isaiah Serge       Initial Encounter Date:  Flowsheet Row PULMONARY REHAB OTHER RESP ORIENTATION from 07/04/2023 in [facility] RE[facility]  Date 07/04/23       Visit Diagnosis: ILD (interstitial lung disease) (HCC)  SOB (shortness of breath)  Patient's Home Medications on Admission:   Current Outpatient Medications:    traZODone (DESYREL) 50 MG tablet, Take 1 tablet (50 mg total) by mouth at bedtime., Disp: 90 tablet, Rfl: 3   Accu-Chek FastClix Lancets MISC, USE TO CHECK BLOOD SUGAR TWICE DAILY E11.9, Disp: 102 each, Rfl: 12   acetaminophen (TYLENOL) 500 MG tablet, Take 500-1,000 mg by mouth daily as needed for moderate pain or headache., Disp: , Rfl:    aspirin 81 MG EC tablet, Take 81 mg by mouth at bedtime. , Disp: , Rfl:    blood glucose meter kit and supplies, Dispense based on patient and insurance preference. Use up to four times daily as directed. (FOR ICD-10 E10.9, E11.9)., Disp: 1 each, Rfl: 0   Blood Glucose Monitoring Suppl DEVI, 1 each by Does not apply route in the morning, at noon, and at bedtime. May substitute to any manufacturer covered by patient's insurance. Needs Onetouch by [employer], Disp: 1 each, Rfl: 0   Capsaicin 0.1 % CREA, Apply topically as needed., Disp: , Rfl:    clopidogrel (PLAVIX) 75 MG tablet, TAKE 1 TABLET BY MOUTH EVERY DAY, Disp: 90 tablet, Rfl: 0   levothyroxine (SYNTHROID) 75 MCG tablet, Take 1 tablet (75 mcg total) by mouth daily., Disp: 90 tablet, Rfl: 3   mometasone (ELOCON) 0.1 % ointment, Apply topically daily., Disp: 45 g, Rfl: 0   Multiple Vitamin (MULTIVITAMIN WITH MINERALS) TABS tablet, Take 1 tablet by mouth  daily., Disp: , Rfl:    rosuvastatin (CRESTOR) 20 MG tablet, TAKE 1 TABLET BY MOUTH EVERY DAY, Disp: 90 tablet, Rfl: 3   Semaglutide,0.25 or 0.5MG /DOS, (OZEMPIC, 0.25 OR 0.5 MG/DOSE,) 2 MG/1.5ML SOPN, Inject 0.5 mg into the skin once a week., Disp: 1.5 mL, Rfl: 3   Tiotropium Bromide-Olodaterol (STIOLTO RESPIMAT) 2.5-2.5 MCG/ACT AERS, Inhale 2 puffs into the lungs daily., Disp: 1 each, Rfl: 5   vitamin B-12 (CYANOCOBALAMIN) 1000 MCG tablet, Take 1,000 mcg by mouth daily., Disp: , Rfl:   Past Medical History: Past Medical History:  Diagnosis Date   Anxiety    Aortic aneurysm (HCC)    dilated aortic root on echo 2024   Arthritis    "my back is loaded w/it" (01/06/2017)   CAD (coronary artery disease)    80% ostial lesion in L circumflex (2017)   Chronic lower back pain    GERD (gastroesophageal reflux disease)    Hiatal hernia    High cholesterol    Hypothyroidism    IBS (irritable bowel syndrome)    Numbness and tingling of right lower extremity    "since 11/30/2016" (01/06/2017)   PMR (polymyalgia rheumatica) (HCC)    Stroke (HCC)    Type 2 diabetes mellitus (HCC)     Tobacco Use: Social History   Tobacco Use  Smoking Status Never  Smokeless Tobacco Never    Labs: Review Flowsheet  More data exists      Latest Ref Rng & Units 12/12/2018  08/12/2019 02/20/2020 02/24/2022 04/04/2023  Labs for ITP Cardiac and Pulmonary Rehab  Cholestrol <200 mg/dL 782  956  - 213  086   LDL (calc) mg/dL (calc) 57  48  - 45  35   HDL-C > OR = 40 mg/dL 58  40  - 45  40   Trlycerides <150 mg/dL 578  469  - 629  528   Hemoglobin A1c <5.7 % of total Hgb 7.3  6.6  7.5  6.8  6.8     Capillary Blood Glucose: Lab Results  Component Value Date   GLUCAP 171 (H) 07/13/2023   GLUCAP 225 (H) 07/06/2023   GLUCAP 111 (H) 03/13/2018   GLUCAP 167 (H) 01/07/2017   GLUCAP 178 (H) 01/06/2017     Pulmonary Assessment Scores:  Pulmonary Assessment Scores     Row Name 07/04/23 1413         ADL UCSD    ADL Phase Entry     SOB Score total 25     Rest 0     Walk 1     Stairs 3     Bath 0     Dress 1     Shop 1       CAT Score   CAT Score 16       mMRC Score   mMRC Score 2             UCSD: Self-administered rating of dyspnea associated with activities of daily living (ADLs) 6-point scale (0 = "not at all" to 5 = "maximal or unable to do because of breathlessness")  Scoring Scores range from 0 to 120.  Minimally important difference is 5 units  CAT: CAT can identify the health impairment of COPD patients and is better correlated with disease progression.  CAT has a scoring range of zero to 40. The CAT score is classified into four groups of low (less than 10), medium (10 - 20), high (21-30) and very high (31-40) based on the impact level of disease on health status. A CAT score over 10 suggests significant symptoms.  A worsening CAT score could be explained by an exacerbation, poor medication adherence, poor inhaler technique, or progression of COPD or comorbid conditions.  CAT MCID is 2 points  mMRC: mMRC (Modified Medical Research Council) Dyspnea Scale is used to assess the degree of baseline functional disability in patients of respiratory disease due to dyspnea. No minimal important difference is established. A decrease in score of 1 point or greater is considered a positive change.   Pulmonary Function Assessment:  Pulmonary Function Assessment - 07/04/23 1413       Breath   Shortness of Breath Yes;Limiting activity;Fear of Shortness of Breath             Exercise Target Goals: Exercise Program Goal: Individual exercise prescription set using results from initial 6 min walk test and THRR while considering  patient's activity barriers and safety.   Exercise Prescription Goal: Initial exercise prescription builds to 30-45 minutes a day of aerobic activity, 2-3 days per week.  Home exercise guidelines will be given to patient during program as part of exercise  prescription that the participant will acknowledge.  Activity Barriers & Risk Stratification:  Activity Barriers & Cardiac Risk Stratification - 07/04/23 0824       Activity Barriers & Cardiac Risk Stratification   Activity Barriers Joint Problems;Arthritis;Deconditioning;History of Falls;Muscular Weakness;Back Problems;Shortness of Breath   disc compression, chronic back pain uses horse linament, r  leg numbness            6 Minute Walk:  6 Minute Walk     Row Name 07/04/23 1052         6 Minute Walk   Phase Initial     Distance 1140 feet     Walk Time 6 minutes     # of Rest Breaks 0     MPH 2.16     METS 2.14     RPE 11     Perceived Dyspnea  1     VO2 Peak 7.49     Symptoms Yes (comment)     Comments SOB     Resting HR 78 bpm     Resting BP 116/62     Resting Oxygen Saturation  96 %     Exercise Oxygen Saturation  during 6 min walk 95 %     Max Ex. HR 89 bpm     Max Ex. BP 126/64     2 Minute Post BP 118/62       Interval HR   1 Minute HR 86     2 Minute HR 88     3 Minute HR 87     4 Minute HR 88     5 Minute HR 79     6 Minute HR 89     2 Minute Post HR 81     Interval Heart Rate? Yes       Interval Oxygen   Interval Oxygen? Yes     Baseline Oxygen Saturation % 96 %     1 Minute Oxygen Saturation % 95 %     1 Minute Liters of Oxygen 0 L  Room Air     2 Minute Oxygen Saturation % 95 %     2 Minute Liters of Oxygen 0 L     3 Minute Oxygen Saturation % 95 %     3 Minute Liters of Oxygen 0 L     4 Minute Oxygen Saturation % 96 %     4 Minute Liters of Oxygen 0 L     5 Minute Oxygen Saturation % 95 %     5 Minute Liters of Oxygen 0 L     6 Minute Oxygen Saturation % 96 %     6 Minute Liters of Oxygen 0 L     2 Minute Post Oxygen Saturation % 96 %     2 Minute Post Liters of Oxygen 0 L              Oxygen Initial Assessment:  Oxygen Initial Assessment - 07/04/23 0827       Home Oxygen   Home Oxygen Device None    Sleep Oxygen  Prescription None    Home Exercise Oxygen Prescription None    Home Resting Oxygen Prescription None      Initial 6 min Walk   Oxygen Used None      Program Oxygen Prescription   Program Oxygen Prescription None      Intervention   Short Term Goals To learn and understand importance of monitoring SPO2 with pulse oximeter and demonstrate accurate use of the pulse oximeter.;To learn and understand importance of maintaining oxygen saturations>88%;To learn and demonstrate proper pursed lip breathing techniques or other breathing techniques. ;To learn and demonstrate proper use of respiratory medications    Long  Term Goals Maintenance of O2 saturations>88%;Compliance with respiratory medication;Verbalizes importance of monitoring SPO2 with pulse oximeter  and return demonstration;Exhibits proper breathing techniques, such as pursed lip breathing or other method taught during program session;Demonstrates proper use of MDI's             Oxygen Re-Evaluation:   Oxygen Discharge (Final Oxygen Re-Evaluation):   Initial Exercise Prescription:  Initial Exercise Prescription - 07/04/23 1100       Date of Initial Exercise RX and Referring Provider   Date 07/04/23    Referring Provider Dina Rich MD   Primary Pulmonologist: Dr. Isaiah Serge     Oxygen   Maintain Oxygen Saturation 88% or higher      Treadmill   MPH 2    Grade 0.5    Minutes 15    METs 2.67      REL-XR   Level 2    Speed 50    Minutes 15    METs 2.5      Prescription Details   Frequency (times per week) 2    Duration Progress to 30 minutes of continuous aerobic without signs/symptoms of physical distress      Intensity   THRR 40-80% of Max Heartrate 104-130    Ratings of Perceived Exertion 11-13    Perceived Dyspnea 0-4      Progression   Progression Continue to progress workloads to maintain intensity without signs/symptoms of physical distress.      Resistance Training   Training Prescription Yes     Weight 4 lb    Reps 10-15             Perform Capillary Blood Glucose checks as needed.  Exercise Prescription Changes:   Exercise Prescription Changes     Row Name 07/04/23 1100 07/11/23 1200 07/20/23 1200         Response to Exercise   Blood Pressure (Admit) 116/62 118/60 110/60     Blood Pressure (Exercise) 126/64 118/62 112/62     Blood Pressure (Exit) 112/66 108/68 106/60     Heart Rate (Admit) 78 bpm 78 bpm 63 bpm     Heart Rate (Exercise) 89 bpm 103 bpm 86 bpm     Heart Rate (Exit) 84 bpm 85 bpm 76 bpm     Oxygen Saturation (Admit) 96 % 96 % 99 %     Oxygen Saturation (Exercise) 95 % 93 % 96 %     Oxygen Saturation (Exit) 93 % 97 % 96 %     Rating of Perceived Exertion (Exercise) 11 15 12      Perceived Dyspnea (Exercise) 1 3 3      Symptoms SOb -- --     Comments walk test results -- --     Duration -- Continue with 30 min of aerobic exercise without signs/symptoms of physical distress. Continue with 30 min of aerobic exercise without signs/symptoms of physical distress.     Intensity -- THRR unchanged THRR unchanged       Progression   Progression -- Continue to progress workloads to maintain intensity without signs/symptoms of physical distress. Continue to progress workloads to maintain intensity without signs/symptoms of physical distress.       Resistance Training   Training Prescription -- Yes Yes     Weight -- 4 4     Reps -- 10-15 10-15       Treadmill   MPH -- 2.5 2.5     Grade -- 0.5 1.5     Minutes -- 15 15     METs -- 3.09 3.43       REL-XR  Level -- 2 2     Speed -- 83 61     Minutes -- 15 15     METs -- 4.7 4.6       Oxygen   Maintain Oxygen Saturation -- -- 88% or higher              Exercise Comments:   Exercise Goals and Review:   Exercise Goals     Row Name 07/04/23 1103             Exercise Goals   Increase Physical Activity Yes       Intervention Provide advice, education, support and counseling about physical  activity/exercise needs.;Develop an individualized exercise prescription for aerobic and resistive training based on initial evaluation findings, risk stratification, comorbidities and participant's personal goals.       Expected Outcomes Short Term: Attend rehab on a regular basis to increase amount of physical activity.;Long Term: Add in home exercise to make exercise part of routine and to increase amount of physical activity.;Long Term: Exercising regularly at least 3-5 days a week.       Increase Strength and Stamina Yes       Intervention Provide advice, education, support and counseling about physical activity/exercise needs.;Develop an individualized exercise prescription for aerobic and resistive training based on initial evaluation findings, risk stratification, comorbidities and participant's personal goals.       Expected Outcomes Short Term: Increase workloads from initial exercise prescription for resistance, speed, and METs.;Short Term: Perform resistance training exercises routinely during rehab and add in resistance training at home;Long Term: Improve cardiorespiratory fitness, muscular endurance and strength as measured by increased METs and functional capacity ( )       Able to understand and use rate of perceived exertion (RPE) scale Yes       Intervention Provide education and explanation on how to use RPE scale       Expected Outcomes Short Term: Able to use RPE daily in rehab to express subjective intensity level;Long Term:  Able to use RPE to guide intensity level when exercising independently       Able to understand and use Dyspnea scale Yes       Intervention Provide education and explanation on how to use Dyspnea scale       Expected Outcomes Short Term: Able to use Dyspnea scale daily in rehab to express subjective sense of shortness of breath during exertion;Long Term: Able to use Dyspnea scale to guide intensity level when exercising independently       Knowledge and  understanding of Target Heart Rate Range (THRR) Yes       Intervention Provide education and explanation of THRR including how the numbers were predicted and where they are located for reference       Expected Outcomes Short Term: Able to state/look up THRR;Short Term: Able to use daily as guideline for intensity in rehab;Long Term: Able to use THRR to govern intensity when exercising independently       Able to check pulse independently Yes       Intervention Provide education and demonstration on how to check pulse in carotid and radial arteries.;Review the importance of being able to check your own pulse for safety during independent exercise       Expected Outcomes Short Term: Able to explain why pulse checking is important during independent exercise;Long Term: Able to check pulse independently and accurately       Understanding of Exercise Prescription Yes  Intervention Provide education, explanation, and written materials on patient's individual exercise prescription       Expected Outcomes Short Term: Able to explain program exercise prescription;Long Term: Able to explain home exercise prescription to exercise independently                Exercise Goals Re-Evaluation :  Exercise Goals Re-Evaluation     Row Name 07/11/23 1251 07/24/23 0823           Exercise Goal Re-Evaluation   Exercise Goals Review Increase Physical Activity;Increase Strength and Stamina;Understanding of Exercise Prescription Increase Physical Activity;Increase Strength and Stamina;Understanding of Exercise Prescription      Comments Caleb Butler is doing well in rehab and is tolerating exercise well. He is SOB when he gets off the treadmill. He is only on his 3rd visit. Will continue to montior and progress as able. Caleb Butler is doing well in rehab and is tolerating exercise well. He is SOB when he gets off the treadmill. Will continue to montior and progress as able.      Expected Outcomes Continue to attend  rehab Continue to attend rehab               Discharge Exercise Prescription (Final Exercise Prescription Changes):  Exercise Prescription Changes - 07/20/23 1200       Response to Exercise   Blood Pressure (Admit) 110/60    Blood Pressure (Exercise) 112/62    Blood Pressure (Exit) 106/60    Heart Rate (Admit) 63 bpm    Heart Rate (Exercise) 86 bpm    Heart Rate (Exit) 76 bpm    Oxygen Saturation (Admit) 99 %    Oxygen Saturation (Exercise) 96 %    Oxygen Saturation (Exit) 96 %    Rating of Perceived Exertion (Exercise) 12    Perceived Dyspnea (Exercise) 3    Duration Continue with 30 min of aerobic exercise without signs/symptoms of physical distress.    Intensity THRR unchanged      Progression   Progression Continue to progress workloads to maintain intensity without signs/symptoms of physical distress.      Resistance Training   Training Prescription Yes    Weight 4    Reps 10-15      Treadmill   MPH 2.5    Grade 1.5    Minutes 15    METs 3.43      REL-XR   Level 2    Speed 61    Minutes 15    METs 4.6      Oxygen   Maintain Oxygen Saturation 88% or higher             Nutrition:  Target Goals: Understanding of nutrition guidelines, daily intake of sodium 1500mg , cholesterol 200mg , calories 30% from fat and 7% or less from saturated fats, daily to have 5 or more servings of fruits and vegetables.  Biometrics:  Pre Biometrics - 07/04/23 1104       Pre Biometrics   Height 5\' 8"  (1.727 m)    Weight 178 lb 8 oz (81 kg)    Waist Circumference 38.5 inches    Hip Circumference 38.5 inches    Waist to Hip Ratio 1 %    BMI (Calculated) 27.15    Grip Strength 26.8 kg    Single Leg Stand 30 seconds              Nutrition Therapy Plan and Nutrition Goals:  Nutrition Therapy & Goals - 07/04/23 1407  Intervention Plan   Intervention Prescribe, educate and counsel regarding individualized specific dietary modifications aiming towards  targeted core components such as weight, hypertension, lipid management, diabetes, heart failure and other comorbidities.;Nutrition handout(s) given to patient.    Expected Outcomes Short Term Goal: Understand basic principles of dietary content, such as calories, fat, sodium, cholesterol and nutrients.;Long Term Goal: Adherence to prescribed nutrition plan.             Nutrition Assessments:  MEDIFICTS Score Key: >=70 Need to make dietary changes  40-70 Heart Healthy Diet <= 40 Therapeutic Level Cholesterol Diet  Flowsheet Row PULMONARY REHAB OTHER RESP ORIENTATION from 07/04/2023 in [facility] CARDIAC REHABILITATION  Picture Your Plate Total Score on Admission 56      Picture Your Plate Scores: <16 Unhealthy dietary pattern with much room for improvement. 41-50 Dietary pattern unlikely to meet recommendations for good health and room for improvement. 51-60 More healthful dietary pattern, with some room for improvement.  >60 Healthy dietary pattern, although there may be some specific behaviors that could be improved.    Nutrition Goals Re-Evaluation:   Nutrition Goals Discharge (Final Nutrition Goals Re-Evaluation):   Psychosocial: Target Goals: Acknowledge presence or absence of significant depression and/or stress, maximize coping skills, provide positive support system. Participant is able to verbalize types and ability to use techniques and skills needed for reducing stress and depression.  Initial Review & Psychosocial Screening:  Initial Psych Review & Screening - 07/04/23 1096       Initial Review   Current issues with Current Stress Concerns;Current Sleep Concerns    Source of Stress Concerns Chronic Illness;Unable to participate in former interests or hobbies;Unable to perform yard/household activities    Comments new puppy, had to put previous dog down after a stroke, history not sleeping well and up to bathroom (non alcholic beer helps too), breathing limits his  activity any time he does anythign      Family Dynamics   Good Support System? Yes   wife (nurse), family lives in [place]     Barriers   Psychosocial barriers to participate in program The patient should benefit from training in stress management and relaxation.;Psychosocial barriers identified (see note)      Screening Interventions   Interventions Encouraged to exercise;Provide feedback about the scores to participant;To provide support and resources with identified psychosocial needs    Expected Outcomes Short Term goal: Utilizing psychosocial counselor, staff and physician to assist with identification of specific Stressors or current issues interfering with healing process. Setting desired goal for each stressor or current issue identified.;Long Term Goal: Stressors or current issues are controlled or eliminated.;Short Term goal: Identification and review with participant of any Quality of Life or Depression concerns found by scoring the questionnaire.;Long Term goal: The participant improves quality of Life and PHQ9 Scores as seen by post scores and/or verbalization of changes             Quality of Life Scores:  Scores of 19 and below usually indicate a poorer quality of life in these areas.  A difference of  2-3 points is a clinically meaningful difference.  A difference of 2-3 points in the total score of the Quality of Life Index has been associated with significant improvement in overall quality of life, self-image, physical symptoms, and general health in studies assessing change in quality of life.   PHQ-9: Review Flowsheet  More data exists      07/04/2023 05/20/2022 03/25/2022 02/24/2022 05/20/2021  Depression screen PHQ  2/9  Decreased Interest 1 0 0 0 0  Down, Depressed, Hopeless 0 0 0 0 0  PHQ - 2 Score 1 0 0 0 0  Altered sleeping 1 - - - -  Tired, decreased energy 2 - - - -  Change in appetite 2 - - - -  Feeling bad or failure about yourself  0 - - - -  Trouble  concentrating 1 - - - -  Moving slowly or fidgety/restless 0 - - - -  Suicidal thoughts 0 - - - -  PHQ-9 Score 7 - - - -   Interpretation of Total Score  Total Score Depression Severity:  1-4 = Minimal depression, 5-9 = Mild depression, 10-14 = Moderate depression, 15-19 = Moderately severe depression, 20-27 = Severe depression   Psychosocial Evaluation and Intervention:  Psychosocial Evaluation - 07/04/23 1106       Psychosocial Evaluation & Interventions   Interventions Relaxation education;Encouraged to exercise with the program and follow exercise prescription;Stress management education    Comments Caleb Butler is coming into pulmonary rehab for ILD.  He also has heart history.  He has done the program previously for both cardiac and pulmonary and is looking forward to building up his stamina and being able to breathe better again.  He lives at home with his wife and they rest of their family is in [place]. [place] farm with horses, dogs, cats, chickens, and a donkey.  He does not precieve any barriers to attending rehab.  He is eager to get going and be able to not have to stop as much. He did lose his dog a few weeks ago and then his wife found a new [place] puppy.  So they now have a new puppy to train and she has been scratching his arms up.    Expected Outcomes Short: Attend rehab to build stamina Long: Conitnue to enjoy puppy    Continue Psychosocial Services  Follow up required by staff             Psychosocial Re-Evaluation:   Psychosocial Discharge (Final Psychosocial Re-Evaluation):    Education: Education Goals: Education classes will be provided on a weekly basis, covering required topics. Participant will state understanding/return demonstration of topics presented.  Learning Barriers/Preferences:  Learning Barriers/Preferences - 07/04/23 5638       Learning Barriers/Preferences   Learning Barriers Sight;Hearing   glasses, hearing aids (stopped  working)   Acupuncturist None             Education Topics: How Lungs Work and Diseases: - Discuss the anatomy of the lungs and diseases that can affect the lungs, such as COPD. Flowsheet Row PULMONARY REHAB OTHER RESPIRATORY from 12/29/2016 in [facility]  Date 12/29/16  Educator GC  Instruction Review Code 2- meets goals/outcomes       Exercise: -Discuss the importance of exercise, FITT principles of exercise, normal and abnormal responses to exercise, and how to exercise safely. Flowsheet Row PULMONARY REHAB OTHER RESPIRATORY from 10/20/2016 in [place] CARDIAC REHABILITATION  Date 10/06/16  Educator Jacelyn Grip  Instruction Review Code 2- meets goals/outcomes       Environmental Irritants: -Discuss types of environmental irritants and how to limit exposure to environmental irritants. Flowsheet Row PULMONARY REHAB OTHER RESPIRATORY from 10/20/2016 in [facility]  Date 10/13/16  Educator GC  Instruction Review Code 2- meets goals/outcomes       Meds/Inhalers and oxygen: - Discuss respiratory medications, definition  of an inhaler and oxygen, and the proper way to use an inhaler and oxygen. Flowsheet Row PULMONARY REHAB OTHER RESPIRATORY from 10/20/2016 in [place] [facility]  Date 10/20/16  Educator GC  Instruction Review Code 2- meets goals/outcomes       Energy Saving Techniques: - Discuss methods to conserve energy and decrease shortness of breath when performing activities of daily living.  Flowsheet Row PULMONARY REHAB OTHER RESPIRATORY from 12/29/2016 in [place] [facility]  Date 10/27/16  Educator Jacelyn Grip  Instruction Review Code 2- meets goals/outcomes       Bronchial Hygiene / Breathing Techniques: - Discuss breathing mechanics, pursed-lip breathing technique,  proper posture, effective ways to clear airways, and other functional breathing techniques   Cleaning  Equipment: - Provides group verbal and written instruction about the health risks of elevated stress, cause of high stress, and healthy ways to reduce stress.   Nutrition I: Fats: - Discuss the types of cholesterol, what cholesterol does to the body, and how cholesterol levels can be controlled. Flowsheet Row CARDIAC REHAB PHASE II EXERCISE from 06/14/2017 in [place] [facility]  Date 05/24/17  Educator DC  Instruction Review Code 2- Demonstrated Understanding       Nutrition II: Labels: -Discuss the different components of food labels and how to read food labels. Flowsheet Row CARDIAC REHAB PHASE II EXERCISE from 06/14/2017 in [place] [facility]  Date 05/31/17  Educator GC  Instruction Review Code 2- Demonstrated Understanding       Respiratory Infections: - Discuss the signs and symptoms of respiratory infections, ways to prevent respiratory infections, and the importance of seeking medical treatment when having a respiratory infection. Flowsheet Row PULMONARY REHAB OTHER RESPIRATORY from 07/20/2023 in [place] [facility]  Date 07/06/23  Educator HB  Instruction Review Code 1- Verbalizes Understanding       Stress I: Signs and Symptoms: - Discuss the causes of stress, how stress may lead to anxiety and depression, and ways to limit stress. Flowsheet Row CARDIAC REHAB PHASE II EXERCISE from 06/14/2017 in [place] [facility]  Date 06/14/17  Educator DJ  Instruction Review Code 2- Demonstrated Understanding       Stress II: Relaxation: -Discuss relaxation techniques to limit stress. Flowsheet Row CARDIAC REHAB PHASE II EXERCISE from 06/14/2017 in [place] [facility]  Date 03/15/17  Educator DJ  Instruction Review Code 2- Demonstrated Understanding       Oxygen for Home/Travel: - Discuss how to prepare for travel when on oxygen and proper ways to transport and store oxygen to ensure  safety. Flowsheet Row PULMONARY REHAB OTHER RESPIRATORY from 10/20/2016 in [place] [facility]  Date 09/22/16  Educator GC  Instruction Review Code 2- meets goals/outcomes       Knowledge Questionnaire Score:  Knowledge Questionnaire Score - 07/04/23 1411       Knowledge Questionnaire Score   Pre Score 15/18             Core Components/Risk Factors/Patient Goals at Admission:  Personal Goals and Risk Factors at Admission - 07/04/23 1411       Core Components/Risk Factors/Patient Goals on Admission    Weight Management Yes;Weight Loss    Intervention Weight Management: Develop a combined nutrition and exercise program designed to reach desired caloric intake, while maintaining appropriate intake of nutrient and fiber, sodium and fats, and appropriate energy expenditure required for the weight goal.;Weight Management: Provide education and appropriate resources to help participant work on and  attain dietary goals.;Weight Management/Obesity: Establish reasonable short term and long term weight goals.    Admit Weight 178 lb 8 oz (81 kg)    Goal Weight: Short Term 173 lb (78.5 kg)    Goal Weight: Long Term 170 lb (77.1 kg)    Expected Outcomes Short Term: Continue to assess and modify interventions until short term weight is achieved;Long Term: Adherence to nutrition and physical activity/exercise program aimed toward attainment of established weight goal;Weight Loss: Understanding of general recommendations for a balanced deficit meal plan, which promotes 1-2 lb weight loss per week and includes a negative energy balance of [phone] kcal/d;Understanding recommendations for meals to include 15-35% energy as protein, 25-35% energy from fat, 35-60% energy from carbohydrates, less than 200mg  of dietary cholesterol, 20-35 gm of total fiber daily;Understanding of distribution of calorie intake throughout the day with the consumption of 4-5 meals/snacks    Improve shortness of  breath with ADL's Yes    Intervention Provide education, individualized exercise plan and daily activity instruction to help decrease symptoms of SOB with activities of daily living.    Expected Outcomes Short Term: Improve cardiorespiratory fitness to achieve a reduction of symptoms when performing ADLs;Long Term: Be able to perform more ADLs without symptoms or delay the onset of symptoms    Increase knowledge of respiratory medications and ability to use respiratory devices properly  Yes    Intervention Provide education and demonstration as needed of appropriate use of medications, inhalers, and oxygen therapy.    Expected Outcomes Short Term: Achieves understanding of medications use. Understands that oxygen is a medication prescribed by physician. Demonstrates appropriate use of inhaler and oxygen therapy.;Long Term: Maintain appropriate use of medications, inhalers, and oxygen therapy.    Diabetes Yes    Intervention Provide education about signs/symptoms and action to take for hypo/hyperglycemia.;Provide education about proper nutrition, including hydration, and aerobic/resistive exercise prescription along with prescribed medications to achieve blood glucose in normal ranges: Fasting glucose 65-99 mg/dL    Expected Outcomes Short Term: Participant verbalizes understanding of the signs/symptoms and immediate care of hyper/hypoglycemia, proper foot care and importance of medication, aerobic/resistive exercise and nutrition plan for blood glucose control.;Long Term: Attainment of HbA1C < 7%.    Heart Failure Yes    Intervention Provide a combined exercise and nutrition program that is supplemented with education, support and counseling about heart failure. Directed toward relieving symptoms such as shortness of breath, decreased exercise tolerance, and extremity edema.    Expected Outcomes Improve functional capacity of life;Short term: Attendance in program 2-3 days a week with increased exercise  capacity. Reported lower sodium intake. Reported increased fruit and vegetable intake. Reports medication compliance.;Short term: Daily weights obtained and reported for increase. Utilizing diuretic protocols set by physician.;Long term: Adoption of self-care skills and reduction of barriers for early signs and symptoms recognition and intervention leading to self-care maintenance.    Lipids Yes    Intervention Provide education and support for participant on nutrition & aerobic/resistive exercise along with prescribed medications to achieve LDL 70mg , HDL >40mg .    Expected Outcomes Short Term: Participant states understanding of desired cholesterol values and is compliant with medications prescribed. Participant is following exercise prescription and nutrition guidelines.;Long Term: Cholesterol controlled with medications as prescribed, with individualized exercise RX and with personalized nutrition plan. Value goals: LDL < 70mg , HDL > 40 mg.             Core Components/Risk Factors/Patient Goals Review:    Core Components/Risk Factors/Patient Goals  at Discharge (Final Review):    ITP Comments:  ITP Comments     Row Name 07/04/23 1051 07/26/23 0940         ITP Comments Patient attend orientation today.  Patient is attending Pulmonary Rehabilitation Program.  Documentation for diagnosis can be found in 06/19/23 and 06/23/23.  Reviewed medical chart, RPE/RPD, gym safety, and program guidelines.  Patient was fitted to equipment they will be using during rehab.  Patient is scheduled to start exercise on Thursday 07/06/23 at 1030.   Initial ITP created and sent for review and signature by Dr. Erick Blinks, Medical Director for Pulmonary Rehabilitation Program. 30 day review completed. ITP sent to Dr.Jehanzeb Memon, Medical Director of  Pulmonary Rehab. Continue with ITP unless changes are made by physician. Pt is newer to program.               Comments: 30 day review

## 2023-07-27 ENCOUNTER — Encounter (HOSPITAL_COMMUNITY)
Admission: RE | Admit: 2023-07-27 | Discharge: 2023-07-27 | Disposition: A | Discharge status: Home / Self Care | Payer: No Typology Code available for payment source | Source: Ambulatory Visit | Attending: Cardiology

## 2023-07-27 DIAGNOSIS — J849 Interstitial pulmonary disease, unspecified: Secondary | ICD-10-CM

## 2023-07-27 NOTE — Progress Notes (Signed)
 Daily Session Note  Patient Details  Name: Johntavius Shepard MRN: [id number] Date of Birth: 01/23/1946 Referring Provider:   Flowsheet Row PULMONARY REHAB OTHER RESP ORIENTATION from 07/04/2023 in [facility]  Referring Provider Dina Rich MD  [facility] Pulmonologist: Dr. Isaiah Serge       Encounter Date: 07/27/2023  Check In:  Session Check In - 07/27/23 1038       Check-In   Supervising physician immediately available to respond to emergencies See telemetry face sheet for immediately available MD    Location [facility]    Staff Present Ross Ludwig, BS, Exercise Physiologist;Brittany Foley, BSN, RN;Tache Bobst Laural Benes, RN, BSN    Virtual Visit No    Medication changes reported     No    Fall or balance concerns reported    No    Tobacco Cessation No Change    Warm-up and Cool-down Performed on first and last piece of equipment    Resistance Training Performed Yes    VAD Patient? No    PAD/SET Patient? No      Pain Assessment   Currently in Pain? No/denies             Capillary Blood Glucose: No results found for this or any previous visit (from the past 24 hours).    Social History   Tobacco Use  Smoking Status Never  Smokeless Tobacco Never    Goals Met:  Proper associated with RPD/PD & O2 Sat Independence with exercise equipment Using PLB without cueing & demonstrates good technique Exercise tolerated well No report of concerns or symptoms today Strength training completed today  Goals Unmet:  Not Applicable  Comments: Pt able to follow exercise prescription today without complaint.  Will continue to monitor for progression.

## 2023-07-31 ENCOUNTER — Ambulatory Visit: Payer: Self-pay | Admitting: Family Medicine

## 2023-07-31 ENCOUNTER — Encounter: Payer: Self-pay | Admitting: Family Medicine

## 2023-07-31 ENCOUNTER — Ambulatory Visit (INDEPENDENT_AMBULATORY_CARE_PROVIDER_SITE_OTHER): Admitting: Family Medicine

## 2023-07-31 ENCOUNTER — Telehealth (HOSPITAL_COMMUNITY): Payer: Self-pay | Admitting: *Deleted

## 2023-07-31 VITALS — BP 122/74 | HR 86 | Temp 98.5°F | Ht 68.0 in | Wt 179.2 lb

## 2023-07-31 DIAGNOSIS — E1165 Type 2 diabetes mellitus with hyperglycemia: Secondary | ICD-10-CM | POA: Diagnosis not present

## 2023-07-31 DIAGNOSIS — H538 Other visual disturbances: Secondary | ICD-10-CM | POA: Diagnosis not present

## 2023-07-31 DIAGNOSIS — Z7985 Long-term (current) use of injectable non-insulin antidiabetic drugs: Secondary | ICD-10-CM

## 2023-07-31 NOTE — Telephone Encounter (Signed)
  Chief Complaint: blurry vision  Symptoms: blurry vision, dizziness  Frequency: comes and goes   Disposition: [] ED /[] Urgent Care (no appt availability in office) / [x] Appointment(In office/virtual)/ []  [facility]/ [] Home Care/ [] Refused Recommended Disposition /[] [place] [facility]/ []  Follow-up with PCP Additional Notes: Pt complaining of blurry vision on and off for a couple of weeks with both eyes. Pt states the blurry vision causes dizziness at times. Pt states he has had blurry vision since 11am today. Pt had eyes checked in January with no issues.Pt wears glasses and states when he takes them off it is still blurry. Pt denies any headache, weakness, or speech problems. Per protocol, pt to be seen within 24 hours. Pt has app today at 1530. RN gave care advice and pt verbalized understanding.              Copied from CRM [id number]. Topic: Clinical - Red Word Triage >> Jul 31, 2023  2:52 PM Ebonie J wrote: [facility] that prompted transfer to Nurse Triage: blurry vision for and dizziness Reason for Disposition  [1] Brief (now gone) blurred vision AND [2] unexplained  Answer Assessment - Initial Assessment Questions 1. DESCRIPTION: "How has your vision changed?" (e.g., complete vision loss, blurred vision, double vision, floaters, etc.)     Blurred vision  2. LOCATION: "One or both eyes?" If one, ask: "Which eye?"     both 3. SEVERITY: "Can you see anything?" If Yes, ask: "What can you see?" (e.g., fine print)     Yes  4. ONSET: "When did this begin?" "Did it start suddenly or has this been gradual?"     Few weeks ago  5. PATTERN: "Does this come and go, or has it been constant since it started?"     Comes and goes  6. PAIN: "Is there any pain in your eye(s)?"  (Scale 1-10; or mild, moderate, severe)   - NONE (0): No pain.   - MILD (1-3): Doesn't interfere with normal activities.   - MODERATE (4-7): Interferes with normal activities or awakens from sleep.    -  SEVERE (8-10): Excruciating pain, unable to do any normal activities.     Denies  7. CONTACTS-GLASSES: "Do you wear contacts or glasses?"     Glasses  8. CAUSE: "What do you think is causing this visual problem?"     Not sure  9. OTHER SYMPTOMS: "Do you have any other symptoms?" (e.g., confusion, headache, arm or leg weakness, speech problems)     Denies  Protocols used: Vision Loss or Change-A-AH

## 2023-07-31 NOTE — Telephone Encounter (Signed)
-----   Message from Dina Rich sent at 07/30/2023  5:30 PM EST ----- Regarding: RE: AAA Clearance for Pulmonary Rehab Small aneurysm, no limitations  Dominga Ferry MD ----- Message ----- From: Hazle Nordmann Sent: 07/04/2023   7:53 AM EST To: Antoine Poche, MD Subject: AAA Clearance for Pulmonary Rehab              Good Morning Dr. Wyline Mood,  Mr. [name] has orientation this morning and has a 4.1 AAA noted.  Does he have any restrictions for BP or exercise?  Thanks! Fabio Pierce, MA, RCEP, CCRP 07/04/2023 7:53 AM   EXERCISE PARAMETERS FOR PATIENTS WITH ABDOMINAL AORTIC ANEURYSM  BLOOD PRESSURE PARAMETERS: SYSTOLIC  DYSTOLIC EQUIPMENT:  please circle  Treadmill  Y N  If yes, indicate max.  Incline/grade _______  [employer] Bike Y N  Arm Crank  Y N  Recumbent Bike Y N  Track   Y N       Recumbent Elliptical Y [employer] (hand) Y N         If yes, please circle the maximum pound allowed       1    2     3     4     5     6     7     8     10

## 2023-07-31 NOTE — Progress Notes (Signed)
 Patient Office Visit  Assessment & Plan:  Blurry vision, bilateral -     Ambulatory referral to Ophthalmology  Type 2 diabetes mellitus with hyperglycemia, without long-term current use of insulin (HCC) -     Ambulatory referral to Ophthalmology -     COMPLETE METABOLIC PANEL WITH GFR -     Hemoglobin A1c -     Lipid panel   Offered nutrition counseling but patient declined.  Ophthalmology consult ordered to evaluate blurry vision/possible double vision.  Follow-up on lab work notify patient.  If he has Worsening symptoms need to go to ER for further evaluation.  Patient will reduce the Ozempic to 0.25 mg weekly and increase his protein intake i.e. have protein with each meal during the day.  Reduce his junk food i.e. candy. No follow-ups on file.   Subjective:    Patient ID: Caleb Butler, male    DOB: 09/08/1945  Age: 78 y.o. MRN: [id number]  Chief Complaint  Patient presents with   Blurred Vision    X2 weeks. Everything is blurry per pt.    HPI Type 2 diabetes-previously controlled. Last A1C 6.8. BS were in 200 range but now much improved.  FBS 124, 114 this past week. Denies diarrhea, peripheral swelling, hypoglycemia, excessive thirst, excessive urination, and fatigue.  Has had blurry vision which is fairly new the past 2 weeks. Making an effort on diet control and exercise.  Has an up-to-date dilated eye exam, was seen at ophthalmology in Feb at [facility] and told his eyes were fine  Using medication as prescribed without difficulty.  Patient's weight has decreased.  Patient does eat healthy for the most part and stays active. Pt reduced Ozempic dosage about one mos ago. Was 0.5mg  and then increase to 1 mg so went back to 0.5mg  per week. Pt does like candy good and plenty box consumed in 3 days.  Wife patient has not been eating 3 meals a day and not getting enough protein.  Patient does think he could do better with his diet.  Patient does not want to see a nutritionist  regarding diabetes. Bilateral Blurry vision-patient has noticed bilateral blurry vision the past couple weeks especially when he is looking far away while driving.  Patient states that he is does not notice it when he is looking up close.  Patient does wear glasses and was told that he did not need a new prescription per ophthalmology last month at [place].  Pt has beginnings of cataracts per [place]. No loss of vision, sometimes has double vision but not all the time.  Patient noticed the double vision today when he was driving here.  Patient no longer has this in office. Patient does have a previous history of floaters but has not seen floaters in the past couple weeks.  No previous history of retinal detachment or diabetic retinopathy.  Patient's wife wonders if the Ozempic is causing some of these issues. Pt was on 1mg  and then reduced to 0.5mg  Ozempic this past month.   The ASCVD Risk score (Arnett DK, et al., 2019) failed to calculate for the following reasons:   Risk score cannot be calculated because patient has a medical history suggesting prior/existing ASCVD  Past Medical History:  Diagnosis Date   Anxiety    Aortic aneurysm (HCC)    dilated aortic root on echo 2024   Arthritis    "my back is loaded w/it" (01/06/2017)   CAD (coronary artery disease)    80% ostial  lesion in L circumflex (2017)   Chronic lower back pain    GERD (gastroesophageal reflux disease)    Hiatal hernia    High cholesterol    Hypothyroidism    IBS (irritable bowel syndrome)    Numbness and tingling of right lower extremity    "since 11/30/2016" (01/06/2017)   PMR (polymyalgia rheumatica) (HCC)    Stroke (HCC)    Type 2 diabetes mellitus (HCC)    Past Surgical History:  Procedure Laterality Date   CARDIAC CATHETERIZATION  06/2016   CORONARY ANGIOPLASTY WITH STENT PLACEMENT  01/06/2017   CORONARY STENT INTERVENTION N/A 01/06/2017   Procedure: CORONARY STENT INTERVENTION;  Surgeon: Tonny Bollman, MD;  [facility]  INVASIVE CV LAB;  Service: Cardiovascular;  Laterality: N/A;   FINGER SURGERY Left 1984   ring finger reattached.    FOREARM FRACTURE SURGERY Right 2004   "has 3 rods and 22 screws in it"   INGUINAL HERNIA REPAIR Left    RIGHT/LEFT HEART CATH AND CORONARY ANGIOGRAPHY N/A 07/07/2016   Procedure: Right/Left Heart Cath and Coronary Angiography;  Surgeon: Dolores Patty, MD;  Location: [facility] INVASIVE CV LAB;  Service: Cardiovascular;  Laterality: N/A;   RIGHT/LEFT HEART CATH AND CORONARY ANGIOGRAPHY N/A 03/13/2018   Procedure: RIGHT/LEFT HEART CATH AND CORONARY ANGIOGRAPHY;  Surgeon: Dolores Patty, MD;  Location: [facility] INVASIVE CV LAB;  Service: Cardiovascular;  Laterality: N/A;   Social History   Tobacco Use   Smoking status: Never   Smokeless tobacco: Never  Vaping Use   Vaping status: Never Used  Substance Use Topics   Alcohol use: Not Currently    Alcohol/week: 2.0 standard drinks of alcohol    Types: 1 Glasses of wine, 1 Cans of beer per week    Comment: 1 beer/week   Drug use: No   Family History  Problem Relation Age of Onset   Diabetes Mellitus II Father    Heart failure Father    Kidney failure Father    Heart attack Mother    Allergies  Allergen Reactions   Bee Venom Swelling   Ceclor [Cefaclor] Hives   Ciprofloxacin Hives   Jardiance [Empagliflozin]    Metformin And Related Diarrhea    upsesomach    ROS    Objective:    BP 122/74   Pulse 86   Temp 98.5 F (36.9 C)   Ht 5\' 8"  (1.727 m)   Wt 179 lb 4 oz (81.3 kg)   SpO2 94%   BMI 27.25 kg/m  BP Readings from Last 3 Encounters:  07/31/23 122/74  06/23/23 114/74  06/19/23 114/74   Wt Readings from Last 3 Encounters:  07/31/23 179 lb 4 oz (81.3 kg)  07/04/23 178 lb 8 oz (81 kg)  06/23/23 178 lb 3.2 oz (80.8 kg)    Physical Exam Vitals and nursing note reviewed.  Constitutional:      Appearance: Normal appearance.     Comments: Patient comes in with his wife.  HENT:     Head: Normocephalic.      Right Ear: Tympanic membrane, ear canal and external ear normal.     Left Ear: Tympanic membrane, ear canal and external ear normal.  Eyes:     General: Lids are normal. Vision grossly intact.     Extraocular Movements: Extraocular movements intact.     Right eye: No nystagmus.     Left eye: No nystagmus.     Conjunctiva/sclera: Conjunctivae normal.     Pupils: Pupils are equal, round,  and reactive to light.  Neck:     Vascular: No carotid bruit.  Cardiovascular:     Rate and Rhythm: Normal rate and regular rhythm.     Heart sounds: Normal heart sounds.  Pulmonary:     Effort: Pulmonary effort is normal.     Breath sounds: Normal breath sounds. No wheezing.  Musculoskeletal:     Right lower leg: No edema.     Left lower leg: No edema.  Neurological:     General: No focal deficit present.     Mental Status: He is alert and oriented to person, place, and time.     Cranial Nerves: Cranial nerves 2-12 are intact. No cranial nerve deficit.     Coordination: Coordination is intact.     Gait: Gait is intact. Gait normal.  Psychiatric:        Mood and Affect: Mood normal.        Behavior: Behavior normal.      No results found for any visits on 07/31/23.

## 2023-08-01 ENCOUNTER — Encounter (HOSPITAL_COMMUNITY)
Admission: RE | Admit: 2023-08-01 | Discharge: 2023-08-01 | Disposition: A | Discharge status: Home / Self Care | Payer: No Typology Code available for payment source | Source: Ambulatory Visit | Attending: Cardiology | Admitting: Cardiology

## 2023-08-01 DIAGNOSIS — R0602 Shortness of breath: Secondary | ICD-10-CM | POA: Insufficient documentation

## 2023-08-01 DIAGNOSIS — J849 Interstitial pulmonary disease, unspecified: Secondary | ICD-10-CM | POA: Diagnosis present

## 2023-08-01 LAB — COMPLETE METABOLIC PANEL WITH GFR
AG Ratio: 1.9 (calc) (ref 1.0–2.5)
ALT: 17 U/L (ref 9–46)
AST: 20 U/L (ref 10–35)
Albumin: 4.4 g/dL (ref 3.6–5.1)
Alkaline phosphatase (APISO): 57 U/L (ref 35–144)
BUN: 24 mg/dL (ref 7–25)
CO2: 26 mmol/L (ref 20–32)
Calcium: 9.8 mg/dL (ref 8.6–10.3)
Chloride: 105 mmol/L (ref 98–110)
Creat: 1.23 mg/dL (ref 0.70–1.28)
Globulin: 2.3 g/dL (ref 1.9–3.7)
Glucose, Bld: 135 mg/dL — ABNORMAL HIGH (ref 65–99)
Potassium: 4 mmol/L (ref 3.5–5.3)
Sodium: 140 mmol/L (ref 135–146)
Total Bilirubin: 0.5 mg/dL (ref 0.2–1.2)
Total Protein: 6.7 g/dL (ref 6.1–8.1)
eGFR: 60 mL/min/{1.73_m2} (ref >=60)

## 2023-08-01 LAB — LIPID PANEL
Cholesterol: 113 mg/dL (ref <200)
HDL: 47 mg/dL (ref >=40)
LDL Cholesterol (Calc): 46 mg/dL
Non-HDL Cholesterol (Calc): 66 mg/dL (ref <130)
Total CHOL/HDL Ratio: 2.4 (calc) (ref <5.0)
Triglycerides: 110 mg/dL (ref <150)

## 2023-08-01 LAB — HEMOGLOBIN A1C
Hgb A1c MFr Bld: 7.7 %{Hb} — ABNORMAL HIGH (ref <5.7)
Mean Plasma Glucose: 174 mg/dL
eAG (mmol/L): 9.7 mmol/L

## 2023-08-01 NOTE — Progress Notes (Signed)
 Daily Session Note  Patient Details  Name: Caleb Butler MRN: [id number] Date of Birth: 1945/10/16 Referring Provider:   Flowsheet Row PULMONARY REHAB OTHER RESP ORIENTATION from 07/04/2023 in [facility] CARDIAC REHABILITATION  Referring Provider Dina Rich MD  [facility] Pulmonologist: Dr. Isaiah Serge       Encounter Date: 08/01/2023  Check In:  Session Check In - 08/01/23 1030       Check-In   Supervising physician immediately available to respond to emergencies See telemetry face sheet for immediately available MD    Location [facility]    Staff Present Ross Ludwig, BS, Exercise Physiologist;Jessica Juanetta Gosling, MA, RCEP, CCRP, CCET    Virtual Visit No    Medication changes reported     No    Fall or balance concerns reported    No    Tobacco Cessation No Change    Warm-up and Cool-down Performed on first and last piece of equipment    Resistance Training Performed Yes    VAD Patient? No    PAD/SET Patient? No      Pain Assessment   Currently in Pain? No/denies    Multiple Pain Sites No             Capillary Blood Glucose: Results for orders placed or performed in visit on 07/31/23 (from the past 24 hours)  COMPLETE METABOLIC PANEL WITH GFR     Status: Abnormal   Collection Time: 07/31/23  4:05 PM  Result Value Ref Range   Glucose, Bld 135 (H) 65 - 99 mg/dL   BUN 24 7 - 25 mg/dL   Creat 4.09 8.11 - 9.14 mg/dL   eGFR 60 > OR = 60 NW/GNF/6.21H0   BUN/Creatinine Ratio SEE NOTE: 6 - 22 (calc)   Sodium 140 135 - 146 mmol/L   Potassium 4.0 3.5 - 5.3 mmol/L   Chloride 105 98 - 110 mmol/L   CO2 26 20 - 32 mmol/L   Calcium 9.8 8.6 - 10.3 mg/dL   Total Protein 6.7 6.1 - 8.1 g/dL   Albumin 4.4 3.6 - 5.1 g/dL   Globulin 2.3 1.9 - 3.7 g/dL (calc)   AG Ratio 1.9 1.0 - 2.5 (calc)   Total Bilirubin 0.5 0.2 - 1.2 mg/dL   Alkaline phosphatase (APISO) 57 35 - 144 U/L   AST 20 10 - 35 U/L   ALT 17 9 - 46 U/L  Hemoglobin A1c     Status: Abnormal    Collection Time: 07/31/23  4:05 PM  Result Value Ref Range   Hgb A1c MFr Bld 7.7 (H) <5.7 % of total Hgb   Mean Plasma Glucose 174 mg/dL   eAG (mmol/L) 9.7 mmol/L  Lipid panel     Status: None   Collection Time: 07/31/23  4:05 PM  Result Value Ref Range   Cholesterol 113 <200 mg/dL   HDL 47 > OR = 40 mg/dL   Triglycerides 865 <784 mg/dL   LDL Cholesterol (Calc) 46 mg/dL (calc)   Total CHOL/HDL Ratio 2.4 <5.0 (calc)   Non-HDL Cholesterol (Calc) 66 <696 mg/dL (calc)      Social History   Tobacco Use  Smoking Status Never  Smokeless Tobacco Never    Goals Met:  Independence with exercise equipment Using PLB without cueing & demonstrates good technique Exercise tolerated well No report of concerns or symptoms today Strength training completed today  Goals Unmet:  Not Applicable  Comments: Pt able to follow exercise prescription today without complaint.  Will continue  to monitor for progression.

## 2023-08-03 ENCOUNTER — Encounter (HOSPITAL_COMMUNITY)
Admission: RE | Admit: 2023-08-03 | Discharge: 2023-08-03 | Disposition: A | Discharge status: Home / Self Care | Payer: No Typology Code available for payment source | Source: Ambulatory Visit | Attending: Cardiology | Admitting: Cardiology

## 2023-08-03 DIAGNOSIS — J849 Interstitial pulmonary disease, unspecified: Secondary | ICD-10-CM

## 2023-08-03 NOTE — Progress Notes (Signed)
 Daily Session Note  Patient Details  Name: Caleb Butler MRN: [id number] Date of Birth: [birth year] Referring Provider:   Flowsheet Row PULMONARY REHAB OTHER RESP ORIENTATION from 07/04/2023 in [facility] CARDIAC REHABILITATION  Referring Provider Dina Rich MD  [facility] Pulmonologist: Dr. Isaiah Serge       Encounter Date: 08/03/2023  Check In:  Session Check In - 08/03/23 1015       Check-In   Supervising physician immediately available to respond to emergencies See telemetry face sheet for immediately available ER MD    Location [facility]    Staff Present Ross Ludwig, BS, Exercise Physiologist;Jessica Juanetta Gosling, MA, RCEP, CCRP, CCET;H[employer], RN;Agueda Houpt Laural Benes, RN, BSN    Virtual Visit No    Medication changes reported     No    Fall or balance concerns reported    No    Warm-up and Cool-down Performed on first and last piece of equipment    Resistance Training Performed Yes    VAD Patient? No    PAD/SET Patient? No      Pain Assessment   Currently in Pain? No/denies    Multiple Pain Sites No             Capillary Blood Glucose: No results found for this or any previous visit (from the past 24 hours).    Social History   Tobacco Use  Smoking Status Never  Smokeless Tobacco Never    Goals Met:  Proper associated with RPD/PD & O2 Sat Independence with exercise equipment Using PLB without cueing & demonstrates good technique Exercise tolerated well No report of concerns or symptoms today Strength training completed today  Goals Unmet:  Not Applicable  Comments: Pt able to follow exercise prescription today without complaint.  Will continue to monitor for progression.

## 2023-08-08 ENCOUNTER — Encounter (HOSPITAL_COMMUNITY)
Admission: RE | Admit: 2023-08-08 | Discharge: 2023-08-08 | Disposition: A | Discharge status: Home / Self Care | Payer: No Typology Code available for payment source | Source: Ambulatory Visit | Attending: Cardiology | Admitting: Cardiology

## 2023-08-08 DIAGNOSIS — J849 Interstitial pulmonary disease, unspecified: Secondary | ICD-10-CM | POA: Diagnosis not present

## 2023-08-08 NOTE — Progress Notes (Signed)
 Daily Session Note  Patient Details  Name: Caleb Butler MRN: [id number] Date of Birth: 08/05/1945 Referring Provider:   Flowsheet Row PULMONARY REHAB OTHER RESP ORIENTATION from 07/04/2023 in [facility] CARDIAC REHABILITATION  Referring Provider Dina Rich MD  [facility] Pulmonologist: Dr. Isaiah Serge       Encounter Date: 08/08/2023  Check In:  Session Check In - 08/08/23 1015       Check-In   Supervising physician immediately available to respond to emergencies See telemetry face sheet for immediately available MD    Location [facility]    Staff Present Desiree Lucy, BSN, RN, WTA-C;Heather Fredric Mare, BS, Exercise Physiologist;Jessica Juanetta Gosling, MA, RCEP, CCRP, CCET    Virtual Visit No    Medication changes reported     No    Fall or balance concerns reported    No    Tobacco Cessation No Change    Warm-up and Cool-down Performed on first and last piece of equipment    Resistance Training Performed Yes    VAD Patient? No    PAD/SET Patient? No      Pain Assessment   Currently in Pain? No/denies             Capillary Blood Glucose: No results found for this or any previous visit (from the past 24 hours).    Social History   Tobacco Use  Smoking Status Never  Smokeless Tobacco Never    Goals Met:  Proper associated with RPD/PD & O2 Sat Independence with exercise equipment Improved SOB with ADL's Using PLB without cueing & demonstrates good technique No report of concerns or symptoms today Strength training completed today  Goals Unmet:  Not Applicable  Comments: Pt able to follow exercise prescription today without complaint.  Will continue to monitor for progression.

## 2023-08-10 ENCOUNTER — Encounter (HOSPITAL_COMMUNITY)
Admission: RE | Admit: 2023-08-10 | Discharge: 2023-08-10 | Disposition: A | Discharge status: Home / Self Care | Payer: No Typology Code available for payment source | Source: Ambulatory Visit | Attending: Cardiology | Admitting: Cardiology

## 2023-08-10 DIAGNOSIS — J849 Interstitial pulmonary disease, unspecified: Secondary | ICD-10-CM | POA: Diagnosis not present

## 2023-08-10 NOTE — Progress Notes (Signed)
 Daily Session Note  Patient Details  Name: Caleb Butler MRN: [id number] Date of Birth: [birth year] Referring Provider:   Flowsheet Row PULMONARY REHAB OTHER RESP ORIENTATION from 07/04/2023 in [facility] CARDIAC REHABILITATION  Referring Provider Dina Rich MD  [facility] Pulmonologist: Dr. Isaiah Serge       Encounter Date: 08/10/2023  Check In:  Session Check In - 08/10/23 1015       Check-In   Supervising physician immediately available to respond to emergencies See telemetry face sheet for immediately available MD    Location [facility]    Staff Present Avanell Shackleton BSN, RN;Brittany Roseanne Reno, BSN, RN, WTA-C;Heather Fredric Mare, BS, Exercise Physiologist    Virtual Visit No    Medication changes reported     No    Fall or balance concerns reported    No    Tobacco Cessation No Change    Warm-up and Cool-down Performed on first and last piece of equipment    Resistance Training Performed Yes    VAD Patient? No    PAD/SET Patient? No      Pain Assessment   Currently in Pain? No/denies    Multiple Pain Sites No             Capillary Blood Glucose: No results found for this or any previous visit (from the past 24 hours).    Social History   Tobacco Use  Smoking Status Never  Smokeless Tobacco Never    Goals Met:  Proper associated with RPD/PD & O2 Sat Independence with exercise equipment Using PLB without cueing & demonstrates good technique Exercise tolerated well Queuing for purse lip breathing No report of concerns or symptoms today Strength training completed today  Goals Unmet:  Not Applicable  Comments: Marland KitchenMarland KitchenPt able to follow exercise prescription today without complaint.  Will continue to monitor for progression.

## 2023-08-15 ENCOUNTER — Encounter (HOSPITAL_COMMUNITY)
Admission: RE | Admit: 2023-08-15 | Discharge: 2023-08-15 | Disposition: A | Discharge status: Home / Self Care | Payer: No Typology Code available for payment source | Source: Ambulatory Visit | Attending: Cardiology | Admitting: Cardiology

## 2023-08-15 DIAGNOSIS — J849 Interstitial pulmonary disease, unspecified: Secondary | ICD-10-CM | POA: Diagnosis not present

## 2023-08-15 NOTE — Progress Notes (Signed)
 Daily Session Note  Patient Details  Name: Thierno Hun MRN: [id number] Date of Birth: 05-08-1946 Referring Provider:   Flowsheet Row PULMONARY REHAB OTHER RESP ORIENTATION from 07/04/2023 in [facility] CARDIAC REHABILITATION  Referring Provider Dina Rich MD  [facility] Pulmonologist: Dr. Isaiah Serge       Encounter Date: 08/15/2023  Check In:  Session Check In - 08/15/23 1037       Check-In   Supervising physician immediately available to respond to emergencies See telemetry face sheet for immediately available MD    Location [facility]    Staff Present Fabio Pierce, MA, RCEP, CCRP, CCET;Heather Fredric Mare, BS, Exercise Physiologist;Brittany Roseanne Reno, BSN, RN, WTA-C    Virtual Visit No    Medication changes reported     No    Fall or balance concerns reported    No    Warm-up and Cool-down Performed on first and last piece of equipment    Resistance Training Performed Yes    VAD Patient? No    PAD/SET Patient? No      Pain Assessment   Currently in Pain? No/denies             Capillary Blood Glucose: No results found for this or any previous visit (from the past 24 hours).    Social History   Tobacco Use  Smoking Status Never  Smokeless Tobacco Never    Goals Met:  Proper associated with RPD/PD & O2 Sat Independence with exercise equipment Using PLB without cueing & demonstrates good technique Exercise tolerated well No report of concerns or symptoms today Strength training completed today  Goals Unmet:  Not Applicable  Comments: Pt able to follow exercise prescription today without complaint.  Will continue to monitor for progression.

## 2023-08-17 ENCOUNTER — Encounter (HOSPITAL_COMMUNITY)
Admission: RE | Admit: 2023-08-17 | Discharge: 2023-08-17 | Disposition: A | Discharge status: Home / Self Care | Payer: No Typology Code available for payment source | Source: Ambulatory Visit | Attending: Cardiology | Admitting: Cardiology

## 2023-08-17 DIAGNOSIS — J849 Interstitial pulmonary disease, unspecified: Secondary | ICD-10-CM | POA: Diagnosis not present

## 2023-08-17 NOTE — Progress Notes (Signed)
 Daily Session Note  Patient Details  Name: Caleb Butler MRN: [id number] Date of Birth: 02-26-1946 Referring Provider:   Flowsheet Row PULMONARY REHAB OTHER RESP ORIENTATION from 07/04/2023 in [facility] CARDIAC REHABILITATION  Referring Provider Dina Rich MD  [facility] Pulmonologist: Dr. Isaiah Serge       Encounter Date: 08/17/2023  Check In:  Session Check In - 08/17/23 1015       Check-In   Supervising physician immediately available to respond to emergencies See telemetry face sheet for immediately available MD    Location [facility]    Staff Present Avanell Shackleton BSN, RN;Heather Fredric Mare, BS, Exercise Physiologist;Debra Laural Benes, RN, BSN    Virtual Visit No    Medication changes reported     No    Fall or balance concerns reported    No    Tobacco Cessation No Change    Warm-up and Cool-down Performed on first and last piece of equipment    Resistance Training Performed Yes    VAD Patient? No    PAD/SET Patient? No      Pain Assessment   Currently in Pain? No/denies    Multiple Pain Sites No             Capillary Blood Glucose: No results found for this or any previous visit (from the past 24 hours).    Social History   Tobacco Use  Smoking Status Never  Smokeless Tobacco Never    Goals Met:  Proper associated with RPD/PD & O2 Sat Independence with exercise equipment Using PLB without cueing & demonstrates good technique Exercise tolerated well Queuing for purse lip breathing No report of concerns or symptoms today Strength training completed today  Goals Unmet:  Not Applicable  Comments: Marland KitchenMarland KitchenPt able to follow exercise prescription today without complaint.  Will continue to monitor for progression.

## 2023-08-22 ENCOUNTER — Encounter (HOSPITAL_COMMUNITY)
Admission: RE | Admit: 2023-08-22 | Discharge: 2023-08-22 | Disposition: A | Discharge status: Home / Self Care | Payer: No Typology Code available for payment source | Source: Ambulatory Visit | Attending: Cardiology

## 2023-08-22 DIAGNOSIS — R0602 Shortness of breath: Secondary | ICD-10-CM

## 2023-08-22 DIAGNOSIS — J849 Interstitial pulmonary disease, unspecified: Secondary | ICD-10-CM

## 2023-08-22 NOTE — Progress Notes (Signed)
 Daily Session Note  Patient Details  Name: Dionne Rossa MRN: [id number] Date of Birth: 13-Aug-1945 Referring Provider:   Flowsheet Row PULMONARY REHAB OTHER RESP ORIENTATION from 07/04/2023 in [facility] CARDIAC REHABILITATION  Referring Provider Dina Rich MD  [facility] Pulmonologist: Dr. Isaiah Serge       Encounter Date: 08/22/2023  Check In:  Session Check In - 08/22/23 1030       Check-In   Supervising physician immediately available to respond to emergencies See telemetry face sheet for immediately available MD    Location [facility]    Staff Present Ross Ludwig, BS, Exercise Physiologist;Jessica Juanetta Gosling, MA, RCEP, CCRP, CCET;Brittany Roseanne Reno, BSN, RN, WTA-C    Virtual Visit No    Medication changes reported     No    Fall or balance concerns reported    No    Tobacco Cessation No Change    Warm-up and Cool-down Performed on first and last piece of equipment    Resistance Training Performed Yes    VAD Patient? No    PAD/SET Patient? No      Pain Assessment   Currently in Pain? No/denies    Multiple Pain Sites No             Capillary Blood Glucose: No results found for this or any previous visit (from the past 24 hours).    Social History   Tobacco Use  Smoking Status Never  Smokeless Tobacco Never    Goals Met:  Independence with exercise equipment Exercise tolerated well No report of concerns or symptoms today Strength training completed today  Goals Unmet:  Not Applicable  Comments: Pt able to follow exercise prescription today without complaint.  Will continue to monitor for progression.

## 2023-08-23 ENCOUNTER — Encounter (HOSPITAL_COMMUNITY): Payer: Self-pay | Admitting: *Deleted

## 2023-08-23 DIAGNOSIS — J849 Interstitial pulmonary disease, unspecified: Secondary | ICD-10-CM

## 2023-08-23 NOTE — Progress Notes (Signed)
 Pulmonary Individual Treatment Plan  Patient Details  Name: Caleb Butler MRN: [id number] Date of Birth: 1945/07/17 Referring Provider:   Flowsheet Row PULMONARY REHAB OTHER RESP ORIENTATION from 07/04/2023 in [facility] CARDIAC REHABILITATION  Referring Provider Dina Rich MD  [Primary Pulmonologist: Dr. Isaiah Serge       Initial Encounter Date:  Flowsheet Row PULMONARY REHAB OTHER RESP ORIENTATION from 07/04/2023 in [facility] CARDIAC REHABILITATION  Date 07/04/23       Visit Diagnosis: ILD (interstitial lung disease) (HCC)  Patient's Home Medications on Admission:   Current Outpatient Medications:    Accu-Chek FastClix Lancets MISC, USE TO CHECK BLOOD SUGAR TWICE DAILY E11.9, Disp: 102 each, Rfl: 12   acetaminophen (TYLENOL) 500 MG tablet, Take 500-1,000 mg by mouth daily as needed for moderate pain or headache., Disp: , Rfl:    aspirin 81 MG EC tablet, Take 81 mg by mouth at bedtime. , Disp: , Rfl:    blood glucose meter kit and supplies, Dispense based on patient and insurance preference. Use up to four times daily as directed. (FOR ICD-10 E10.9, E11.9)., Disp: 1 each, Rfl: 0   Blood Glucose Monitoring Suppl DEVI, 1 each by Does not apply route in the morning, at noon, and at bedtime. May substitute to any manufacturer covered by patient's insurance. Needs Onetouch by [employer], Disp: 1 each, Rfl: 0   Capsaicin 0.1 % CREA, Apply topically as needed., Disp: , Rfl:    clopidogrel (PLAVIX) 75 MG tablet, TAKE 1 TABLET BY MOUTH EVERY DAY, Disp: 90 tablet, Rfl: 0   famotidine (PEPCID) 40 MG tablet, Take 40 mg by mouth daily., Disp: , Rfl:    levothyroxine (SYNTHROID) 75 MCG tablet, Take 1 tablet (75 mcg total) by mouth daily., Disp: 90 tablet, Rfl: 3   mometasone (ELOCON) 0.1 % ointment, Apply topically daily., Disp: 45 g, Rfl: 0   Multiple Vitamin (MULTIVITAMIN WITH MINERALS) TABS tablet, Take 1 tablet by mouth daily., Disp: , Rfl:    rosuvastatin (CRESTOR) 20 MG tablet, TAKE  1 TABLET BY MOUTH EVERY DAY, Disp: 90 tablet, Rfl: 3   Semaglutide,0.25 or 0.5MG /DOS, (OZEMPIC, 0.25 OR 0.5 MG/DOSE,) 2 MG/1.5ML SOPN, Inject 0.5 mg into the skin once a week., Disp: 1.5 mL, Rfl: 3   Tiotropium Bromide-Olodaterol (STIOLTO RESPIMAT) 2.5-2.5 MCG/ACT AERS, Inhale 2 puffs into the lungs daily., Disp: 1 each, Rfl: 5   traZODone (DESYREL) 50 MG tablet, Take 1 tablet (50 mg total) by mouth at bedtime. (Patient not taking: Reported on 07/31/2023), Disp: 90 tablet, Rfl: 3   vitamin B-12 (CYANOCOBALAMIN) 1000 MCG tablet, Take 1,000 mcg by mouth daily., Disp: , Rfl:   Past Medical History: Past Medical History:  Diagnosis Date   Anxiety    Aortic aneurysm (HCC)    dilated aortic root on echo 2024   Arthritis    "my back is loaded w/it" (01/06/2017)   CAD (coronary artery disease)    80% ostial lesion in L circumflex (2017)   Chronic lower back pain    GERD (gastroesophageal reflux disease)    Hiatal hernia    High cholesterol    Hypothyroidism    IBS (irritable bowel syndrome)    Numbness and tingling of right lower extremity    "since 11/30/2016" (01/06/2017)   PMR (polymyalgia rheumatica) (HCC)    Stroke (HCC)    Type 2 diabetes mellitus (HCC)     Tobacco Use: Social History   Tobacco Use  Smoking Status Never  Smokeless Tobacco Never  Labs: Review Flowsheet  More data exists      Latest Ref Rng & Units 08/12/2019 02/20/2020 02/24/2022 04/04/2023 07/31/2023  Labs for ITP Cardiac and Pulmonary Rehab  Cholestrol <200 mg/dL 440  - 347  425  956   LDL (calc) mg/dL (calc) 48  - 45  35  46   HDL-C > OR = 40 mg/dL 40  - 45  40  47   Trlycerides <150 mg/dL 387  - 564  332  951   Hemoglobin A1c <5.7 % of total Hgb 6.6  7.5  6.8  6.8  7.7     Capillary Blood Glucose: Lab Results  Component Value Date   GLUCAP 171 (H) 07/13/2023   GLUCAP 225 (H) 07/06/2023   GLUCAP 111 (H) 03/13/2018   GLUCAP 167 (H) 01/07/2017   GLUCAP 178 (H) 01/06/2017     Pulmonary Assessment  Scores:  Pulmonary Assessment Scores     Row Name 07/04/23 1413         ADL UCSD   ADL Phase Entry     SOB Score total 25     Rest 0     Walk 1     Stairs 3     Bath 0     Dress 1     Shop 1       CAT Score   CAT Score 16       mMRC Score   mMRC Score 2             UCSD: Self-administered rating of dyspnea associated with activities of daily living (ADLs) 6-point scale (0 = "not at all" to 5 = "maximal or unable to do because of breathlessness")  Scoring Scores range from 0 to 120.  Minimally important difference is 5 units  CAT: CAT can identify the health impairment of COPD patients and is better correlated with disease progression.  CAT has a scoring range of zero to 40. The CAT score is classified into four groups of low (less than 10), medium (10 - 20), high (21-30) and very high (31-40) based on the impact level of disease on health status. A CAT score over 10 suggests significant symptoms.  A worsening CAT score could be explained by an exacerbation, poor medication adherence, poor inhaler technique, or progression of COPD or comorbid conditions.  CAT MCID is 2 points  mMRC: mMRC (Modified Medical Research Council) Dyspnea Scale is used to assess the degree of baseline functional disability in patients of respiratory disease due to dyspnea. No minimal important difference is established. A decrease in score of 1 point or greater is considered a positive change.   Pulmonary Function Assessment:  Pulmonary Function Assessment - 07/04/23 1413       Breath   Shortness of Breath Yes;Limiting activity;Fear of Shortness of Breath             Exercise Target Goals: Exercise Program Goal: Individual exercise prescription set using results from initial 6 min walk test and THRR while considering  patient's activity barriers and safety.   Exercise Prescription Goal: Initial exercise prescription builds to 30-45 minutes a day of aerobic activity, 2-3 days per  week.  Home exercise guidelines will be given to patient during program as part of exercise prescription that the participant will acknowledge.  Activity Barriers & Risk Stratification:  Activity Barriers & Cardiac Risk Stratification - 07/04/23 0824       Activity Barriers & Cardiac Risk Stratification   Activity Barriers Joint  Problems;Arthritis;Deconditioning;History of Falls;Muscular Weakness;Back Problems;Shortness of Breath   disc compression, chronic back pain uses horse linament, r leg numbness            6 Minute Walk:  6 Minute Walk     Row Name 07/04/23 1052         6 Minute Walk   Phase Initial     Distance 1140 feet     Walk Time 6 minutes     # of Rest Breaks 0     MPH 2.16     METS 2.14     RPE 11     Perceived Dyspnea  1     VO2 Peak 7.49     Symptoms Yes (comment)     Comments SOB     Resting HR 78 bpm     Resting BP 116/62     Resting Oxygen Saturation  96 %     Exercise Oxygen Saturation  during 6 min walk 95 %     Max Ex. HR 89 bpm     Max Ex. BP 126/64     2 Minute Post BP 118/62       Interval HR   1 Minute HR 86     2 Minute HR 88     3 Minute HR 87     4 Minute HR 88     5 Minute HR 79     6 Minute HR 89     2 Minute Post HR 81     Interval Heart Rate? Yes       Interval Oxygen   Interval Oxygen? Yes     Baseline Oxygen Saturation % 96 %     1 Minute Oxygen Saturation % 95 %     1 Minute Liters of Oxygen 0 L  Room Air     2 Minute Oxygen Saturation % 95 %     2 Minute Liters of Oxygen 0 L     3 Minute Oxygen Saturation % 95 %     3 Minute Liters of Oxygen 0 L     4 Minute Oxygen Saturation % 96 %     4 Minute Liters of Oxygen 0 L     5 Minute Oxygen Saturation % 95 %     5 Minute Liters of Oxygen 0 L     6 Minute Oxygen Saturation % 96 %     6 Minute Liters of Oxygen 0 L     2 Minute Post Oxygen Saturation % 96 %     2 Minute Post Liters of Oxygen 0 L              Oxygen Initial Assessment:  Oxygen Initial Assessment  - 07/04/23 0827       Home Oxygen   Home Oxygen Device None    Sleep Oxygen Prescription None    Home Exercise Oxygen Prescription None    Home Resting Oxygen Prescription None      Initial 6 min Walk   Oxygen Used None      Program Oxygen Prescription   Program Oxygen Prescription None      Intervention   Short Term Goals To learn and understand importance of monitoring SPO2 with pulse oximeter and demonstrate accurate use of the pulse oximeter.;To learn and understand importance of maintaining oxygen saturations>88%;To learn and demonstrate proper pursed lip breathing techniques or other breathing techniques. ;To learn and demonstrate proper use of respiratory medications  Long  Term Goals Maintenance of O2 saturations>88%;Compliance with respiratory medication;Verbalizes importance of monitoring SPO2 with pulse oximeter and return demonstration;Exhibits proper breathing techniques, such as pursed lip breathing or other method taught during program session;Demonstrates proper use of MDI's             Oxygen Re-Evaluation:   Oxygen Discharge (Final Oxygen Re-Evaluation):   Initial Exercise Prescription:  Initial Exercise Prescription - 07/04/23 1100       Date of Initial Exercise RX and Referring Provider   Date 07/04/23    Referring Provider Dina Rich MD   Primary Pulmonologist: Dr. Isaiah Serge     Oxygen   Maintain Oxygen Saturation 88% or higher      Treadmill   MPH 2    Grade 0.5    Minutes 15    METs 2.67      REL-XR   Level 2    Speed 50    Minutes 15    METs 2.5      Prescription Details   Frequency (times per week) 2    Duration Progress to 30 minutes of continuous aerobic without signs/symptoms of physical distress      Intensity   THRR 40-80% of Max Heartrate 104-130    Ratings of Perceived Exertion 11-13    Perceived Dyspnea 0-4      Progression   Progression Continue to progress workloads to maintain intensity without signs/symptoms of  physical distress.      Resistance Training   Training Prescription Yes    Weight 4 lb    Reps 10-15             Perform Capillary Blood Glucose checks as needed.  Exercise Prescription Changes:   Exercise Prescription Changes     Row Name 07/04/23 1100 07/11/23 1200 07/20/23 1200 08/08/23 1200       Response to Exercise   Blood Pressure (Admit) 116/62 118/60 110/60 112/62    Blood Pressure (Exercise) 126/64 118/62 112/62 --    Blood Pressure (Exit) 112/66 108/68 106/60 110/60    Heart Rate (Admit) 78 bpm 78 bpm 63 bpm 61 bpm    Heart Rate (Exercise) 89 bpm 103 bpm 86 bpm 100 bpm    Heart Rate (Exit) 84 bpm 85 bpm 76 bpm 81 bpm    Oxygen Saturation (Admit) 96 % 96 % 99 % 97 %    Oxygen Saturation (Exercise) 95 % 93 % 96 % 96 %    Oxygen Saturation (Exit) 93 % 97 % 96 % 97 %    Rating of Perceived Exertion (Exercise) 11 15 12 13     Perceived Dyspnea (Exercise) 1 3 3 3     Symptoms SOb -- -- --    Comments walk test results -- -- --    Duration -- Continue with 30 min of aerobic exercise without signs/symptoms of physical distress. Continue with 30 min of aerobic exercise without signs/symptoms of physical distress. Continue with 30 min of aerobic exercise without signs/symptoms of physical distress.    Intensity -- THRR unchanged THRR unchanged THRR unchanged      Progression   Progression -- Continue to progress workloads to maintain intensity without signs/symptoms of physical distress. Continue to progress workloads to maintain intensity without signs/symptoms of physical distress. Continue to progress workloads to maintain intensity without signs/symptoms of physical distress.      Resistance Training   Training Prescription -- Yes Yes Yes    Weight -- 4 4 4     Reps --  10-15 10-15 10-15      Treadmill   MPH -- 2.5 2.5 3    Grade -- 0.5 1.5 2    Minutes -- 15 15 15     METs -- 3.09 3.43 4.12      REL-XR   Level -- 2 2 2     Speed -- 83 61 62    Minutes -- 15 15  15     METs -- 4.7 4.6 3.6      Oxygen   Maintain Oxygen Saturation -- -- 88% or higher 88% or higher             Exercise Comments:   Exercise Goals and Review:   Exercise Goals     Row Name 07/04/23 1103             Exercise Goals   Increase Physical Activity Yes       Intervention Provide advice, education, support and counseling about physical activity/exercise needs.;Develop an individualized exercise prescription for aerobic and resistive training based on initial evaluation findings, risk stratification, comorbidities and participant's personal goals.       Expected Outcomes Short Term: Attend rehab on a regular basis to increase amount of physical activity.;Long Term: Add in home exercise to make exercise part of routine and to increase amount of physical activity.;Long Term: Exercising regularly at least 3-5 days a week.       Increase Strength and Stamina Yes       Intervention Provide advice, education, support and counseling about physical activity/exercise needs.;Develop an individualized exercise prescription for aerobic and resistive training based on initial evaluation findings, risk stratification, comorbidities and participant's personal goals.       Expected Outcomes Short Term: Increase workloads from initial exercise prescription for resistance, speed, and METs.;Short Term: Perform resistance training exercises routinely during rehab and add in resistance training at home;Long Term: Improve cardiorespiratory fitness, muscular endurance and strength as measured by increased METs and functional capacity ( )       Able to understand and use rate of perceived exertion (RPE) scale Yes       Intervention Provide education and explanation on how to use RPE scale       Expected Outcomes Short Term: Able to use RPE daily in rehab to express subjective intensity level;Long Term:  Able to use RPE to guide intensity level when exercising independently       Able to  understand and use Dyspnea scale Yes       Intervention Provide education and explanation on how to use Dyspnea scale       Expected Outcomes Short Term: Able to use Dyspnea scale daily in rehab to express subjective sense of shortness of breath during exertion;Long Term: Able to use Dyspnea scale to guide intensity level when exercising independently       Knowledge and understanding of Target Heart Rate Range (THRR) Yes       Intervention Provide education and explanation of THRR including how the numbers were predicted and where they are located for reference       Expected Outcomes Short Term: Able to state/look up THRR;Short Term: Able to use daily as guideline for intensity in rehab;Long Term: Able to use THRR to govern intensity when exercising independently       Able to check pulse independently Yes       Intervention Provide education and demonstration on how to check pulse in carotid and radial arteries.;Review the importance of being able to check  your own pulse for safety during independent exercise       Expected Outcomes Short Term: Able to explain why pulse checking is important during independent exercise;Long Term: Able to check pulse independently and accurately       Understanding of Exercise Prescription Yes       Intervention Provide education, explanation, and written materials on patient's individual exercise prescription       Expected Outcomes Short Term: Able to explain program exercise prescription;Long Term: Able to explain home exercise prescription to exercise independently                Exercise Goals Re-Evaluation :  Exercise Goals Re-Evaluation     Row Name 07/11/23 1251 07/24/23 0823 08/04/23 0748 08/09/23 0806       Exercise Goal Re-Evaluation   Exercise Goals Review Increase Physical Activity;Increase Strength and Stamina;Understanding of Exercise Prescription Increase Physical Activity;Increase Strength and Stamina;Understanding of Exercise  Prescription Increase Physical Activity;Increase Strength and Stamina;Understanding of Exercise Prescription Increase Physical Activity;Increase Strength and Stamina;Understanding of Exercise Prescription    Comments Casimiro Needle is doing well in rehab and is tolerating exercise well. He is SOB when he gets off the treadmill. He is only on his 3rd visit. Will continue to montior and progress as able. Casimiro Needle is doing well in rehab and is tolerating exercise well. He is SOB when he gets off the treadmill. Will continue to montior and progress as able. Casimiro Needle is doing well in rehab. 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 He is enjoying the program and getting to talk to everyone in class. Kathlene November is doing well in rehab and is on his 11th visit. He has been increasing his speed on the treadmill to 3.0 with a grade of 2.0. Will continue to monitor and progress as able.    Expected Outcomes Continue to attend rehab Continue to attend rehab Continue to attend rehab and exercise outside of class with continues walking continue to attend rehab             Discharge Exercise Prescription (Final Exercise Prescription Changes):  Exercise Prescription Changes - 08/08/23 1200       Response to Exercise   Blood Pressure (Admit) 112/62    Blood Pressure (Exit) 110/60    Heart Rate (Admit) 61 bpm    Heart Rate (Exercise) 100 bpm    Heart Rate (Exit) 81 bpm    Oxygen Saturation (Admit) 97 %    Oxygen Saturation (Exercise) 96 %    Oxygen Saturation (Exit) 97 %    Rating of Perceived Exertion (Exercise) 13    Perceived Dyspnea (Exercise) 3    Duration Continue with 30 min of aerobic exercise without signs/symptoms of physical distress.    Intensity THRR unchanged      Progression   Progression Continue to progress workloads to maintain intensity without signs/symptoms of physical distress.       Resistance Training   Training Prescription Yes    Weight 4    Reps 10-15      Treadmill   MPH 3    Grade 2    Minutes 15    METs 4.12      REL-XR   Level 2    Speed 62    Minutes 15    METs 3.6      Oxygen   Maintain Oxygen Saturation  88% or higher             Nutrition:  Target Goals: Understanding of nutrition guidelines, daily intake of sodium 1500mg , cholesterol 200mg , calories 30% from fat and 7% or less from saturated fats, daily to have 5 or more servings of fruits and vegetables.  Biometrics:  Pre Biometrics - 07/04/23 1104       Pre Biometrics   Height 5\' 8"  (1.727 m)    Weight 178 lb 8 oz (81 kg)    Waist Circumference 38.5 inches    Hip Circumference 38.5 inches    Waist to Hip Ratio 1 %    BMI (Calculated) 27.15    Grip Strength 26.8 kg    Single Leg Stand 30 seconds              Nutrition Therapy Plan and Nutrition Goals:  Nutrition Therapy & Goals - 07/04/23 1407       Intervention Plan   Intervention Prescribe, educate and counsel regarding individualized specific dietary modifications aiming towards targeted core components such as weight, hypertension, lipid management, diabetes, heart failure and other comorbidities.;Nutrition handout(s) given to patient.    Expected Outcomes Short Term Goal: Understand basic principles of dietary content, such as calories, fat, sodium, cholesterol and nutrients.;Long Term Goal: Adherence to prescribed nutrition plan.             Nutrition Assessments:  MEDIFICTS Score Key: >=70 Need to make dietary changes  40-70 Heart Healthy Diet <= 40 Therapeutic Level Cholesterol Diet  Flowsheet Row PULMONARY REHAB OTHER RESP ORIENTATION from 07/04/2023 in [facility] CARDIAC REHABILITATION  Picture Your Plate Total Score on Admission 56      Picture Your Plate Scores: <65 Unhealthy dietary pattern with much room for improvement. 41-50 Dietary pattern unlikely to meet recommendations for good health  and room for improvement. 51-60 More healthful dietary pattern, with some room for improvement.  >60 Healthy dietary pattern, although there may be some specific behaviors that could be improved.    Nutrition Goals Re-Evaluation:  Nutrition Goals Re-Evaluation     Row Name 08/04/23 0750             Goals   Nutrition Goal Healthy eating       Comment Kathlene November is doing well in rehab. He has been cutting out sweets and watching what he eats. He is on ozempic so he is trying to eat healthier and watching portion control/       Expected Outcome Continue to eat healthy and work on cutting out sweets and watching portion control                Nutrition Goals Discharge (Final Nutrition Goals Re-Evaluation):  Nutrition Goals Re-Evaluation - 08/04/23 0750       Goals   Nutrition Goal Healthy eating    Comment Kathlene November is doing well in rehab. He has been cutting out sweets and watching what he eats. He is on ozempic so he is trying to eat healthier and watching portion control/    Expected Outcome Continue to eat healthy and work on cutting out sweets and watching portion control             Psychosocial: Target Goals: Acknowledge presence or absence of significant depression and/or stress, maximize coping skills, provide positive support system. Participant is able to verbalize types and ability to use techniques and skills needed for reducing stress and depression.  Initial Review & Psychosocial Screening:  Initial Psych Review &  Screening - 07/04/23 0865       Initial Review   Current issues with Current Stress Concerns;Current Sleep Concerns    Source of Stress Concerns Chronic Illness;Unable to participate in former interests or hobbies;Unable to perform yard/household activities    Comments new puppy, had to put previous dog down after a stroke, history not sleeping well and up to bathroom (non alcholic beer helps too), breathing limits his activity any time he does anythign       Family Dynamics   Good Support System? Yes   wife (nurse), family lives in [place]     Barriers   Psychosocial barriers to participate in program The patient should benefit from training in stress management and relaxation.;Psychosocial barriers identified (see note)      Screening Interventions   Interventions Encouraged to exercise;Provide feedback about the scores to participant;To provide support and resources with identified psychosocial needs    Expected Outcomes Short Term goal: Utilizing psychosocial counselor, staff and physician to assist with identification of specific Stressors or current issues interfering with healing process. Setting desired goal for each stressor or current issue identified.;Long Term Goal: Stressors or current issues are controlled or eliminated.;Short Term goal: Identification and review with participant of any Quality of Life or Depression concerns found by scoring the questionnaire.;Long Term goal: The participant improves quality of Life and PHQ9 Scores as seen by post scores and/or verbalization of changes             Quality of Life Scores:  Scores of 19 and below usually indicate a poorer quality of life in these areas.  A difference of  2-3 points is a clinically meaningful difference.  A difference of 2-3 points in the total score of the Quality of Life Index has been associated with significant improvement in overall quality of life, self-image, physical symptoms, and general health in studies assessing change in quality of life.   PHQ-9: Review Flowsheet  More data exists      08/03/2023 07/04/2023 05/20/2022 03/25/2022 02/24/2022  Depression screen PHQ 2/9  Decreased Interest 2 1 0 0 0  Down, Depressed, Hopeless 0 0 0 0 0  PHQ - 2 Score 2 1 0 0 0  Altered sleeping 0 1 - - -  Tired, decreased energy 2 2 - - -  Change in appetite 1 2 - - -  Feeling bad or failure about yourself  0 0 - - -  Trouble concentrating 1 1 - - -  Moving slowly  or fidgety/restless 0 0 - - -  Suicidal thoughts 0 0 - - -  PHQ-9 Score 6 7 - - -  Difficult doing work/chores Somewhat difficult - - - -   Interpretation of Total Score  Total Score Depression Severity:  1-4 = Minimal depression, 5-9 = Mild depression, 10-14 = Moderate depression, 15-19 = Moderately severe depression, 20-27 = Severe depression   Psychosocial Evaluation and Intervention:  Psychosocial Evaluation - 07/04/23 1106       Psychosocial Evaluation & Interventions   Interventions Relaxation education;Encouraged to exercise with the program and follow exercise prescription;Stress management education    Comments Casimiro Needle is coming into pulmonary rehab for ILD.  He also has heart history.  He has done the program previously for both cardiac and pulmonary and is looking forward to building up his stamina and being able to breathe better again.  He lives at home with his wife and they rest of their family is in [place]. They live  on a farm with horses, dogs, cats, chickens, and a donkey.  He does not precieve any barriers to attending rehab.  He is eager to get going and be able to not have to stop as much. He did lose his dog a few weeks ago and then his wife found a new [place] puppy.  So they now have a new puppy to train and she has been scratching his arms up.    Expected Outcomes Short: Attend rehab to build stamina Long: Conitnue to enjoy puppy    Continue Psychosocial Services  Follow up required by staff             Psychosocial Re-Evaluation:  Psychosocial Re-Evaluation     Row Name 08/03/23 1106 08/04/23 0750           Psychosocial Re-Evaluation   Current issues with Current Depression;Current Sleep Concerns;Current Stress Concerns Current Depression;Current Sleep Concerns;Current Stress Concerns      Comments Reviewed patient health questionnaire (PHQ-9) with patient for follow up. Previously, patients score indicated signs/symptoms of depression.   Reviewed to see if patient is improving symptom wise while in program.  Score improved and patient states that it is because they have been getting out and feeling better with exercise.  He continues to struggle with sleep which leaves him tired at night. He is on trazadone, but only works some.  He is also having some vision disturbances that have him feeling dizzy and unsure.  He is meeting with his doctor about it next week. --      Expected Outcomes Short; Talk to doctor Long: Continue to exericse to help with sleep. --      Interventions Encouraged to attend Pulmonary Rehabilitation for the exercise;Stress management education;Relaxation education --      Continue Psychosocial Services  Follow up required by staff --               Psychosocial Discharge (Final Psychosocial Re-Evaluation):  Psychosocial Re-Evaluation - 08/04/23 0750       Psychosocial Re-Evaluation   Current issues with Current Depression;Current Sleep Concerns;Current Stress Concerns              Education: Education Goals: Education classes will be provided on a weekly basis, covering required topics. Participant will state understanding/return demonstration of topics presented.  Learning Barriers/Preferences:  Learning Barriers/Preferences - 07/04/23 0981       Learning Barriers/Preferences   Learning Barriers Sight;Hearing   glasses, hearing aids (stopped working)   Acupuncturist None             Education Topics: How Lungs Work and Diseases: - Discuss the anatomy of the lungs and diseases that can affect the lungs, such as COPD. Flowsheet Row PULMONARY REHAB OTHER RESPIRATORY from 12/29/2016 in [place] [facility]  Date 12/29/16  Educator GC  Instruction Review Code 2- meets goals/outcomes       Exercise: -Discuss the importance of exercise, FITT principles of exercise, normal and abnormal responses to exercise, and how to exercise safely. Flowsheet Row PULMONARY  REHAB OTHER RESPIRATORY from 10/20/2016 in [place] [facility]  Date 10/06/16  Educator Jacelyn Grip  Instruction Review Code 2- meets goals/outcomes       Environmental Irritants: -Discuss types of environmental irritants and how to limit exposure to environmental irritants. Flowsheet Row PULMONARY REHAB OTHER RESPIRATORY from 10/20/2016 in [place] [facility]  Date 10/13/16  Educator GC  Instruction Review Code 2- meets goals/outcomes  Meds/Inhalers and oxygen: - Discuss respiratory medications, definition of an inhaler and oxygen, and the proper way to use an inhaler and oxygen. Flowsheet Row PULMONARY REHAB OTHER RESPIRATORY from 10/20/2016 in [place] [facility]  Date 10/20/16  Educator GC  Instruction Review Code 2- meets goals/outcomes       Energy Saving Techniques: - Discuss methods to conserve energy and decrease shortness of breath when performing activities of daily living.  Flowsheet Row PULMONARY REHAB OTHER RESPIRATORY from 12/29/2016 in [place] [facility]  Date 10/27/16  Educator Jacelyn Grip  Instruction Review Code 2- meets goals/outcomes       Bronchial Hygiene / Breathing Techniques: - Discuss breathing mechanics, pursed-lip breathing technique,  proper posture, effective ways to clear airways, and other functional breathing techniques   Cleaning Equipment: - Provides group verbal and written instruction about the health risks of elevated stress, cause of high stress, and healthy ways to reduce stress.   Nutrition I: Fats: - Discuss the types of cholesterol, what cholesterol does to the body, and how cholesterol levels can be controlled. Flowsheet Row PULMONARY REHAB OTHER RESPIRATORY from 08/17/2023 in [place] [facility]  Date 08/03/23  Educator HB  Instruction Review Code 1- Verbalizes Understanding       Nutrition II: Labels: -Discuss the different components of food  labels and how to read food labels. Flowsheet Row CARDIAC REHAB PHASE II EXERCISE from 06/14/2017 in [place] [facility]  Date 05/31/17  Educator GC  Instruction Review Code 2- Demonstrated Understanding       Respiratory Infections: - Discuss the signs and symptoms of respiratory infections, ways to prevent respiratory infections, and the importance of seeking medical treatment when having a respiratory infection. Flowsheet Row PULMONARY REHAB OTHER RESPIRATORY from 08/17/2023 in [place] [facility]  Date 07/06/23  Educator HB  Instruction Review Code 1- Verbalizes Understanding       Stress I: Signs and Symptoms: - Discuss the causes of stress, how stress may lead to anxiety and depression, and ways to limit stress. Flowsheet Row PULMONARY REHAB OTHER RESPIRATORY from 08/17/2023 in [place] [facility]  Date 08/10/23  Educator HB  Instruction Review Code 1- Verbalizes Understanding       Stress II: Relaxation: -Discuss relaxation techniques to limit stress. Flowsheet Row CARDIAC REHAB PHASE II EXERCISE from 06/14/2017 in [place] [facility]  Date 03/15/17  Educator DJ  Instruction Review Code 2- Demonstrated Understanding       Oxygen for Home/Travel: - Discuss how to prepare for travel when on oxygen and proper ways to transport and store oxygen to ensure safety. Flowsheet Row PULMONARY REHAB OTHER RESPIRATORY from 10/20/2016 in [place] [facility]  Date 09/22/16  Educator GC  Instruction Review Code 2- meets goals/outcomes       Knowledge Questionnaire Score:  Knowledge Questionnaire Score - 07/04/23 1411       Knowledge Questionnaire Score   Pre Score 15/18             Core Components/Risk Factors/Patient Goals at Admission:  Personal Goals and Risk Factors at Admission - 07/04/23 1411       Core Components/Risk Factors/Patient Goals on Admission    Weight Management Yes;Weight Loss     Intervention Weight Management: Develop a combined nutrition and exercise program designed to reach desired caloric intake, while maintaining appropriate intake of nutrient and fiber, sodium and fats, and appropriate energy expenditure required for the weight goal.;Weight Management: Provide education and appropriate resources  to help participant work on and attain dietary goals.;Weight Management/Obesity: Establish reasonable short term and long term weight goals.    Admit Weight 178 lb 8 oz (81 kg)    Goal Weight: Short Term 173 lb (78.5 kg)    Goal Weight: Long Term 170 lb (77.1 kg)    Expected Outcomes Short Term: Continue to assess and modify interventions until short term weight is achieved;Long Term: Adherence to nutrition and physical activity/exercise program aimed toward attainment of established weight goal;Weight Loss: Understanding of general recommendations for a balanced deficit meal plan, which promotes 1-2 lb weight loss per week and includes a negative energy balance of [phone] kcal/d;Understanding recommendations for meals to include 15-35% energy as protein, 25-35% energy from fat, 35-60% energy from carbohydrates, less than 200mg  of dietary cholesterol, 20-35 gm of total fiber daily;Understanding of distribution of calorie intake throughout the day with the consumption of 4-5 meals/snacks    Improve shortness of breath with ADL's Yes    Intervention Provide education, individualized exercise plan and daily activity instruction to help decrease symptoms of SOB with activities of daily living.    Expected Outcomes Short Term: Improve cardiorespiratory fitness to achieve a reduction of symptoms when performing ADLs;Long Term: Be able to perform more ADLs without symptoms or delay the onset of symptoms    Increase knowledge of respiratory medications and ability to use respiratory devices properly  Yes    Intervention Provide education and demonstration as needed of appropriate use of  medications, inhalers, and oxygen therapy.    Expected Outcomes Short Term: Achieves understanding of medications use. Understands that oxygen is a medication prescribed by physician. Demonstrates appropriate use of inhaler and oxygen therapy.;Long Term: Maintain appropriate use of medications, inhalers, and oxygen therapy.    Diabetes Yes    Intervention Provide education about signs/symptoms and action to take for hypo/hyperglycemia.;Provide education about proper nutrition, including hydration, and aerobic/resistive exercise prescription along with prescribed medications to achieve blood glucose in normal ranges: Fasting glucose 65-99 mg/dL    Expected Outcomes Short Term: Participant verbalizes understanding of the signs/symptoms and immediate care of hyper/hypoglycemia, proper foot care and importance of medication, aerobic/resistive exercise and nutrition plan for blood glucose control.;Long Term: Attainment of HbA1C < 7%.    Heart Failure Yes    Intervention Provide a combined exercise and nutrition program that is supplemented with education, support and counseling about heart failure. Directed toward relieving symptoms such as shortness of breath, decreased exercise tolerance, and extremity edema.    Expected Outcomes Improve functional capacity of life;Short term: Attendance in program 2-3 days a week with increased exercise capacity. Reported lower sodium intake. Reported increased fruit and vegetable intake. Reports medication compliance.;Short term: Daily weights obtained and reported for increase. Utilizing diuretic protocols set by physician.;Long term: Adoption of self-care skills and reduction of barriers for early signs and symptoms recognition and intervention leading to self-care maintenance.    Lipids Yes    Intervention Provide education and support for participant on nutrition & aerobic/resistive exercise along with prescribed medications to achieve LDL 70mg , HDL >40mg .    Expected  Outcomes Short Term: Participant states understanding of desired cholesterol values and is compliant with medications prescribed. Participant is following exercise prescription and nutrition guidelines.;Long Term: Cholesterol controlled with medications as prescribed, with individualized exercise RX and with personalized nutrition plan. Value goals: LDL < 70mg , HDL > 40 mg.             Core Components/Risk Factors/Patient Goals Review:  Goals and Risk Factor Review     Row Name 08/04/23 0754             Core Components/Risk Factors/Patient Goals Review   Personal Goals Review Weight Management/Obesity;Improve shortness of breath with ADL's;Other (P)        Review Kathlene November is doing well in rehab. (P)                 Core Components/Risk Factors/Patient Goals at Discharge (Final Review):   Goals and Risk Factor Review - 08/04/23 0754       Core Components/Risk Factors/Patient Goals Review   Personal Goals Review Weight Management/Obesity;Improve shortness of breath with ADL's;Other (P)     Review Kathlene November is doing well in rehab. (P)              ITP Comments:  ITP Comments     Row Name 07/04/23 1051 07/26/23 0940 08/23/23 1137       ITP Comments Patient attend orientation today.  Patient is attending Pulmonary Rehabilitation Program.  Documentation for diagnosis can be found in 06/19/23 and 06/23/23.  Reviewed medical chart, RPE/RPD, gym safety, and program guidelines.  Patient was fitted to equipment they will be using during rehab.  Patient is scheduled to start exercise on Thursday 07/06/23 at 1030.   Initial ITP created and sent for review and signature by Dr. Erick Blinks, Medical Director for Pulmonary Rehabilitation Program. 30 day review completed. ITP sent to Dr.Jehanzeb Memon, Medical Director of  Pulmonary Rehab. Continue with ITP unless changes are made by physician. Pt is newer to program. 30 day review completed. ITP sent to Dr.Jehanzeb Memon, Medical Director of   Pulmonary Rehab. Continue with ITP unless changes are made by physician.              Comments: 30 day review

## 2023-08-24 ENCOUNTER — Encounter (HOSPITAL_COMMUNITY)
Admission: RE | Admit: 2023-08-24 | Discharge: 2023-08-24 | Disposition: A | Discharge status: Home / Self Care | Payer: No Typology Code available for payment source | Source: Ambulatory Visit | Attending: Cardiology

## 2023-08-24 DIAGNOSIS — J849 Interstitial pulmonary disease, unspecified: Secondary | ICD-10-CM | POA: Diagnosis not present

## 2023-08-24 NOTE — Progress Notes (Signed)
 Daily Session Note  Patient Details  Name: Yazan Gatling MRN: [id number] Date of Birth: 02/03/1946 Referring Provider:   Flowsheet Row PULMONARY REHAB OTHER RESP ORIENTATION from 07/04/2023 in [facility] CARDIAC REHABILITATION  Referring Provider Dina Rich MD  [facility] Pulmonologist: Dr. Isaiah Serge       Encounter Date: 08/24/2023  Check In:  Session Check In - 08/24/23 1015       Check-In   Supervising physician immediately available to respond to emergencies See telemetry face sheet for immediately available MD    Location [facility]    Staff Present Ross Ludwig, BS, Exercise Physiologist;Jessica Juanetta Gosling, MA, RCEP, CCRP, Dow Adolph, RN, BSN    Virtual Visit No    Medication changes reported     No    Fall or balance concerns reported    No    Warm-up and Cool-down Performed on first and last piece of equipment    Resistance Training Performed Yes    VAD Patient? No    PAD/SET Patient? No      Pain Assessment   Currently in Pain? No/denies    Multiple Pain Sites No             Capillary Blood Glucose: No results found for this or any previous visit (from the past 24 hours).    Social History   Tobacco Use  Smoking Status Never  Smokeless Tobacco Never    Goals Met:  Proper associated with RPD/PD & O2 Sat Independence with exercise equipment Using PLB without cueing & demonstrates good technique Exercise tolerated well No report of concerns or symptoms today Strength training completed today  Goals Unmet:  Not Applicable  Comments: Pt able to follow exercise prescription today without complaint.  Will continue to monitor for progression.

## 2023-08-29 ENCOUNTER — Encounter (HOSPITAL_COMMUNITY)
Admission: RE | Admit: 2023-08-29 | Discharge: 2023-08-29 | Disposition: A | Discharge status: Home / Self Care | Payer: No Typology Code available for payment source | Source: Ambulatory Visit | Attending: Cardiology | Admitting: Cardiology

## 2023-08-29 DIAGNOSIS — R0602 Shortness of breath: Secondary | ICD-10-CM | POA: Diagnosis present

## 2023-08-29 DIAGNOSIS — J849 Interstitial pulmonary disease, unspecified: Secondary | ICD-10-CM | POA: Insufficient documentation

## 2023-08-29 NOTE — Progress Notes (Signed)
 Daily Session Note  Patient Details  Name: Caleb Butler MRN: [id number] Date of Birth: 1946/05/16 Referring Provider:   Flowsheet Row PULMONARY REHAB OTHER RESP ORIENTATION from 07/04/2023 in [facility] CARDI[facility] REHABILIT[facility]  Referring Provider Dina Rich MD  [facility] Pulmonologist: Dr. Isaiah Serge       Encounter Date: 08/29/2023  Check In:  Session Check In - 08/29/23 1025       Check-In   Supervising physician immediately available to respond to emergencies See telemetry face sheet for immediately available MD    Location [facility]    Staff Present Desiree Lucy, BSN, RN, Sherlyn Hay, MA, RCEP, CCRP, CCET;Heather [place], [place], Exercise Physiologist;Brooke Elwyn Reach, RN    Virtual Visit No    Medication changes reported     No    Fall or balance concerns reported    No    Tobacco Cessation No Change    Warm-up and Cool-down Performed on first and last piece of equipment    Resistance Training Performed Yes    VAD Patient? No    PAD/SET Patient? No      Pain Assessment   Currently in Pain? No/denies             Capillary Blood Glucose: No results found for this or any previous visit (from the past 24 hours).    Social History   Tobacco Use  Smoking Status Never  Smokeless Tobacco Never    Goals Met:  Proper associated with RPD/PD & O2 Sat Independence with exercise equipment Improved SOB with ADL's No report of concerns or symptoms today Strength training completed today  Goals Unmet:  Not Applicable  Comments: Pt able to follow exercise prescription today without complaint.  Will continue to monitor for progression.

## 2023-08-31 ENCOUNTER — Encounter (HOSPITAL_COMMUNITY)
Admission: RE | Admit: 2023-08-31 | Discharge: 2023-08-31 | Disposition: A | Discharge status: Home / Self Care | Payer: No Typology Code available for payment source | Source: Ambulatory Visit | Attending: Cardiology | Admitting: Cardiology

## 2023-08-31 DIAGNOSIS — J849 Interstitial pulmonary disease, unspecified: Secondary | ICD-10-CM

## 2023-08-31 DIAGNOSIS — R0602 Shortness of breath: Secondary | ICD-10-CM

## 2023-08-31 NOTE — Progress Notes (Signed)
 Daily Session Note  Patient Details  Name: Caleb Butler MRN: [id number] Date of Birth: [birth year] Referring Provider:   Flowsheet Row PULMONARY REHAB OTHER RESP ORIENTATION from 07/04/2023 in [facility] CARDIAC REHABILITATION  Referring Provider Dina Rich MD  [facility] Pulmonologist: Dr. Isaiah Serge       Encounter Date: 08/31/2023  Check In:  Session Check In - 08/31/23 1043       Check-In   Supervising physician immediately available to respond to emergencies See telemetry face sheet for immediately available ER MD    Location [facility]    Staff Present Ross Ludwig, BS, Exercise Physiologist;Jacksyn Beeks Jake Shark, Margarite Gouge, RN, BSN    Virtual Visit No    Medication changes reported     No    Fall or balance concerns reported    No    Warm-up and Cool-down Performed on first and last piece of equipment    Resistance Training Performed Yes    VAD Patient? No    PAD/SET Patient? No      Pain Assessment   Multiple Pain Sites No             Capillary Blood Glucose: No results found for this or any previous visit (from the past 24 hours).    Social History   Tobacco Use  Smoking Status Never  Smokeless Tobacco Never    Goals Met:  Independence with exercise equipment Exercise tolerated well No report of concerns or symptoms today Strength training completed today  Goals Unmet:  Not Applicable  Comments: Pt able to follow exercise prescription today without complaint.  Will continue to monitor for progression.

## 2023-09-05 ENCOUNTER — Encounter (HOSPITAL_COMMUNITY)
Admission: RE | Admit: 2023-09-05 | Discharge: 2023-09-05 | Disposition: A | Discharge status: Home / Self Care | Payer: No Typology Code available for payment source | Source: Ambulatory Visit | Attending: Cardiology | Admitting: Cardiology

## 2023-09-05 DIAGNOSIS — J849 Interstitial pulmonary disease, unspecified: Secondary | ICD-10-CM | POA: Diagnosis not present

## 2023-09-05 NOTE — Progress Notes (Signed)
 Daily Session Note  Patient Details  Name: Caleb Butler MRN: [id number] Date of Birth: 25-Feb-1946 Referring Provider:   Flowsheet Row PULMONARY REHAB OTHER RESP ORIENTATION from 07/04/2023 in [facility] CARDIAC REHABILITATION  Referring Provider Dina Rich MD  [facility] Pulmonologist: Dr. Isaiah Serge       Encounter Date: 09/05/2023  Check In:  Session Check In - 09/05/23 1051       Check-In   Supervising physician immediately available to respond to emergencies See telemetry face sheet for immediately available MD    Location [facility]    Staff Present Fabio Pierce, MA, RCEP, CCRP, CCET;[place] Roseanne [place], BSN, RN, WTA-C    Virtual Visit No    Medication changes reported     No    Fall or balance concerns reported    No    Tobacco Cessation No Change    Warm-up and Cool-down Performed on first and last piece of equipment    Resistance Training Performed Yes    VAD Patient? No    PAD/SET Patient? No      Pain Assessment   Currently in Pain? No/denies             Capillary Blood Glucose: No results found for this or any previous visit (from the past 24 hours).    Social History   Tobacco Use  Smoking Status Never  Smokeless Tobacco Never    Goals Met:  Proper associated with RPD/PD & O2 Sat Independence with exercise equipment Improved SOB with ADL's Using PLB without cueing & demonstrates good technique Exercise tolerated well No report of concerns or symptoms today Strength training completed today  Goals Unmet:  Not Applicable  Comments: Pt able to follow exercise prescription today without complaint.  Will continue to monitor for progression.

## 2023-09-07 ENCOUNTER — Encounter (HOSPITAL_COMMUNITY)
Admission: RE | Admit: 2023-09-07 | Discharge: 2023-09-07 | Disposition: A | Discharge status: Home / Self Care | Payer: No Typology Code available for payment source | Source: Ambulatory Visit | Attending: Cardiology | Admitting: Cardiology

## 2023-09-07 DIAGNOSIS — J849 Interstitial pulmonary disease, unspecified: Secondary | ICD-10-CM

## 2023-09-07 NOTE — Progress Notes (Signed)
 Daily Session Note  Patient Details  Name: Caleb Butler MRN: [id number] Date of Birth: 1946/01/23 Referring Provider:   Flowsheet Row PULMONARY REHAB OTHER RESP ORIENTATION from 07/04/2023 in [facility] CARDIAC REHABILITATION  Referring Provider Dina Rich MD  [facility] Pulmonologist: Dr. Isaiah Serge       Encounter Date: 09/07/2023  Check In:  Session Check In - 09/07/23 1015       Check-In   Supervising physician immediately available to respond to emergencies See telemetry face sheet for immediately available MD    Location [facility]    Staff Present Rodena Medin, RN, BSN;Heather Fredric Mare, BS, Exercise Physiologist    Virtual Visit No    Medication changes reported     No    Fall or balance concerns reported    No    Warm-up and Cool-down Performed on first and last piece of equipment    Resistance Training Performed Yes    VAD Patient? No    PAD/SET Patient? No      Pain Assessment   Currently in Pain? No/denies    Multiple Pain Sites No             Capillary Blood Glucose: No results found for this or any previous visit (from the past 24 hours).    Social History   Tobacco Use  Smoking Status Never  Smokeless Tobacco Never    Goals Met:  Proper associated with RPD/PD & O2 Sat Independence with exercise equipment Using PLB without cueing & demonstrates good technique Exercise tolerated well No report of concerns or symptoms today Strength training completed today  Goals Unmet:  Not Applicable  Comments: Pt able to follow exercise prescription today without complaint.  Will continue to monitor for progression.

## 2023-09-12 ENCOUNTER — Encounter (HOSPITAL_COMMUNITY)
Admission: RE | Admit: 2023-09-12 | Discharge: 2023-09-12 | Disposition: A | Discharge status: Home / Self Care | Payer: No Typology Code available for payment source | Source: Ambulatory Visit | Attending: Cardiology | Admitting: Cardiology

## 2023-09-12 DIAGNOSIS — J849 Interstitial pulmonary disease, unspecified: Secondary | ICD-10-CM

## 2023-09-12 DIAGNOSIS — R0602 Shortness of breath: Secondary | ICD-10-CM

## 2023-09-12 NOTE — Progress Notes (Signed)
 Daily Session Note  Patient Details  Name: Caleb Butler MRN: [id number] Date of Birth: Nov 19, 1945 Referring Provider:   Flowsheet Row PULMONARY REHAB OTHER RESP ORIENTATION from 07/04/2023 in [facility] CARDIAC REHABILITATION  Referring Provider Armida Lander MD  [facility] Pulmonologist: Dr. Waylan Haggard       Encounter Date: 09/12/2023  Check In:  Session Check In - 09/12/23 1030       Check-In   Supervising physician immediately available to respond to emergencies See telemetry face sheet for immediately available MD    Location [facility]    Staff Present Clotilda Danish, BS, Exercise Physiologist;Laureen Bevin [place], BS, RRT, CPFT;Brittany Foley, BSN, RN    Virtual Visit No    Medication changes reported     No    Fall or balance concerns reported    No    Tobacco Cessation No Change    Warm-up and Cool-down Performed on first and last piece of equipment    Resistance Training Performed Yes    VAD Patient? No    PAD/SET Patient? No      Pain Assessment   Currently in Pain? No/denies    Multiple Pain Sites No             Capillary Blood Glucose: No results found for this or any previous visit (from the past 24 hours).    Social History   Tobacco Use  Smoking Status Never  Smokeless Tobacco Never    Goals Met:  Independence with exercise equipment Exercise tolerated well No report of concerns or symptoms today Strength training completed today  Goals Unmet:  Not Applicable  Comments: Pt able to follow exercise prescription today without complaint.  Will continue to monitor for progression.

## 2023-09-14 ENCOUNTER — Encounter (HOSPITAL_COMMUNITY)
Admission: RE | Admit: 2023-09-14 | Discharge: 2023-09-14 | Disposition: A | Discharge status: Home / Self Care | Payer: No Typology Code available for payment source | Source: Ambulatory Visit | Attending: Cardiology | Admitting: Cardiology

## 2023-09-14 DIAGNOSIS — J849 Interstitial pulmonary disease, unspecified: Secondary | ICD-10-CM | POA: Diagnosis not present

## 2023-09-14 NOTE — Progress Notes (Signed)
 Daily Session Note  Patient Details  Name: Tyland Klemens MRN: [id number] Date of Birth: [birth year] Referring Provider:   Flowsheet Row PULMONARY REHAB OTHER RESP ORIENTATION from 07/04/2023 in [facility] CARDIAC REH[facility]ILITATION  Referring Provider Armida Lander MD  [facility] Pulmonologist: Dr. Waylan Haggard       Encounter Date: 09/14/2023  Check In:  Session Check In - 09/14/23 1015       Check-In   Supervising physician immediately available to respond to emergencies See telemetry face sheet for immediately available MD    Location [facility]    Staff Present Almeda Aris, RN;Massa Pe Lincoln Renshaw, RN, BSN;Heather Toy Freund, BS, Exercise Physiologist;Other   Macario Savin, RN   Virtual Visit No    Medication changes reported     No    Fall or balance concerns reported    No    Warm-up and Cool-down Performed on first and last piece of equipment    Resistance Training Performed Yes    VAD Patient? No    PAD/SET Patient? No      Pain Assessment   Currently in Pain? No/denies    Multiple Pain Sites No             Capillary Blood Glucose: No results found for this or any previous visit (from the past 24 hours).    Social History   Tobacco Use  Smoking Status Never  Smokeless Tobacco Never    Goals Met:  Proper associated with RPD/PD & O2 Sat Independence with exercise equipment Using PLB without cueing & demonstrates good technique Exercise tolerated well No report of concerns or symptoms today Strength training completed today  Goals Unmet:  Not Applicable  Comments: Pt able to follow exercise prescription today without complaint.  Will continue to monitor for progression.

## 2023-09-19 ENCOUNTER — Encounter (HOSPITAL_COMMUNITY): Payer: No Typology Code available for payment source

## 2023-09-20 ENCOUNTER — Encounter (HOSPITAL_COMMUNITY): Payer: Self-pay | Admitting: *Deleted

## 2023-09-20 DIAGNOSIS — R0602 Shortness of breath: Secondary | ICD-10-CM

## 2023-09-20 DIAGNOSIS — J849 Interstitial pulmonary disease, unspecified: Secondary | ICD-10-CM

## 2023-09-20 NOTE — Progress Notes (Signed)
 Pulmonary Individual Treatment Plan  Patient Details  Name: Caleb Butler MRN: [id number] Date of Birth: Dec 29, 1945 Referring Provider:   Flowsheet Row PULMONARY REHAB OTHER RESP ORIENTATION from 07/04/2023 in [facility] CARDIAC REHABILITATION  Referring Provider Armida Lander MD  [Primary Pulmonologist: Dr. Waylan Haggard       Initial Encounter Date:  Flowsheet Row PULMONARY REHAB OTHER RESP ORIENTATION from 07/04/2023 in [facility] CARDIAC REHABILITATION  Date 07/04/23       Visit Diagnosis: ILD (interstitial lung disease) (HCC)  SOB (shortness of breath)  Patient's Home Medications on Admission:   Current Outpatient Medications:    Accu-Chek FastClix Lancets MISC, USE TO CHECK BLOOD SUGAR TWICE DAILY E11.9, Disp: 102 each, Rfl: 12   acetaminophen  (TYLENOL ) 500 MG tablet, Take 500-1,000 mg by mouth daily as needed for moderate pain or headache., Disp: , Rfl:    aspirin  81 MG EC tablet, Take 81 mg by mouth at bedtime. , Disp: , Rfl:    blood glucose meter kit and supplies, Dispense based on patient and insurance preference. Use up to four times daily as directed. (FOR ICD-10 E10.9, E11.9)., Disp: 1 each, Rfl: 0   Blood Glucose Monitoring Suppl DEVI, 1 each by Does not apply route in the morning, at noon, and at bedtime. May substitute to any manufacturer covered by patient's insurance. Needs Onetouch by [employer], Disp: 1 each, Rfl: 0   Capsaicin 0.1 % CREA, Apply topically as needed., Disp: , Rfl:    clopidogrel  (PLAVIX ) 75 MG tablet, TAKE 1 TABLET BY MOUTH EVERY DAY, Disp: 90 tablet, Rfl: 0   famotidine (PEPCID) 40 MG tablet, Take 40 mg by mouth daily., Disp: , Rfl:    levothyroxine  (SYNTHROID ) 75 MCG tablet, Take 1 tablet (75 mcg total) by mouth daily., Disp: 90 tablet, Rfl: 3   mometasone  (ELOCON ) 0.1 % ointment, Apply topically daily., Disp: 45 g, Rfl: 0   Multiple Vitamin (MULTIVITAMIN WITH MINERALS) TABS tablet, Take 1 tablet by mouth daily., Disp: , Rfl:    rosuvastatin   (CRESTOR ) 20 MG tablet, TAKE 1 TABLET BY MOUTH EVERY DAY, Disp: 90 tablet, Rfl: 3   Semaglutide ,0.25 or 0.5MG /DOS, (OZEMPIC , 0.25 OR 0.5 MG/DOSE,) 2 MG/1.5ML SOPN, Inject 0.5 mg into the skin once a week., Disp: 1.5 mL, Rfl: 3   Tiotropium Bromide-Olodaterol (STIOLTO RESPIMAT ) 2.5-2.5 MCG/ACT AERS, Inhale 2 puffs into the lungs daily., Disp: 1 each, Rfl: 5   traZODone  (DESYREL ) 50 MG tablet, Take 1 tablet (50 mg total) by mouth at bedtime. (Patient not taking: Reported on 07/31/2023), Disp: 90 tablet, Rfl: 3   vitamin B-12 (CYANOCOBALAMIN) 1000 MCG tablet, Take 1,000 mcg by mouth daily., Disp: , Rfl:   Past Medical History: Past Medical History:  Diagnosis Date   Anxiety    Aortic aneurysm (HCC)    dilated aortic root on echo 2024   Arthritis    "my back is loaded w/it" (01/06/2017)   CAD (coronary artery disease)    80% ostial lesion in L circumflex (2017)   Chronic lower back pain    GERD (gastroesophageal reflux disease)    Hiatal hernia    High cholesterol    Hypothyroidism    IBS (irritable bowel syndrome)    Numbness and tingling of right lower extremity    "since 11/30/2016" (01/06/2017)   PMR (polymyalgia rheumatica) (HCC)    Stroke (HCC)    Type 2 diabetes mellitus (HCC)     Tobacco Use: Social History   Tobacco Use  Smoking Status Never  Smokeless  Tobacco Never    Labs: Review Flowsheet  More data exists      Latest Ref Rng & Units 08/12/2019 02/20/2020 02/24/2022 04/04/2023 07/31/2023  Labs for ITP Cardiac and Pulmonary Rehab  Cholestrol <200 mg/dL 161  - 096  045  409   LDL (calc) mg/dL (calc) 48  - 45  35  46   HDL-C > OR = 40 mg/dL 40  - 45  40  47   Trlycerides <150 mg/dL 811  - 914  782  956   Hemoglobin A1c <5.7 % of total Hgb 6.6  7.5  6.8  6.8  7.7     Capillary Blood Glucose: Lab Results  Component Value Date   GLUCAP 171 (H) 07/13/2023   GLUCAP 225 (H) 07/06/2023   GLUCAP 111 (H) 03/13/2018   GLUCAP 167 (H) 01/07/2017   GLUCAP 178 (H) 01/06/2017      Pulmonary Assessment Scores:  Pulmonary Assessment Scores     Row Name 07/04/23 1413         ADL UCSD   ADL Phase Entry     SOB Score total 25     Rest 0     Walk 1     Stairs 3     Bath 0     Dress 1     Shop 1       CAT Score   CAT Score 16       mMRC Score   mMRC Score 2             UCSD: Self-administered rating of dyspnea associated with activities of daily living (ADLs) 6-point scale (0 = "not at all" to 5 = "maximal or unable to do because of breathlessness")  Scoring Scores range from 0 to 120.  Minimally important difference is 5 units  CAT: CAT can identify the health impairment of COPD patients and is better correlated with disease progression.  CAT has a scoring range of zero to 40. The CAT score is classified into four groups of low (less than 10), medium (10 - 20), high (21-30) and very high (31-40) based on the impact level of disease on health status. A CAT score over 10 suggests significant symptoms.  A worsening CAT score could be explained by an exacerbation, poor medication adherence, poor inhaler technique, or progression of COPD or comorbid conditions.  CAT MCID is 2 points  mMRC: mMRC (Modified Medical Research Council) Dyspnea Scale is used to assess the degree of baseline functional disability in patients of respiratory disease due to dyspnea. No minimal important difference is established. A decrease in score of 1 point or greater is considered a positive change.   Pulmonary Function Assessment:  Pulmonary Function Assessment - 07/04/23 1413       Breath   Shortness of Breath Yes;Limiting activity;Fear of Shortness of Breath             Exercise Target Goals: Exercise Program Goal: Individual exercise prescription set using results from initial 6 min walk test and THRR while considering  patient's activity barriers and safety.   Exercise Prescription Goal: Initial exercise prescription builds to 30-45 minutes a day of  aerobic activity, 2-3 days per week.  Home exercise guidelines will be given to patient during program as part of exercise prescription that the participant will acknowledge.  Activity Barriers & Risk Stratification:  Activity Barriers & Cardiac Risk Stratification - 07/04/23 0824       Activity Barriers & Cardiac Risk Stratification  Activity Barriers Joint Problems;Arthritis;Deconditioning;History of Falls;Muscular Weakness;Back Problems;Shortness of Breath   disc compression, chronic back pain uses horse linament, r leg numbness            6 Minute Walk:  6 Minute Walk     Row Name 07/04/23 1052         6 Minute Walk   Phase Initial     Distance 1140 feet     Walk Time 6 minutes     # of Rest Breaks 0     MPH 2.16     METS 2.14     RPE 11     Perceived Dyspnea  1     VO2 Peak 7.49     Symptoms Yes (comment)     Comments SOB     Resting HR 78 bpm     Resting BP 116/62     Resting Oxygen Saturation  96 %     Exercise Oxygen Saturation  during 6 min walk 95 %     Max Ex. HR 89 bpm     Max Ex. BP 126/64     2 Minute Post BP 118/62       Interval HR   1 Minute HR 86     2 Minute HR 88     3 Minute HR 87     4 Minute HR 88     5 Minute HR 79     6 Minute HR 89     2 Minute Post HR 81     Interval Heart Rate? Yes       Interval Oxygen   Interval Oxygen? Yes     Baseline Oxygen Saturation % 96 %     1 Minute Oxygen Saturation % 95 %     1 Minute Liters of Oxygen 0 L  Room Air     2 Minute Oxygen Saturation % 95 %     2 Minute Liters of Oxygen 0 L     3 Minute Oxygen Saturation % 95 %     3 Minute Liters of Oxygen 0 L     4 Minute Oxygen Saturation % 96 %     4 Minute Liters of Oxygen 0 L     5 Minute Oxygen Saturation % 95 %     5 Minute Liters of Oxygen 0 L     6 Minute Oxygen Saturation % 96 %     6 Minute Liters of Oxygen 0 L     2 Minute Post Oxygen Saturation % 96 %     2 Minute Post Liters of Oxygen 0 L              Oxygen Initial  Assessment:  Oxygen Initial Assessment - 07/04/23 0827       Home Oxygen   Home Oxygen Device None    Sleep Oxygen Prescription None    Home Exercise Oxygen Prescription None    Home Resting Oxygen Prescription None      Initial 6 min Walk   Oxygen Used None      Program Oxygen Prescription   Program Oxygen Prescription None      Intervention   Short Term Goals To learn and understand importance of monitoring SPO2 with pulse oximeter and demonstrate accurate use of the pulse oximeter.;To learn and understand importance of maintaining oxygen saturations>88%;To learn and demonstrate proper pursed lip breathing techniques or other breathing techniques. ;To learn and demonstrate proper use of respiratory medications  Long  Term Goals Maintenance of O2 saturations>88%;Compliance with respiratory medication;Verbalizes importance of monitoring SPO2 with pulse oximeter and return demonstration;Exhibits proper breathing techniques, such as pursed lip breathing or other method taught during program session;Demonstrates proper use of MDI's             Oxygen Re-Evaluation:  Oxygen Re-Evaluation     Row Name 08/29/23 1114             Program Oxygen Prescription   Program Oxygen Prescription None         Home Oxygen   Home Oxygen Device None       Sleep Oxygen Prescription None       Home Exercise Oxygen Prescription None       Home Resting Oxygen Prescription None         Goals/Expected Outcomes   Short Term Goals To learn and understand importance of monitoring SPO2 with pulse oximeter and demonstrate accurate use of the pulse oximeter.;To learn and understand importance of maintaining oxygen saturations>88%;To learn and demonstrate proper pursed lip breathing techniques or other breathing techniques. ;To learn and demonstrate proper use of respiratory medications       Long  Term Goals Maintenance of O2 saturations>88%;Compliance with respiratory medication;Verbalizes importance  of monitoring SPO2 with pulse oximeter and return demonstration;Exhibits proper breathing techniques, such as pursed lip breathing or other method taught during program session;Demonstrates proper use of MDI's       Comments mike is doing well in rehab. 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 His oxygen levels during exercise has been staying in the upper 90s.       Goals/Expected Outcomes contnmue to exercise at home and attend rehab and working on PLB                Oxygen Discharge (Final Oxygen Re-Evaluation):  Oxygen Re-Evaluation - 08/29/23 1114       Program Oxygen Prescription   Program Oxygen Prescription None      Home Oxygen   Home Oxygen Device None    Sleep Oxygen Prescription None    Home Exercise Oxygen Prescription None    Home Resting Oxygen Prescription None      Goals/Expected Outcomes   Short Term Goals To learn and understand importance of monitoring SPO2 with pulse oximeter and demonstrate accurate use of the pulse oximeter.;To learn and understand importance of maintaining oxygen saturations>88%;To learn and demonstrate proper pursed lip breathing techniques or other breathing techniques. ;To learn and demonstrate proper use of respiratory medications    Long  Term Goals Maintenance of O2 saturations>88%;Compliance with respiratory medication;Verbalizes importance of monitoring SPO2 with pulse oximeter and return demonstration;Exhibits proper breathing techniques, such as pursed lip breathing or other method taught during program session;Demonstrates proper use of MDI's    Comments mike is doing well in rehab. 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 His oxygen levels during exercise has been staying in the upper 90s.    Goals/Expected Outcomes contnmue to exercise at home and attend rehab and  working on PLB             Initial Exercise Prescription:  Initial Exercise Prescription - 07/04/23 1100       Date of Initial Exercise RX and Referring Provider   Date 07/04/23  Referring Provider Armida Lander MD   Primary Pulmonologist: Dr. Waylan Haggard     Oxygen   Maintain Oxygen Saturation 88% or higher      Treadmill   MPH 2    Grade 0.5    Minutes 15    METs 2.67      REL-XR   Level 2    Speed 50    Minutes 15    METs 2.5      Prescription Details   Frequency (times per week) 2    Duration Progress to 30 minutes of continuous aerobic without signs/symptoms of physical distress      Intensity   THRR 40-80% of Max Heartrate 104-130    Ratings of Perceived Exertion 11-13    Perceived Dyspnea 0-4      Progression   Progression Continue to progress workloads to maintain intensity without signs/symptoms of physical distress.      Resistance Training   Training Prescription Yes    Weight 4 lb    Reps 10-15             Perform Capillary Blood Glucose checks as needed.  Exercise Prescription Changes:   Exercise Prescription Changes     Row Name 07/04/23 1100 07/11/23 1200 07/20/23 1200 08/08/23 1200 08/15/23 1500     Response to Exercise   Blood Pressure (Admit) 116/62 118/60 110/60 112/62 112/70   Blood Pressure (Exercise) 126/64 118/62 112/62 -- --   Blood Pressure (Exit) 112/66 108/68 106/60 110/60 110/60   Heart Rate (Admit) 78 bpm 78 bpm 63 bpm 61 bpm 68 bpm   Heart Rate (Exercise) 89 bpm 103 bpm 86 bpm 100 bpm 85 bpm   Heart Rate (Exit) 84 bpm 85 bpm 76 bpm 81 bpm 81 bpm   Oxygen Saturation (Admit) 96 % 96 % 99 % 97 % 99 %   Oxygen Saturation (Exercise) 95 % 93 % 96 % 96 % 97 %   Oxygen Saturation (Exit) 93 % 97 % 96 % 97 % 98 %   Rating of Perceived Exertion (Exercise) 11 15 12 13 12    Perceived Dyspnea (Exercise) 1 3 3 3 2    Symptoms SOb -- -- -- --   Comments walk test results -- -- -- --   Duration -- Continue with 30 min of aerobic  exercise without signs/symptoms of physical distress. Continue with 30 min of aerobic exercise without signs/symptoms of physical distress. Continue with 30 min of aerobic exercise without signs/symptoms of physical distress. Continue with 30 min of aerobic exercise without signs/symptoms of physical distress.   Intensity -- THRR unchanged THRR unchanged THRR unchanged THRR unchanged     Progression   Progression -- Continue to progress workloads to maintain intensity without signs/symptoms of physical distress. Continue to progress workloads to maintain intensity without signs/symptoms of physical distress. Continue to progress workloads to maintain intensity without signs/symptoms of physical distress. Continue to progress workloads to maintain intensity without signs/symptoms of physical distress.     Resistance Training   Training Prescription -- Yes Yes Yes Yes   Weight -- 4 4 4 4    Reps -- 10-15 10-15 10-15 10-15     Treadmill   MPH -- 2.5 2.5 3 2.8   Grade -- 0.5 1.5 2 1.5   Minutes -- 15 15 15 15    METs -- 3.09 3.43 4.12 3.72     REL-XR   Level -- 2 2 2 2    Speed -- 83 61 62 68  Minutes -- 15 15 15 15    METs -- 4.7 4.6 3.6 5     Oxygen   Maintain Oxygen Saturation -- -- 88% or higher 88% or higher 88% or higher    Row Name 09/05/23 1500             Response to Exercise   Blood Pressure (Admit) 112/70       Blood Pressure (Exit) 116/90       Heart Rate (Admit) 58 bpm       Heart Rate (Exercise) 81 bpm       Heart Rate (Exit) 69 bpm       Oxygen Saturation (Admit) 99 %       Oxygen Saturation (Exercise) 95 %       Oxygen Saturation (Exit) 96 %       Rating of Perceived Exertion (Exercise) 13       Perceived Dyspnea (Exercise) 3       Duration Continue with 30 min of aerobic exercise without signs/symptoms of physical distress.       Intensity THRR unchanged         Progression   Progression Continue to progress workloads to maintain intensity without  signs/symptoms of physical distress.         Resistance Training   Training Prescription Yes       Weight 5       Reps 10-15         Treadmill   MPH 2.5       Grade 2       Minutes 15       METs 3.6         REL-XR   Level 5       Speed 66       Minutes 15       METs 0.4         Oxygen   Maintain Oxygen Saturation 88% or higher                Exercise Comments:   Exercise Goals and Review:   Exercise Goals     Row Name 07/04/23 1103             Exercise Goals   Increase Physical Activity Yes       Intervention Provide advice, education, support and counseling about physical activity/exercise needs.;Develop an individualized exercise prescription for aerobic and resistive training based on initial evaluation findings, risk stratification, comorbidities and participant's personal goals.       Expected Outcomes Short Term: Attend rehab on a regular basis to increase amount of physical activity.;Long Term: Add in home exercise to make exercise part of routine and to increase amount of physical activity.;Long Term: Exercising regularly at least 3-5 days a week.       Increase Strength and Stamina Yes       Intervention Provide advice, education, support and counseling about physical activity/exercise needs.;Develop an individualized exercise prescription for aerobic and resistive training based on initial evaluation findings, risk stratification, comorbidities and participant's personal goals.       Expected Outcomes Short Term: Increase workloads from initial exercise prescription for resistance, speed, and METs.;Short Term: Perform resistance training exercises routinely during rehab and add in resistance training at home;Long Term: Improve cardiorespiratory fitness, muscular endurance and strength as measured by increased METs and functional capacity ( )       Able to understand and use rate of perceived exertion (RPE) scale Yes  Intervention Provide education  and explanation on how to use RPE scale       Expected Outcomes Short Term: Able to use RPE daily in rehab to express subjective intensity level;Long Term:  Able to use RPE to guide intensity level when exercising independently       Able to understand and use Dyspnea scale Yes       Intervention Provide education and explanation on how to use Dyspnea scale       Expected Outcomes Short Term: Able to use Dyspnea scale daily in rehab to express subjective sense of shortness of breath during exertion;Long Term: Able to use Dyspnea scale to guide intensity level when exercising independently       Knowledge and understanding of Target Heart Rate Range (THRR) Yes       Intervention Provide education and explanation of THRR including how the numbers were predicted and where they are located for reference       Expected Outcomes Short Term: Able to state/look up THRR;Short Term: Able to use daily as guideline for intensity in rehab;Long Term: Able to use THRR to govern intensity when exercising independently       Able to check pulse independently Yes       Intervention Provide education and demonstration on how to check pulse in carotid and radial arteries.;Review the importance of being able to check your own pulse for safety during independent exercise       Expected Outcomes Short Term: Able to explain why pulse checking is important during independent exercise;Long Term: Able to check pulse independently and accurately       Understanding of Exercise Prescription Yes       Intervention Provide education, explanation, and written materials on patient's individual exercise prescription       Expected Outcomes Short Term: Able to explain program exercise prescription;Long Term: Able to explain home exercise prescription to exercise independently                Exercise Goals Re-Evaluation :  Exercise Goals Re-Evaluation     Row Name 07/11/23 1251 07/24/23 0823 08/04/23 0748 08/09/23 0806 08/23/23  1345     Exercise Goal Re-Evaluation   Exercise Goals Review Increase Physical Activity;Increase Strength and Stamina;Understanding of Exercise Prescription Increase Physical Activity;Increase Strength and Stamina;Understanding of Exercise Prescription Increase Physical Activity;Increase Strength and Stamina;Understanding of Exercise Prescription Increase Physical Activity;Increase Strength and Stamina;Understanding of Exercise Prescription Increase Physical Activity;Increase Strength and Stamina;Understanding of Exercise Prescription   Comments Bambi Lever is doing well in rehab and is tolerating exercise well. He is SOB when he gets off the treadmill. He is only on his 3rd visit. Will continue to montior and progress as able. Bambi Lever is doing well in rehab and is tolerating exercise well. He is SOB when he gets off the treadmill. Will continue to montior and progress as able. Bambi Lever is doing well in rehab. 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 He is enjoying the program and getting to talk to everyone in class. Athena Bland is doing well in rehab and is on his 11th visit. He has been increasing his speed on the treadmill to 3.0 with a grade of 2.0. Will continue to monitor and progress as able. Athena Bland is doing well in rehab and is pushing himself. He is keeping his levels on his equipment and has not moved  up in the past weeks. Will contiue to monitor and progress asable   Expected Outcomes Continue to attend rehab Continue to attend rehab Continue to attend rehab and exercise outside of class with continues walking continue to attend rehab continue to attend rehab    Row Name 08/29/23 1054             Exercise Goal Re-Evaluation   Exercise Goals Review Increase Physical Activity;Increase Strength and Stamina;Understanding of Exercise Prescription       Comments Athena Bland is doing well in rehab and is pushing  himslef. He has noticed that he seems to be more in shape but has not notcied his breathing improving. 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 He hasnt needed to stop to take breaks as much.       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   continue to attend rehab                Discharge Exercise Prescription (Final Exercise Prescription Changes):  Exercise Prescription Changes - 09/05/23 1500       Response to Exercise   Blood Pressure (Admit) 112/70    Blood Pressure (Exit) 116/90    Heart Rate (Admit) 58 bpm    Heart Rate (Exercise) 81 bpm    Heart Rate (Exit) 69 bpm    Oxygen Saturation (Admit) 99 %    Oxygen Saturation (Exercise) 95 %    Oxygen Saturation (Exit) 96 %    Rating of Perceived Exertion (Exercise) 13    Perceived Dyspnea (Exercise) 3    Duration Continue with 30 min of aerobic exercise without signs/symptoms of physical distress.    Intensity THRR unchanged      Progression   Progression Continue to progress workloads to maintain intensity without signs/symptoms of physical distress.      Resistance Training   Training Prescription Yes    Weight 5    Reps 10-15      Treadmill   MPH 2.5    Grade 2    Minutes 15    METs 3.6      REL-XR   Level 5    Speed 66    Minutes 15    METs 0.4      Oxygen   Maintain Oxygen Saturation 88% or higher             Nutrition:  Target Goals: Understanding of nutrition guidelines, daily intake of sodium 1500mg , cholesterol 200mg , calories 30% from fat and 7% or less from saturated fats, daily to have 5 or more servings of fruits and vegetables.  Biometrics:  Pre Biometrics - 07/04/23 1104       Pre Biometrics   Height 5\' 8"  (1.727 m)    Weight 178 lb 8 oz (81 kg)    Waist Circumference 38.5 inches    Hip Circumference 38.5 inches    Waist to Hip Ratio 1 %    BMI (Calculated) 27.15    Grip Strength 26.8 kg    Single  Leg Stand 30 seconds              Nutrition Therapy Plan and Nutrition Goals:  Nutrition Therapy & Goals - 07/04/23 1407       Intervention Plan   Intervention Prescribe, educate and counsel regarding individualized specific dietary modifications aiming towards targeted core components such as weight, hypertension, lipid management, diabetes, heart  failure and other comorbidities.;Nutrition handout(s) given to patient.    Expected Outcomes Short Term Goal: Understand basic principles of dietary content, such as calories, fat, sodium, cholesterol and nutrients.;Long Term Goal: Adherence to prescribed nutrition plan.             Nutrition Assessments:  MEDIFICTS Score Key: >=70 Need to make dietary changes  40-70 Heart Healthy Diet <= 40 Therapeutic Level Cholesterol Diet  Flowsheet Row PULMONARY REHAB OTHER RESP ORIENTATION from 07/04/2023 in [facility] CARDIAC REHABILITATION  Picture Your Plate Total Score on Admission 56      Picture Your Plate Scores: <40 Unhealthy dietary pattern with much room for improvement. 41-50 Dietary pattern unlikely to meet recommendations for good health and room for improvement. 51-60 More healthful dietary pattern, with some room for improvement.  >60 Healthy dietary pattern, although there may be some specific behaviors that could be improved.    Nutrition Goals Re-Evaluation:  Nutrition Goals Re-Evaluation     Row Name 08/04/23 0750 08/29/23 1103           Goals   Nutrition Goal Healthy eating Healthy eating      Comment Athena Bland is doing well in rehab. He has been cutting out sweets and watching what he eats. He is on ozempic  so he is trying to eat healthier and watching portion control/ Athena Bland is doing well in rehab. He said his niurtion has not been welll lately. He is on ozempic  and stated that he just hasnt been hungery and not wanting to eat. He will eat breakfest and dinner with a protein shake for lunch. He is trying to drink more  water during the day but is having a hard time.      Expected Outcome Continue to eat healthy and work on cutting out sweets and watching portion control Continue to try to drink water throughtout the day  try to eat healthy meals and find foods he lieks to eat               Nutrition Goals Discharge (Final Nutrition Goals Re-Evaluation):  Nutrition Goals Re-Evaluation - 08/29/23 1103       Goals   Nutrition Goal Healthy eating    Comment Athena Bland is doing well in rehab. He said his niurtion has not been welll lately. He is on ozempic  and stated that he just hasnt been hungery and not wanting to eat. He will eat breakfest and dinner with a protein shake for lunch. He is trying to drink more water during the day but is having a hard time.    Expected Outcome Continue to try to drink water throughtout the day  try to eat healthy meals and find foods he lieks to eat             Psychosocial: Target Goals: Acknowledge presence or absence of significant depression and/or stress, maximize coping skills, provide positive support system. Participant is able to verbalize types and ability to use techniques and skills needed for reducing stress and depression.  Initial Review & Psychosocial Screening:  Initial Psych Review & Screening - 07/04/23 9811       Initial Review   Current issues with Current Stress Concerns;Current Sleep Concerns    Source of Stress Concerns Chronic Illness;Unable to participate in former interests or hobbies;Unable to perform yard/household activities    Comments new puppy, had to put previous dog down after a stroke, history not sleeping well and up to bathroom (non alcholic beer helps too), breathing limits  his activity any time he does anythign      Family Dynamics   Good Support System? Yes   wife (nurse), family lives in [place]     Barriers   Psychosocial barriers to participate in program The patient should benefit from training in stress management and  relaxation.;Psychosocial barriers identified (see note)      Screening Interventions   Interventions Encouraged to exercise;Provide feedback about the scores to participant;To provide support and resources with identified psychosocial needs    Expected Outcomes Short Term goal: Utilizing psychosocial counselor, staff and physician to assist with identification of specific Stressors or current issues interfering with healing process. Setting desired goal for each stressor or current issue identified.;Long Term Goal: Stressors or current issues are controlled or eliminated.;Short Term goal: Identification and review with participant of any Quality of Life or Depression concerns found by scoring the questionnaire.;Long Term goal: The participant improves quality of Life and PHQ9 Scores as seen by post scores and/or verbalization of changes             Quality of Life Scores:  Scores of 19 and below usually indicate a poorer quality of life in these areas.  A difference of  2-3 points is a clinically meaningful difference.  A difference of 2-3 points in the total score of the Quality of Life Index has been associated with significant improvement in overall quality of life, self-image, physical symptoms, and general health in studies assessing change in quality of life.   PHQ-9: Review Flowsheet  More data exists      08/03/2023 07/04/2023 05/20/2022 03/25/2022 02/24/2022  Depression screen PHQ 2/9  Decreased Interest 2 1 0 0 0  Down, Depressed, Hopeless 0 0 0 0 0  PHQ - 2 Score 2 1 0 0 0  Altered sleeping 0 1 - - -  Tired, decreased energy 2 2 - - -  Change in appetite 1 2 - - -  Feeling bad or failure about yourself  0 0 - - -  Trouble concentrating 1 1 - - -  Moving slowly or fidgety/restless 0 0 - - -  Suicidal thoughts 0 0 - - -  PHQ-9 Score 6 7 - - -  Difficult doing work/chores Somewhat difficult - - - -   Interpretation of Total Score  Total Score Depression Severity:  1-4 =  Minimal depression, 5-9 = Mild depression, 10-14 = Moderate depression, 15-19 = Moderately severe depression, 20-27 = Severe depression   Psychosocial Evaluation and Intervention:  Psychosocial Evaluation - 07/04/23 1106       Psychosocial Evaluation & Interventions   Interventions Relaxation education;Encouraged to exercise with the program and follow exercise prescription;Stress management education    Comments Bambi Lever is coming into pulmonary rehab for ILD.  He also has heart history.  He has done the program previously for both cardiac and pulmonary and is looking forward to building up his stamina and being able to breathe better again.  He lives at home with his wife and they rest of their family is in [place]. [place] with horses, dogs, cats, chickens, and a donkey.  He does not precieve any barriers to attending rehab.  He is eager to get going and be able to not have to stop as much. He did lose his dog a few weeks ago and then his wife found a new [place] puppy.  So they now have a new puppy to train and she has been scratching  his arms up.    Expected Outcomes Short: Attend rehab to build stamina Long: Conitnue to enjoy puppy    Continue Psychosocial Services  Follow up required by staff             Psychosocial Re-Evaluation:  Psychosocial Re-Evaluation     Row Name 08/03/23 1106 08/04/23 0750 08/29/23 1056         Psychosocial Re-Evaluation   Current issues with Current Depression;Current Sleep Concerns;Current Stress Concerns Current Depression;Current Sleep Concerns;Current Stress Concerns Current Depression;Current Sleep Concerns;Current Stress Concerns     Comments Reviewed patient health questionnaire (PHQ-9) with patient for follow up. Previously, patients score indicated signs/symptoms of depression.  Reviewed to see if patient is improving symptom wise while in program.  Score improved and patient states that it is because they have been getting  out and feeling better with exercise.  He continues to struggle with sleep which leaves him tired at night. He is on trazadone, but only works some.  He is also having some vision disturbances that have him feeling dizzy and unsure.  He is meeting with his doctor about it next week. -- He is doing well in rehab. He has had some stress due to his aniamls and finding one dead and one missing not know what happend. He stated that he still does not sleep well. He is not doing anything for his sleep.     Expected Outcomes Short; Talk to doctor Long: Continue to exericse to help with sleep. -- talk to doctor about sleep sicne he is not sleeping well     Interventions Encouraged to attend Pulmonary Rehabilitation for the exercise;Stress management education;Relaxation education -- Encouraged to attend Pulmonary Rehabilitation for the exercise;Stress management education;Relaxation education     Continue Psychosocial Services  Follow up required by staff -- Follow up required by staff              Psychosocial Discharge (Final Psychosocial Re-Evaluation):  Psychosocial Re-Evaluation - 08/29/23 1056       Psychosocial Re-Evaluation   Current issues with Current Depression;Current Sleep Concerns;Current Stress Concerns    Comments He is doing well in rehab. He has had some stress due to his aniamls and finding one dead and one missing not know what happend. He stated that he still does not sleep well. He is not doing anything for his sleep.    Expected Outcomes talk to doctor about sleep sicne he is not sleeping well    Interventions Encouraged to attend Pulmonary Rehabilitation for the exercise;Stress management education;Relaxation education    Continue Psychosocial Services  Follow up required by staff              Education: Education Goals: Education classes will be provided on a weekly basis, covering required topics. Participant will state understanding/return demonstration of topics  presented.  Learning Barriers/Preferences:  Learning Barriers/Preferences - 07/04/23 8295       Learning Barriers/Preferences   Learning Barriers Sight;Hearing   glasses, hearing aids (stopped working)   Acupuncturist None             Education Topics: How Lungs Work and Diseases: - Discuss the anatomy of the lungs and diseases that can affect the lungs, such as COPD. Flowsheet Row PULMONARY REHAB OTHER RESPIRATORY from 12/29/2016 in [facility]  Date 12/29/16  Educator GC  Instruction Review Code 2- meets goals/outcomes       Exercise: -Discuss the importance of exercise, FITT principles of exercise, normal  and abnormal responses to exercise, and how to exercise safely. Flowsheet Row PULMONARY REHAB OTHER RESPIRATORY from 10/20/2016 in [place] [facility]  Date 10/06/16  Educator Timmy Forbes  Instruction Review Code 2- meets goals/outcomes       Environmental Irritants: -Discuss types of environmental irritants and how to limit exposure to environmental irritants. Flowsheet Row PULMONARY REHAB OTHER RESPIRATORY from 10/20/2016 in [place] [facility]  Date 10/13/16  Educator GC  Instruction Review Code 2- meets goals/outcomes       Meds/Inhalers and oxygen: - Discuss respiratory medications, definition of an inhaler and oxygen, and the proper way to use an inhaler and oxygen. Flowsheet Row PULMONARY REHAB OTHER RESPIRATORY from 10/20/2016 in [place] [facility]  Date 10/20/16  Educator GC  Instruction Review Code 2- meets goals/outcomes       Energy Saving Techniques: - Discuss methods to conserve energy and decrease shortness of breath when performing activities of daily living.  Flowsheet Row PULMONARY REHAB OTHER RESPIRATORY from 12/29/2016 in [place] [facility]  Date 10/27/16  Educator Timmy Forbes  Instruction Review Code 2- meets goals/outcomes       Bronchial Hygiene /  Breathing Techniques: - Discuss breathing mechanics, pursed-lip breathing technique,  proper posture, effective ways to clear airways, and other functional breathing techniques   Cleaning Equipment: - Provides group verbal and written instruction about the health risks of elevated stress, cause of high stress, and healthy ways to reduce stress.   Nutrition I: Fats: - Discuss the types of cholesterol, what cholesterol does to the body, and how cholesterol levels can be controlled. Flowsheet Row PULMONARY REHAB OTHER RESPIRATORY from 09/07/2023 in [place] [facility]  Date 08/03/23  Educator HB  Instruction Review Code 1- Verbalizes Understanding       Nutrition II: Labels: -Discuss the different components of food labels and how to read food labels. Flowsheet Row CARDIAC REHAB PHASE II EXERCISE from 06/14/2017 in [place] [facility]  Date 05/31/17  Educator GC  Instruction Review Code 2- Demonstrated Understanding       Respiratory Infections: - Discuss the signs and symptoms of respiratory infections, ways to prevent respiratory infections, and the importance of seeking medical treatment when having a respiratory infection. Flowsheet Row PULMONARY REHAB OTHER RESPIRATORY from 09/07/2023 in [place] [facility]  Date 07/06/23  Educator HB  Instruction Review Code 1- Verbalizes Understanding       Stress I: Signs and Symptoms: - Discuss the causes of stress, how stress may lead to anxiety and depression, and ways to limit stress. Flowsheet Row PULMONARY REHAB OTHER RESPIRATORY from 09/07/2023 in [place] [facility]  Date 08/10/23  Educator HB  Instruction Review Code 1- Verbalizes Understanding       Stress II: Relaxation: -Discuss relaxation techniques to limit stress. Flowsheet Row CARDIAC REHAB PHASE II EXERCISE from 06/14/2017 in [place] [facility]  Date 03/15/17  Educator DJ  Instruction Review  Code 2- Demonstrated Understanding       Oxygen for Home/Travel: - Discuss how to prepare for travel when on oxygen and proper ways to transport and store oxygen to ensure safety. Flowsheet Row PULMONARY REHAB OTHER RESPIRATORY from 10/20/2016 in [place] [facility]  Date 09/22/16  Educator GC  Instruction Review Code 2- meets goals/outcomes       Knowledge Questionnaire Score:  Knowledge Questionnaire Score - 07/04/23 1411       Knowledge Questionnaire Score   Pre Score 15/18  Core Components/Risk Factors/Patient Goals at Admission:  Personal Goals and Risk Factors at Admission - 07/04/23 1411       Core Components/Risk Factors/Patient Goals on Admission    Weight Management Yes;Weight Loss    Intervention Weight Management: Develop a combined nutrition and exercise program designed to reach desired caloric intake, while maintaining appropriate intake of nutrient and fiber, sodium and fats, and appropriate energy expenditure required for the weight goal.;Weight Management: Provide education and appropriate resources to help participant work on and attain dietary goals.;Weight Management/Obesity: Establish reasonable short term and long term weight goals.    Admit Weight 178 lb 8 oz (81 kg)    Goal Weight: Short Term 173 lb (78.5 kg)    Goal Weight: Long Term 170 lb (77.1 kg)    Expected Outcomes Short Term: Continue to assess and modify interventions until short term weight is achieved;Long Term: Adherence to nutrition and physical activity/exercise program aimed toward attainment of established weight goal;Weight Loss: Understanding of general recommendations for a balanced deficit meal plan, which promotes 1-2 lb weight loss per week and includes a negative energy balance of [phone] kcal/d;Understanding recommendations for meals to include 15-35% energy as protein, 25-35% energy from fat, 35-60% energy from carbohydrates, less than 200mg  of dietary  cholesterol, 20-35 gm of total fiber daily;Understanding of distribution of calorie intake throughout the day with the consumption of 4-5 meals/snacks    Improve shortness of breath with ADL's Yes    Intervention Provide education, individualized exercise plan and daily activity instruction to help decrease symptoms of SOB with activities of daily living.    Expected Outcomes Short Term: Improve cardiorespiratory fitness to achieve a reduction of symptoms when performing ADLs;Long Term: Be able to perform more ADLs without symptoms or delay the onset of symptoms    Increase knowledge of respiratory medications and ability to use respiratory devices properly  Yes    Intervention Provide education and demonstration as needed of appropriate use of medications, inhalers, and oxygen therapy.    Expected Outcomes Short Term: Achieves understanding of medications use. Understands that oxygen is a medication prescribed by physician. Demonstrates appropriate use of inhaler and oxygen therapy.;Long Term: Maintain appropriate use of medications, inhalers, and oxygen therapy.    Diabetes Yes    Intervention Provide education about signs/symptoms and action to take for hypo/hyperglycemia.;Provide education about proper nutrition, including hydration, and aerobic/resistive exercise prescription along with prescribed medications to achieve blood glucose in normal ranges: Fasting glucose 65-99 mg/dL    Expected Outcomes Short Term: Participant verbalizes understanding of the signs/symptoms and immediate care of hyper/hypoglycemia, proper foot care and importance of medication, aerobic/resistive exercise and nutrition plan for blood glucose control.;Long Term: Attainment of HbA1C < 7%.    Heart Failure Yes    Intervention Provide a combined exercise and nutrition program that is supplemented with education, support and counseling about heart failure. Directed toward relieving symptoms such as shortness of breath,  decreased exercise tolerance, and extremity edema.    Expected Outcomes Improve functional capacity of life;Short term: Attendance in program 2-3 days a week with increased exercise capacity. Reported lower sodium intake. Reported increased fruit and vegetable intake. Reports medication compliance.;Short term: Daily weights obtained and reported for increase. Utilizing diuretic protocols set by physician.;Long term: Adoption of self-care skills and reduction of barriers for early signs and symptoms recognition and intervention leading to self-care maintenance.    Lipids Yes    Intervention Provide education and support for participant on nutrition & aerobic/resistive exercise  along with prescribed medications to achieve LDL 70mg , HDL >40mg .    Expected Outcomes Short Term: Participant states understanding of desired cholesterol values and is compliant with medications prescribed. Participant is following exercise prescription and nutrition guidelines.;Long Term: Cholesterol controlled with medications as prescribed, with individualized exercise RX and with personalized nutrition plan. Value goals: LDL < 70mg , HDL > 40 mg.             Core Components/Risk Factors/Patient Goals Review:   Goals and Risk Factor Review     Row Name 08/04/23 0754 08/29/23 1107           Core Components/Risk Factors/Patient Goals Review   Personal Goals Review Weight Management/Obesity;Improve shortness of breath with ADL's;Other (P)  Weight Management/Obesity;Improve shortness of breath with ADL's;Diabetes      Review Athena Bland is doing well in rehab. (P)  Athena Bland has been doing well in rehab. He has been checking his sugar and it has been WNL. He has started gettting dizzy latly when changing positions during the mid mornings it tends to happen. He has been to his vistion checked by [facility] and regular doctor but he really hasnt gotten any answers. He has an appointment in June.      Expected Outcomes -- continue to check and  monitor sugar keep an eye on his BP and water intake as well when he feels dizzy spells               Core Components/Risk Factors/Patient Goals at Discharge (Final Review):   Goals and Risk Factor Review - 08/29/23 1107       Core Components/Risk Factors/Patient Goals Review   Personal Goals Review Weight Management/Obesity;Improve shortness of breath with ADL's;Diabetes    Review Athena Bland has been doing well in rehab. He has been checking his sugar and it has been WNL. He has started gettting dizzy latly when changing positions during the mid mornings it tends to happen. He has been to his vistion checked by [facility] and regular doctor but he really hasnt gotten any answers. He has an appointment in June.    Expected Outcomes continue to check and monitor sugar keep an eye on his BP and water intake as well when he feels dizzy spells             ITP Comments:  ITP Comments     Row Name 07/04/23 1051 07/26/23 0940 08/23/23 1137 09/20/23 1357     ITP Comments Patient attend orientation today.  Patient is attending Pulmonary Rehabilitation Program.  Documentation for diagnosis can be found in 06/19/23 and 06/23/23.  Reviewed medical chart, RPE/RPD, gym safety, and program guidelines.  Patient was fitted to equipment they will be using during rehab.  Patient is scheduled to start exercise on Thursday 07/06/23 at 1030.   Initial ITP created and sent for review and signature by Dr. Gwendalyn Lemma, Medical Director for Pulmonary Rehabilitation Program. 30 day review completed. ITP sent to Dr.Jehanzeb Memon, Medical Director of  Pulmonary Rehab. Continue with ITP unless changes are made by physician. Pt is newer to program. 30 day review completed. ITP sent to Dr.Jehanzeb Memon, Medical Director of  Pulmonary Rehab. Continue with ITP unless changes are made by physician. 30 day review completed. ITP sent to Dr.Jehanzeb Memon, Medical Director of  Pulmonary Rehab. Continue with ITP unless changes are made by  physician.             Comments: 30 day review

## 2023-09-21 ENCOUNTER — Encounter (HOSPITAL_COMMUNITY)
Admission: RE | Admit: 2023-09-21 | Discharge: 2023-09-21 | Disposition: A | Discharge status: Home / Self Care | Payer: No Typology Code available for payment source | Source: Ambulatory Visit | Attending: Cardiology | Admitting: Cardiology

## 2023-09-21 DIAGNOSIS — J849 Interstitial pulmonary disease, unspecified: Secondary | ICD-10-CM

## 2023-09-21 NOTE — Progress Notes (Signed)
 Daily Session Note  Patient Details  Name: Caleb Butler MRN: [id number] Date of Birth: Aug 05, 1945 Referring Provider:   Flowsheet Row PULMONARY REHAB OTHER RESP ORIENTATION from 07/04/2023 in [facility]  Referring Provider Armida Lander MD  [facility] Pulmonologist: Dr. Waylan Haggard       Encounter Date: 09/21/2023  Check In:  Session Check In - 09/21/23 1015       Check-In   Supervising physician immediately available to respond to emergencies See telemetry face sheet for immediately available MD    Location [facility]    Staff Present Doug Gehrig, RN, BSN;Heather Toy Freund, BS, Exercise Physiologist    Virtual Visit No    Medication changes reported     No    Fall or balance concerns reported    No    Warm-up and Cool-down Performed on first and last piece of equipment    Resistance Training Performed Yes    VAD Patient? No    PAD/SET Patient? No      Pain Assessment   Currently in Pain? No/denies    Multiple Pain Sites No             Capillary Blood Glucose: No results found for this or any previous visit (from the past 24 hours).    Social History   Tobacco Use  Smoking Status Never  Smokeless Tobacco Never    Goals Met:  Proper associated with RPD/PD & O2 Sat Independence with exercise equipment Using PLB without cueing & demonstrates good technique Exercise tolerated well No report of concerns or symptoms today Strength training completed today  Goals Unmet:  Not Applicable  Comments: Pt able to follow exercise prescription today without complaint.  Will continue to monitor for progression.

## 2023-09-26 ENCOUNTER — Encounter (HOSPITAL_COMMUNITY)
Admission: RE | Admit: 2023-09-26 | Discharge: 2023-09-26 | Disposition: A | Discharge status: Home / Self Care | Payer: No Typology Code available for payment source | Source: Ambulatory Visit | Attending: Cardiology | Admitting: Cardiology

## 2023-09-26 DIAGNOSIS — J849 Interstitial pulmonary disease, unspecified: Secondary | ICD-10-CM | POA: Diagnosis not present

## 2023-09-26 DIAGNOSIS — R0602 Shortness of breath: Secondary | ICD-10-CM

## 2023-09-26 NOTE — Progress Notes (Signed)
 Daily Session Note  Patient Details  Name: Treven Pegan MRN: [id number] Date of Birth: [birth year] Referring Provider:   Flowsheet Row PULMONARY REHAB OTHER RESP ORIENTATION from 07/04/2023 in [facility] CARDIAC REH[facility]  Referring Provider Armida Lander MD  [facility] Pulmonologist: Dr. Waylan Haggard       Encounter Date: 09/26/2023  Check In:  Session Check In - 09/26/23 1040       Check-In   Staff Present Jefferey Minerva, RN;Heather Toy Freund, BS, Exercise Physiologist;Brittany Annette Barters, BSN, RN, WTA-C    Virtual Visit No    Medication changes reported     No    Fall or balance concerns reported    No    Warm-up and Cool-down Performed on first and last piece of equipment    Resistance Training Performed Yes    VAD Patient? No    PAD/SET Patient? No      Pain Assessment   Currently in Pain? No/denies             Capillary Blood Glucose: No results found for this or any previous visit (from the past 24 hours).    Social History   Tobacco Use  Smoking Status Never  Smokeless Tobacco Never    Goals Met:  Independence with exercise equipment Exercise tolerated well Strength training completed today  Goals Unmet:  Not Applicable  Comments: Pt able to follow exercise prescription today without complaint.  Will continue to monitor for progression.

## 2023-09-28 ENCOUNTER — Encounter (HOSPITAL_COMMUNITY): Payer: No Typology Code available for payment source

## 2023-10-03 ENCOUNTER — Encounter (HOSPITAL_COMMUNITY)
Admission: RE | Admit: 2023-10-03 | Discharge: 2023-10-03 | Disposition: A | Discharge status: Home / Self Care | Payer: No Typology Code available for payment source | Source: Ambulatory Visit | Attending: Cardiology | Admitting: Cardiology

## 2023-10-03 DIAGNOSIS — R0602 Shortness of breath: Secondary | ICD-10-CM | POA: Insufficient documentation

## 2023-10-03 DIAGNOSIS — J849 Interstitial pulmonary disease, unspecified: Secondary | ICD-10-CM | POA: Diagnosis not present

## 2023-10-03 NOTE — Progress Notes (Signed)
 Daily Session Note  Patient Details  Name: Caleb Butler MRN: [id number] Date of Birth: Jul 14, 1945 Referring Provider:   Flowsheet Row PULMONARY REHAB OTHER RESP ORIENTATION from 07/04/2023 in [facility] CARDIAC REHABILITATION  Referring Provider Armida Lander MD  [facility] Pulmonologist: Dr. Waylan Haggard       Encounter Date: 10/03/2023  Check In:  Session Check In - 10/03/23 1049       Check-In   Supervising physician immediately available to respond to emergencies See telemetry face sheet for immediately available MD    Location [facility]    Staff Present Clotilda Danish, BS, Exercise Physiologist;Jermane Brayboy Zoila Hines, MA, RCEP, CCRP, CCET;Brooke Booth, RN    Virtual Visit No    Medication changes reported     No    Fall or balance concerns reported    No    Warm-up and Cool-down Performed on first and last piece of equipment    Resistance Training Performed Yes    VAD Patient? No    PAD/SET Patient? No      Pain Assessment   Currently in Pain? No/denies             Capillary Blood Glucose: No results found for this or any previous visit (from the past 24 hours).    Social History   Tobacco Use  Smoking Status Never  Smokeless Tobacco Never    Goals Met:  Proper associated with RPD/PD & O2 Sat Independence with exercise equipment Exercise tolerated well No report of concerns or symptoms today Strength training completed today  Goals Unmet:  Not Applicable  Comments: Pt able to follow exercise prescription today without complaint.  Will continue to monitor for progression.

## 2023-10-04 ENCOUNTER — Other Ambulatory Visit: Payer: Self-pay | Admitting: Family Medicine

## 2023-10-04 DIAGNOSIS — I25119 Atherosclerotic heart disease of native coronary artery with unspecified angina pectoris: Secondary | ICD-10-CM

## 2023-10-05 ENCOUNTER — Encounter (HOSPITAL_COMMUNITY)
Admission: RE | Admit: 2023-10-05 | Discharge: 2023-10-05 | Disposition: A | Discharge status: Home / Self Care | Payer: No Typology Code available for payment source | Source: Ambulatory Visit | Attending: Cardiology | Admitting: Cardiology

## 2023-10-05 DIAGNOSIS — J849 Interstitial pulmonary disease, unspecified: Secondary | ICD-10-CM | POA: Diagnosis not present

## 2023-10-05 NOTE — Progress Notes (Signed)
 Daily Session Note  Patient Details  Name: Caleb Butler MRN: [id number] Date of Birth: 1945/10/31 Referring Provider:   Flowsheet Row PULMONARY REHAB OTHER RESP ORIENTATION from 07/04/2023 in [facility] CARDIAC REHABILITATION  Referring Provider Armida Lander MD  [facility] Pulmonologist: Dr. Waylan Haggard       Encounter Date: 10/05/2023  Check In:  Session Check In - 10/05/23 1037       Check-In   Supervising physician immediately available to respond to emergencies See telemetry face sheet for immediately available MD    Location [facility]    Staff Present Jerrol Morelle, BSN, RN, WTA-C;Heather Toy Freund, BS, Exercise Physiologist;Brooke Rollin Clock, RN    Virtual Visit No    Medication changes reported     No    Fall or balance concerns reported    No    Tobacco Cessation No Change    Warm-up and Cool-down Performed on first and last piece of equipment    Resistance Training Performed Yes    VAD Patient? No    PAD/SET Patient? No      Pain Assessment   Currently in Pain? No/denies             Capillary Blood Glucose: No results found for this or any previous visit (from the past 24 hours).    Social History   Tobacco Use  Smoking Status Never  Smokeless Tobacco Never    Goals Met:  Proper associated with RPD/PD & O2 Sat Independence with exercise equipment Improved SOB with ADL's Using PLB without cueing & demonstrates good technique Exercise tolerated well No report of concerns or symptoms today Strength training completed today  Goals Unmet:  Not Applicable  Comments: Pt able to follow exercise prescription today without complaint.  Will continue to monitor for progression.

## 2023-10-10 ENCOUNTER — Encounter (HOSPITAL_COMMUNITY): Payer: No Typology Code available for payment source

## 2023-10-12 ENCOUNTER — Encounter (HOSPITAL_COMMUNITY)
Admission: RE | Admit: 2023-10-12 | Discharge: 2023-10-12 | Disposition: A | Discharge status: Home / Self Care | Payer: No Typology Code available for payment source | Source: Ambulatory Visit | Attending: Cardiology | Admitting: Cardiology

## 2023-10-12 DIAGNOSIS — R0602 Shortness of breath: Secondary | ICD-10-CM

## 2023-10-12 DIAGNOSIS — J849 Interstitial pulmonary disease, unspecified: Secondary | ICD-10-CM | POA: Diagnosis not present

## 2023-10-12 NOTE — Progress Notes (Signed)
 Daily Session Note  Patient Details  Name: Cabell Baray MRN: [id number] Date of Birth: [birth year] Referring Provider:   Flowsheet Row PULMONARY REHAB OTHER RESP ORIENTATION from 07/04/2023 in [facility] CARDIAC REHABILITATION  Referring Provider Armida Lander MD  [facility] Pulmonologist: Dr. Waylan Haggard       Encounter Date: 10/12/2023  Check In:  Session Check In - 10/12/23 1030       Check-In   Supervising physician immediately available to respond to emergencies See telemetry face sheet for immediately available MD    Location [facility]    Staff Present Clotilda Danish, BS, Exercise Physiologist;Jessica Zoila Hines, MA, RCEP, CCRP, CCET    Virtual Visit No    Medication changes reported     No    Fall or balance concerns reported    No    Tobacco Cessation No Change    Warm-up and Cool-down Performed on first and last piece of equipment    Resistance Training Performed Yes    VAD Patient? No    PAD/SET Patient? No      Pain Assessment   Currently in Pain? No/denies    Multiple Pain Sites No             Capillary Blood Glucose: No results found for this or any previous visit (from the past 24 hours).    Social History   Tobacco Use  Smoking Status Never  Smokeless Tobacco Never    Goals Met:  Independence with exercise equipment Exercise tolerated well No report of concerns or symptoms today Strength training completed today  Goals Unmet:  Not Applicable  Comments: Pt able to follow exercise prescription today without complaint.  Will continue to monitor for progression.

## 2023-10-17 ENCOUNTER — Encounter (HOSPITAL_COMMUNITY)
Admission: RE | Admit: 2023-10-17 | Discharge: 2023-10-17 | Disposition: A | Discharge status: Home / Self Care | Payer: No Typology Code available for payment source | Source: Ambulatory Visit | Attending: Cardiology | Admitting: Cardiology

## 2023-10-17 DIAGNOSIS — J849 Interstitial pulmonary disease, unspecified: Secondary | ICD-10-CM

## 2023-10-17 NOTE — Progress Notes (Signed)
 Daily Session Note  Patient Details  Name: Kristion Holifield MRN: [id number] Date of Birth: 18-Oct-1945 Referring Provider:   Flowsheet Row PULMONARY REHAB OTHER RESP ORIENTATION from 07/04/2023 in [facility] CARDIAC REHABILITATION  Referring Provider Armida Lander MD  [facility] Pulmonologist: Dr. Waylan Haggard       Encounter Date: 10/17/2023  Check In:  Session Check In - 10/17/23 1030       Check-In   Supervising physician immediately available to respond to emergencies See telemetry face sheet for immediately available MD    Location [facility]    Staff Present Jerrol Morelle, BSN, RN, WTA-C;Heather Toy Freund, BS, Exercise Physiologist;Jessica Zoila Hines, MA, RCEP, CCRP, CCET    Virtual Visit No    Medication changes reported     No    Fall or balance concerns reported    No    Tobacco Cessation No Change    Warm-up and Cool-down Performed on first and last piece of equipment    Resistance Training Performed Yes    VAD Patient? No    PAD/SET Patient? No             Capillary Blood Glucose: No results found for this or any previous visit (from the past 24 hours).    Social History   Tobacco Use  Smoking Status Never  Smokeless Tobacco Never    Goals Met:  Proper associated with RPD/PD & O2 Sat Independence with exercise equipment Improved SOB with ADL's Using PLB without cueing & demonstrates good technique Exercise tolerated well No report of concerns or symptoms today Strength training completed today  Goals Unmet:  Not Applicable  Comments: Pt able to follow exercise prescription today without complaint.  Will continue to monitor for progression.

## 2023-10-18 ENCOUNTER — Encounter (HOSPITAL_COMMUNITY): Payer: Self-pay | Admitting: *Deleted

## 2023-10-18 DIAGNOSIS — R0602 Shortness of breath: Secondary | ICD-10-CM

## 2023-10-18 DIAGNOSIS — J849 Interstitial pulmonary disease, unspecified: Secondary | ICD-10-CM

## 2023-10-18 NOTE — Progress Notes (Signed)
 Pulmonary Individual Treatment Plan  Patient Details  Name: Caleb Butler MRN: [id number] Date of Birth: July 01, 1945 Referring Provider:   Flowsheet Row PULMONARY REHAB OTHER RESP ORIENTATION from 07/04/2023 in [facility]ARD[facility] REH[facility]ATION  Referring Provider Armida Lander MD  [Primary Pulmonologist: Dr. Waylan Haggard       Initial Encounter Date:  Flowsheet Row PULMONARY REHAB OTHER RESP ORIENTATION from 07/04/2023 in [facility]  Date 07/04/23       Visit Diagnosis: ILD (interstitial lung disease) (HCC)  SOB (shortness of breath)  Patient's Home Medications on Admission:   Current Outpatient Medications:    Accu-Chek FastClix Lancets MISC, USE TO CHECK BLOOD SUGAR TWICE DAILY E11.9, Disp: 102 each, Rfl: 12   acetaminophen  (TYLENOL ) 500 MG tablet, Take 500-1,000 mg by mouth daily as needed for moderate pain or headache., Disp: , Rfl:    aspirin  81 MG EC tablet, Take 81 mg by mouth at bedtime. , Disp: , Rfl:    blood glucose meter kit and supplies, Dispense based on patient and insurance preference. Use up to four times daily as directed. (FOR ICD-10 E10.9, E11.9)., Disp: 1 each, Rfl: 0   Blood Glucose Monitoring Suppl DEVI, 1 each by Does not apply route in the morning, at noon, and at bedtime. May substitute to any manufacturer covered by patient's insurance. Needs Onetouch by [employer], Disp: 1 each, Rfl: 0   Capsaicin 0.1 % CREA, Apply topically as needed., Disp: , Rfl:    clopidogrel  (PLAVIX ) 75 MG tablet, TAKE 1 TABLET BY MOUTH EVERY DAY, Disp: 90 tablet, Rfl: 0   famotidine (PEPCID) 40 MG tablet, Take 40 mg by mouth daily., Disp: , Rfl:    levothyroxine  (SYNTHROID ) 75 MCG tablet, Take 1 tablet (75 mcg total) by mouth daily., Disp: 90 tablet, Rfl: 3   mometasone  (ELOCON ) 0.1 % ointment, Apply topically daily., Disp: 45 g, Rfl: 0   Multiple Vitamin (MULTIVITAMIN WITH MINERALS) TABS tablet, Take 1 tablet by mouth daily., Disp: , Rfl:    rosuvastatin   (CRESTOR ) 20 MG tablet, TAKE 1 TABLET BY MOUTH EVERY DAY, Disp: 90 tablet, Rfl: 3   Semaglutide ,0.25 or 0.5MG /DOS, (OZEMPIC , 0.25 OR 0.5 MG/DOSE,) 2 MG/1.5ML SOPN, Inject 0.5 mg into the skin once a week., Disp: 1.5 mL, Rfl: 3   Tiotropium Bromide-Olodaterol (STIOLTO RESPIMAT ) 2.5-2.5 MCG/ACT AERS, Inhale 2 puffs into the lungs daily., Disp: 1 each, Rfl: 5   traZODone  (DESYREL ) 50 MG tablet, Take 1 tablet (50 mg total) by mouth at bedtime. (Patient not taking: Reported on 07/31/2023), Disp: 90 tablet, Rfl: 3   vitamin B-12 (CYANOCOBALAMIN) 1000 MCG tablet, Take 1,000 mcg by mouth daily., Disp: , Rfl:   Past Medical History: Past Medical History:  Diagnosis Date   Anxiety    Aortic aneurysm (HCC)    dilated aortic root on echo 2024   Arthritis    "my back is loaded w/it" (01/06/2017)   CAD (coronary artery disease)    80% ostial lesion in L circumflex (2017)   Chronic lower back pain    GERD (gastroesophageal reflux disease)    Hiatal hernia    High cholesterol    Hypothyroidism    IBS (irritable bowel syndrome)    Numbness and tingling of right lower extremity    "since 11/30/2016" (01/06/2017)   PMR (polymyalgia rheumatica) (HCC)    Stroke (HCC)    Type 2 diabetes mellitus (HCC)     Tobacco Use: Social History   Tobacco Use  Smoking Status Never  Smokeless  Tobacco Never    Labs: Review Flowsheet  More data exists      Latest Ref Rng & Units 08/12/2019 02/20/2020 02/24/2022 04/04/2023 07/31/2023  Labs for ITP Cardiac and Pulmonary Rehab  Cholestrol <200 mg/dL 562  - 130  865  784   LDL (calc) mg/dL (calc) 48  - 45  35  46   HDL-C > OR = 40 mg/dL 40  - 45  40  47   Trlycerides <150 mg/dL 696  - 295  284  132   Hemoglobin A1c <5.7 % of total Hgb 6.6  7.5  6.8  6.8  7.7     Capillary Blood Glucose: Lab Results  Component Value Date   GLUCAP 171 (H) 07/13/2023   GLUCAP 225 (H) 07/06/2023   GLUCAP 111 (H) 03/13/2018   GLUCAP 167 (H) 01/07/2017   GLUCAP 178 (H) 01/06/2017      Pulmonary Assessment Scores:  Pulmonary Assessment Scores     Row Name 07/04/23 1413         ADL UCSD   ADL Phase Entry     SOB Score total 25     Rest 0     Walk 1     Stairs 3     Bath 0     Dress 1     Shop 1       CAT Score   CAT Score 16       mMRC Score   mMRC Score 2             UCSD: Self-administered rating of dyspnea associated with activities of daily living (ADLs) 6-point scale (0 = "not at all" to 5 = "maximal or unable to do because of breathlessness")  Scoring Scores range from 0 to 120.  Minimally important difference is 5 units  CAT: CAT can identify the health impairment of COPD patients and is better correlated with disease progression.  CAT has a scoring range of zero to 40. The CAT score is classified into four groups of low (less than 10), medium (10 - 20), high (21-30) and very high (31-40) based on the impact level of disease on health status. A CAT score over 10 suggests significant symptoms.  A worsening CAT score could be explained by an exacerbation, poor medication adherence, poor inhaler technique, or progression of COPD or comorbid conditions.  CAT MCID is 2 points  mMRC: mMRC (Modified Medical Research Council) Dyspnea Scale is used to assess the degree of baseline functional disability in patients of respiratory disease due to dyspnea. No minimal important difference is established. A decrease in score of 1 point or greater is considered a positive change.   Pulmonary Function Assessment:  Pulmonary Function Assessment - 07/04/23 1413       Breath   Shortness of Breath Yes;Limiting activity;Fear of Shortness of Breath             Exercise Target Goals: Exercise Program Goal: Individual exercise prescription set using results from initial 6 min walk test and THRR while considering  patient's activity barriers and safety.   Exercise Prescription Goal: Initial exercise prescription builds to 30-45 minutes a day of  aerobic activity, 2-3 days per week.  Home exercise guidelines will be given to patient during program as part of exercise prescription that the participant will acknowledge.  Activity Barriers & Risk Stratification:  Activity Barriers & Cardiac Risk Stratification - 07/04/23 0824       Activity Barriers & Cardiac Risk Stratification  Activity Barriers Joint Problems;Arthritis;Deconditioning;History of Falls;Muscular Weakness;Back Problems;Shortness of Breath   disc compression, chronic back pain uses horse linament, r leg numbness            6 Minute Walk:  6 Minute Walk     Row Name 07/04/23 1052         6 Minute Walk   Phase Initial     Distance 1140 feet     Walk Time 6 minutes     # of Rest Breaks 0     MPH 2.16     METS 2.14     RPE 11     Perceived Dyspnea  1     VO2 Peak 7.49     Symptoms Yes (comment)     Comments SOB     Resting HR 78 bpm     Resting BP 116/62     Resting Oxygen Saturation  96 %     Exercise Oxygen Saturation  during 6 min walk 95 %     Max Ex. HR 89 bpm     Max Ex. BP 126/64     2 Minute Post BP 118/62       Interval HR   1 Minute HR 86     2 Minute HR 88     3 Minute HR 87     4 Minute HR 88     5 Minute HR 79     6 Minute HR 89     2 Minute Post HR 81     Interval Heart Rate? Yes       Interval Oxygen   Interval Oxygen? Yes     Baseline Oxygen Saturation % 96 %     1 Minute Oxygen Saturation % 95 %     1 Minute Liters of Oxygen 0 L  Room Air     2 Minute Oxygen Saturation % 95 %     2 Minute Liters of Oxygen 0 L     3 Minute Oxygen Saturation % 95 %     3 Minute Liters of Oxygen 0 L     4 Minute Oxygen Saturation % 96 %     4 Minute Liters of Oxygen 0 L     5 Minute Oxygen Saturation % 95 %     5 Minute Liters of Oxygen 0 L     6 Minute Oxygen Saturation % 96 %     6 Minute Liters of Oxygen 0 L     2 Minute Post Oxygen Saturation % 96 %     2 Minute Post Liters of Oxygen 0 L              Oxygen Initial  Assessment:  Oxygen Initial Assessment - 07/04/23 0827       Home Oxygen   Home Oxygen Device None    Sleep Oxygen Prescription None    Home Exercise Oxygen Prescription None    Home Resting Oxygen Prescription None      Initial 6 min Walk   Oxygen Used None      Program Oxygen Prescription   Program Oxygen Prescription None      Intervention   Short Term Goals To learn and understand importance of monitoring SPO2 with pulse oximeter and demonstrate accurate use of the pulse oximeter.;To learn and understand importance of maintaining oxygen saturations>88%;To learn and demonstrate proper pursed lip breathing techniques or other breathing techniques. ;To learn and demonstrate proper use of respiratory medications  Long  Term Goals Maintenance of O2 saturations>88%;Compliance with respiratory medication;Verbalizes importance of monitoring SPO2 with pulse oximeter and return demonstration;Exhibits proper breathing techniques, such as pursed lip breathing or other method taught during program session;Demonstrates proper use of MDI's             Oxygen Re-Evaluation:  Oxygen Re-Evaluation     Row Name 08/29/23 1114             Program Oxygen Prescription   Program Oxygen Prescription None         Home Oxygen   Home Oxygen Device None       Sleep Oxygen Prescription None       Home Exercise Oxygen Prescription None       Home Resting Oxygen Prescription None         Goals/Expected Outcomes   Short Term Goals To learn and understand importance of monitoring SPO2 with pulse oximeter and demonstrate accurate use of the pulse oximeter.;To learn and understand importance of maintaining oxygen saturations>88%;To learn and demonstrate proper pursed lip breathing techniques or other breathing techniques. ;To learn and demonstrate proper use of respiratory medications       Long  Term Goals Maintenance of O2 saturations>88%;Compliance with respiratory medication;Verbalizes importance  of monitoring SPO2 with pulse oximeter and return demonstration;Exhibits proper breathing techniques, such as pursed lip breathing or other method taught during program session;Demonstrates proper use of MDI's       Comments Caleb Butler is doing well in rehab. 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 His oxygen levels during exercise has been staying in the upper 90s.       Goals/Expected Outcomes contnmue to exercise at home and attend rehab and working on PLB                Oxygen Discharge (Final Oxygen Re-Evaluation):  Oxygen Re-Evaluation - 08/29/23 1114       Program Oxygen Prescription   Program Oxygen Prescription None      Home Oxygen   Home Oxygen Device None    Sleep Oxygen Prescription None    Home Exercise Oxygen Prescription None    Home Resting Oxygen Prescription None      Goals/Expected Outcomes   Short Term Goals To learn and understand importance of monitoring SPO2 with pulse oximeter and demonstrate accurate use of the pulse oximeter.;To learn and understand importance of maintaining oxygen saturations>88%;To learn and demonstrate proper pursed lip breathing techniques or other breathing techniques. ;To learn and demonstrate proper use of respiratory medications    Long  Term Goals Maintenance of O2 saturations>88%;Compliance with respiratory medication;Verbalizes importance of monitoring SPO2 with pulse oximeter and return demonstration;Exhibits proper breathing techniques, such as pursed lip breathing or other method taught during program session;Demonstrates proper use of MDI's    Comments Caleb Butler is doing well in rehab. 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 His oxygen levels during exercise has been staying in the upper 90s.    Goals/Expected Outcomes contnmue to exercise at home and attend rehab and  working on PLB             Initial Exercise Prescription:  Initial Exercise Prescription - 07/04/23 1100       Date of Initial Exercise RX and Referring Provider   Date 07/04/23  Referring Provider Armida Lander MD   Primary Pulmonologist: Dr. Waylan Haggard     Oxygen   Maintain Oxygen Saturation 88% or higher      Treadmill   MPH 2    Grade 0.5    Minutes 15    METs 2.67      REL-XR   Level 2    Speed 50    Minutes 15    METs 2.5      Prescription Details   Frequency (times per week) 2    Duration Progress to 30 minutes of continuous aerobic without signs/symptoms of physical distress      Intensity   THRR 40-80% of Max Heartrate 104-130    Ratings of Perceived Exertion 11-13    Perceived Dyspnea 0-4      Progression   Progression Continue to progress workloads to maintain intensity without signs/symptoms of physical distress.      Resistance Training   Training Prescription Yes    Weight 4 lb    Reps 10-15             Perform Capillary Blood Glucose checks as needed.  Exercise Prescription Changes:   Exercise Prescription Changes     Row Name 07/04/23 1100 07/11/23 1200 07/20/23 1200 08/08/23 1200 08/15/23 1500     Response to Exercise   Blood Pressure (Admit) 116/62 118/60 110/60 112/62 112/70   Blood Pressure (Exercise) 126/64 118/62 112/62 -- --   Blood Pressure (Exit) 112/66 108/68 106/60 110/60 110/60   Heart Rate (Admit) 78 bpm 78 bpm 63 bpm 61 bpm 68 bpm   Heart Rate (Exercise) 89 bpm 103 bpm 86 bpm 100 bpm 85 bpm   Heart Rate (Exit) 84 bpm 85 bpm 76 bpm 81 bpm 81 bpm   Oxygen Saturation (Admit) 96 % 96 % 99 % 97 % 99 %   Oxygen Saturation (Exercise) 95 % 93 % 96 % 96 % 97 %   Oxygen Saturation (Exit) 93 % 97 % 96 % 97 % 98 %   Rating of Perceived Exertion (Exercise) 11 15 12 13 12    Perceived Dyspnea (Exercise) 1 3 3 3 2    Symptoms SOb -- -- -- --   Comments walk test results -- -- -- --   Duration -- Continue with 30 min of aerobic  exercise without signs/symptoms of physical distress. Continue with 30 min of aerobic exercise without signs/symptoms of physical distress. Continue with 30 min of aerobic exercise without signs/symptoms of physical distress. Continue with 30 min of aerobic exercise without signs/symptoms of physical distress.   Intensity -- THRR unchanged THRR unchanged THRR unchanged THRR unchanged     Progression   Progression -- Continue to progress workloads to maintain intensity without signs/symptoms of physical distress. Continue to progress workloads to maintain intensity without signs/symptoms of physical distress. Continue to progress workloads to maintain intensity without signs/symptoms of physical distress. Continue to progress workloads to maintain intensity without signs/symptoms of physical distress.     Resistance Training   Training Prescription -- Yes Yes Yes Yes   Weight -- 4 4 4 4    Reps -- 10-15 10-15 10-15 10-15     Treadmill   MPH -- 2.5 2.5 3 2.8   Grade -- 0.5 1.5 2 1.5   Minutes -- 15 15 15 15    METs -- 3.09 3.43 4.12 3.72     REL-XR   Level -- 2 2 2 2    Speed -- 83 61 62 68  Minutes -- 15 15 15 15    METs -- 4.7 4.6 3.6 5     Oxygen   Maintain Oxygen Saturation -- -- 88% or higher 88% or higher 88% or higher    Row Name 09/05/23 1500 10/03/23 1500           Response to Exercise   Blood Pressure (Admit) 112/70 120/60      Blood Pressure (Exit) 116/90 100/60      Heart Rate (Admit) 58 bpm 62 bpm      Heart Rate (Exercise) 81 bpm 74 bpm      Heart Rate (Exit) 69 bpm 77 bpm      Oxygen Saturation (Admit) 99 % 99 %      Oxygen Saturation (Exercise) 95 % 93 %      Oxygen Saturation (Exit) 96 % 99 %      Rating of Perceived Exertion (Exercise) 13 13      Perceived Dyspnea (Exercise) 3 2      Duration Continue with 30 min of aerobic exercise without signs/symptoms of physical distress. Continue with 30 min of aerobic exercise without signs/symptoms of physical distress.       Intensity THRR unchanged THRR unchanged        Progression   Progression Continue to progress workloads to maintain intensity without signs/symptoms of physical distress. Continue to progress workloads to maintain intensity without signs/symptoms of physical distress.        Resistance Training   Training Prescription Yes Yes      Weight 5 5      Reps 10-15 10-15        Treadmill   MPH 2.5 2.5      Grade 2 2      Minutes 15 15      METs 3.6 3.6        REL-XR   Level 5 3      Speed 66 68      Minutes 15 15      METs 0.4 4.4        Oxygen   Maintain Oxygen Saturation 88% or higher 88% or higher               Exercise Comments:   Exercise Goals and Review:   Exercise Goals     Row Name 07/04/23 1103             Exercise Goals   Increase Physical Activity Yes       Intervention Provide advice, education, support and counseling about physical activity/exercise needs.;Develop an individualized exercise prescription for aerobic and resistive training based on initial evaluation findings, risk stratification, comorbidities and participant's personal goals.       Expected Outcomes Short Term: Attend rehab on a regular basis to increase amount of physical activity.;Long Term: Add in home exercise to make exercise part of routine and to increase amount of physical activity.;Long Term: Exercising regularly at least 3-5 days a week.       Increase Strength and Stamina Yes       Intervention Provide advice, education, support and counseling about physical activity/exercise needs.;Develop an individualized exercise prescription for aerobic and resistive training based on initial evaluation findings, risk stratification, comorbidities and participant's personal goals.       Expected Outcomes Short Term: Increase workloads from initial exercise prescription for resistance, speed, and METs.;Short Term: Perform resistance training exercises routinely during rehab and add in  resistance training at home;Long Term: Improve cardiorespiratory fitness, muscular  endurance and strength as measured by increased METs and functional capacity ( )       Able to understand and use rate of perceived exertion (RPE) scale Yes       Intervention Provide education and explanation on how to use RPE scale       Expected Outcomes Short Term: Able to use RPE daily in rehab to express subjective intensity level;Long Term:  Able to use RPE to guide intensity level when exercising independently       Able to understand and use Dyspnea scale Yes       Intervention Provide education and explanation on how to use Dyspnea scale       Expected Outcomes Short Term: Able to use Dyspnea scale daily in rehab to express subjective sense of shortness of breath during exertion;Long Term: Able to use Dyspnea scale to guide intensity level when exercising independently       Knowledge and understanding of Target Heart Rate Range (THRR) Yes       Intervention Provide education and explanation of THRR including how the numbers were predicted and where they are located for reference       Expected Outcomes Short Term: Able to state/look up THRR;Short Term: Able to use daily as guideline for intensity in rehab;Long Term: Able to use THRR to govern intensity when exercising independently       Able to check pulse independently Yes       Intervention Provide education and demonstration on how to check pulse in carotid and radial arteries.;Review the importance of being able to check your own pulse for safety during independent exercise       Expected Outcomes Short Term: Able to explain why pulse checking is important during independent exercise;Long Term: Able to check pulse independently and accurately       Understanding of Exercise Prescription Yes       Intervention Provide education, explanation, and written materials on patient's individual exercise prescription       Expected Outcomes Short Term: Able to  explain program exercise prescription;Long Term: Able to explain home exercise prescription to exercise independently                Exercise Goals Re-Evaluation :  Exercise Goals Re-Evaluation     Row Name 07/11/23 1251 07/24/23 0823 08/04/23 0748 08/09/23 0806 08/23/23 1345     Exercise Goal Re-Evaluation   Exercise Goals Review Increase Physical Activity;Increase Strength and Stamina;Understanding of Exercise Prescription Increase Physical Activity;Increase Strength and Stamina;Understanding of Exercise Prescription Increase Physical Activity;Increase Strength and Stamina;Understanding of Exercise Prescription Increase Physical Activity;Increase Strength and Stamina;Understanding of Exercise Prescription Increase Physical Activity;Increase Strength and Stamina;Understanding of Exercise Prescription   Comments Caleb Butler is doing well in rehab and is tolerating exercise well. He is SOB when he gets off the treadmill. He is only on his 3rd visit. Will continue to montior and progress as able. Caleb Butler is doing well in rehab and is tolerating exercise well. He is SOB when he gets off the treadmill. Will continue to montior and progress as able. Caleb Butler is doing well in rehab. 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 He is enjoying the program and getting to talk to everyone in class. Caleb Butler is doing well in rehab and is on his 11th visit. He has been increasing his speed on the treadmill to 3.0  with a grade of 2.0. Will continue to monitor and progress as able. Caleb Butler is doing well in rehab and is pushing himself. He is keeping his levels on his equipment and has not moved up in the past weeks. Will contiue to monitor and progress asable   Expected Outcomes Continue to attend rehab Continue to attend rehab Continue to attend rehab and exercise outside of class with continues walking continue to attend  rehab continue to attend rehab    Row Name 08/29/23 1054 10/03/23 1113           Exercise Goal Re-Evaluation   Exercise Goals Review Increase Physical Activity;Increase Strength and Stamina;Understanding of Exercise Prescription Increase Physical Activity;Increase Strength and Stamina;Understanding of Exercise Prescription      Comments Caleb Butler is doing well in rehab and is pushing himslef. He has noticed that he seems to be more in shape but has not notcied his breathing improving. 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 He hasnt needed to stop to take breaks as much. Caleb Butler is doing well in rehab.  He is able to do more in rehab but is still limited by his breathing.  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   continue to attend rehab Short; Contineu to try to walk more at home Long: Continue to improve stamina               Discharge Exercise Prescription (Final Exercise Prescription Changes):  Exercise Prescription Changes - 10/03/23 1500       Response to Exercise   Blood Pressure (Admit) 120/60    Blood Pressure (Exit) 100/60    Heart Rate (Admit) 62 bpm    Heart Rate (Exercise) 74 bpm    Heart Rate (Exit) 77 bpm    Oxygen Saturation (Admit) 99 %    Oxygen Saturation (Exercise) 93 %    Oxygen Saturation (Exit) 99 %    Rating of Perceived Exertion (Exercise) 13    Perceived Dyspnea (Exercise) 2    Duration Continue with 30 min of aerobic exercise without signs/symptoms of physical distress.    Intensity THRR unchanged      Progression   Progression Continue to progress workloads to maintain intensity without signs/symptoms of physical distress.      Resistance Training   Training Prescription Yes     Weight 5    Reps 10-15      Treadmill   MPH 2.5    Grade 2    Minutes 15    METs 3.6      REL-XR   Level 3    Speed 68    Minutes 15    METs 4.4      Oxygen   Maintain Oxygen Saturation 88% or higher             Nutrition:  Target Goals: Understanding of nutrition guidelines, daily intake of sodium 1500mg , cholesterol 200mg , calories 30% from fat and 7% or less from saturated fats, daily to have 5 or more servings of fruits and vegetables.  Biometrics:  Pre Biometrics - 07/04/23 1104       Pre Biometrics   Height 5'  8" (1.727 m)    Weight 178 lb 8 oz (81 kg)    Waist Circumference 38.5 inches    Hip Circumference 38.5 inches    Waist to Hip Ratio 1 %    BMI (Calculated) 27.15    Grip Strength 26.8 kg    Single Leg Stand 30 seconds              Nutrition Therapy Plan and Nutrition Goals:  Nutrition Therapy & Goals - 07/04/23 1407       Intervention Plan   Intervention Prescribe, educate and counsel regarding individualized specific dietary modifications aiming towards targeted core components such as weight, hypertension, lipid management, diabetes, heart failure and other comorbidities.;Nutrition handout(s) given to patient.    Expected Outcomes Short Term Goal: Understand basic principles of dietary content, such as calories, fat, sodium, cholesterol and nutrients.;Long Term Goal: Adherence to prescribed nutrition plan.             Nutrition Assessments:  MEDIFICTS Score Key: >=70 Need to make dietary changes  40-70 Heart Healthy Diet <= 40 Therapeutic Level Cholesterol Diet  Flowsheet Row PULMONARY REHAB OTHER RESP ORIENTATION from 07/04/2023 in [facility] CARDIAC REHABILITATION  Picture Your Plate Total Score on Admission 56      Picture Your Plate Scores: <16 Unhealthy dietary pattern with much room for improvement. 41-50 Dietary pattern unlikely to meet recommendations for good health and room for improvement. 51-60 More healthful  dietary pattern, with some room for improvement.  >60 Healthy dietary pattern, although there may be some specific behaviors that could be improved.    Nutrition Goals Re-Evaluation:  Nutrition Goals Re-Evaluation     Row Name 08/04/23 0750 08/29/23 1103 10/03/23 1115         Goals   Nutrition Goal Healthy eating Healthy eating Continue to try to drink water throughtout the day try to eat healthy meals and find foods he lieks to eat     Comment Caleb Butler is doing well in rehab. He has been cutting out sweets and watching what he eats. He is on ozempic  so he is trying to eat healthier and watching portion control/ Caleb Butler is doing well in rehab. He said his niurtion has not been welll lately. He is on ozempic  and stated that he just hasnt been hungery and not wanting to eat. He will eat breakfest and dinner with a protein shake for lunch. He is trying to drink more water during the day but is having a hard time. Caleb Butler is doing well in rehab.  He is working on his diet but still does not have much of an appetite.  He is trying to get in  enough protein.  He has stopped his ozempic .  He is trying to drink more.     Expected Outcome Continue to eat healthy and work on cutting out sweets and watching portion control Continue to try to drink water throughtout the day  try to eat healthy meals and find foods he lieks to eat Short: Continue to add in protein and eat throughout day LOng: continue to drink more              Nutrition Goals Discharge (Final Nutrition Goals Re-Evaluation):  Nutrition Goals Re-Evaluation - 10/03/23 1115       Goals   Nutrition Goal Continue to try to drink water throughtout the day try to eat healthy meals and find foods he lieks to eat    Comment Caleb Butler is doing well in rehab.  He is working on his diet but still does not have much of an appetite.  He is trying to get in  enough protein.  He has stopped his ozempic .  He is trying to drink more.    Expected Outcome Short:  Continue to add in protein and eat throughout day LOng: continue to drink more             Psychosocial: Target Goals: Acknowledge presence or absence of significant depression and/or stress, maximize coping skills, provide positive support system. Participant is able to verbalize types and ability to use techniques and skills needed for reducing stress and depression.  Initial Review & Psychosocial Screening:  Initial Psych Review & Screening - 07/04/23 4098       Initial Review   Current issues with Current Stress Concerns;Current Sleep Concerns    Source of Stress Concerns Chronic Illness;Unable to participate in former interests or hobbies;Unable to perform yard/household activities    Comments new puppy, had to put previous dog down after a stroke, history not sleeping well and up to bathroom (non alcholic beer helps too), breathing limits his activity any time he does anythign      Family Dynamics   Good Support System? Yes   wife (nurse), family lives in [place]     Barriers   Psychosocial barriers to participate in program The patient should benefit from training in stress management and relaxation.;Psychosocial barriers identified (see note)      Screening Interventions   Interventions Encouraged to exercise;Provide feedback about the scores to participant;To provide support and resources with identified psychosocial needs    Expected Outcomes Short Term goal: Utilizing psychosocial counselor, staff and physician to assist with identification of specific Stressors or current issues interfering with healing process. Setting desired goal for each stressor or current issue identified.;Long Term Goal: Stressors or current issues are controlled or eliminated.;Short Term goal: Identification and review with participant of any Quality of Life or Depression concerns found by scoring the questionnaire.;Long Term goal: The participant improves quality of Life and PHQ9 Scores as seen by  post scores and/or verbalization of changes             Quality of Life Scores:  Scores of 19 and below usually indicate a poorer quality of life in these areas.  A difference of  2-3 points is a clinically meaningful difference.  A difference of 2-3 points in the total score of the Quality of Life Index has been associated with significant improvement in overall quality of life, self-image, physical symptoms, and general health in studies assessing change in quality of life.   PHQ-9: Review Flowsheet  More data exists      10/03/2023 08/03/2023 07/04/2023 05/20/2022 03/25/2022  Depression screen PHQ 2/9  Decreased Interest 1 2 1  0 0  Down, Depressed, Hopeless 0 0 0 0 0  PHQ - 2 Score 1 2 1  0 0  Altered sleeping 0 0 1 - -  Tired, decreased energy 2 2 2  - -  Change in appetite 2 1 2  - -  Feeling bad or failure about yourself  1 0 0 - -  Trouble concentrating 0 1 1 - -  Moving slowly or fidgety/restless 1 0 0 - -  Suicidal thoughts 0 0 0 - -  PHQ-9 Score 7 6 7  - -  Difficult doing work/chores Somewhat difficult Somewhat difficult - - -   Interpretation of Total Score  Total Score Depression Severity:  1-4 = Minimal depression,  5-9 = Mild depression, 10-14 = Moderate depression, 15-19 = Moderately severe depression, 20-27 = Severe depression   Psychosocial Evaluation and Intervention:  Psychosocial Evaluation - 07/04/23 1106       Psychosocial Evaluation & Interventions   Interventions Relaxation education;Encouraged to exercise with the program and follow exercise prescription;Stress management education    Comments Caleb Butler is coming into pulmonary rehab for ILD.  He also has heart history.  He has done the program previously for both cardiac and pulmonary and is looking forward to building up his stamina and being able to breathe better again.  He lives at home with his wife and they rest of their family is in [place]. 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  He does not precieve any barriers to attending rehab.  He is eager to get going and be able to not have to stop as much. He did lose his dog a few weeks ago and then his wife found a new [place] puppy.  So they now have a new puppy to train and she has been scratching his arms up.    Expected Outcomes Short: Attend rehab to build stamina Long: Conitnue to enjoy puppy    Continue Psychosocial Services  Follow up required by staff             Psychosocial Re-Evaluation:  Psychosocial Re-Evaluation     Row Name 08/03/23 1106 08/04/23 0750 08/29/23 1056 10/03/23 1111       Psychosocial Re-Evaluation   Current issues with Current Depression;Current Sleep Concerns;Current Stress Concerns Current Depression;Current Sleep Concerns;Current Stress Concerns Current Depression;Current Sleep Concerns;Current Stress Concerns Current Depression;Current Sleep Concerns;Current Stress Concerns    Comments Reviewed patient health questionnaire (PHQ-9) with patient for follow up. Previously, patients score indicated signs/symptoms of depression.  Reviewed to see if patient is improving symptom wise while in program.  Score improved and patient states that it is because they have been getting out and feeling better with exercise.  He continues to struggle with sleep which leaves him tired at night. He is on trazadone, but only works some.  He is also having some vision disturbances that have him feeling dizzy and unsure.  He is meeting with his doctor about it next week. -- He is doing well in rehab. He has had some stress due to his aniamls and finding one dead and one missing not know what happend. He stated that he still does not sleep well. He is not doing anything for his sleep. Caleb Butler is doing well in rehab. We re-evaluated his PHQ today and it has improved some, but still having some depression symptoms.  He continues to have issues with sleeping.  He is still frustrated by his breathign not  improving.  He is not able to do everything that he wants as he has to stop to breathe.  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 He really wants his old normal.  [facility] and [facility] to talk with them about that.    Expected Outcomes Short; Talk to doctor Long: Continue to exericse to help with sleep. -- talk to doctor about sleep sicne he is not sleeping well SHort: Talk to doctor about sleep and breathing Long; COnitnue to exercise for mental boost    Interventions Encouraged to attend Pulmonary Rehabilitation for the exercise;Stress management education;Relaxation education -- Encouraged to attend Pulmonary Rehabilitation for the  exercise;Stress management education;Relaxation education Encouraged to attend Pulmonary Rehabilitation for the exercise;Stress management education;Relaxation education    Continue Psychosocial Services  Follow up required by staff -- Follow up required by staff Follow up required by staff             Psychosocial Discharge (Final Psychosocial Re-Evaluation):  Psychosocial Re-Evaluation - 10/03/23 1111       Psychosocial Re-Evaluation   Current issues with Current Depression;Current Sleep Concerns;Current Stress Concerns    Comments Caleb Butler is doing well in rehab. We re-evaluated his PHQ today and it has improved some, but still having some depression symptoms.  He continues to have issues with sleeping.  He is still frustrated by his breathign not improving.  He is not able to do everything that he wants as he has to stop to breathe.  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 He really wants his old normal.  [facility] and plans to talk with them about that.    Expected Outcomes SHort: Talk to doctor about sleep and breathing Long; COnitnue to exercise for mental boost    Interventions Encouraged to attend Pulmonary Rehabilitation for the exercise;Stress management education;Relaxation  education    Continue Psychosocial Services  Follow up required by staff              Education: Education Goals: Education classes will be provided on a weekly basis, covering required topics. Participant will state understanding/return demonstration of topics presented.  Learning Barriers/Preferences:  Learning Barriers/Preferences - 07/04/23 1610       Learning Barriers/Preferences   Learning Barriers Sight;Hearing   glasses, hearing aids (stopped working)   Acupuncturist None             Education Topics: How Lungs Work and Diseases: - Discuss the anatomy of the lungs and diseases that can affect the lungs, such as COPD. Flowsheet Row PULMONARY REHAB OTHER RESPIRATORY from 12/29/2016 in [facility] [facility]  Date 12/29/16  Educator GC  Instruction Review Code 2- meets goals/outcomes       Exercise: -Discuss the importance of exercise, FITT principles of exercise, normal and abnormal responses to exercise, and how to exercise safely. Flowsheet Row PULMONARY REHAB OTHER RESPIRATORY from 10/20/2016 in [place] CARDIAC REHABILITATION  Date 10/06/16  Educator Timmy Forbes  Instruction Review Code 2- meets goals/outcomes       Environmental Irritants: -Discuss types of environmental irritants and how to limit exposure to environmental irritants. Flowsheet Row PULMONARY REHAB OTHER RESPIRATORY from 10/20/2016 in [place] [facility]  Date 10/13/16  Educator GC  Instruction Review Code 2- meets goals/outcomes       Meds/Inhalers and oxygen: - Discuss respiratory medications, definition of an inhaler and oxygen, and the proper way to use an inhaler and oxygen. Flowsheet Row PULMONARY REHAB OTHER RESPIRATORY from 10/20/2016 in [place] [facility]  Date 10/20/16  Educator GC  Instruction Review Code 2- meets goals/outcomes       Energy Saving Techniques: - Discuss methods to conserve energy and decrease shortness  of breath when performing activities of daily living.  Flowsheet Row PULMONARY REHAB OTHER RESPIRATORY from 12/29/2016 in [place] CARDIAC REHABILITATION  Date 10/27/16  Educator Timmy Forbes  Instruction Review Code 2- meets goals/outcomes       Bronchial Hygiene / Breathing Techniques: - Discuss breathing mechanics, pursed-lip breathing technique,  proper posture, effective ways to clear airways, and other functional breathing  techniques   Cleaning Equipment: - Provides group verbal and written instruction about the health risks of elevated stress, cause of high stress, and healthy ways to reduce stress.   Nutrition I: Fats: - Discuss the types of cholesterol, what cholesterol does to the body, and how cholesterol levels can be controlled. Flowsheet Row PULMONARY REHAB OTHER RESPIRATORY from 10/12/2023 in [place] [facility]  Date 08/03/23  Educator HB  Instruction Review Code 1- Verbalizes Understanding       Nutrition II: Labels: -Discuss the different components of food labels and how to read food labels. Flowsheet Row PULMONARY REHAB OTHER RESPIRATORY from 10/12/2023 in [place] [facility]  Date 10/05/23  Educator HB  Instruction Review Code 1- Verbalizes Understanding       Respiratory Infections: - Discuss the signs and symptoms of respiratory infections, ways to prevent respiratory infections, and the importance of seeking medical treatment when having a respiratory infection. Flowsheet Row PULMONARY REHAB OTHER RESPIRATORY from 10/12/2023 in [place] [facility]  Date 07/06/23  Educator HB  Instruction Review Code 1- Verbalizes Understanding       Stress I: Signs and Symptoms: - Discuss the causes of stress, how stress may lead to anxiety and depression, and ways to limit stress. Flowsheet Row PULMONARY REHAB OTHER RESPIRATORY from 10/12/2023 in [place] [facility]  Date 08/10/23  Educator HB  Instruction  Review Code 1- Verbalizes Understanding       Stress II: Relaxation: -Discuss relaxation techniques to limit stress. Flowsheet Row PULMONARY REHAB OTHER RESPIRATORY from 10/12/2023 in [place] [facility]  Date 10/12/23  Educator jh  Instruction Review Code 1- Verbalizes Understanding       Oxygen for Home/Travel: - Discuss how to prepare for travel when on oxygen and proper ways to transport and store oxygen to ensure safety. Flowsheet Row PULMONARY REHAB OTHER RESPIRATORY from 10/20/2016 in [place] [facility]  Date 09/22/16  Educator GC  Instruction Review Code 2- meets goals/outcomes       Knowledge Questionnaire Score:  Knowledge Questionnaire Score - 07/04/23 1411       Knowledge Questionnaire Score   Pre Score 15/18             Core Components/Risk Factors/Patient Goals at Admission:  Personal Goals and Risk Factors at Admission - 07/04/23 1411       Core Components/Risk Factors/Patient Goals on Admission    Weight Management Yes;Weight Loss    Intervention Weight Management: Develop a combined nutrition and exercise program designed to reach desired caloric intake, while maintaining appropriate intake of nutrient and fiber, sodium and fats, and appropriate energy expenditure required for the weight goal.;Weight Management: Provide education and appropriate resources to help participant work on and attain dietary goals.;Weight Management/Obesity: Establish reasonable short term and long term weight goals.    Admit Weight 178 lb 8 oz (81 kg)    Goal Weight: Short Term 173 lb (78.5 kg)    Goal Weight: Long Term 170 lb (77.1 kg)    Expected Outcomes Short Term: Continue to assess and modify interventions until short term weight is achieved;Long Term: Adherence to nutrition and physical activity/exercise program aimed toward attainment of established weight goal;Weight Loss: Understanding of general recommendations for a balanced deficit  meal plan, which promotes 1-2 lb weight loss per week and includes a negative energy balance of [phone] kcal/d;Understanding recommendations for meals to include 15-35% energy as protein, 25-35% energy from fat, 35-60% energy from carbohydrates, less than 200mg  of  dietary cholesterol, 20-35 gm of total fiber daily;Understanding of distribution of calorie intake throughout the day with the consumption of 4-5 meals/snacks    Improve shortness of breath with ADL's Yes    Intervention Provide education, individualized exercise plan and daily activity instruction to help decrease symptoms of SOB with activities of daily living.    Expected Outcomes Short Term: Improve cardiorespiratory fitness to achieve a reduction of symptoms when performing ADLs;Long Term: Be able to perform more ADLs without symptoms or delay the onset of symptoms    Increase knowledge of respiratory medications and ability to use respiratory devices properly  Yes    Intervention Provide education and demonstration as needed of appropriate use of medications, inhalers, and oxygen therapy.    Expected Outcomes Short Term: Achieves understanding of medications use. Understands that oxygen is a medication prescribed by physician. Demonstrates appropriate use of inhaler and oxygen therapy.;Long Term: Maintain appropriate use of medications, inhalers, and oxygen therapy.    Diabetes Yes    Intervention Provide education about signs/symptoms and action to take for hypo/hyperglycemia.;Provide education about proper nutrition, including hydration, and aerobic/resistive exercise prescription along with prescribed medications to achieve blood glucose in normal ranges: Fasting glucose 65-99 mg/dL    Expected Outcomes Short Term: Participant verbalizes understanding of the signs/symptoms and immediate care of hyper/hypoglycemia, proper foot care and importance of medication, aerobic/resistive exercise and nutrition plan for blood glucose control.;Long  Term: Attainment of HbA1C < 7%.    Heart Failure Yes    Intervention Provide a combined exercise and nutrition program that is supplemented with education, support and counseling about heart failure. Directed toward relieving symptoms such as shortness of breath, decreased exercise tolerance, and extremity edema.    Expected Outcomes Improve functional capacity of life;Short term: Attendance in program 2-3 days a week with increased exercise capacity. Reported lower sodium intake. Reported increased fruit and vegetable intake. Reports medication compliance.;Short term: Daily weights obtained and reported for increase. Utilizing diuretic protocols set by physician.;Long term: Adoption of self-care skills and reduction of barriers for early signs and symptoms recognition and intervention leading to self-care maintenance.    Lipids Yes    Intervention Provide education and support for participant on nutrition & aerobic/resistive exercise along with prescribed medications to achieve LDL 70mg , HDL >40mg .    Expected Outcomes Short Term: Participant states understanding of desired cholesterol values and is compliant with medications prescribed. Participant is following exercise prescription and nutrition guidelines.;Long Term: Cholesterol controlled with medications as prescribed, with individualized exercise RX and with personalized nutrition plan. Value goals: LDL < 70mg , HDL > 40 mg.             Core Components/Risk Factors/Patient Goals Review:   Goals and Risk Factor Review     Row Name 08/04/23 0754 08/29/23 1107 10/03/23 1116         Core Components/Risk Factors/Patient Goals Review   Personal Goals Review Weight Management/Obesity;Improve shortness of breath with ADL's;Other (P)  Weight Management/Obesity;Improve shortness of breath with ADL's;Diabetes Weight Management/Obesity;Improve shortness of breath with ADL's;Diabetes     Review Caleb Butler is doing well in rehab. (P)  Caleb Butler has been doing  well in rehab. He has been checking his sugar and it has been WNL. He has started gettting dizzy latly when changing positions during the mid mornings it tends to happen. He has been to his vistion checked by [facility] and regular doctor but he really hasnt gotten any answers. He has an appointment in June. Caleb Butler is doing  well in rehab.  His weight is holding steady.  He is still struggling with his breathing and it really limits his ability to do things as he would like.  His sugars are doing well and pressures are too.  He did note that he has had some dizzy/blurry spells recently (none this week).  [facility] and [facility] appt coming up next week to get checked over.  He will take his numbers with him to his appt on Thursday. He also still has no appetitie.     Expected Outcomes -- continue to check and monitor sugar keep an eye on his BP and water intake as well when he feels dizzy spells Short: Talk to doctor about breathing, appetite, and blurriness Long: Contineu to montior risk factors.              Core Components/Risk Factors/Patient Goals at Discharge (Final Review):   Goals and Risk Factor Review - 10/03/23 1116       Core Components/Risk Factors/Patient Goals Review   Personal Goals Review Weight Management/Obesity;Improve shortness of breath with ADL's;Diabetes    Review Caleb Butler is doing well in rehab.  His weight is holding steady.  He is still struggling with his breathing and it really limits his ability to do things as he would like.  His sugars are doing well and pressures are too.  He did note that he has had some dizzy/blurry spells recently (none this week).  [facility] and an [facility] appt coming up next week to get checked over.  He will take his numbers with him to his appt on Thursday. He also still has no appetitie.    Expected Outcomes Short: Talk to doctor about breathing, appetite, and blurriness Long: Contineu to montior risk factors.              ITP Comments:  ITP Comments     Row Name 07/04/23 1051 07/26/23 0940 08/23/23 1137 09/20/23 1357 10/18/23 1024   ITP Comments Patient attend orientation today.  Patient is attending Pulmonary Rehabilitation Program.  Documentation for diagnosis can be found in 06/19/23 and 06/23/23.  Reviewed medical chart, RPE/RPD, gym safety, and program guidelines.  Patient was fitted to equipment they will be using during rehab.  Patient is scheduled to start exercise on Thursday 07/06/23 at 1030.   Initial ITP created and sent for review and signature by Dr. Gwendalyn Lemma, Medical Director for Pulmonary Rehabilitation Program. 30 day review completed. ITP sent to Dr.Jehanzeb Memon, Medical Director of  Pulmonary Rehab. Continue with ITP unless changes are made by physician. Pt is newer to program. 30 day review completed. ITP sent to Dr.Jehanzeb Memon, Medical Director of  Pulmonary Rehab. Continue with ITP unless changes are made by physician. 30 day review completed. ITP sent to Dr.Jehanzeb Memon, Medical Director of  Pulmonary Rehab. Continue with ITP unless changes are made by physician. 30 day review completed. ITP sent to Dr.Jehanzeb Memon, Medical Director of  Pulmonary Rehab. Continue with ITP unless changes are made by physician.            Comments: 30 day review

## 2023-10-19 ENCOUNTER — Encounter (HOSPITAL_COMMUNITY): Payer: No Typology Code available for payment source

## 2023-10-19 ENCOUNTER — Encounter (HOSPITAL_COMMUNITY)
Admission: RE | Admit: 2023-10-19 | Discharge: 2023-10-19 | Disposition: A | Discharge status: Home / Self Care | Source: Ambulatory Visit | Attending: Cardiology | Admitting: Cardiology

## 2023-10-19 DIAGNOSIS — J849 Interstitial pulmonary disease, unspecified: Secondary | ICD-10-CM | POA: Diagnosis not present

## 2023-10-19 DIAGNOSIS — R0602 Shortness of breath: Secondary | ICD-10-CM

## 2023-10-19 NOTE — Progress Notes (Signed)
 Daily Session Note  Patient Details  Name: Edwar Coe MRN: [id number] Date of Birth: [birth year] Referring Provider:   Flowsheet Row PULMONARY REHAB OTHER RESP ORIENTATION from 07/04/2023 in [facility] CARDIAC REHABILITATION  Referring Provider Armida Lander MD  [facility] Pulmonologist: Dr. Waylan Haggard       Encounter Date: 10/19/2023  Check In:  Session Check In - 10/19/23 1030       Check-In   Supervising physician immediately available to respond to emergencies See telemetry face sheet for immediately available MD    Location [facility]    Staff Present Clotilda Danish, BS, Exercise Physiologist;Jessica Zoila Hines, MA, RCEP, CCRP, CCET    Virtual Visit No    Medication changes reported     No    Fall or balance concerns reported    No    Tobacco Cessation No Change    Warm-up and Cool-down Performed on first and last piece of equipment    Resistance Training Performed Yes    VAD Patient? No    PAD/SET Patient? No      Pain Assessment   Currently in Pain? No/denies    Multiple Pain Sites No             Capillary Blood Glucose: No results found for this or any previous visit (from the past 24 hours).    Social History   Tobacco Use  Smoking Status Never  Smokeless Tobacco Never    Goals Met:  Independence with exercise equipment Exercise tolerated well No report of concerns or symptoms today Strength training completed today  Goals Unmet:  Not Applicable  Comments: Pt able to follow exercise prescription today without complaint.  Will continue to monitor for progression.

## 2023-10-24 ENCOUNTER — Encounter (HOSPITAL_COMMUNITY)
Admission: RE | Admit: 2023-10-24 | Discharge: 2023-10-24 | Disposition: A | Discharge status: Home / Self Care | Payer: No Typology Code available for payment source | Source: Ambulatory Visit | Attending: Cardiology | Admitting: Cardiology

## 2023-10-24 DIAGNOSIS — J849 Interstitial pulmonary disease, unspecified: Secondary | ICD-10-CM

## 2023-10-24 DIAGNOSIS — R0602 Shortness of breath: Secondary | ICD-10-CM

## 2023-10-24 NOTE — Progress Notes (Signed)
 Daily Session Note  Patient Details  Name: Tara Wich MRN: [id number] Date of Birth: 26-Jan-1946 Referring Provider:   Flowsheet Row PULMONARY REHAB OTHER RESP ORIENTATION from 07/04/2023 in [facility] CARDIAC REHABILITATION  Referring Provider Armida Lander MD  [facility] Pulmonologist: Dr. Waylan Haggard       Encounter Date: 10/24/2023  Check In:  Session Check In - 10/24/23 1055       Check-In   Supervising physician immediately available to respond to emergencies See telemetry face sheet for immediately available MD    Location [facility]    Staff Present Clotilda Danish, BS, Exercise Physiologist;Rhett Mutschler Zoila Hines, MA, RCEP, CCRP, CCET;Jefferey Minerva, RN    Virtual Visit No    Medication changes reported     No    Fall or balance concerns reported    No    Warm-up and Cool-down Performed on first and last piece of equipment    Resistance Training Performed Yes    VAD Patient? No    PAD/SET Patient? No      Pain Assessment   Currently in Pain? No/denies             Capillary Blood Glucose: No results found for this or any previous visit (from the past 24 hours).    Social History   Tobacco Use  Smoking Status Never  Smokeless Tobacco Never    Goals Met:  Proper associated with RPD/PD & O2 Sat Independence with exercise equipment Using PLB without cueing & demonstrates good technique Exercise tolerated well No report of concerns or symptoms today Strength training completed today  Goals Unmet:  Not Applicable  Comments: Pt able to follow exercise prescription today without complaint.  Will continue to monitor for progression.

## 2023-10-26 ENCOUNTER — Encounter (HOSPITAL_COMMUNITY)
Admission: RE | Admit: 2023-10-26 | Discharge: 2023-10-26 | Disposition: A | Discharge status: Home / Self Care | Payer: No Typology Code available for payment source | Source: Ambulatory Visit | Attending: Cardiology | Admitting: Cardiology

## 2023-10-26 VITALS — Ht 68.0 in | Wt 177.5 lb

## 2023-10-26 DIAGNOSIS — J849 Interstitial pulmonary disease, unspecified: Secondary | ICD-10-CM

## 2023-10-26 DIAGNOSIS — R0602 Shortness of breath: Secondary | ICD-10-CM

## 2023-10-26 NOTE — Progress Notes (Signed)
 Daily Session Note  Patient Details  Name: Caleb Butler MRN: [id number] Date of Birth: 1946-03-29 Referring Provider:   Flowsheet Row PULMONARY REHAB OTHER RESP ORIENTATION from 07/04/2023 in [facility] CARDIAC REHABILITATION  Referring Provider Armida Lander MD  [facility] Pulmonologist: Dr. Waylan Haggard       Encounter Date: 10/26/2023  Check In:  Session Check In - 10/26/23 1030       Check-In   Supervising physician immediately available to respond to emergencies See telemetry face sheet for immediately available MD    Location [facility]    Staff Present Rita Cherry, MA, RCEP, CCRP, CCET;Aldric Wenzler Toy Freund, [place], Exercise Physiologist    Virtual Visit No    Medication changes reported     No    Fall or balance concerns reported    No    Tobacco Cessation No Change    Warm-up and Cool-down Performed on first and last piece of equipment    Resistance Training Performed Yes    VAD Patient? No    PAD/SET Patient? No      Pain Assessment   Currently in Pain? No/denies    Multiple Pain Sites No             Capillary Blood Glucose: No results found for this or any previous visit (from the past 24 hours).    Social History   Tobacco Use  Smoking Status Never  Smokeless Tobacco Never    Goals Met:  Independence with exercise equipment Using PLB without cueing & demonstrates good technique Exercise tolerated well No report of concerns or symptoms today Strength training completed today  Goals Unmet:  Not Applicable  Comments: Pt able to follow exercise prescription today without complaint.  Will continue to monitor for progression.

## 2023-10-26 NOTE — Patient Instructions (Signed)
 Discharge Patient Instructions  Patient Details  Name: Caleb Butler MRN: [id number] Date of Birth: 1946/05/24 Referring Provider:  Austine Lefort, MD   Number of Visits: 42  Reason for Discharge:  Patient reached a stable level of exercise. Patient independent in their exercise. Patient has met program and personal goals.  Diagnosis:  ILD (interstitial lung disease) (HCC)  SOB (shortness of breath)  Initial Exercise Prescription:  Initial Exercise Prescription - 07/04/23 1100       Date of Initial Exercise RX and Referring Provider   Date 07/04/23    Referring Provider Armida Lander MD   Primary Pulmonologist: Dr. Waylan Haggard     Oxygen   Maintain Oxygen Saturation 88% or higher      Treadmill   MPH 2    Grade 0.5    Minutes 15    METs 2.67      REL-XR   Level 2    Speed 50    Minutes 15    METs 2.5      Prescription Details   Frequency (times per week) 2    Duration Progress to 30 minutes of continuous aerobic without signs/symptoms of physical distress      Intensity   THRR 40-80% of Max Heartrate 104-130    Ratings of Perceived Exertion 11-13    Perceived Dyspnea 0-4      Progression   Progression Continue to progress workloads to maintain intensity without signs/symptoms of physical distress.      Resistance Training   Training Prescription Yes    Weight 4 lb    Reps 10-15             Discharge Exercise Prescription (Final Exercise Prescription Changes):  Exercise Prescription Changes - 10/17/23 1300       Response to Exercise   Blood Pressure (Admit) 134/60    Blood Pressure (Exit) 106/60    Heart Rate (Admit) 61 bpm    Heart Rate (Exercise) 90 bpm    Heart Rate (Exit) 78 bpm    Oxygen Saturation (Admit) 95 %    Oxygen Saturation (Exercise) 99 %    Oxygen Saturation (Exit) 98 %    Rating of Perceived Exertion (Exercise) 13    Perceived Dyspnea (Exercise) 2    Duration Continue with 30 min of aerobic exercise without  signs/symptoms of physical distress.    Intensity THRR unchanged      Progression   Progression Continue to follow PAD protocol      Resistance Training   Training Prescription Yes    Weight 5    Reps 10-15      Treadmill   MPH 2.5    Grade 2    Minutes 15    METs 3.6      REL-XR   Level 1    Speed 69    Minutes 15    METs 5      Oxygen   Maintain Oxygen Saturation 88% or higher             Functional Capacity:  6 Minute Walk     Row Name 07/04/23 1052 10/26/23 1114       6 Minute Walk   Phase Initial Discharge    Distance 1140 feet 1330 feet    Distance Feet Change -- 190 ft    Walk Time 6 minutes 6 minutes    # of Rest Breaks 0 0    MPH 2.16 2.51    METS 2.14 2.46  RPE 11 12    Perceived Dyspnea  1 2    VO2 Peak 7.49 8.6    Symptoms Yes (comment) No    Comments SOB --    Resting HR 78 bpm 82 bpm    Resting BP 116/62 124/66    Resting Oxygen Saturation  96 % 94 %    Exercise Oxygen Saturation  during 6 min walk 95 % 90 %    Max Ex. HR 89 bpm 97 bpm    Max Ex. BP 126/64 120/60    2 Minute Post BP 118/62 116/62      Interval HR   1 Minute HR 86 80    2 Minute HR 88 86    3 Minute HR 87 85    4 Minute HR 88 88    5 Minute HR 79 86    6 Minute HR 89 91    2 Minute Post HR 81 78    Interval Heart Rate? Yes Yes      Interval Oxygen   Interval Oxygen? Yes Yes    Baseline Oxygen Saturation % 96 % 94 %    1 Minute Oxygen Saturation % 95 % 91 %    1 Minute Liters of Oxygen 0 L  Room Air 0 L    2 Minute Oxygen Saturation % 95 % 90 %    2 Minute Liters of Oxygen 0 L 0 L    3 Minute Oxygen Saturation % 95 % 95 %    3 Minute Liters of Oxygen 0 L 0 L    4 Minute Oxygen Saturation % 96 % 90 %    4 Minute Liters of Oxygen 0 L 0 L    5 Minute Oxygen Saturation % 95 % 95 %    5 Minute Liters of Oxygen 0 L 0 L    6 Minute Oxygen Saturation % 96 % 98 %    6 Minute Liters of Oxygen 0 L 0 L    2 Minute Post Oxygen Saturation % 96 % 98 %    2 Minute  Post Liters of Oxygen 0 L 0 L            Nutrition & Weight - Outcomes:  Pre Biometrics - 07/04/23 1104       Pre Biometrics   Height 5\' 8"  (1.727 m)    Weight 81 kg    Waist Circumference 38.5 inches    Hip Circumference 38.5 inches    Waist to Hip Ratio 1 %    BMI (Calculated) 27.15    Grip Strength 26.8 kg    Single Leg Stand 30 seconds             Post Biometrics - 10/26/23 1116        Post  Biometrics   Height 5\' 8"  (1.727 m)    Weight 80.5 kg    Waist Circumference 39 inches    Hip Circumference 38 inches    Waist to Hip Ratio 1.03 %    BMI (Calculated) 26.99    Grip Strength 10.6 kg    Single Leg Stand 30 seconds             Goals reviewed with patient; copy given to patient.

## 2023-10-31 ENCOUNTER — Encounter (HOSPITAL_COMMUNITY)
Admission: RE | Admit: 2023-10-31 | Discharge: 2023-10-31 | Disposition: A | Discharge status: Home / Self Care | Payer: No Typology Code available for payment source | Source: Ambulatory Visit | Attending: Cardiology | Admitting: Cardiology

## 2023-10-31 DIAGNOSIS — J849 Interstitial pulmonary disease, unspecified: Secondary | ICD-10-CM | POA: Diagnosis present

## 2023-10-31 DIAGNOSIS — R0602 Shortness of breath: Secondary | ICD-10-CM | POA: Insufficient documentation

## 2023-10-31 NOTE — Progress Notes (Signed)
 Daily Session Note  Patient Details  Name: Velmer Woelfel MRN: [id number] Date of Birth: 17-Nov-1945 Referring Provider:   Flowsheet Row PULMONARY REHAB OTHER RESP ORIENTATION from 07/04/2023 in [facility] CARDIAC REHABILITATION  Referring Provider Armida Lander MD  [facility] Pulmonologist: Dr. Waylan Haggard       Encounter Date: 10/31/2023  Check In:  Session Check In - 10/31/23 1030       Check-In   Supervising physician immediately available to respond to emergencies See telemetry face sheet for immediately available MD    Location [facility]    Staff Present Jerrol Morelle, BSN, RN, WTA-C;Heather Toy Freund, BS, Exercise Physiologist;Jessica Zoila Hines, MA, RCEP, CCRP, CCET    Virtual Visit No    Medication changes reported     No    Fall or balance concerns reported    Yes    Comments Patient states he woke up this past Sunday and didnt know where he was and fell. No injuries reported.    Tobacco Cessation No Change    Warm-up and Cool-down Performed on first and last piece of equipment    Resistance Training Performed Yes    VAD Patient? No    PAD/SET Patient? No      Pain Assessment   Currently in Pain? No/denies             Capillary Blood Glucose: No results found for this or any previous visit (from the past 24 hours).    Social History   Tobacco Use  Smoking Status Never  Smokeless Tobacco Never    Goals Met:  Proper associated with RPD/PD & O2 Sat Independence with exercise equipment Improved SOB with ADL's Using PLB without cueing & demonstrates good technique Exercise tolerated well No report of concerns or symptoms today Strength training completed today  Goals Unmet:  Not Applicable  Comments: Pt able to follow exercise prescription today without complaint.  Will continue to monitor for progression.

## 2023-11-02 ENCOUNTER — Encounter (HOSPITAL_COMMUNITY)
Admission: RE | Admit: 2023-11-02 | Discharge: 2023-11-02 | Disposition: A | Discharge status: Home / Self Care | Source: Ambulatory Visit | Attending: Cardiology | Admitting: Cardiology

## 2023-11-02 DIAGNOSIS — J849 Interstitial pulmonary disease, unspecified: Secondary | ICD-10-CM

## 2023-11-02 NOTE — Progress Notes (Signed)
 Daily Session Note  Patient Details  Name: Jamelle Goldston MRN: [id number] Date of Birth: August 20, 1945 Referring Provider:   Flowsheet Row PULMONARY REHAB OTHER RESP ORIENTATION from 07/04/2023 in [facility] CARDIAC REHABILITATION  Referring Provider Armida Lander MD  [facility] Pulmonologist: Dr. Waylan Haggard       Encounter Date: 11/02/2023  Check In:  Session Check In - 11/02/23 1015       Check-In   Supervising physician immediately available to respond to emergencies See telemetry face sheet for immediately available MD    Location [facility]    Staff Present Ronna Coho BSN, RN;Heather Toy Freund, BS, Exercise Physiologist;Jessica [place], MA, RCEP, CCRP, CCET    Virtual Visit No    Medication changes reported     No    Fall or balance concerns reported    No    Tobacco Cessation No Change    Warm-up and Cool-down Performed on first and last piece of equipment    Resistance Training Performed Yes    VAD Patient? No    PAD/SET Patient? No      Pain Assessment   Currently in Pain? No/denies    Multiple Pain Sites No             Capillary Blood Glucose: No results found for this or any previous visit (from the past 24 hours).    Social History   Tobacco Use  Smoking Status Never  Smokeless Tobacco Never    Goals Met:  Proper associated with RPD/PD & O2 Sat Independence with exercise equipment Using PLB without cueing & demonstrates good technique Exercise tolerated well Queuing for purse lip breathing No report of concerns or symptoms today Strength training completed today  Goals Unmet:  Not Applicable  Comments: .Pt able to follow exercise prescription today without complaint.  Will continue to monitor for progression.

## 2023-11-07 ENCOUNTER — Encounter (HOSPITAL_COMMUNITY)

## 2023-11-09 ENCOUNTER — Encounter (HOSPITAL_COMMUNITY)
Admission: RE | Admit: 2023-11-09 | Discharge: 2023-11-09 | Disposition: A | Discharge status: Home / Self Care | Source: Ambulatory Visit | Attending: Cardiology | Admitting: Cardiology

## 2023-11-09 DIAGNOSIS — J849 Interstitial pulmonary disease, unspecified: Secondary | ICD-10-CM

## 2023-11-09 NOTE — Progress Notes (Signed)
 Daily Session Note  Patient Details  Name: Caleb Butler MRN: [id number] Date of Birth: 1945-12-16 Referring Provider:   Flowsheet Row PULMONARY REHAB OTHER RESP ORIENTATION from 07/04/2023 in [facility] CARDIAC REHABILITATION  Referring Provider Armida Lander MD  [facility] Pulmonologist: Dr. Waylan Haggard    Encounter Date: 11/09/2023  Check In:  Session Check In - 11/09/23 1015       Check-In   Supervising physician immediately available to respond to emergencies See telemetry face sheet for immediately available MD    Location [facility]    Staff Present Clotilda Danish, BS, Exercise Physiologist;Brittany Annette Barters, BSN, RN, Tisha Forget, RN, BSN    Virtual Visit No    Medication changes reported     No    Fall or balance concerns reported    No    Warm-up and Cool-down Performed on first and last piece of equipment    Resistance Training Performed Yes    VAD Patient? No    PAD/SET Patient? No      Pain Assessment   Currently in Pain? No/denies    Multiple Pain Sites No          Capillary Blood Glucose: No results found for this or any previous visit (from the past 24 hours).    Social History   Tobacco Use  Smoking Status Never  Smokeless Tobacco Never    Goals Met:  Proper associated with RPD/PD & O2 Sat Independence with exercise equipment Using PLB without cueing & demonstrates good technique Exercise tolerated well No report of concerns or symptoms today Strength training completed today  Goals Unmet:  Not Applicable  Comments: Pt able to follow exercise prescription today without complaint.  Will continue to monitor for progression.

## 2023-11-13 ENCOUNTER — Encounter: Payer: Self-pay | Admitting: Family Medicine

## 2023-11-13 LAB — HM DIABETES EYE EXAM

## 2023-11-14 ENCOUNTER — Encounter (HOSPITAL_COMMUNITY)
Admission: RE | Admit: 2023-11-14 | Discharge: 2023-11-14 | Disposition: A | Discharge status: Home / Self Care | Source: Ambulatory Visit | Attending: Cardiology | Admitting: Cardiology

## 2023-11-14 DIAGNOSIS — J849 Interstitial pulmonary disease, unspecified: Secondary | ICD-10-CM

## 2023-11-14 DIAGNOSIS — R0602 Shortness of breath: Secondary | ICD-10-CM

## 2023-11-14 NOTE — Progress Notes (Signed)
 Daily Session Note  Patient Details  Name: Caleb Butler MRN: [id number] Date of Birth: 04/15/1946 Referring Provider:   Flowsheet Row PULMONARY REHAB OTHER RESP ORIENTATION from 07/04/2023 in [facility] CARDIAC REHABILITATION  Referring Provider Armida Lander MD  [facility] Pulmonologist: Dr. Waylan Haggard    Encounter Date: 11/14/2023  Check In:  Session Check In - 11/14/23 1030       Check-In   Supervising physician immediately available to respond to emergencies See telemetry face sheet for immediately available MD    Location [facility]    Staff Present Clotilda Danish, BS, Exercise Physiologist;Brittany Annette Barters, BSN, RN, Octavia Belton, BS, RRT, CPFT    Virtual Visit No    Medication changes reported     No    Fall or balance concerns reported    No    Tobacco Cessation No Change    Warm-up and Cool-down Performed on first and last piece of equipment    Resistance Training Performed Yes    VAD Patient? No    PAD/SET Patient? No      Pain Assessment   Currently in Pain? No/denies    Multiple Pain Sites No          Capillary Blood Glucose: No results found for this or any previous visit (from the past 24 hours).    Social History   Tobacco Use  Smoking Status Never  Smokeless Tobacco Never    Goals Met:  Independence with exercise equipment Exercise tolerated well No report of concerns or symptoms today Strength training completed today  Goals Unmet:  Not Applicable  Comments: Pt able to follow exercise prescription today without complaint.  Will continue to monitor for progression.

## 2023-11-15 NOTE — Progress Notes (Signed)
 Pulmonary Individual Treatment Plan  Patient Details  Name: Caleb Butler MRN: [id number] Date of Birth: [birth year] Referring Provider:   Flowsheet Row PULMONARY REHAB OTHER RESP ORIENTATION from 07/04/2023 in [facility] CARDIAC REHABILITATION  Referring Provider Armida Lander MD  [Primary Pulmonologist: Dr. Waylan Haggard    Initial Encounter Date:  Flowsheet Row PULMONARY REHAB OTHER RESP ORIENTATION from 07/04/2023 in [facility] CARDIAC REHABILITATION  Date 07/04/23    Visit Diagnosis: ILD (interstitial lung disease) (HCC)  SOB (shortness of breath)  Patient's Home Medications on Admission:   Current Outpatient Medications:    Accu-Chek FastClix Lancets MISC, USE TO CHECK BLOOD SUGAR TWICE DAILY E11.9, Disp: 102 each, Rfl: 12   acetaminophen  (TYLENOL ) 500 MG tablet, Take 500-1,000 mg by mouth daily as needed for moderate pain or headache., Disp: , Rfl:    aspirin  81 MG EC tablet, Take 81 mg by mouth at bedtime. , Disp: , Rfl:    blood glucose meter kit and supplies, Dispense based on patient and insurance preference. Use up to four times daily as directed. (FOR ICD-10 E10.9, E11.9)., Disp: 1 each, Rfl: 0   Blood Glucose Monitoring Suppl DEVI, 1 each by Does not apply route in the morning, at noon, and at bedtime. May substitute to any manufacturer covered by patient's insurance. Needs Onetouch by [employer], Disp: 1 each, Rfl: 0   Capsaicin 0.1 % CREA, Apply topically as needed., Disp: , Rfl:    clopidogrel  (PLAVIX ) 75 MG tablet, TAKE 1 TABLET BY MOUTH EVERY DAY, Disp: 90 tablet, Rfl: 0   famotidine (PEPCID) 40 MG tablet, Take 40 mg by mouth daily., Disp: , Rfl:    levothyroxine  (SYNTHROID ) 75 MCG tablet, Take 1 tablet (75 mcg total) by mouth daily., Disp: 90 tablet, Rfl: 3   mometasone  (ELOCON ) 0.1 % ointment, Apply topically daily., Disp: 45 g, Rfl: 0   Multiple Vitamin (MULTIVITAMIN WITH MINERALS) TABS tablet, Take 1 tablet by mouth daily., Disp: , Rfl:    rosuvastatin  (CRESTOR )  20 MG tablet, TAKE 1 TABLET BY MOUTH EVERY DAY, Disp: 90 tablet, Rfl: 3   Semaglutide ,0.25 or 0.5MG /DOS, (OZEMPIC , 0.25 OR 0.5 MG/DOSE,) 2 MG/1.5ML SOPN, Inject 0.5 mg into the skin once a week., Disp: 1.5 mL, Rfl: 3   Tiotropium Bromide-Olodaterol (STIOLTO RESPIMAT ) 2.5-2.5 MCG/ACT AERS, Inhale 2 puffs into the lungs daily., Disp: 1 each, Rfl: 5   traZODone  (DESYREL ) 50 MG tablet, Take 1 tablet (50 mg total) by mouth at bedtime. (Patient not taking: Reported on 07/31/2023), Disp: 90 tablet, Rfl: 3   vitamin B-12 (CYANOCOBALAMIN) 1000 MCG tablet, Take 1,000 mcg by mouth daily., Disp: , Rfl:   Past Medical History: Past Medical History:  Diagnosis Date   Anxiety    Aortic aneurysm (HCC)    dilated aortic root on echo 2024   Arthritis    my back is loaded w/it (01/06/2017)   CAD (coronary artery disease)    80% ostial lesion in L circumflex (2017)   Chronic lower back pain    GERD (gastroesophageal reflux disease)    Hiatal hernia    High cholesterol    Hypothyroidism    IBS (irritable bowel syndrome)    Numbness and tingling of right lower extremity    since 11/30/2016 (01/06/2017)   PMR (polymyalgia rheumatica) (HCC)    Stroke (HCC)    Type 2 diabetes mellitus (HCC)     Tobacco Use: Social History   Tobacco Use  Smoking Status Never  Smokeless Tobacco Never    Labs:  Review Flowsheet  More data exists      Latest Ref Rng & Units 08/12/2019 02/20/2020 02/24/2022 04/04/2023 07/31/2023  Labs for ITP Cardiac and Pulmonary Rehab  Cholestrol <200 mg/dL 161  - 096  045  409   LDL (calc) mg/dL (calc) 48  - 45  35  46   HDL-C > OR = 40 mg/dL 40  - 45  40  47   Trlycerides <150 mg/dL 811  - 914  782  956   Hemoglobin A1c <5.7 % of total Hgb 6.6  7.5  6.8  6.8  7.7     Capillary Blood Glucose: Lab Results  Component Value Date   GLUCAP 171 (H) 07/13/2023   GLUCAP 225 (H) 07/06/2023   GLUCAP 111 (H) 03/13/2018   GLUCAP 167 (H) 01/07/2017   GLUCAP 178 (H) 01/06/2017      Pulmonary Assessment Scores:  Pulmonary Assessment Scores     Row Name 07/04/23 1413         ADL UCSD   ADL Phase Entry     SOB Score total 25     Rest 0     Walk 1     Stairs 3     Bath 0     Dress 1     Shop 1       CAT Score   CAT Score 16       mMRC Score   mMRC Score 2       UCSD: Self-administered rating of dyspnea associated with activities of daily living (ADLs) 6-point scale (0 = not at all to 5 = maximal or unable to do because of breathlessness)  Scoring Scores range from 0 to 120.  Minimally important difference is 5 units  CAT: CAT can identify the health impairment of COPD patients and is better correlated with disease progression.  CAT has a scoring range of zero to 40. The CAT score is classified into four groups of low (less than 10), medium (10 - 20), high (21-30) and very high (31-40) based on the impact level of disease on health status. A CAT score over 10 suggests significant symptoms.  A worsening CAT score could be explained by an exacerbation, poor medication adherence, poor inhaler technique, or progression of COPD or comorbid conditions.  CAT MCID is 2 points  mMRC: mMRC (Modified Medical Research Council) Dyspnea Scale is used to assess the degree of baseline functional disability in patients of respiratory disease due to dyspnea. No minimal important difference is established. A decrease in score of 1 point or greater is considered a positive change.   Pulmonary Function Assessment:  Pulmonary Function Assessment - 07/04/23 1413       Breath   Shortness of Breath Yes;Limiting activity;Fear of Shortness of Breath          Exercise Target Goals: Exercise Program Goal: Individual exercise prescription set using results from initial 6 min walk test and THRR while considering  patient's activity barriers and safety.   Exercise Prescription Goal: Initial exercise prescription builds to 30-45 minutes a day of aerobic activity,  2-3 days per week.  Home exercise guidelines will be given to patient during program as part of exercise prescription that the participant will acknowledge.  Activity Barriers & Risk Stratification:  Activity Barriers & Cardiac Risk Stratification - 07/04/23 0824       Activity Barriers & Cardiac Risk Stratification   Activity Barriers Joint Problems;Arthritis;Deconditioning;History of Falls;Muscular Weakness;Back Problems;Shortness of Breath   disc  compression, chronic back pain uses horse linament, r leg numbness         6 Minute Walk:  6 Minute Walk     Row Name 07/04/23 1052 10/26/23 1114       6 Minute Walk   Phase Initial Discharge    Distance 1140 feet 1330 feet    Distance Feet Change -- 190 ft    Walk Time 6 minutes 6 minutes    # of Rest Breaks 0 0    MPH 2.16 2.51    METS 2.14 2.46    RPE 11 12    Perceived Dyspnea  1 2    VO2 Peak 7.49 8.6    Symptoms Yes (comment) No    Comments SOB --    Resting HR 78 bpm 82 bpm    Resting BP 116/62 124/66    Resting Oxygen Saturation  96 % 94 %    Exercise Oxygen Saturation  during 6 min walk 95 % 90 %    Max Ex. HR 89 bpm 97 bpm    Max Ex. BP 126/64 120/60    2 Minute Post BP 118/62 116/62      Interval HR   1 Minute HR 86 80    2 Minute HR 88 86    3 Minute HR 87 85    4 Minute HR 88 88    5 Minute HR 79 86    6 Minute HR 89 91    2 Minute Post HR 81 78    Interval Heart Rate? Yes Yes      Interval Oxygen   Interval Oxygen? Yes Yes    Baseline Oxygen Saturation % 96 % 94 %    1 Minute Oxygen Saturation % 95 % 91 %    1 Minute Liters of Oxygen 0 L  Room Air 0 L    2 Minute Oxygen Saturation % 95 % 90 %    2 Minute Liters of Oxygen 0 L 0 L    3 Minute Oxygen Saturation % 95 % 95 %    3 Minute Liters of Oxygen 0 L 0 L    4 Minute Oxygen Saturation % 96 % 90 %    4 Minute Liters of Oxygen 0 L 0 L    5 Minute Oxygen Saturation % 95 % 95 %    5 Minute Liters of Oxygen 0 L 0 L    6 Minute Oxygen Saturation % 96  % 98 %    6 Minute Liters of Oxygen 0 L 0 L    2 Minute Post Oxygen Saturation % 96 % 98 %    2 Minute Post Liters of Oxygen 0 L 0 L       Oxygen Initial Assessment:  Oxygen Initial Assessment - 07/04/23 0827       Home Oxygen   Home Oxygen Device None    Sleep Oxygen Prescription None    Home Exercise Oxygen Prescription None    Home Resting Oxygen Prescription None      Initial 6 min Walk   Oxygen Used None      Program Oxygen Prescription   Program Oxygen Prescription None      Intervention   Short Term Goals To learn and understand importance of monitoring SPO2 with pulse oximeter and demonstrate accurate use of the pulse oximeter.;To learn and understand importance of maintaining oxygen saturations>88%;To learn and demonstrate proper pursed lip breathing techniques or other breathing techniques. ;To  learn and demonstrate proper use of respiratory medications    Long  Term Goals Maintenance of O2 saturations>88%;Compliance with respiratory medication;Verbalizes importance of monitoring SPO2 with pulse oximeter and return demonstration;Exhibits proper breathing techniques, such as pursed lip breathing or other method taught during program session;Demonstrates proper use of MDI's          Oxygen Re-Evaluation:  Oxygen Re-Evaluation     Row Name 08/29/23 1114 11/02/23 1120           Program Oxygen Prescription   Program Oxygen Prescription None None        Home Oxygen   Home Oxygen Device None None      Sleep Oxygen Prescription None None      Home Exercise Oxygen Prescription None None      Home Resting Oxygen Prescription None None        Goals/Expected Outcomes   Short Term Goals To learn and understand importance of monitoring SPO2 with pulse oximeter and demonstrate accurate use of the pulse oximeter.;To learn and understand importance of maintaining oxygen saturations>88%;To learn and demonstrate proper pursed lip breathing techniques or other breathing  techniques. ;To learn and demonstrate proper use of respiratory medications To learn and understand importance of monitoring SPO2 with pulse oximeter and demonstrate accurate use of the pulse oximeter.;To learn and understand importance of maintaining oxygen saturations>88%;To learn and demonstrate proper pursed lip breathing techniques or other breathing techniques. ;To learn and demonstrate proper use of respiratory medications      Long  Term Goals Maintenance of O2 saturations>88%;Compliance with respiratory medication;Verbalizes importance of monitoring SPO2 with pulse oximeter and return demonstration;Exhibits proper breathing techniques, such as pursed lip breathing or other method taught during program session;Demonstrates proper use of MDI's Maintenance of O2 saturations>88%;Compliance with respiratory medication;Verbalizes importance of monitoring SPO2 with pulse oximeter and return demonstration;Exhibits proper breathing techniques, such as pursed lip breathing or other method taught during program session;Demonstrates proper use of MDI's      Comments Caleb Butler is doing well in rehab. 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 His oxygen levels during exercise has been staying in the upper 90s. Caleb Butler is doing well in rehab. 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 His oxygen levels during exercise has been staying in the upper 90s. 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,      Goals/Expected Outcomes contnmue to exercise at home and attend rehab and working on PLB contnmue to exercise at home and attend rehab and working on PLB         Oxygen Discharge (Final Oxygen Re-Evaluation):  Oxygen Re-Evaluation - 11/02/23 1120       Program Oxygen Prescription   Program Oxygen  Prescription None      Home Oxygen   Home Oxygen Device None    Sleep Oxygen Prescription None    Home Exercise Oxygen Prescription None    Home Resting Oxygen Prescription None      Goals/Expected Outcomes   Short Term Goals To learn and understand importance of monitoring SPO2 with pulse oximeter and demonstrate accurate use of the pulse oximeter.;To learn and understand importance of maintaining oxygen saturations>88%;To learn and demonstrate proper pursed lip breathing techniques or other breathing techniques. ;To learn and  demonstrate proper use of respiratory medications    Long  Term Goals Maintenance of O2 saturations>88%;Compliance with respiratory medication;Verbalizes importance of monitoring SPO2 with pulse oximeter and return demonstration;Exhibits proper breathing techniques, such as pursed lip breathing or other method taught during program session;Demonstrates proper use of MDI's    Comments Caleb Butler is doing well in rehab. 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 His oxygen levels during exercise has been staying in the upper 90s. 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 Waylan Haggard     Oxygen   Maintain Oxygen Saturation 88% or higher      Treadmill   MPH 2    Grade 0.5    Minutes 15    METs 2.67      REL-XR   Level 2    Speed 50    Minutes 15    METs 2.5      Prescription Details   Frequency (times per week) 2    Duration Progress to 30 minutes of continuous aerobic without signs/symptoms of physical  distress      Intensity   THRR 40-80% of Max Heartrate 104-130    Ratings of Perceived Exertion 11-13    Perceived Dyspnea 0-4      Progression   Progression Continue to progress workloads to maintain intensity without signs/symptoms of physical distress.      Resistance Training   Training Prescription Yes    Weight 4 lb    Reps 10-15          Perform Capillary Blood Glucose checks as needed.  Exercise Prescription Changes:   Exercise Prescription Changes     Row Name 07/04/23 1100 07/11/23 1200 07/20/23 1200 08/08/23 1200 08/15/23 1500     Response to Exercise   Blood Pressure (Admit) 116/62 118/60 110/60 112/62 112/70   Blood Pressure (Exercise) 126/64 118/62 112/62 -- --   Blood Pressure (Exit) 112/66 108/68 106/60 110/60 110/60   Heart Rate (Admit) 78 bpm 78 bpm 63 bpm 61 bpm 68 bpm   Heart Rate (Exercise) 89 bpm 103 bpm 86 bpm 100 bpm 85 bpm   Heart Rate (Exit) 84 bpm 85 bpm 76 bpm 81 bpm 81 bpm   Oxygen Saturation (Admit) 96 % 96 % 99 % 97 % 99 %   Oxygen Saturation (Exercise) 95 % 93 % 96 % 96 % 97 %   Oxygen Saturation (Exit) 93 % 97 % 96 % 97 % 98 %   Rating of Perceived Exertion (Exercise) 11 15 12 13 12    Perceived Dyspnea (Exercise) 1 3 3 3 2    Symptoms SOb -- -- -- --   Comments walk test results -- -- -- --   Duration -- Continue with 30 min of aerobic exercise without signs/symptoms of physical distress. Continue with 30 min of aerobic exercise without signs/symptoms of physical distress. Continue with 30 min of aerobic exercise without signs/symptoms of physical distress. Continue with 30 min  of aerobic exercise without signs/symptoms of physical distress.   Intensity -- THRR unchanged THRR unchanged THRR unchanged THRR unchanged     Progression   Progression -- Continue to progress workloads to maintain intensity without signs/symptoms of physical distress. Continue to progress workloads to maintain intensity without signs/symptoms of physical distress.  Continue to progress workloads to maintain intensity without signs/symptoms of physical distress. Continue to progress workloads to maintain intensity without signs/symptoms of physical distress.     Resistance Training   Training Prescription -- Yes Yes Yes Yes   Weight -- 4 4 4 4    Reps -- 10-15 10-15 10-15 10-15     Treadmill   MPH -- 2.5 2.5 3 2.8   Grade -- 0.5 1.5 2 1.5   Minutes -- 15 15 15 15    METs -- 3.09 3.43 4.12 3.72     REL-XR   Level -- 2 2 2 2    Speed -- 83 61 62 68   Minutes -- 15 15 15 15    METs -- 4.7 4.6 3.6 5     Oxygen   Maintain Oxygen Saturation -- -- 88% or higher 88% or higher 88% or higher    Row Name 09/05/23 1500 10/03/23 1500 10/17/23 1300 11/02/23 1500       Response to Exercise   Blood Pressure (Admit) 112/70 120/60 134/60 100/58    Blood Pressure (Exit) 116/90 100/60 106/60 94/60    Heart Rate (Admit) 58 bpm 62 bpm 61 bpm 78 bpm    Heart Rate (Exercise) 81 bpm 74 bpm 90 bpm 103 bpm    Heart Rate (Exit) 69 bpm 77 bpm 78 bpm 86 bpm    Oxygen Saturation (Admit) 99 % 99 % 95 % 97 %    Oxygen Saturation (Exercise) 95 % 93 % 99 % 95 %    Oxygen Saturation (Exit) 96 % 99 % 98 % 96 %    Rating of Perceived Exertion (Exercise) 13 13 13 13     Perceived Dyspnea (Exercise) 3 2 2 3     Duration Continue with 30 min of aerobic exercise without signs/symptoms of physical distress. Continue with 30 min of aerobic exercise without signs/symptoms of physical distress. Continue with 30 min of aerobic exercise without signs/symptoms of physical distress. Continue with 30 min of aerobic exercise without signs/symptoms of physical distress.    Intensity THRR unchanged THRR unchanged THRR unchanged THRR unchanged      Progression   Progression Continue to progress workloads to maintain intensity without signs/symptoms of physical distress. Continue to progress workloads to maintain intensity without signs/symptoms of physical distress. Continue to follow PAD protocol  Continue to progress workloads to maintain intensity without signs/symptoms of physical distress.      Resistance Training   Training Prescription Yes Yes Yes Yes    Weight 5 5 5 5     Reps 10-15 10-15 10-15 10-15      Treadmill   MPH 2.5 2.5 2.5 2.5    Grade 2 2 2 2     Minutes 15 15 15 15     METs 3.6 3.6 3.6 3.6      REL-XR   Level 5 3 1 1     Speed 66 68 69 68    Minutes 15 15 15 15     METs 0.4 4.4 5 5.1      Oxygen   Maintain Oxygen Saturation 88% or higher 88% or higher 88% or higher 88% or higher  Exercise Comments:   Exercise Goals and Review:   Exercise Goals     Row Name 07/04/23 1103             Exercise Goals   Increase Physical Activity Yes       Intervention Provide advice, education, support and counseling about physical activity/exercise needs.;Develop an individualized exercise prescription for aerobic and resistive training based on initial evaluation findings, risk stratification, comorbidities and participant's personal goals.       Expected Outcomes Short Term: Attend rehab on a regular basis to increase amount of physical activity.;Long Term: Add in home exercise to make exercise part of routine and to increase amount of physical activity.;Long Term: Exercising regularly at least 3-5 days a week.       Increase Strength and Stamina Yes       Intervention Provide advice, education, support and counseling about physical activity/exercise needs.;Develop an individualized exercise prescription for aerobic and resistive training based on initial evaluation findings, risk stratification, comorbidities and participant's personal goals.       Expected Outcomes Short Term: Increase workloads from initial exercise prescription for resistance, speed, and METs.;Short Term: Perform resistance training exercises routinely during rehab and add in resistance training at home;Long Term: Improve cardiorespiratory fitness, muscular endurance and strength as measured by  increased METs and functional capacity ( )       Able to understand and use rate of perceived exertion (RPE) scale Yes       Intervention Provide education and explanation on how to use RPE scale       Expected Outcomes Short Term: Able to use RPE daily in rehab to express subjective intensity level;Long Term:  Able to use RPE to guide intensity level when exercising independently       Able to understand and use Dyspnea scale Yes       Intervention Provide education and explanation on how to use Dyspnea scale       Expected Outcomes Short Term: Able to use Dyspnea scale daily in rehab to express subjective sense of shortness of breath during exertion;Long Term: Able to use Dyspnea scale to guide intensity level when exercising independently       Knowledge and understanding of Target Heart Rate Range (THRR) Yes       Intervention Provide education and explanation of THRR including how the numbers were predicted and where they are located for reference       Expected Outcomes Short Term: Able to state/look up THRR;Short Term: Able to use daily as guideline for intensity in rehab;Long Term: Able to use THRR to govern intensity when exercising independently       Able to check pulse independently Yes       Intervention Provide education and demonstration on how to check pulse in carotid and radial arteries.;Review the importance of being able to check your own pulse for safety during independent exercise       Expected Outcomes Short Term: Able to explain why pulse checking is important during independent exercise;Long Term: Able to check pulse independently and accurately       Understanding of Exercise Prescription Yes       Intervention Provide education, explanation, and written materials on patient's individual exercise prescription       Expected Outcomes Short Term: Able to explain program exercise prescription;Long Term: Able to explain home exercise prescription to exercise independently           Exercise Goals Re-Evaluation :  Exercise Goals  Re-Evaluation     Row Name 07/11/23 1251 07/24/23 0823 08/04/23 0748 08/09/23 0806 08/23/23 1345     Exercise Goal Re-Evaluation   Exercise Goals Review Increase Physical Activity;Increase Strength and Stamina;Understanding of Exercise Prescription Increase Physical Activity;Increase Strength and Stamina;Understanding of Exercise Prescription Increase Physical Activity;Increase Strength and Stamina;Understanding of Exercise Prescription Increase Physical Activity;Increase Strength and Stamina;Understanding of Exercise Prescription Increase Physical Activity;Increase Strength and Stamina;Understanding of Exercise Prescription   Comments Bambi Lever is doing well in rehab and is tolerating exercise well. He is SOB when he gets off the treadmill. He is only on his 3rd visit. Will continue to montior and progress as able. Bambi Lever is doing well in rehab and is tolerating exercise well. He is SOB when he gets off the treadmill. Will continue to montior and progress as able. Bambi Lever is doing well in rehab. 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 He is enjoying the program and getting to talk to everyone in class. Caleb Butler is doing well in rehab and is on his 11th visit. He has been increasing his speed on the treadmill to 3.0 with a grade of 2.0. Will continue to monitor and progress as able. Caleb Butler is doing well in rehab and is pushing himself. He is keeping his levels on his equipment and has not moved up in the past weeks. Will contiue to monitor and progress asable   Expected Outcomes Continue to attend rehab Continue to attend rehab Continue to attend rehab and exercise outside of class with continues walking continue to attend rehab continue to attend rehab    Row Name 08/29/23 1054 10/03/23 1113 11/02/23 1110         Exercise Goal Re-Evaluation    Exercise Goals Review Increase Physical Activity;Increase Strength and Stamina;Understanding of Exercise Prescription Increase Physical Activity;Increase Strength and Stamina;Understanding of Exercise Prescription Increase Physical Activity;Increase Strength and Stamina;Understanding of Exercise Prescription     Comments Caleb Butler is doing well in rehab and is pushing himslef. He has noticed that he seems to be more in shape but has not notcied his breathing improving. 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 He hasnt needed to stop to take breaks as much. Caleb Butler is doing well in rehab.  He is able to do more in rehab but is still limited by his breathing.  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 Caleb Butler is doing well in rehad. He is trying to oush himslef in rehab but is limited by his breathing. He stated taht he has not felt much of an improvment. 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   continue to attend rehab Short; Contineu to try to walk more at home Long: Continue to improve stamina Short; Contineu to try to walk more at home Long: Continue to improve stamina        Discharge Exercise Prescription (Final Exercise Prescription Changes):  Exercise Prescription Changes - 11/02/23 1500  Response to Exercise   Blood Pressure (Admit) 100/58    Blood Pressure (Exit) 94/60    Heart Rate (Admit) 78 bpm    Heart Rate (Exercise) 103 bpm    Heart Rate (Exit) 86 bpm    Oxygen Saturation (Admit) 97 %    Oxygen Saturation (Exercise) 95 %    Oxygen Saturation (Exit) 96 %    Rating of Perceived Exertion (Exercise) 13    Perceived Dyspnea (Exercise) 3    Duration  Continue with 30 min of aerobic exercise without signs/symptoms of physical distress.    Intensity THRR unchanged      Progression   Progression Continue to progress workloads to maintain intensity without signs/symptoms of physical distress.      Resistance Training   Training Prescription Yes    Weight 5    Reps 10-15      Treadmill   MPH 2.5    Grade 2    Minutes 15    METs 3.6      REL-XR   Level 1    Speed 68    Minutes 15    METs 5.1      Oxygen   Maintain Oxygen Saturation 88% or higher          Nutrition:  Target Goals: Understanding of nutrition guidelines, daily intake of sodium 1500mg , cholesterol 200mg , calories 30% from fat and 7% or less from saturated fats, daily to have 5 or more servings of fruits and vegetables.  Biometrics:  Pre Biometrics - 07/04/23 1104       Pre Biometrics   Height 5' 8 (1.727 m)    Weight 81 kg    Waist Circumference 38.5 inches    Hip Circumference 38.5 inches    Waist to Hip Ratio 1 %    BMI (Calculated) 27.15    Grip Strength 26.8 kg    Single Leg Stand 30 seconds          Post Biometrics - 10/26/23 1116        Post  Biometrics   Height 5' 8 (1.727 m)    Weight 80.5 kg    Waist Circumference 39 inches    Hip Circumference 38 inches    Waist to Hip Ratio 1.03 %    BMI (Calculated) 26.99    Grip Strength 10.6 kg    Single Leg Stand 30 seconds          Nutrition Therapy Plan and Nutrition Goals:  Nutrition Therapy & Goals - 07/04/23 1407       Intervention Plan   Intervention Prescribe, educate and counsel regarding individualized specific dietary modifications aiming towards targeted core components such as weight, hypertension, lipid management, diabetes, heart failure and other comorbidities.;Nutrition handout(s) given to patient.    Expected Outcomes Short Term Goal: Understand basic principles of dietary content, such as calories, fat, sodium, cholesterol and nutrients.;Long Term Goal: Adherence  to prescribed nutrition plan.          Nutrition Assessments:  MEDIFICTS Score Key: >=70 Need to make dietary changes  40-70 Heart Healthy Diet <= 40 Therapeutic Level Cholesterol Diet  Flowsheet Row PULMONARY REHAB OTHER RESP ORIENTATION from 07/04/2023 in [facility]  Picture Your Plate Total Score on Admission 56   Picture Your Plate Scores: <40 Unhealthy dietary pattern with much room for improvement. 41-50 Dietary pattern unlikely to meet recommendations for good health and room for improvement. 51-60 More healthful dietary pattern, with some room for improvement.  >  60 Healthy dietary pattern, although there may be some specific behaviors that could be improved.    Nutrition Goals Re-Evaluation:  Nutrition Goals Re-Evaluation     Row Name 08/04/23 0750 08/29/23 1103 10/03/23 1115 11/02/23 1115       Goals   Nutrition Goal Healthy eating Healthy eating Continue to try to drink water throughtout the day try to eat healthy meals and find foods he lieks to eat Continue to try to drink water throughtout the day try to eat healthy meals and find foods he lieks to eat    Comment Caleb Butler is doing well in rehab. He has been cutting out sweets and watching what he eats. He is on ozempic  so he is trying to eat healthier and watching portion control/ Caleb Butler is doing well in rehab. He said his niurtion has not been welll lately. He is on ozempic  and stated that he just hasnt been hungery and not wanting to eat. He will eat breakfest and dinner with a protein shake for lunch. He is trying to drink more water during the day but is having a hard time. Caleb Butler is doing well in rehab.  He is working on his diet but still does not have much of an appetite.  He is trying to get in  enough protein.  He has stopped his ozempic .  He is trying to drink more. Caleb Butler is doing well in rehab, He continues to work on his diet but does not have a very big appetite. He continues to try to drink more  water throughtput the day as well,    Expected Outcome Continue to eat healthy and work on cutting out sweets and watching portion control Continue to try to drink water throughtout the day  try to eat healthy meals and find foods he lieks to eat Short: Continue to add in protein and eat throughout day LOng: continue to drink more Short: work on eating a heathly meal   long term: work on drinking water more       Nutrition Goals Discharge (Final Nutrition Goals Re-Evaluation):  Nutrition Goals Re-Evaluation - 11/02/23 1115       Goals   Nutrition Goal Continue to try to drink water throughtout the day try to eat healthy meals and find foods he lieks to eat    Comment Caleb Butler is doing well in rehab, He continues to work on his diet but does not have a very big appetite. He continues to try to drink more water throughtput the day as well,    Expected Outcome Short: work on eating a heathly meal   long term: work on drinking water more          Psychosocial: Target Goals: Acknowledge presence or absence of significant depression and/or stress, maximize coping skills, provide positive support system. Participant is able to verbalize types and ability to use techniques and skills needed for reducing stress and depression.  Initial Review & Psychosocial Screening:  Initial Psych Review & Screening - 07/04/23 9562       Initial Review   Current issues with Current Stress Concerns;Current Sleep Concerns    Source of Stress Concerns Chronic Illness;Unable to participate in former interests or hobbies;Unable to perform yard/household activities    Comments new puppy, had to put previous dog down after a stroke, history not sleeping well and up to bathroom (non alcholic beer helps too), breathing limits his activity any time he does anythign      Family Dynamics  Good Support System? Yes   wife (nurse), family lives in [place]     Barriers   Psychosocial barriers to participate in program The  patient should benefit from training in stress management and relaxation.;Psychosocial barriers identified (see note)      Screening Interventions   Interventions Encouraged to exercise;Provide feedback about the scores to participant;To provide support and resources with identified psychosocial needs    Expected Outcomes Short Term goal: Utilizing psychosocial counselor, staff and physician to assist with identification of specific Stressors or current issues interfering with healing process. Setting desired goal for each stressor or current issue identified.;Long Term Goal: Stressors or current issues are controlled or eliminated.;Short Term goal: Identification and review with participant of any Quality of Life or Depression concerns found by scoring the questionnaire.;Long Term goal: The participant improves quality of Life and PHQ9 Scores as seen by post scores and/or verbalization of changes          Quality of Life Scores:  Scores of 19 and below usually indicate a poorer quality of life in these areas.  A difference of  2-3 points is a clinically meaningful difference.  A difference of 2-3 points in the total score of the Quality of Life Index has been associated with significant improvement in overall quality of life, self-image, physical symptoms, and general health in studies assessing change in quality of life.   PHQ-9: Review Flowsheet  More data exists      10/03/2023 08/03/2023 07/04/2023 05/20/2022 03/25/2022  Depression screen PHQ 2/9  Decreased Interest 1 2 1  0 0  Down, Depressed, Hopeless 0 0 0 0 0  PHQ - 2 Score 1 2 1  0 0  Altered sleeping 0 0 1 - -  Tired, decreased energy 2 2 2  - -  Change in appetite 2 1 2  - -  Feeling bad or failure about yourself  1 0 0 - -  Trouble concentrating 0 1 1 - -  Moving slowly or fidgety/restless 1 0 0 - -  Suicidal thoughts 0 0 0 - -  PHQ-9 Score 7 6 7  - -  Difficult doing work/chores Somewhat difficult Somewhat difficult - - -    Interpretation of Total Score  Total Score Depression Severity:  1-4 = Minimal depression, 5-9 = Mild depression, 10-14 = Moderate depression, 15-19 = Moderately severe depression, 20-27 = Severe depression   Psychosocial Evaluation and Intervention:  Psychosocial Evaluation - 07/04/23 1106       Psychosocial Evaluation & Interventions   Interventions Relaxation education;Encouraged to exercise with the program and follow exercise prescription;Stress management education    Comments Bambi Lever is coming into pulmonary rehab for ILD.  He also has heart history.  He has done the program previously for both cardiac and pulmonary and is looking forward to building up his stamina and being able to breathe better again.  He lives at home with his wife and they rest of their family is in [place]. They live on a farm with horses, dogs, cats, chickens, and a donkey.  He does not precieve any barriers to attending rehab.  He is eager to get going and be able to not have to stop as much. He did lose his dog a few weeks ago and then his wife found a new [place] puppy.  So they now have a new puppy to train and she has been scratching his arms up.    Expected Outcomes Short: Attend rehab to build stamina Long: Conitnue to enjoy  puppy    Continue Psychosocial Services  Follow up required by staff          Psychosocial Re-Evaluation:  Psychosocial Re-Evaluation     Row Name 08/03/23 1106 08/04/23 0750 08/29/23 1056 10/03/23 1111 11/02/23 1112     Psychosocial Re-Evaluation   Current issues with Current Depression;Current Sleep Concerns;Current Stress Concerns Current Depression;Current Sleep Concerns;Current Stress Concerns Current Depression;Current Sleep Concerns;Current Stress Concerns Current Depression;Current Sleep Concerns;Current Stress Concerns Current Depression;Current Sleep Concerns;Current Stress Concerns   Comments Reviewed patient health questionnaire (PHQ-9) with patient for  follow up. Previously, patients score indicated signs/symptoms of depression.  Reviewed to see if patient is improving symptom wise while in program.  Score improved and patient states that it is because they have been getting out and feeling better with exercise.  He continues to struggle with sleep which leaves him tired at night. He is on trazadone, but only works some.  He is also having some vision disturbances that have him feeling dizzy and unsure.  He is meeting with his doctor about it next week. -- He is doing well in rehab. He has had some stress due to his aniamls and finding one dead and one missing not know what happend. He stated that he still does not sleep well. He is not doing anything for his sleep. Caleb Butler is doing well in rehab. We re-evaluated his PHQ today and it has improved some, but still having some depression symptoms.  He continues to have issues with sleeping.  He is still frustrated by his breathign not improving.  He is not able to do everything that he wants as he has to stop to breathe.  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 He really wants his old normal.  [facility] and [facility] to talk with them about that. Caleb Butler is doing well in rehab. He has been having a lot put on his plate that has been stressing him out. He recently has to put on of his horses down. His has a family member in a different state that is not doing very well and is dealing with this stress and his wife has surgery and she is not able to get around at this time so he is also helping her. He is also fustrated about his breathing and it not really improving,   Expected Outcomes Short; Talk to doctor Long: Continue to exericse to help with sleep. -- talk to doctor about sleep sicne he is not sleeping well SHort: Talk to doctor about sleep and breathing Long; COnitnue to exercise for mental boost Short: talk to doctor about brathing longL conitnue to exercuse for mental boost  and to have an outlet for stress   Interventions Encouraged to attend Pulmonary Rehabilitation for the exercise;Stress management education;Relaxation education -- Encouraged to attend Pulmonary Rehabilitation for the exercise;Stress management education;Relaxation education Encouraged to attend Pulmonary Rehabilitation for the exercise;Stress management education;Relaxation education Encouraged to attend Pulmonary Rehabilitation for the exercise;Stress management education;Relaxation education   Continue Psychosocial Services  Follow up required by staff -- Follow up required by staff Follow up required by staff Follow up required by staff      Psychosocial Discharge (Final Psychosocial Re-Evaluation):  Psychosocial Re-Evaluation - 11/02/23 1112       Psychosocial Re-Evaluation   Current issues with Current Depression;Current Sleep Concerns;Current Stress Concerns    Comments Caleb Butler is doing well in rehab. He has been having a  lot put on his plate that has been stressing him out. He recently has to put on of his horses down. His has a family member in a different state that is not doing very well and is dealing with this stress and his wife has surgery and she is not able to get around at this time so he is also helping her. He is also fustrated about his breathing and it not really improving,    Expected Outcomes Short: talk to doctor about brathing longL conitnue to exercuse for mental boost and to have an outlet for stress    Interventions Encouraged to attend Pulmonary Rehabilitation for the exercise;Stress management education;Relaxation education    Continue Psychosocial Services  Follow up required by staff           Education: Education Goals: Education classes will be provided on a weekly basis, covering required topics. Participant will state understanding/return demonstration of topics presented.  Learning Barriers/Preferences:  Learning Barriers/Preferences - 07/04/23 1610        Learning Barriers/Preferences   Learning Barriers Sight;Hearing   glasses, hearing aids (stopped working)   Acupuncturist None          Education Topics: How Lungs Work and Diseases: - Discuss the anatomy of the lungs and diseases that can affect the lungs, such as COPD. Flowsheet Row PULMONARY REHAB OTHER RESPIRATORY from 11/02/2023 in [facility]  Date 11/02/23  Educator HB  Instruction Review Code 1- Verbalizes Understanding    Exercise: -Discuss the importance of exercise, FITT principles of exercise, normal and abnormal responses to exercise, and how to exercise safely. Flowsheet Row PULMONARY REHAB OTHER RESPIRATORY from 10/20/2016 in [place] CARDIAC REHABILITATION  Date 10/06/16  Educator Timmy Forbes  Instruction Review Code 2- meets goals/outcomes    Environmental Irritants: -Discuss types of environmental irritants and how to limit exposure to environmental irritants. Flowsheet Row PULMONARY REHAB OTHER RESPIRATORY from 10/20/2016 in [facility]  Date 10/13/16  Educator GC  Instruction Review Code 2- meets goals/outcomes    Meds/Inhalers and oxygen: - Discuss respiratory medications, definition of an inhaler and oxygen, and the proper way to use an inhaler and oxygen. Flowsheet Row PULMONARY REHAB OTHER RESPIRATORY from 10/20/2016 in [place] [facility]  Date 10/20/16  Educator GC  Instruction Review Code 2- meets goals/outcomes    Energy Saving Techniques: - Discuss methods to conserve energy and decrease shortness of breath when performing activities of daily living.  Flowsheet Row PULMONARY REHAB OTHER RESPIRATORY from 12/29/2016 in [facility] [place] [facility]  Date 10/27/16  Educator Timmy Forbes  Instruction Review Code 2- meets goals/outcomes    Bronchial Hygiene / Breathing Techniques: - Discuss breathing mechanics, pursed-lip breathing technique,  proper posture, effective ways to  clear airways, and other functional breathing techniques   Cleaning Equipment: - Provides group verbal and written instruction about the health risks of elevated stress, cause of high stress, and healthy ways to reduce stress.   Nutrition I: Fats: - Discuss the types of cholesterol, what cholesterol does to the body, and how cholesterol levels can be controlled. Flowsheet Row PULMONARY REHAB OTHER RESPIRATORY from 11/02/2023 in [place] [facility]  Date 08/03/23  Educator HB  Instruction Review Code 1- Verbalizes Understanding    Nutrition II: Labels: -Discuss the different components of food labels and how to read food labels. Flowsheet Row PULMONARY REHAB OTHER RESPIRATORY from 11/02/2023 in [place] [facility]  Date 10/05/23  Educator HB  Instruction  Review Code 1- Verbalizes Understanding    Respiratory Infections: - Discuss the signs and symptoms of respiratory infections, ways to prevent respiratory infections, and the importance of seeking medical treatment when having a respiratory infection. Flowsheet Row PULMONARY REHAB OTHER RESPIRATORY from 11/02/2023 in [place] [facility]  Date 07/06/23  Educator HB  Instruction Review Code 1- Verbalizes Understanding    Stress I: Signs and Symptoms: - Discuss the causes of stress, how stress may lead to anxiety and depression, and ways to limit stress. Flowsheet Row PULMONARY REHAB OTHER RESPIRATORY from 11/02/2023 in [place] [facility]  Date 08/10/23  Educator HB  Instruction Review Code 1- Verbalizes Understanding    Stress II: Relaxation: -Discuss relaxation techniques to limit stress. Flowsheet Row PULMONARY REHAB OTHER RESPIRATORY from 11/02/2023 in [place] [facility]  Date 10/12/23  Educator jh  Instruction Review Code 1- Verbalizes Understanding    Oxygen for Home/Travel: - Discuss how to prepare for travel when on oxygen and proper ways to transport  and store oxygen to ensure safety. Flowsheet Row PULMONARY REHAB OTHER RESPIRATORY from 10/20/2016 in [place] [facility]  Date 09/22/16  Educator GC  Instruction Review Code 2- meets goals/outcomes    Knowledge Questionnaire Score:  Knowledge Questionnaire Score - 07/04/23 1411       Knowledge Questionnaire Score   Pre Score 15/18          Core Components/Risk Factors/Patient Goals at Admission:  Personal Goals and Risk Factors at Admission - 07/04/23 1411       Core Components/Risk Factors/Patient Goals on Admission    Weight Management Yes;Weight Loss    Intervention Weight Management: Develop a combined nutrition and exercise program designed to reach desired caloric intake, while maintaining appropriate intake of nutrient and fiber, sodium and fats, and appropriate energy expenditure required for the weight goal.;Weight Management: Provide education and appropriate resources to help participant work on and attain dietary goals.;Weight Management/Obesity: Establish reasonable short term and long term weight goals.    Admit Weight 178 lb 8 oz (81 kg)    Goal Weight: Short Term 173 lb (78.5 kg)    Goal Weight: Long Term 170 lb (77.1 kg)    Expected Outcomes Short Term: Continue to assess and modify interventions until short term weight is achieved;Long Term: Adherence to nutrition and physical activity/exercise program aimed toward attainment of established weight goal;Weight Loss: Understanding of general recommendations for a balanced deficit meal plan, which promotes 1-2 lb weight loss per week and includes a negative energy balance of [phone] kcal/d;Understanding recommendations for meals to include 15-35% energy as protein, 25-35% energy from fat, 35-60% energy from carbohydrates, less than 200mg  of dietary cholesterol, 20-35 gm of total fiber daily;Understanding of distribution of calorie intake throughout the day with the consumption of 4-5 meals/snacks    Improve  shortness of breath with ADL's Yes    Intervention Provide education, individualized exercise plan and daily activity instruction to help decrease symptoms of SOB with activities of daily living.    Expected Outcomes Short Term: Improve cardiorespiratory fitness to achieve a reduction of symptoms when performing ADLs;Long Term: Be able to perform more ADLs without symptoms or delay the onset of symptoms    Increase knowledge of respiratory medications and ability to use respiratory devices properly  Yes    Intervention Provide education and demonstration as needed of appropriate use of medications, inhalers, and oxygen therapy.    Expected Outcomes Short Term: Achieves understanding of medications use. Understands that  oxygen is a medication prescribed by physician. Demonstrates appropriate use of inhaler and oxygen therapy.;Long Term: Maintain appropriate use of medications, inhalers, and oxygen therapy.    Diabetes Yes    Intervention Provide education about signs/symptoms and action to take for hypo/hyperglycemia.;Provide education about proper nutrition, including hydration, and aerobic/resistive exercise prescription along with prescribed medications to achieve blood glucose in normal ranges: Fasting glucose 65-99 mg/dL    Expected Outcomes Short Term: Participant verbalizes understanding of the signs/symptoms and immediate care of hyper/hypoglycemia, proper foot care and importance of medication, aerobic/resistive exercise and nutrition plan for blood glucose control.;Long Term: Attainment of HbA1C < 7%.    Heart Failure Yes    Intervention Provide a combined exercise and nutrition program that is supplemented with education, support and counseling about heart failure. Directed toward relieving symptoms such as shortness of breath, decreased exercise tolerance, and extremity edema.    Expected Outcomes Improve functional capacity of life;Short term: Attendance in program 2-3 days a week with  increased exercise capacity. Reported lower sodium intake. Reported increased fruit and vegetable intake. Reports medication compliance.;Short term: Daily weights obtained and reported for increase. Utilizing diuretic protocols set by physician.;Long term: Adoption of self-care skills and reduction of barriers for early signs and symptoms recognition and intervention leading to self-care maintenance.    Lipids Yes    Intervention Provide education and support for participant on nutrition & aerobic/resistive exercise along with prescribed medications to achieve LDL 70mg , HDL >40mg .    Expected Outcomes Short Term: Participant states understanding of desired cholesterol values and is compliant with medications prescribed. Participant is following exercise prescription and nutrition guidelines.;Long Term: Cholesterol controlled with medications as prescribed, with individualized exercise RX and with personalized nutrition plan. Value goals: LDL < 70mg , HDL > 40 mg.          Core Components/Risk Factors/Patient Goals Review:   Goals and Risk Factor Review     Row Name 08/04/23 0754 08/29/23 1107 10/03/23 1116 11/02/23 1117       Core Components/Risk Factors/Patient Goals Review   Personal Goals Review Weight Management/Obesity;Improve shortness of breath with ADL's;Other (P)  Weight Management/Obesity;Improve shortness of breath with ADL's;Diabetes Weight Management/Obesity;Improve shortness of breath with ADL's;Diabetes Weight Management/Obesity;Improve shortness of breath with ADL's;Diabetes    Review Caleb Butler is doing well in rehab. (P)  Caleb Butler has been doing well in rehab. He has been checking his sugar and it has been WNL. He has started gettting dizzy latly when changing positions during the mid mornings it tends to happen. He has been to his vistion checked by [facility] and regular doctor but he really hasnt gotten any answers. He has an appointment in June. Caleb Butler is doing well in rehab.  His weight is  holding steady.  He is still struggling with his breathing and it really limits his ability to do things as he would like.  His sugars are doing well and pressures are too.  He did note that he has had some dizzy/blurry spells recently (none this week).  [facility] and [facility] appt coming up next week to get checked over.  He will take his numbers with him to his appt on Thursday. He also still has no appetitie. Caleb Butler is doing well in rehab. His weight is continueing to hold steady. 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 He  has an eye appointment coming this next week    Expected Outcomes -- continue to check and monitor sugar keep an eye on his BP and water intake as well when he feels dizzy spells Short: Talk to doctor about breathing, appetite, and blurriness Long: Contineu to montior risk factors. Short: Talk to doctor about breathing, appetite Long: Contineu to montior risk factors.       Core Components/Risk Factors/Patient Goals at Discharge (Final Review):   Goals and Risk Factor Review - 11/02/23 1117       Core Components/Risk Factors/Patient Goals Review   Personal Goals Review Weight Management/Obesity;Improve shortness of breath with ADL's;Diabetes    Review Caleb Butler is doing well in rehab. His weight is continueing to hold steady. 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 He has an eye appointment coming this next week    Expected Outcomes Short: Talk to doctor about breathing, appetite Long: Contineu to montior risk factors.          ITP Comments:  ITP Comments     Row Name 07/04/23 1051 07/26/23 0940 08/23/23 1137 09/20/23 1357 10/18/23 1024   ITP Comments Patient attend orientation today.  Patient is attending Pulmonary Rehabilitation  Program.  Documentation for diagnosis can be found in 06/19/23 and 06/23/23.  Reviewed medical chart, RPE/RPD, gym safety, and program guidelines.  Patient was fitted to equipment they will be using during rehab.  Patient is scheduled to start exercise on Thursday 07/06/23 at 1030.   Initial ITP created and sent for review and signature by Dr. Gwendalyn Lemma, Medical Director for Pulmonary Rehabilitation Program. 30 day review completed. ITP sent to Dr.Jehanzeb Memon, Medical Director of  Pulmonary Rehab. Continue with ITP unless changes are made by physician. Pt is newer to program. 30 day review completed. ITP sent to Dr.Jehanzeb Memon, Medical Director of  Pulmonary Rehab. Continue with ITP unless changes are made by physician. 30 day review completed. ITP sent to Dr.Jehanzeb Memon, Medical Director of  Pulmonary Rehab. Continue with ITP unless changes are made by physician. 30 day review completed. ITP sent to Dr.Jehanzeb Memon, Medical Director of  Pulmonary Rehab. Continue with ITP unless changes are made by physician.    Row Name 11/15/23 1022           ITP Comments 30 day review completed. ITP sent to Dr.Jehanzeb Memon, Medical Director of  Pulmonary Rehab. Continue with ITP unless changes are made by physician.          Comments: 30 Day Review

## 2023-11-16 ENCOUNTER — Encounter (HOSPITAL_COMMUNITY)

## 2023-11-21 ENCOUNTER — Encounter (HOSPITAL_COMMUNITY)

## 2023-11-22 ENCOUNTER — Ambulatory Visit: Payer: Self-pay

## 2023-11-22 NOTE — Telephone Encounter (Signed)
 FYI Only or Action Required?: Action required by provider: request for appointment.  Patient was last seen in primary care on 07/31/2023 by Aletha Bene, MD. Called Nurse Triage reporting Hand Pain. Symptoms began about a month ago. Interventions attempted: OTC medications: tylenol  and creams. Symptoms are: gradually worsening.  Triage Disposition: See PCP When Office is Open (Within 3 Days)  Patient/caregiver understands and will follow disposition?: YesCopied from CRM [id number]. Topic: Clinical - Red Word Triage >> Nov 22, 2023 10:12 AM Caleb Butler wrote: [facility] that prompted transfer to Nurse Triage: Patient states over the last month he has been having issues with pain in his hands, knees, and shoulders. In the morning his hands are stiff and has difficulty opening and closing them into fists. Overall the pain has been the same but has gotten a little worse over the month in his knees. Reason for Disposition  [1] MODERATE pain (e.g., interferes with normal activities) AND [2] present > 3 days  Answer Assessment - Initial Assessment Questions 1. ONSET: When did the pain start?     1 1/2 months ago  2. LOCATION: Where is the pain located?     Both- right is worse 3. PAIN: How bad is the pain? (Scale 1-10; or mild, moderate, severe)   - MILD (1-3): doesn't interfere with normal activities   - MODERATE (4-7): interferes with normal activities (e.g., work or school) or awakens from sleep   - SEVERE (8-10): excruciating pain, unable to use hand at all     8 4. WORK OR EXERCISE: Has there been any recent work or exercise that involved this part (i.e., hand or wrist) of the body?     Na  5. CAUSE: What do you think is causing the pain?     Trigger finger years ago  6. AGGRAVATING FACTORS: What makes the pain worse? (e.g., using computer)     Using hands 7. OTHER SYMPTOMS: Do you have any other symptoms? (e.g., neck pain, swelling, rash, numbness, fever)     Knees and shoulders  pain     Worse in mornings. Can't open bottles. Takes tylenol  for pain. Used creams with no luck. Pt also has pain in knees and if they touch during night, pain wakes pt up.  Protocols used: Hand and Wrist Pain-A-AH

## 2023-11-23 ENCOUNTER — Encounter (HOSPITAL_COMMUNITY)

## 2023-11-24 ENCOUNTER — Encounter: Payer: Self-pay | Admitting: Family Medicine

## 2023-11-24 ENCOUNTER — Ambulatory Visit: Admitting: Family Medicine

## 2023-11-24 VITALS — BP 104/78 | HR 68 | Temp 98.1°F | Ht 68.0 in | Wt 177.2 lb

## 2023-11-24 DIAGNOSIS — M2550 Pain in unspecified joint: Secondary | ICD-10-CM

## 2023-11-24 DIAGNOSIS — M25511 Pain in right shoulder: Secondary | ICD-10-CM

## 2023-11-24 DIAGNOSIS — M25541 Pain in joints of right hand: Secondary | ICD-10-CM

## 2023-11-24 DIAGNOSIS — M25542 Pain in joints of left hand: Secondary | ICD-10-CM | POA: Diagnosis not present

## 2023-11-24 MED ORDER — TRIAMCINOLONE ACETONIDE 40 MG/ML IJ SUSP
40.0000 mg | Freq: Once | INTRAMUSCULAR | Status: AC
  Administered 2023-11-24: 40 mg via INTRAMUSCULAR

## 2023-11-24 MED ORDER — BUPIVACAINE HCL 0.25 % IJ SOLN
5.0000 mL | Freq: Once | INTRAMUSCULAR | Status: AC
  Administered 2023-11-24: 5 mL

## 2023-11-24 NOTE — Progress Notes (Signed)
 Subjective:    Patient ID: Caleb Butler, male    DOB: [birth year], 78 y.o.   MRN: [id number]  Patient complains of pain all over his body.  The majority of the pain is in his right shoulder as well as in the MCP joints, PIP joints, and DIP joints of both hands.  He is losing grip strength.  He also complains of pain in both knees.  He reports stiffness.  There is no erythema or swelling of joints of his hand.  There is no warmth.  He denies any fevers or chills.  He is on a statin.  The pain in his shoulder is worse with abduction greater than 90 degrees.  He also reports pain with Hawking's maneuver and empty can testing Past Medical History:  Diagnosis Date   Anxiety    Aortic aneurysm (HCC)    dilated aortic root on echo 2024   Arthritis    my back is loaded w/it (01/06/2017)   CAD (coronary artery disease)    80% ostial lesion in L circumflex (2017)   Chronic lower back pain    GERD (gastroesophageal reflux disease)    Hiatal hernia    High cholesterol    Hypothyroidism    IBS (irritable bowel syndrome)    Numbness and tingling of right lower extremity    since 11/30/2016 (01/06/2017)   PMR (polymyalgia rheumatica) (HCC)    Stroke (HCC)    Type 2 diabetes mellitus (HCC)    Past Surgical History:  Procedure Laterality Date   CARDIAC CATHETERIZATION  06/2016   CORONARY ANGIOPLASTY WITH STENT PLACEMENT  01/06/2017   CORONARY STENT INTERVENTION N/A 01/06/2017   Procedure: CORONARY STENT INTERVENTION;  Surgeon: Wonda Ozell, MD;  Location[facility] INVASIVE CV LAB;  Service: Cardiovascular;  Laterality: N/A;   FINGER SURGERY Left 1984   ring finger reattached.    FOREARM FRACTURE SURGERY Right 2004   has 3 rods and 22 screws in it   INGUINAL HERNIA REPAIR Left    RIGHT/LEFT HEART CATH AND CORONARY ANGIOGRAPHY N/A 07/07/2016   Procedure: Right/Left Heart Cath and Coronary Angiography;  Surgeon: Toribio JONELLE Fuel, MD;  Location: [facility] INVASIVE CV LAB;  Service: Cardiovascular;   Laterality: N/A;   RIGHT/LEFT HEART CATH AND CORONARY ANGIOGRAPHY N/A 03/13/2018   Procedure: RIGHT/LEFT HEART CATH AND CORONARY ANGIOGRAPHY;  Surgeon: Fuel Toribio JONELLE, MD;  Location: [facility] INVASIVE CV LAB;  Service: Cardiovascular;  Laterality: N/A;   Current Outpatient Medications on File Prior to Visit  Medication Sig Dispense Refill   Accu-Chek FastClix Lancets MISC USE TO CHECK BLOOD SUGAR TWICE DAILY E11.9 102 each 12   acetaminophen  (TYLENOL ) 500 MG tablet Take 500-1,000 mg by mouth daily as needed for moderate pain or headache.     aspirin  81 MG EC tablet Take 81 mg by mouth at bedtime.      blood glucose meter kit and supplies Dispense based on patient and insurance preference. Use up to four times daily as directed. (FOR ICD-10 E10.9, E11.9). 1 each 0   Blood Glucose Monitoring Suppl DEVI 1 each by Does not apply route in the morning, at noon, and at bedtime. May substitute to any manufacturer covered by patient's insurance. Needs Onetouch by [employer] 1 each 0   Capsaicin 0.1 % CREA Apply topically as needed.     clopidogrel  (PLAVIX ) 75 MG tablet TAKE 1 TABLET BY MOUTH EVERY DAY 90 tablet 0   famotidine (PEPCID) 40 MG tablet Take 40 mg by mouth daily.  levothyroxine  (SYNTHROID ) 100 MCG tablet Take 50 mcg by mouth daily before breakfast.     levothyroxine  (SYNTHROID ) 75 MCG tablet Take 1 tablet (75 mcg total) by mouth daily. 90 tablet 3   mometasone  (ELOCON ) 0.1 % ointment Apply topically daily. 45 g 0   Multiple Vitamin (MULTIVITAMIN WITH MINERALS) TABS tablet Take 1 tablet by mouth daily.     rosuvastatin  (CRESTOR ) 20 MG tablet TAKE 1 TABLET BY MOUTH EVERY DAY 90 tablet 3   Semaglutide ,0.25 or 0.5MG /DOS, (OZEMPIC , 0.25 OR 0.5 MG/DOSE,) 2 MG/1.5ML SOPN Inject 0.5 mg into the skin once a week. 1.5 mL 3   Tiotropium Bromide-Olodaterol (STIOLTO RESPIMAT ) 2.5-2.5 MCG/ACT AERS Inhale 2 puffs into the lungs daily. 1 each 5   vitamin B-12 (CYANOCOBALAMIN) 1000 MCG tablet Take 1,000 mcg  by mouth daily.     traZODone  (DESYREL ) 50 MG tablet Take 1 tablet (50 mg total) by mouth at bedtime. (Patient not taking: Reported on 11/24/2023) 90 tablet 3   No current facility-administered medications on file prior to visit.   Allergies  Allergen Reactions   Bee Venom Swelling   Ceclor [Cefaclor] Hives   Ciprofloxacin Hives   Jardiance  [Empagliflozin ]    Metformin  And Related Diarrhea    upsesomach   Social History   Socioeconomic History   Marital status: Married    Spouse name: Barnie   Number of children: 0   Years of education: 12   Highest education level: Not on file  Occupational History    Comment: retired  Tobacco Use   Smoking status: Never   Smokeless tobacco: Never  Vaping Use   Vaping status: Never Used  Substance and Sexual Activity   Alcohol use: Not Currently    Alcohol/week: 2.0 standard drinks of alcohol    Types: 1 Glasses of wine, 1 Cans of beer per week    Comment: 1 beer/week   Drug use: No   Sexual activity: Not Currently  Other Topics Concern   Not on file  Social History Narrative   Retired. Lives with wife, Barnie.    Caffeine- coffee, 1 daily   Social Drivers of Corporate investment banker Strain: Low Risk  (05/20/2021)   Overall Financial Resource Strain (CARDIA)    Difficulty of Paying Living Expenses: Not hard at all  Food Insecurity: No Food Insecurity (05/20/2021)   Hunger Vital Sign    Worried About Running Out of Food in the Last Year: Never true    Ran Out of Food in the Last Year: Never true  Transportation Needs: No Transportation Needs (05/20/2021)   PRAPARE - Administrator, Civil Service (Medical): No    Lack of Transportation (Non-Medical): No  Physical Activity: Insufficiently Active (05/20/2021)   Exercise Vital Sign    Days of Exercise per Week: 5 days    Minutes of Exercise per Session: 20 min  Stress: No Stress Concern Present (05/20/2021)   [employer] of Occupational Health -  Occupational Stress Questionnaire    Feeling of Stress : Only a little  Social Connections: Socially Integrated (05/20/2021)   Social Connection and Isolation Panel    Frequency of Communication with Friends and Family: More than three times a week    Frequency of Social Gatherings with Friends and Family: More than three times a week    Attends Religious Services: More than 4 times per year    Active Member of [employer] or Organizations: Yes    Attends Banker Meetings: More  than 4 times per year    Marital Status: Married  Catering manager Violence: Not At Risk (05/20/2021)   Humiliation, Afraid, Rape, and Kick questionnaire    Fear of Current or Ex-Partner: No    Emotionally Abused: No    Physically Abused: No    Sexually Abused: No   Family History  Problem Relation Age of Onset   Diabetes Mellitus II Father    Heart failure Father    Kidney failure Father    Heart attack Mother       Review of Systems  All other systems reviewed and are negative.      Objective:   Physical Exam Vitals reviewed.  Constitutional:      General: He is not in acute distress.    Appearance: He is well-developed. He is not diaphoretic.  HENT:     Head: Normocephalic and atraumatic.     Right Ear: External ear normal.     Left Ear: External ear normal.     Nose: Nose normal.     Mouth/Throat:     Pharynx: No oropharyngeal exudate.   Eyes:     General: No visual field deficit or scleral icterus.       Right eye: No discharge.        Left eye: No discharge.     Conjunctiva/sclera: Conjunctivae normal.     Pupils: Pupils are equal, round, and reactive to light.   Neck:     Thyroid : No thyromegaly.     Vascular: No JVD.     Trachea: No tracheal deviation.   Cardiovascular:     Rate and Rhythm: Normal rate and regular rhythm.     Heart sounds: Normal heart sounds. No murmur heard.    No friction rub. No gallop.  Pulmonary:     Effort: Pulmonary effort is normal. No  respiratory distress.     Breath sounds: Normal breath sounds. No stridor. No wheezing or rales.  Chest:     Chest wall: No tenderness.  Abdominal:     General: Bowel sounds are normal. There is no distension.     Palpations: Abdomen is soft. There is no mass.     Tenderness: There is no abdominal tenderness. There is no guarding or rebound.   Musculoskeletal:     Right shoulder: Tenderness present. Decreased range of motion. Decreased strength.     Right hand: Tenderness present. No swelling. Decreased range of motion. Normal sensation. There is no disruption of two-point discrimination. Normal capillary refill.     Left hand: Tenderness present. No swelling. Decreased range of motion. Normal sensation. There is no disruption of two-point discrimination. Normal capillary refill.     Cervical back: Normal range of motion and neck supple.  Lymphadenopathy:     Cervical: No cervical adenopathy.   Skin:    General: Skin is warm.     Coloration: Skin is not pale.     Findings: No erythema or rash.   Neurological:     General: No focal deficit present.     Mental Status: He is alert and oriented to person, place, and time.     Cranial Nerves: No cranial nerve deficit, dysarthria or facial asymmetry.     Sensory: Sensation is intact. No sensory deficit.     Motor: Motor function is intact. No weakness, tremor, atrophy, abnormal muscle tone, seizure activity or pronator drift.     Coordination: Coordination is intact. Romberg sign negative. Coordination normal. Finger-Nose-Finger Test and Heel  to [place] Test normal.     Gait: Gait is intact. Gait and tandem walk normal.     Deep Tendon Reflexes: Reflexes are normal and symmetric.           Assessment & Plan:  Polyarthralgia - Plan: B. 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   I recommended using Voltaren gel 4 times a day due to the fact that he is on Plavix .  I did recommend holding rosuvastatin  to see if he notices any improvement in the pain all over his body.  Meanwhile I believe that he likely has impingement syndrome in his right shoulder.  Using sterile technique, I injected the right subacromial space with 2 cc of lidocaine , 2 cc of Marcaine, and 2 cc of 40 mg/mL Kenalog.

## 2023-11-24 NOTE — Addendum Note (Signed)
 Addended by: CORINNA BRISKER R on: 11/24/2023 12:04 PM   Modules accepted: Orders

## 2023-11-27 LAB — COMPREHENSIVE METABOLIC PANEL WITH GFR
AG Ratio: 1.9 (calc) (ref 1.0–2.5)
ALT: 16 U/L (ref 9–46)
AST: 20 U/L (ref 10–35)
Albumin: 4.2 g/dL (ref 3.6–5.1)
Alkaline phosphatase (APISO): 67 U/L (ref 35–144)
BUN: 24 mg/dL (ref 7–25)
CO2: 24 mmol/L (ref 20–32)
Calcium: 9.4 mg/dL (ref 8.6–10.3)
Chloride: 106 mmol/L (ref 98–110)
Creat: 1.18 mg/dL (ref 0.70–1.28)
Globulin: 2.2 g/dL (ref 1.9–3.7)
Glucose, Bld: 145 mg/dL — ABNORMAL HIGH (ref 65–99)
Potassium: 4.1 mmol/L (ref 3.5–5.3)
Sodium: 139 mmol/L (ref 135–146)
Total Bilirubin: 0.4 mg/dL (ref 0.2–1.2)
Total Protein: 6.4 g/dL (ref 6.1–8.1)
eGFR: 64 mL/min/{1.73_m2} (ref >=60)

## 2023-11-27 LAB — CBC WITH DIFFERENTIAL/PLATELET
Absolute Lymphocytes: 1411 {cells}/uL (ref 850–3900)
Absolute Monocytes: 804 {cells}/uL (ref 200–950)
Basophils Absolute: 88 {cells}/uL (ref 0–200)
Basophils Relative: 0.9 %
Eosinophils Absolute: 225 {cells}/uL (ref 15–500)
Eosinophils Relative: 2.3 %
HCT: 42.9 % (ref 38.5–50.0)
Hemoglobin: 13.8 g/dL (ref 13.2–17.1)
MCH: 31.6 pg (ref 27.0–33.0)
MCHC: 32.2 g/dL (ref 32.0–36.0)
MCV: 98.2 fL (ref 80.0–100.0)
MPV: 11.4 fL (ref 7.5–12.5)
Monocytes Relative: 8.2 %
Neutro Abs: 7272 {cells}/uL (ref 1500–7800)
Neutrophils Relative %: 74.2 %
Platelets: 213 10*3/uL (ref 140–400)
RBC: 4.37 10*6/uL (ref 4.20–5.80)
RDW: 13 % (ref 11.0–15.0)
Total Lymphocyte: 14.4 %
WBC: 9.8 10*3/uL (ref 3.8–10.8)

## 2023-11-27 LAB — B. BURGDORFI ANTIBODIES BY WB
B burgdorferi IgG Abs (IB): NEGATIVE
B burgdorferi IgM Abs (IB): NEGATIVE
Lyme Disease 18 kD IgG: NONREACTIVE
Lyme Disease 23 kD IgG: NONREACTIVE
Lyme Disease 23 kD IgM: NONREACTIVE
Lyme Disease 28 kD IgG: NONREACTIVE
Lyme Disease 30 kD IgG: NONREACTIVE
Lyme Disease 39 kD IgG: NONREACTIVE
Lyme Disease 39 kD IgM: NONREACTIVE
Lyme Disease 41 kD IgG: NONREACTIVE
Lyme Disease 41 kD IgM: NONREACTIVE
Lyme Disease 45 kD IgG: NONREACTIVE
Lyme Disease 58 kD IgG: REACTIVE — AB
Lyme Disease 66 kD IgG: NONREACTIVE
Lyme Disease 93 kD IgG: NONREACTIVE

## 2023-11-27 LAB — RHEUMATOID FACTOR: Rheumatoid fact SerPl-aCnc: <10 [IU]/mL (ref <14)

## 2023-11-28 ENCOUNTER — Ambulatory Visit: Payer: Self-pay | Admitting: Family Medicine

## 2023-11-28 ENCOUNTER — Ambulatory Visit (HOSPITAL_COMMUNITY)

## 2023-11-30 ENCOUNTER — Ambulatory Visit (HOSPITAL_COMMUNITY)

## 2023-12-05 ENCOUNTER — Encounter (HOSPITAL_COMMUNITY)
Admission: RE | Admit: 2023-12-05 | Discharge: 2023-12-05 | Disposition: A | Discharge status: Home / Self Care | Source: Ambulatory Visit | Attending: Cardiology | Admitting: Cardiology

## 2023-12-05 DIAGNOSIS — R0602 Shortness of breath: Secondary | ICD-10-CM | POA: Diagnosis present

## 2023-12-05 DIAGNOSIS — J849 Interstitial pulmonary disease, unspecified: Secondary | ICD-10-CM | POA: Diagnosis present

## 2023-12-05 NOTE — Progress Notes (Signed)
 Pulmonary Individual Treatment Plan  Patient Details  Name: Caleb Butler MRN: [id number] Date of Birth: 1946/05/09 Referring Provider:   Flowsheet Row PULMONARY REHAB OTHER RESP ORIENTATION from 07/04/2023 in [facility]  Referring Provider Alvan Carrier MD  [Primary Pulmonologist: Dr. Brown    Initial Encounter Date:  Flowsheet Row PULMONARY REHAB OTHER RESP ORIENTATION from 07/04/2023 in [facility]  Date 07/04/23    Visit Diagnosis: ILD (interstitial lung disease) (HCC)  SOB (shortness of breath)  Patient's Home Medications on Admission:   Current Outpatient Medications:    Accu-Chek FastClix Lancets MISC, USE TO CHECK BLOOD SUGAR TWICE DAILY E11.9, Disp: 102 each, Rfl: 12   acetaminophen  (TYLENOL ) 500 MG tablet, Take 500-1,000 mg by mouth daily as needed for moderate pain or headache., Disp: , Rfl:    aspirin  81 MG EC tablet, Take 81 mg by mouth at bedtime. , Disp: , Rfl:    blood glucose meter kit and supplies, Dispense based on patient and insurance preference. Use up to four times daily as directed. (FOR ICD-10 E10.9, E11.9)., Disp: 1 each, Rfl: 0   Blood Glucose Monitoring Suppl DEVI, 1 each by Does not apply route in the morning, at noon, and at bedtime. May substitute to any manufacturer covered by patient's insurance. Needs Onetouch by [employer], Disp: 1 each, Rfl: 0   Capsaicin 0.1 % CREA, Apply topically as needed., Disp: , Rfl:    clopidogrel  (PLAVIX ) 75 MG tablet, TAKE 1 TABLET BY MOUTH EVERY DAY, Disp: 90 tablet, Rfl: 0   famotidine (PEPCID) 40 MG tablet, Take 40 mg by mouth daily., Disp: , Rfl:    levothyroxine  (SYNTHROID ) 100 MCG tablet, Take 50 mcg by mouth daily before breakfast., Disp: , Rfl:    levothyroxine  (SYNTHROID ) 75 MCG tablet, Take 1 tablet (75 mcg total) by mouth daily., Disp: 90 tablet, Rfl: 3   mometasone  (ELOCON ) 0.1 % ointment, Apply topically daily., Disp: 45 g, Rfl: 0   Multiple Vitamin  (MULTIVITAMIN WITH MINERALS) TABS tablet, Take 1 tablet by mouth daily., Disp: , Rfl:    rosuvastatin  (CRESTOR ) 20 MG tablet, TAKE 1 TABLET BY MOUTH EVERY DAY, Disp: 90 tablet, Rfl: 3   Semaglutide ,0.25 or 0.5MG /DOS, (OZEMPIC , 0.25 OR 0.5 MG/DOSE,) 2 MG/1.5ML SOPN, Inject 0.5 mg into the skin once a week., Disp: 1.5 mL, Rfl: 3   Tiotropium Bromide-Olodaterol (STIOLTO RESPIMAT ) 2.5-2.5 MCG/ACT AERS, Inhale 2 puffs into the lungs daily., Disp: 1 each, Rfl: 5   traZODone  (DESYREL ) 50 MG tablet, Take 1 tablet (50 mg total) by mouth at bedtime. (Patient not taking: Reported on 11/24/2023), Disp: 90 tablet, Rfl: 3   vitamin B-12 (CYANOCOBALAMIN) 1000 MCG tablet, Take 1,000 mcg by mouth daily., Disp: , Rfl:   Past Medical History: Past Medical History:  Diagnosis Date   Anxiety    Aortic aneurysm (HCC)    dilated aortic root on echo 2024   Arthritis    my back is loaded w/it (01/06/2017)   CAD (coronary artery disease)    80% ostial lesion in L circumflex (2017)   Chronic lower back pain    GERD (gastroesophageal reflux disease)    Hiatal hernia    High cholesterol    Hypothyroidism    IBS (irritable bowel syndrome)    Numbness and tingling of right lower extremity    since 11/30/2016 (01/06/2017)   PMR (polymyalgia rheumatica) (HCC)    Stroke (HCC)    Type 2 diabetes mellitus (HCC)     Tobacco  Use: Social History   Tobacco Use  Smoking Status Never  Smokeless Tobacco Never    Labs: Review Flowsheet  More data exists      Latest Ref Rng & Units 08/12/2019 02/20/2020 02/24/2022 04/04/2023 07/31/2023  Labs for ITP Cardiac and Pulmonary Rehab  Cholestrol <200 mg/dL 892  - 883  896  886   LDL (calc) mg/dL (calc) 48  - 45  35  46   HDL-C > OR = 40 mg/dL 40  - 45  40  47   Trlycerides <150 mg/dL 896  - 812  787  889   Hemoglobin A1c <5.7 % of total Hgb 6.6  7.5  6.8  6.8  7.7     Capillary Blood Glucose: Lab Results  Component Value Date   GLUCAP 171 (H) 07/13/2023   GLUCAP 225 (H)  07/06/2023   GLUCAP 111 (H) 03/13/2018   GLUCAP 167 (H) 01/07/2017   GLUCAP 178 (H) 01/06/2017     Pulmonary Assessment Scores:  Pulmonary Assessment Scores     Row Name 07/04/23 1413         ADL UCSD   ADL Phase Entry     SOB Score total 25     Rest 0     Walk 1     Stairs 3     Bath 0     Dress 1     Shop 1       CAT Score   CAT Score 16       mMRC Score   mMRC Score 2       UCSD: Self-administered rating of dyspnea associated with activities of daily living (ADLs) 6-point scale (0 = not at all to 5 = maximal or unable to do because of breathlessness)  Scoring Scores range from 0 to 120.  Minimally important difference is 5 units  CAT: CAT can identify the health impairment of COPD patients and is better correlated with disease progression.  CAT has a scoring range of zero to 40. The CAT score is classified into four groups of low (less than 10), medium (10 - 20), high (21-30) and very high (31-40) based on the impact level of disease on health status. A CAT score over 10 suggests significant symptoms.  A worsening CAT score could be explained by an exacerbation, poor medication adherence, poor inhaler technique, or progression of COPD or comorbid conditions.  CAT MCID is 2 points  mMRC: mMRC (Modified Medical Research Council) Dyspnea Scale is used to assess the degree of baseline functional disability in patients of respiratory disease due to dyspnea. No minimal important difference is established. A decrease in score of 1 point or greater is considered a positive change.   Pulmonary Function Assessment:  Pulmonary Function Assessment - 07/04/23 1413       Breath   Shortness of Breath Yes;Limiting activity;Fear of Shortness of Breath          Exercise Target Goals: Exercise Program Goal: Individual exercise prescription set using results from initial 6 min walk test and THRR while considering  patient's activity barriers and safety.   Exercise  Prescription Goal: Initial exercise prescription builds to 30-45 minutes a day of aerobic activity, 2-3 days per week.  Home exercise guidelines will be given to patient during program as part of exercise prescription that the participant will acknowledge.  Activity Barriers & Risk Stratification:  Activity Barriers & Cardiac Risk Stratification - 07/04/23 0824       Activity Barriers &  Cardiac Risk Stratification   Activity Barriers Joint Problems;Arthritis;Deconditioning;History of Falls;Muscular Weakness;Back Problems;Shortness of Breath   disc compression, chronic back pain uses horse linament, r leg numbness         6 Minute Walk:  6 Minute Walk     Row Name 07/04/23 1052 10/26/23 1114       6 Minute Walk   Phase Initial Discharge    Distance 1140 feet 1330 feet    Distance Feet Change -- 190 ft    Walk Time 6 minutes 6 minutes    # of Rest Breaks 0 0    MPH 2.16 2.51    METS 2.14 2.46    RPE 11 12    Perceived Dyspnea  1 2    VO2 Peak 7.49 8.6    Symptoms Yes (comment) No    Comments SOB --    Resting HR 78 bpm 82 bpm    Resting BP 116/62 124/66    Resting Oxygen Saturation  96 % 94 %    Exercise Oxygen Saturation  during 6 min walk 95 % 90 %    Max Ex. HR 89 bpm 97 bpm    Max Ex. BP 126/64 120/60    2 Minute Post BP 118/62 116/62      Interval HR   1 Minute HR 86 80    2 Minute HR 88 86    3 Minute HR 87 85    4 Minute HR 88 88    5 Minute HR 79 86    6 Minute HR 89 91    2 Minute Post HR 81 78    Interval Heart Rate? Yes Yes      Interval Oxygen   Interval Oxygen? Yes Yes    Baseline Oxygen Saturation % 96 % 94 %    1 Minute Oxygen Saturation % 95 % 91 %    1 Minute Liters of Oxygen 0 L  Room Air 0 L    2 Minute Oxygen Saturation % 95 % 90 %    2 Minute Liters of Oxygen 0 L 0 L    3 Minute Oxygen Saturation % 95 % 95 %    3 Minute Liters of Oxygen 0 L 0 L    4 Minute Oxygen Saturation % 96 % 90 %    4 Minute Liters of Oxygen 0 L 0 L    5 Minute  Oxygen Saturation % 95 % 95 %    5 Minute Liters of Oxygen 0 L 0 L    6 Minute Oxygen Saturation % 96 % 98 %    6 Minute Liters of Oxygen 0 L 0 L    2 Minute Post Oxygen Saturation % 96 % 98 %    2 Minute Post Liters of Oxygen 0 L 0 L       Oxygen Initial Assessment:  Oxygen Initial Assessment - 07/04/23 0827       Home Oxygen   Home Oxygen Device None    Sleep Oxygen Prescription None    Home Exercise Oxygen Prescription None    Home Resting Oxygen Prescription None      Initial 6 min Walk   Oxygen Used None      Program Oxygen Prescription   Program Oxygen Prescription None      Intervention   Short Term Goals To learn and understand importance of monitoring SPO2 with pulse oximeter and demonstrate accurate use of the pulse oximeter.;To learn and understand  importance of maintaining oxygen saturations>88%;To learn and demonstrate proper pursed lip breathing techniques or other breathing techniques. ;To learn and demonstrate proper use of respiratory medications    Long  Term Goals Maintenance of O2 saturations>88%;Compliance with respiratory medication;Verbalizes importance of monitoring SPO2 with pulse oximeter and return demonstration;Exhibits proper breathing techniques, such as pursed lip breathing or other method taught during program session;Demonstrates proper use of MDI's          Oxygen Re-Evaluation:  Oxygen Re-Evaluation     Row Name 08/29/23 1114 11/02/23 1120           Program Oxygen Prescription   Program Oxygen Prescription None None        Home Oxygen   Home Oxygen Device None None      Sleep Oxygen Prescription None None      Home Exercise Oxygen Prescription None None      Home Resting Oxygen Prescription None None        Goals/Expected Outcomes   Short Term Goals To learn and understand importance of monitoring SPO2 with pulse oximeter and demonstrate accurate use of the pulse oximeter.;To learn and understand importance of maintaining oxygen  saturations>88%;To learn and demonstrate proper pursed lip breathing techniques or other breathing techniques. ;To learn and demonstrate proper use of respiratory medications To learn and understand importance of monitoring SPO2 with pulse oximeter and demonstrate accurate use of the pulse oximeter.;To learn and understand importance of maintaining oxygen saturations>88%;To learn and demonstrate proper pursed lip breathing techniques or other breathing techniques. ;To learn and demonstrate proper use of respiratory medications      Long  Term Goals Maintenance of O2 saturations>88%;Compliance with respiratory medication;Verbalizes importance of monitoring SPO2 with pulse oximeter and return demonstration;Exhibits proper breathing techniques, such as pursed lip breathing or other method taught during program session;Demonstrates proper use of MDI's Maintenance of O2 saturations>88%;Compliance with respiratory medication;Verbalizes importance of monitoring SPO2 with pulse oximeter and return demonstration;Exhibits proper breathing techniques, such as pursed lip breathing or other method taught during program session;Demonstrates proper use of MDI's      Comments mike is doing well in rehab. 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 His oxygen levels during exercise has been staying in the upper 90s. mike is doing well in rehab. 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 His oxygen levels during exercise has been staying in the upper 90s. 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,      Goals/Expected Outcomes contnmue to exercise at home and attend rehab and working on PLB contnmue to exercise at home and attend rehab and working on PLB         Oxygen Discharge (Final Oxygen  Re-Evaluation):  Oxygen Re-Evaluation - 11/02/23 1120       Program Oxygen Prescription   Program Oxygen Prescription None      Home Oxygen   Home Oxygen Device None    Sleep Oxygen Prescription None    Home Exercise Oxygen Prescription None    Home Resting Oxygen Prescription None      Goals/Expected Outcomes   Short Term Goals To learn and understand importance of monitoring SPO2 with pulse oximeter and demonstrate accurate use of the pulse oximeter.;To learn and understand importance of  maintaining oxygen saturations>88%;To learn and demonstrate proper pursed lip breathing techniques or other breathing techniques. ;To learn and demonstrate proper use of respiratory medications    Long  Term Goals Maintenance of O2 saturations>88%;Compliance with respiratory medication;Verbalizes importance of monitoring SPO2 with pulse oximeter and return demonstration;Exhibits proper breathing techniques, such as pursed lip breathing or other method taught during program session;Demonstrates proper use of MDI's    Comments mike is doing well in rehab. 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 His oxygen levels during exercise has been staying in the upper 90s. 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 Theophilus     Oxygen   Maintain Oxygen Saturation 88% or higher      Treadmill   MPH 2    Grade 0.5    Minutes 15    METs 2.67      REL-XR   Level 2    Speed 50    Minutes 15    METs 2.5      Prescription Details   Frequency  (times per week) 2    Duration Progress to 30 minutes of continuous aerobic without signs/symptoms of physical distress      Intensity   THRR 40-80% of Max Heartrate 104-130    Ratings of Perceived Exertion 11-13    Perceived Dyspnea 0-4      Progression   Progression Continue to progress workloads to maintain intensity without signs/symptoms of physical distress.      Resistance Training   Training Prescription Yes    Weight 4 lb    Reps 10-15          Perform Capillary Blood Glucose checks as needed.  Exercise Prescription Changes:   Exercise Prescription Changes     Row Name 07/04/23 1100 07/11/23 1200 07/20/23 1200 08/08/23 1200 08/15/23 1500     Response to Exercise   Blood Pressure (Admit) 116/62 118/60 110/60 112/62 112/70   Blood Pressure (Exercise) 126/64 118/62 112/62 -- --   Blood Pressure (Exit) 112/66 108/68 106/60 110/60 110/60   Heart Rate (Admit) 78 bpm 78 bpm 63 bpm 61 bpm 68 bpm   Heart Rate (Exercise) 89 bpm 103 bpm 86 bpm 100 bpm 85 bpm   Heart Rate (Exit) 84 bpm 85 bpm 76 bpm 81 bpm 81 bpm   Oxygen Saturation (Admit) 96 % 96 % 99 % 97 % 99 %   Oxygen Saturation (Exercise) 95 % 93 % 96 % 96 % 97 %   Oxygen Saturation (Exit) 93 % 97 % 96 % 97 % 98 %   Rating of Perceived Exertion (Exercise) 11 15 12 13 12    Perceived Dyspnea (Exercise) 1 3 3 3 2    Symptoms SOb -- -- -- --   Comments walk test results -- -- -- --   Duration -- Continue with 30 min of aerobic exercise without signs/symptoms of physical distress. Continue with 30 min of aerobic exercise without signs/symptoms of  physical distress. Continue with 30 min of aerobic exercise without signs/symptoms of physical distress. Continue with 30 min of aerobic exercise without signs/symptoms of physical distress.   Intensity -- THRR unchanged THRR unchanged THRR unchanged THRR unchanged     Progression   Progression -- Continue to progress workloads to maintain intensity without signs/symptoms of  physical distress. Continue to progress workloads to maintain intensity without signs/symptoms of physical distress. Continue to progress workloads to maintain intensity without signs/symptoms of physical distress. Continue to progress workloads to maintain intensity without signs/symptoms of physical distress.     Resistance Training   Training Prescription -- Yes Yes Yes Yes   Weight -- 4 4 4 4    Reps -- 10-15 10-15 10-15 10-15     Treadmill   MPH -- 2.5 2.5 3 2.8   Grade -- 0.5 1.5 2 1.5   Minutes -- 15 15 15 15    METs -- 3.09 3.43 4.12 3.72     REL-XR   Level -- 2 2 2 2    Speed -- 83 61 62 68   Minutes -- 15 15 15 15    METs -- 4.7 4.6 3.6 5     Oxygen   Maintain Oxygen Saturation -- -- 88% or higher 88% or higher 88% or higher    Row Name 09/05/23 1500 10/03/23 1500 10/17/23 1300 11/02/23 1500       Response to Exercise   Blood Pressure (Admit) 112/70 120/60 134/60 100/58    Blood Pressure (Exit) 116/90 100/60 106/60 94/60    Heart Rate (Admit) 58 bpm 62 bpm 61 bpm 78 bpm    Heart Rate (Exercise) 81 bpm 74 bpm 90 bpm 103 bpm    Heart Rate (Exit) 69 bpm 77 bpm 78 bpm 86 bpm    Oxygen Saturation (Admit) 99 % 99 % 95 % 97 %    Oxygen Saturation (Exercise) 95 % 93 % 99 % 95 %    Oxygen Saturation (Exit) 96 % 99 % 98 % 96 %    Rating of Perceived Exertion (Exercise) 13 13 13 13     Perceived Dyspnea (Exercise) 3 2 2 3     Duration Continue with 30 min of aerobic exercise without signs/symptoms of physical distress. Continue with 30 min of aerobic exercise without signs/symptoms of physical distress. Continue with 30 min of aerobic exercise without signs/symptoms of physical distress. Continue with 30 min of aerobic exercise without signs/symptoms of physical distress.    Intensity THRR unchanged THRR unchanged THRR unchanged THRR unchanged      Progression   Progression Continue to progress workloads to maintain intensity without signs/symptoms of physical distress. Continue to  progress workloads to maintain intensity without signs/symptoms of physical distress. Continue to follow PAD protocol Continue to progress workloads to maintain intensity without signs/symptoms of physical distress.      Resistance Training   Training Prescription Yes Yes Yes Yes    Weight 5 5 5 5     Reps 10-15 10-15 10-15 10-15      Treadmill   MPH 2.5 2.5 2.5 2.5    Grade 2 2 2 2     Minutes 15 15 15 15     METs 3.6 3.6 3.6 3.6      REL-XR   Level 5 3 1 1     Speed 66 68 69 68    Minutes 15 15 15 15     METs 0.4 4.4 5 5.1      Oxygen   Maintain Oxygen Saturation  88% or higher 88% or higher 88% or higher 88% or higher       Exercise Comments:   Exercise Comments     Row Name 12/05/23 1038           Exercise Comments Pellegrino graduated today from  rehab with 36 sessions completed.  Details of the patient's exercise prescription and what He needs to do in order to continue the prescription and progress were discussed with patient.  Patient was given a copy of prescription and goals.  Patient verbalized understanding. 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          Exercise Goals and Review:   Exercise Goals     Row Name 07/04/23 1103             Exercise Goals   Increase Physical Activity Yes       Intervention Provide advice, education, support and counseling about physical activity/exercise needs.;Develop an individualized exercise prescription for aerobic and resistive training based on initial evaluation findings, risk stratification, comorbidities and participant's personal goals.       Expected Outcomes Short Term: Attend rehab on a regular basis to increase amount of physical activity.;Long Term: Add in home exercise to make exercise part of routine and to increase amount of physical activity.;Long Term: Exercising regularly at least 3-5 days a week.       Increase Strength and Stamina Yes       Intervention Provide advice, education,  support and counseling about physical activity/exercise needs.;Develop an individualized exercise prescription for aerobic and resistive training based on initial evaluation findings, risk stratification, comorbidities and participant's personal goals.       Expected Outcomes Short Term: Increase workloads from initial exercise prescription for resistance, speed, and METs.;Short Term: Perform resistance training exercises routinely during rehab and add in resistance training at home;Long Term: Improve cardiorespiratory fitness, muscular endurance and strength as measured by increased METs and functional capacity ( )       Able to understand and use rate of perceived exertion (RPE) scale Yes       Intervention Provide education and explanation on how to use RPE scale       Expected Outcomes Short Term: Able to use RPE daily in rehab to express subjective intensity level;Long Term:  Able to use RPE to guide intensity level when exercising independently       Able to understand and use Dyspnea scale Yes       Intervention Provide education and explanation on how to use Dyspnea scale       Expected Outcomes Short Term: Able to use Dyspnea scale daily in rehab to express subjective sense of shortness of breath during exertion;Long Term: Able to use Dyspnea scale to guide intensity level when exercising independently       Knowledge and understanding of Target Heart Rate Range (THRR) Yes       Intervention Provide education and explanation of THRR including how the numbers were predicted and where they are located for reference       Expected Outcomes Short Term: Able to state/look up THRR;Short Term: Able to use daily as guideline for intensity in rehab;Long Term: Able to use THRR to govern intensity when exercising independently       Able to check pulse independently Yes       Intervention Provide education and demonstration on how to check pulse in carotid and radial arteries.;Review the importance of  being able  to check your own pulse for safety during independent exercise       Expected Outcomes Short Term: Able to explain why pulse checking is important during independent exercise;Long Term: Able to check pulse independently and accurately       Understanding of Exercise Prescription Yes       Intervention Provide education, explanation, and written materials on patient's individual exercise prescription       Expected Outcomes Short Term: Able to explain program exercise prescription;Long Term: Able to explain home exercise prescription to exercise independently          Exercise Goals Re-Evaluation :  Exercise Goals Re-Evaluation     Row Name 07/11/23 1251 07/24/23 0823 08/04/23 0748 08/09/23 0806 08/23/23 1345     Exercise Goal Re-Evaluation   Exercise Goals Review Increase Physical Activity;Increase Strength and Stamina;Understanding of Exercise Prescription Increase Physical Activity;Increase Strength and Stamina;Understanding of Exercise Prescription Increase Physical Activity;Increase Strength and Stamina;Understanding of Exercise Prescription Increase Physical Activity;Increase Strength and Stamina;Understanding of Exercise Prescription Increase Physical Activity;Increase Strength and Stamina;Understanding of Exercise Prescription   Comments Ozell is doing well in rehab and is tolerating exercise well. He is SOB when he gets off the treadmill. He is only on his 3rd visit. Will continue to montior and progress as able. Ozell is doing well in rehab and is tolerating exercise well. He is SOB when he gets off the treadmill. Will continue to montior and progress as able. Ozell is doing well in rehab. 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 He is enjoying the program and getting to talk to everyone in class. Garrel is doing well in rehab and is on his 11th visit. He has been  increasing his speed on the treadmill to 3.0 with a grade of 2.0. Will continue to monitor and progress as able. Garrel is doing well in rehab and is pushing himself. He is keeping his levels on his equipment and has not moved up in the past weeks. Will contiue to monitor and progress asable   Expected Outcomes Continue to attend rehab Continue to attend rehab Continue to attend rehab and exercise outside of class with continues walking continue to attend rehab continue to attend rehab    Row Name 08/29/23 1054 10/03/23 1113 11/02/23 1110         Exercise Goal Re-Evaluation   Exercise Goals Review Increase Physical Activity;Increase Strength and Stamina;Understanding of Exercise Prescription Increase Physical Activity;Increase Strength and Stamina;Understanding of Exercise Prescription Increase Physical Activity;Increase Strength and Stamina;Understanding of Exercise Prescription     Comments Garrel is doing well in rehab and is pushing himslef. He has noticed that he seems to be more in shape but has not notcied his breathing improving. 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 He hasnt needed to stop to take breaks as much. Garrel is doing well in rehab.  He is able to do more in rehab but is still limited by his breathing.  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 Garrel is doing well in rehad. He is trying to oush himslef in rehab but is limited by his breathing. He stated taht  he has not felt much of an improvment. 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   continue to attend rehab Short; Contineu to try to walk more at home Long: Continue to improve stamina Short;  Contineu to try to walk more at home Long: Continue to improve stamina        Discharge Exercise Prescription (Final Exercise Prescription Changes):  Exercise Prescription Changes - 11/02/23 1500       Response to Exercise   Blood Pressure (Admit) 100/58    Blood Pressure (Exit) 94/60    Heart Rate (Admit) 78 bpm    Heart Rate (Exercise) 103 bpm    Heart Rate (Exit) 86 bpm    Oxygen Saturation (Admit) 97 %    Oxygen Saturation (Exercise) 95 %    Oxygen Saturation (Exit) 96 %    Rating of Perceived Exertion (Exercise) 13    Perceived Dyspnea (Exercise) 3    Duration Continue with 30 min of aerobic exercise without signs/symptoms of physical distress.    Intensity THRR unchanged      Progression   Progression Continue to progress workloads to maintain intensity without signs/symptoms of physical distress.      Resistance Training   Training Prescription Yes    Weight 5    Reps 10-15      Treadmill   MPH 2.5    Grade 2    Minutes 15    METs 3.6      REL-XR   Level 1    Speed 68    Minutes 15    METs 5.1      Oxygen   Maintain Oxygen Saturation 88% or higher          Nutrition:  Target Goals: Understanding of nutrition guidelines, daily intake of sodium 1500mg , cholesterol 200mg , calories 30% from fat and 7% or less from saturated fats, daily to have 5 or more servings of fruits and vegetables.  Biometrics:  Pre Biometrics - 07/04/23 1104       Pre Biometrics   Height 5' 8 (1.727 m)    Weight 81 kg    Waist Circumference 38.5 inches    Hip Circumference 38.5 inches    Waist to Hip Ratio 1 %    BMI (Calculated) 27.15    Grip Strength 26.8 kg    Single Leg Stand 30 seconds          Post Biometrics - 10/26/23 1116        Post  Biometrics   Height 5' 8 (1.727 m)    Weight 80.5 kg    Waist Circumference 39 inches    Hip Circumference 38 inches    Waist to Hip Ratio 1.03 %    BMI (Calculated) 26.99    Grip Strength 10.6 kg    Single Leg Stand  30 seconds          Nutrition Therapy Plan and Nutrition Goals:  Nutrition Therapy & Goals - 07/04/23 1407       Intervention Plan   Intervention Prescribe, educate and counsel regarding individualized specific dietary modifications aiming towards targeted core components such as weight, hypertension, lipid management, diabetes, heart failure and other comorbidities.;Nutrition handout(s) given to patient.    Expected Outcomes Short Term Goal: Understand basic principles of dietary content,  such as calories, fat, sodium, cholesterol and nutrients.;Long Term Goal: Adherence to prescribed nutrition plan.          Nutrition Assessments:  MEDIFICTS Score Key: >=70 Need to make dietary changes  40-70 Heart Healthy Diet <= 40 Therapeutic Level Cholesterol Diet  Flowsheet Row PULMONARY REHAB OTHER RESP ORIENTATION from 07/04/2023 in [facility] CARD[facility]AC REHABILITATION  Picture Your Plate Total Score on Admission 56   Picture Your Plate Scores: <59 Unhealthy dietary pattern with much room for improvement. 41-50 Dietary pattern unlikely to meet recommendations for good health and room for improvement. 51-60 More healthful dietary pattern, with some room for improvement.  >60 Healthy dietary pattern, although there may be some specific behaviors that could be improved.    Nutrition Goals Re-Evaluation:  Nutrition Goals Re-Evaluation     Row Name 08/04/23 0750 08/29/23 1103 10/03/23 1115 11/02/23 1115       Goals   Nutrition Goal Healthy eating Healthy eating Continue to try to drink water throughtout the day try to eat healthy meals and find foods he lieks to eat Continue to try to drink water throughtout the day try to eat healthy meals and find foods he lieks to eat    Comment Garrel is doing well in rehab. He has been cutting out sweets and watching what he eats. He is on ozempic  so he is trying to eat healthier and watching portion control/ Garrel is doing well in rehab. He said his  niurtion has not been welll lately. He is on ozempic  and stated that he just hasnt been hungery and not wanting to eat. He will eat breakfest and dinner with a protein shake for lunch. He is trying to drink more water during the day but is having a hard time. Garrel is doing well in rehab.  He is working on his diet but still does not have much of an appetite.  He is trying to get in  enough protein.  He has stopped his ozempic .  He is trying to drink more. Garrel is doing well in rehab, He continues to work on his diet but does not have a very big appetite. He continues to try to drink more water throughtput the day as well,    Expected Outcome Continue to eat healthy and work on cutting out sweets and watching portion control Continue to try to drink water throughtout the day  try to eat healthy meals and find foods he lieks to eat Short: Continue to add in protein and eat throughout day LOng: continue to drink more Short: work on eating a heathly meal   long term: work on drinking water more       Nutrition Goals Discharge (Final Nutrition Goals Re-Evaluation):  Nutrition Goals Re-Evaluation - 11/02/23 1115       Goals   Nutrition Goal Continue to try to drink water throughtout the day try to eat healthy meals and find foods he lieks to eat    Comment Garrel is doing well in rehab, He continues to work on his diet but does not have a very big appetite. He continues to try to drink more water throughtput the day as well,    Expected Outcome Short: work on eating a heathly meal   long term: work on drinking water more          Psychosocial: Target Goals: Acknowledge presence or absence of significant depression and/or stress, maximize coping skills, provide positive support system. Participant is able to verbalize types  and ability to use techniques and skills needed for reducing stress and depression.  Initial Review & Psychosocial Screening:  Initial Psych Review & Screening - 07/04/23 9171        Initial Review   Current issues with Current Stress Concerns;Current Sleep Concerns    Source of Stress Concerns Chronic Illness;Unable to participate in former interests or hobbies;Unable to perform yard/household activities    Comments new puppy, had to put previous dog down after a stroke, history not sleeping well and up to bathroom (non alcholic beer helps too), breathing limits his activity any time he does anythign      Family Dynamics   Good Support System? Yes   wife (nurse), family lives in [place]     Barriers   Psychosocial barriers to participate in program The patient should benefit from training in stress management and relaxation.;Psychosocial barriers identified (see note)      Screening Interventions   Interventions Encouraged to exercise;Provide feedback about the scores to participant;To provide support and resources with identified psychosocial needs    Expected Outcomes Short Term goal: Utilizing psychosocial counselor, staff and physician to assist with identification of specific Stressors or current issues interfering with healing process. Setting desired goal for each stressor or current issue identified.;Long Term Goal: Stressors or current issues are controlled or eliminated.;Short Term goal: Identification and review with participant of any Quality of Life or Depression concerns found by scoring the questionnaire.;Long Term goal: The participant improves quality of Life and PHQ9 Scores as seen by post scores and/or verbalization of changes          Quality of Life Scores:  Scores of 19 and below usually indicate a poorer quality of life in these areas.  A difference of  2-3 points is a clinically meaningful difference.  A difference of 2-3 points in the total score of the Quality of Life Index has been associated with significant improvement in overall quality of life, self-image, physical symptoms, and general health in studies assessing change in quality of  life.   PHQ-9: Review Flowsheet  More data exists      11/24/2023 10/03/2023 08/03/2023 07/04/2023 05/20/2022  Depression screen PHQ 2/9  Decreased Interest 2 1 2 1  0  Down, Depressed, Hopeless 2 0 0 0 0  PHQ - 2 Score 4 1 2 1  0  Altered sleeping 3 0 0 1 -  Tired, decreased energy 2 2 2 2  -  Change in appetite 2 2 1 2  -  Feeling bad or failure about yourself  1 1 0 0 -  Trouble concentrating 0 0 1 1 -  Moving slowly or fidgety/restless 0 1 0 0 -  Suicidal thoughts 0 0 0 0 -  PHQ-9 Score 12 7 6 7  -  Difficult doing work/chores Very difficult Somewhat difficult Somewhat difficult - -   Interpretation of Total Score  Total Score Depression Severity:  1-4 = Minimal depression, 5-9 = Mild depression, 10-14 = Moderate depression, 15-19 = Moderately severe depression, 20-27 = Severe depression   Psychosocial Evaluation and Intervention:  Psychosocial Evaluation - 07/04/23 1106       Psychosocial Evaluation & Interventions   Interventions Relaxation education;Encouraged to exercise with the program and follow exercise prescription;Stress management education    Comments Ozell is coming into pulmonary rehab for ILD.  He also has heart history.  He has done the program previously for both cardiac and pulmonary and is looking forward to building up his stamina and being able  to breathe better again.  He lives at home with his wife and they rest of their family is in [place]. They live on a farm with horses, dogs, cats, chickens, and a donkey.  He does not precieve any barriers to attending rehab.  He is eager to get going and be able to not have to stop as much. He did lose his dog a few weeks ago and then his wife found a new [place] puppy.  So they now have a new puppy to train and she has been scratching his arms up.    Expected Outcomes Short: Attend rehab to build stamina Long: Conitnue to enjoy puppy    Continue Psychosocial Services  Follow up required by staff           Psychosocial Re-Evaluation:  Psychosocial Re-Evaluation     Row Name 08/03/23 1106 08/04/23 0750 08/29/23 1056 10/03/23 1111 11/02/23 1112     Psychosocial Re-Evaluation   Current issues with Current Depression;Current Sleep Concerns;Current Stress Concerns Current Depression;Current Sleep Concerns;Current Stress Concerns Current Depression;Current Sleep Concerns;Current Stress Concerns Current Depression;Current Sleep Concerns;Current Stress Concerns Current Depression;Current Sleep Concerns;Current Stress Concerns   Comments Reviewed patient health questionnaire (PHQ-9) with patient for follow up. Previously, patients score indicated signs/symptoms of depression.  Reviewed to see if patient is improving symptom wise while in program.  Score improved and patient states that it is because they have been getting out and feeling better with exercise.  He continues to struggle with sleep which leaves him tired at night. He is on trazadone, but only works some.  He is also having some vision disturbances that have him feeling dizzy and unsure.  He is meeting with his doctor about it next week. -- He is doing well in rehab. He has had some stress due to his aniamls and finding one dead and one missing not know what happend. He stated that he still does not sleep well. He is not doing anything for his sleep. Garrel is doing well in rehab. We re-evaluated his PHQ today and it has improved some, but still having some depression symptoms.  He continues to have issues with sleeping.  He is still frustrated by his breathign not improving.  He is not able to do everything that he wants as he has to stop to breathe.  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 He really wants his old normal.  [facility] and plans to talk with them about that. Garrel is doing well in rehab. He has been having a lot put on his plate that has been stressing him out. He recently has to put on of his  horses down. His has a family member in a different state that is not doing very well and is dealing with this stress and his wife has surgery and she is not able to get around at this time so he is also helping her. He is also fustrated about his breathing and it not really improving,   Expected Outcomes Short; Talk to doctor Long: Continue to exericse to help with sleep. -- talk to doctor about sleep sicne he is not sleeping well SHort: Talk to doctor about sleep and breathing Long; COnitnue to exercise for mental boost Short: talk to doctor about brathing longL conitnue to exercuse for mental boost and to have an outlet for stress   Interventions Encouraged to attend Pulmonary Rehabilitation for the exercise;Stress management  education;Relaxation education -- Encouraged to attend Pulmonary Rehabilitation for the exercise;Stress management education;Relaxation education Encouraged to attend Pulmonary Rehabilitation for the exercise;Stress management education;Relaxation education Encouraged to attend Pulmonary Rehabilitation for the exercise;Stress management education;Relaxation education   Continue Psychosocial Services  Follow up required by staff -- Follow up required by staff Follow up required by staff Follow up required by staff      Psychosocial Discharge (Final Psychosocial Re-Evaluation):  Psychosocial Re-Evaluation - 11/02/23 1112       Psychosocial Re-Evaluation   Current issues with Current Depression;Current Sleep Concerns;Current Stress Concerns    Comments Garrel is doing well in rehab. He has been having a lot put on his plate that has been stressing him out. He recently has to put on of his horses down. His has a family member in a different state that is not doing very well and is dealing with this stress and his wife has surgery and she is not able to get around at this time so he is also helping her. He is also fustrated about his breathing and it not really improving,    Expected  Outcomes Short: talk to doctor about brathing longL conitnue to exercuse for mental boost and to have an outlet for stress    Interventions Encouraged to attend Pulmonary Rehabilitation for the exercise;Stress management education;Relaxation education    Continue Psychosocial Services  Follow up required by staff           Education: Education Goals: Education classes will be provided on a weekly basis, covering required topics. Participant will state understanding/return demonstration of topics presented.  Learning Barriers/Preferences:  Learning Barriers/Preferences - 07/04/23 9171       Learning Barriers/Preferences   Learning Barriers Sight;Hearing   glasses, hearing aids (stopped working)   Acupuncturist None          Education Topics: How Lungs Work and Diseases: - Discuss the anatomy of the lungs and diseases that can affect the lungs, such as COPD. Flowsheet Row PULMONARY REHAB OTHER RESPIRATORY from 11/02/2023 in [facility]  Date 11/02/23  Educator HB  Instruction Review Code 1- Verbalizes Understanding    Exercise: -Discuss the importance of exercise, FITT principles of exercise, normal and abnormal responses to exercise, and how to exercise safely. Flowsheet Row PULMONARY REHAB OTHER RESPIRATORY from 10/20/2016 in [place] [facility]  Date 10/06/16  Educator Cathlyn Cary  Instruction Review Code 2- meets goals/outcomes    Environmental Irritants: -Discuss types of environmental irritants and how to limit exposure to environmental irritants. Flowsheet Row PULMONARY REHAB OTHER RESPIRATORY from 10/20/2016 in [place] [facility]  Date 10/13/16  Educator GC  Instruction Review Code 2- meets goals/outcomes    Meds/Inhalers and oxygen: - Discuss respiratory medications, definition of an inhaler and oxygen, and the proper way to use an inhaler and oxygen. Flowsheet Row PULMONARY REHAB OTHER RESPIRATORY from  10/20/2016 in [place] [facility]  Date 10/20/16  Educator GC  Instruction Review Code 2- meets goals/outcomes    Energy Saving Techniques: - Discuss methods to conserve energy and decrease shortness of breath when performing activities of daily living.  Flowsheet Row PULMONARY REHAB OTHER RESPIRATORY from 12/29/2016 in [place] [facility]  Date 10/27/16  Educator Cathlyn Cary  Instruction Review Code 2- meets goals/outcomes    Bronchial Hygiene / Breathing Techniques: - Discuss breathing mechanics, pursed-lip breathing technique,  proper posture, effective ways to clear airways, and other functional breathing techniques   Cleaning Equipment: -  Provides group verbal and written instruction about the health risks of elevated stress, cause of high stress, and healthy ways to reduce stress.   Nutrition I: Fats: - Discuss the types of cholesterol, what cholesterol does to the body, and how cholesterol levels can be controlled. Flowsheet Row PULMONARY REHAB OTHER RESPIRATORY from 11/02/2023 in [place] [facility]  Date 08/03/23  Educator HB  Instruction Review Code 1- Verbalizes Understanding    Nutrition II: Labels: -Discuss the different components of food labels and how to read food labels. Flowsheet Row PULMONARY REHAB OTHER RESPIRATORY from 11/02/2023 in [place] [facility]  Date 10/05/23  Educator HB  Instruction Review Code 1- Verbalizes Understanding    Respiratory Infections: - Discuss the signs and symptoms of respiratory infections, ways to prevent respiratory infections, and the importance of seeking medical treatment when having a respiratory infection. Flowsheet Row PULMONARY REHAB OTHER RESPIRATORY from 11/02/2023 in [place] [facility]  Date 07/06/23  Educator HB  Instruction Review Code 1- Verbalizes Understanding    Stress I: Signs and Symptoms: - Discuss the causes of stress, how stress may lead to  anxiety and depression, and ways to limit stress. Flowsheet Row PULMONARY REHAB OTHER RESPIRATORY from 11/02/2023 in [place] [facility]  Date 08/10/23  Educator HB  Instruction Review Code 1- Verbalizes Understanding    Stress II: Relaxation: -Discuss relaxation techniques to limit stress. Flowsheet Row PULMONARY REHAB OTHER RESPIRATORY from 11/02/2023 in [place] [facility]  Date 10/12/23  Educator jh  Instruction Review Code 1- Verbalizes Understanding    Oxygen for Home/Travel: - Discuss how to prepare for travel when on oxygen and proper ways to transport and store oxygen to ensure safety. Flowsheet Row PULMONARY REHAB OTHER RESPIRATORY from 10/20/2016 in [place] [facility]  Date 09/22/16  Educator GC  Instruction Review Code 2- meets goals/outcomes    Knowledge Questionnaire Score:  Knowledge Questionnaire Score - 07/04/23 1411       Knowledge Questionnaire Score   Pre Score 15/18          Core Components/Risk Factors/Patient Goals at Admission:  Personal Goals and Risk Factors at Admission - 07/04/23 1411       Core Components/Risk Factors/Patient Goals on Admission    Weight Management Yes;Weight Loss    Intervention Weight Management: Develop a combined nutrition and exercise program designed to reach desired caloric intake, while maintaining appropriate intake of nutrient and fiber, sodium and fats, and appropriate energy expenditure required for the weight goal.;Weight Management: Provide education and appropriate resources to help participant work on and attain dietary goals.;Weight Management/Obesity: Establish reasonable short term and long term weight goals.    Admit Weight 178 lb 8 oz (81 kg)    Goal Weight: Short Term 173 lb (78.5 kg)    Goal Weight: Long Term 170 lb (77.1 kg)    Expected Outcomes Short Term: Continue to assess and modify interventions until short term weight is achieved;Long Term: Adherence to  nutrition and physical activity/exercise program aimed toward attainment of established weight goal;Weight Loss: Understanding of general recommendations for a balanced deficit meal plan, which promotes 1-2 lb weight loss per week and includes a negative energy balance of [phone] kcal/d;Understanding recommendations for meals to include 15-35% energy as protein, 25-35% energy from fat, 35-60% energy from carbohydrates, less than 200mg  of dietary cholesterol, 20-35 gm of total fiber daily;Understanding of distribution of calorie intake throughout the day with the consumption of 4-5 meals/snacks    Improve shortness  of breath with ADL's Yes    Intervention Provide education, individualized exercise plan and daily activity instruction to help decrease symptoms of SOB with activities of daily living.    Expected Outcomes Short Term: Improve cardiorespiratory fitness to achieve a reduction of symptoms when performing ADLs;Long Term: Be able to perform more ADLs without symptoms or delay the onset of symptoms    Increase knowledge of respiratory medications and ability to use respiratory devices properly  Yes    Intervention Provide education and demonstration as needed of appropriate use of medications, inhalers, and oxygen therapy.    Expected Outcomes Short Term: Achieves understanding of medications use. Understands that oxygen is a medication prescribed by physician. Demonstrates appropriate use of inhaler and oxygen therapy.;Long Term: Maintain appropriate use of medications, inhalers, and oxygen therapy.    Diabetes Yes    Intervention Provide education about signs/symptoms and action to take for hypo/hyperglycemia.;Provide education about proper nutrition, including hydration, and aerobic/resistive exercise prescription along with prescribed medications to achieve blood glucose in normal ranges: Fasting glucose 65-99 mg/dL    Expected Outcomes Short Term: Participant verbalizes understanding of the  signs/symptoms and immediate care of hyper/hypoglycemia, proper foot care and importance of medication, aerobic/resistive exercise and nutrition plan for blood glucose control.;Long Term: Attainment of HbA1C < 7%.    Heart Failure Yes    Intervention Provide a combined exercise and nutrition program that is supplemented with education, support and counseling about heart failure. Directed toward relieving symptoms such as shortness of breath, decreased exercise tolerance, and extremity edema.    Expected Outcomes Improve functional capacity of life;Short term: Attendance in program 2-3 days a week with increased exercise capacity. Reported lower sodium intake. Reported increased fruit and vegetable intake. Reports medication compliance.;Short term: Daily weights obtained and reported for increase. Utilizing diuretic protocols set by physician.;Long term: Adoption of self-care skills and reduction of barriers for early signs and symptoms recognition and intervention leading to self-care maintenance.    Lipids Yes    Intervention Provide education and support for participant on nutrition & aerobic/resistive exercise along with prescribed medications to achieve LDL 70mg , HDL >40mg .    Expected Outcomes Short Term: Participant states understanding of desired cholesterol values and is compliant with medications prescribed. Participant is following exercise prescription and nutrition guidelines.;Long Term: Cholesterol controlled with medications as prescribed, with individualized exercise RX and with personalized nutrition plan. Value goals: LDL < 70mg , HDL > 40 mg.          Core Components/Risk Factors/Patient Goals Review:   Goals and Risk Factor Review     Row Name 08/04/23 0754 08/29/23 1107 10/03/23 1116 11/02/23 1117       Core Components/Risk Factors/Patient Goals Review   Personal Goals Review Weight Management/Obesity;Improve shortness of breath with ADL's;Other (P)  Weight  Management/Obesity;Improve shortness of breath with ADL's;Diabetes Weight Management/Obesity;Improve shortness of breath with ADL's;Diabetes Weight Management/Obesity;Improve shortness of breath with ADL's;Diabetes    Review Garrel is doing well in rehab. (P)  Garrel has been doing well in rehab. He has been checking his sugar and it has been WNL. He has started gettting dizzy latly when changing positions during the mid mornings it tends to happen. He has been to his vistion checked by [facility] and regular doctor but he really hasnt gotten any answers. He has an appointment in June. Garrel is doing well in rehab.  His weight is holding steady.  He is still struggling with his breathing and it really limits his ability to  do things as he would like.  His sugars are doing well and pressures are too.  He did note that he has had some dizzy/blurry spells recently (none this week).  [facility] and [facility] appt coming up next week to get checked over.  He will take his numbers with him to his appt on Thursday. He also still has no appetitie. Garrel is doing well in rehab. His weight is continueing to hold steady. 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 He has an eye appointment coming this next week    Expected Outcomes -- continue to check and monitor sugar keep an eye on his BP and water intake as well when he feels dizzy spells Short: Talk to doctor about breathing, appetite, and blurriness Long: Contineu to montior risk factors. Short: Talk to doctor about breathing, appetite Long: Contineu to montior risk factors.       Core Components/Risk Factors/Patient Goals at Discharge (Final Review):   Goals and Risk Factor Review - 11/02/23 1117       Core Components/Risk Factors/Patient Goals Review   Personal Goals Review Weight Management/Obesity;Improve shortness of breath with  ADL's;Diabetes    Review Garrel is doing well in rehab. His weight is continueing to hold steady. 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 He has an eye appointment coming this next week    Expected Outcomes Short: Talk to doctor about breathing, appetite Long: Contineu to montior risk factors.          ITP Comments:  ITP Comments     Row Name 07/04/23 1051 07/26/23 0940 08/23/23 1137 09/20/23 1357 10/18/23 1024   ITP Comments Patient attend orientation today.  Patient is attending Pulmonary Rehabilitation Program.  Documentation for diagnosis can be found in 06/19/23 and 06/23/23.  Reviewed medical chart, RPE/RPD, gym safety, and program guidelines.  Patient was fitted to equipment they will be using during rehab.  Patient is scheduled to start exercise on Thursday 07/06/23 at 1030.   Initial ITP created and sent for review and signature by Dr. Anton Kelp, Medical Director for Pulmonary Rehabilitation Program. 30 day review completed. ITP sent to Dr.Jehanzeb Memon, Medical Director of  Pulmonary Rehab. Continue with ITP unless changes are made by physician. Pt is newer to program. 30 day review completed. ITP sent to Dr.Jehanzeb Memon, Medical Director of  Pulmonary Rehab. Continue with ITP unless changes are made by physician. 30 day review completed. ITP sent to Dr.Jehanzeb Memon, Medical Director of  Pulmonary Rehab. Continue with ITP unless changes are made by physician. 30 day review completed. ITP sent to Dr.Jehanzeb Memon, Medical Director of  Pulmonary Rehab. Continue with ITP unless changes are made by physician.    Row Name 11/15/23 1022 12/05/23 1037         ITP Comments 30 day review completed. ITP sent to Dr.Jehanzeb Memon, Medical Director of  Pulmonary Rehab. Continue with ITP unless changes are made by physician. Jawon graduated today from  rehab with 36 sessions  completed.  Details of the patient's exercise prescription and what He needs to do in order to continue the prescription and progress were discussed with patient.  Patient was given a copy of prescription and goals.  Patient  verbalized understanding. Demetruis plans to continue to exercise by walking around his [place] .         Comments: Discharge ITP

## 2023-12-05 NOTE — Progress Notes (Signed)
 Daily Session Note  Patient Details  Name: Caleb Butler MRN: [id number] Date of Birth: 1946/04/20 Referring Provider:   Flowsheet Row PULMONARY REHAB OTHER RESP ORIENTATION from 07/04/2023 in [facility] CARDIAC REHABILITATION  Referring Provider Alvan Carrier MD  [facility] Pulmonologist: Dr. Brown    Encounter Date: 12/05/2023  Check In:  Session Check In - 12/05/23 1030       Check-In   Supervising physician immediately available to respond to emergencies See telemetry face sheet for immediately available MD    Location [facility]    Staff Present Powell Benders, BS, Exercise Physiologist;Jessica Vonzell, MA, RCEP, CCRP, CCET;Brittany Jackquline, BSN, RN, WTA-C    Virtual Visit No    Medication changes reported     No    Fall or balance concerns reported    No    Tobacco Cessation No Change    Warm-up and Cool-down Performed on first and last piece of equipment    Resistance Training Performed Yes    VAD Patient? No    PAD/SET Patient? No      Pain Assessment   Currently in Pain? No/denies    Multiple Pain Sites No          Capillary Blood Glucose: No results found for this or any previous visit (from the past 24 hours).    Social History   Tobacco Use  Smoking Status Never  Smokeless Tobacco Never    Goals Met:  Independence with exercise equipment Using PLB without cueing & demonstrates good technique Exercise tolerated well No report of concerns or symptoms today Strength training completed today  Goals Unmet:  Not Applicable  Comments:  [name] graduated today from  rehab with 36 sessions completed.  Details of the patient's exercise prescription and what He needs to do in order to continue the prescription and progress were discussed with patient.  Patient was given a copy of prescription and goals.  Patient verbalized understanding. 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

## 2023-12-05 NOTE — Progress Notes (Signed)
 Discharge Progress Report  Patient Details  Name: Caleb Butler MRN: [id number] Date of Birth: 10/02/1945 Referring Provider:   Flowsheet Row PULMONARY REHAB OTHER RESP ORIENTATION from 07/04/2023 in [facility] CARDIAC REHABILITATION  Referring Provider Alvan Carrier MD  [Primary Pulmonologist: Dr. Brown     Number of Visits: 36  Reason for Discharge:  Patient reached a stable level of exercise. Patient independent in their exercise. Patient has met program and personal goals.  Smoking History:  Social History   Tobacco Use  Smoking Status Never  Smokeless Tobacco Never    Diagnosis:  ILD (interstitial lung disease) (HCC)  SOB (shortness of breath)  ADL UCSD:  Pulmonary Assessment Scores     Row Name 07/04/23 1413         ADL UCSD   ADL Phase Entry     SOB Score total 25     Rest 0     Walk 1     Stairs 3     Bath 0     Dress 1     Shop 1       CAT Score   CAT Score 16       mMRC Score   mMRC Score 2        Initial Exercise Prescription:  Initial Exercise Prescription - 07/04/23 1100       Date of Initial Exercise RX and Referring Provider   Date 07/04/23    Referring Provider Alvan Carrier MD   Primary Pulmonologist: Dr. Theophilus     Oxygen   Maintain Oxygen Saturation 88% or higher      Treadmill   MPH 2    Grade 0.5    Minutes 15    METs 2.67      REL-XR   Level 2    Speed 50    Minutes 15    METs 2.5      Prescription Details   Frequency (times per week) 2    Duration Progress to 30 minutes of continuous aerobic without signs/symptoms of physical distress      Intensity   THRR 40-80% of Max Heartrate 104-130    Ratings of Perceived Exertion 11-13    Perceived Dyspnea 0-4      Progression   Progression Continue to progress workloads to maintain intensity without signs/symptoms of physical distress.      Resistance Training   Training Prescription Yes    Weight 4 lb    Reps 10-15          Discharge  Exercise Prescription (Final Exercise Prescription Changes):  Exercise Prescription Changes - 11/02/23 1500       Response to Exercise   Blood Pressure (Admit) 100/58    Blood Pressure (Exit) 94/60    Heart Rate (Admit) 78 bpm    Heart Rate (Exercise) 103 bpm    Heart Rate (Exit) 86 bpm    Oxygen Saturation (Admit) 97 %    Oxygen Saturation (Exercise) 95 %    Oxygen Saturation (Exit) 96 %    Rating of Perceived Exertion (Exercise) 13    Perceived Dyspnea (Exercise) 3    Duration Continue with 30 min of aerobic exercise without signs/symptoms of physical distress.    Intensity THRR unchanged      Progression   Progression Continue to progress workloads to maintain intensity without signs/symptoms of physical distress.      Resistance Training   Training Prescription Yes    Weight 5    Reps 10-15  Treadmill   MPH 2.5    Grade 2    Minutes 15    METs 3.6      REL-XR   Level 1    Speed 68    Minutes 15    METs 5.1      Oxygen   Maintain Oxygen Saturation 88% or higher          Functional Capacity:  6 Minute Walk     Row Name 07/04/23 1052 10/26/23 1114       6 Minute Walk   Phase Initial Discharge    Distance 1140 feet 1330 feet    Distance Feet Change -- 190 ft    Walk Time 6 minutes 6 minutes    # of Rest Breaks 0 0    MPH 2.16 2.51    METS 2.14 2.46    RPE 11 12    Perceived Dyspnea  1 2    VO2 Peak 7.49 8.6    Symptoms Yes (comment) No    Comments SOB --    Resting HR 78 bpm 82 bpm    Resting BP 116/62 124/66    Resting Oxygen Saturation  96 % 94 %    Exercise Oxygen Saturation  during 6 min walk 95 % 90 %    Max Ex. HR 89 bpm 97 bpm    Max Ex. BP 126/64 120/60    2 Minute Post BP 118/62 116/62      Interval HR   1 Minute HR 86 80    2 Minute HR 88 86    3 Minute HR 87 85    4 Minute HR 88 88    5 Minute HR 79 86    6 Minute HR 89 91    2 Minute Post HR 81 78    Interval Heart Rate? Yes Yes      Interval Oxygen   Interval Oxygen?  Yes Yes    Baseline Oxygen Saturation % 96 % 94 %    1 Minute Oxygen Saturation % 95 % 91 %    1 Minute Liters of Oxygen 0 L  Room Air 0 L    2 Minute Oxygen Saturation % 95 % 90 %    2 Minute Liters of Oxygen 0 L 0 L    3 Minute Oxygen Saturation % 95 % 95 %    3 Minute Liters of Oxygen 0 L 0 L    4 Minute Oxygen Saturation % 96 % 90 %    4 Minute Liters of Oxygen 0 L 0 L    5 Minute Oxygen Saturation % 95 % 95 %    5 Minute Liters of Oxygen 0 L 0 L    6 Minute Oxygen Saturation % 96 % 98 %    6 Minute Liters of Oxygen 0 L 0 L    2 Minute Post Oxygen Saturation % 96 % 98 %    2 Minute Post Liters of Oxygen 0 L 0 L       Psychological, QOL, Others - Outcomes: PHQ 2/9:    11/24/2023    9:19 AM 10/03/2023   11:11 AM 08/03/2023   11:05 AM 07/04/2023    2:14 PM 05/20/2022    2:19 PM  Depression screen PHQ 2/9  Decreased Interest 2 1 2 1  0  Down, Depressed, Hopeless 2 0 0 0 0  PHQ - 2 Score 4 1 2 1  0  Altered sleeping 3 0 0 1  Tired, decreased energy 2 2 2 2    Change in appetite 2 2 1 2    Feeling bad or failure about yourself  1 1 0 0   Trouble concentrating 0 0 1 1   Moving slowly or fidgety/restless 0 1 0 0   Suicidal thoughts 0 0 0 0   PHQ-9 Score 12 7 6 7    Difficult doing work/chores Very difficult Somewhat difficult Somewhat difficult      Nutrition & Weight - Outcomes:  Pre Biometrics - 07/04/23 1104       Pre Biometrics   Height 5' 8 (1.727 m)    Weight 81 kg    Waist Circumference 38.5 inches    Hip Circumference 38.5 inches    Waist to Hip Ratio 1 %    BMI (Calculated) 27.15    Grip Strength 26.8 kg    Single Leg Stand 30 seconds          Post Biometrics - 10/26/23 1116        Post  Biometrics   Height 5' 8 (1.727 m)    Weight 80.5 kg    Waist Circumference 39 inches    Hip Circumference 38 inches    Waist to Hip Ratio 1.03 %    BMI (Calculated) 26.99    Grip Strength 10.6 kg    Single Leg Stand 30 seconds          Score:  Knowledge  Questionnaire Score - 07/04/23 1411       Knowledge Questionnaire Score   Pre Score 15/18          Goals reviewed with patient; copy given to patient.

## 2023-12-26 ENCOUNTER — Telehealth: Payer: Self-pay

## 2023-12-26 NOTE — Telephone Encounter (Signed)
 Copied from CRM [id number]. Topic: General - Call Back - No Documentation >> Dec 26, 2023  2:45 PM Berwyn MATSU wrote: Reason for CRM: Patient called back stating that he had a missed call from Dr. Clara office.  No notes and no documentation.   May you please assist.

## 2023-12-30 ENCOUNTER — Other Ambulatory Visit: Payer: Self-pay | Admitting: Family Medicine

## 2023-12-30 DIAGNOSIS — I25119 Atherosclerotic heart disease of native coronary artery with unspecified angina pectoris: Secondary | ICD-10-CM

## 2024-01-15 ENCOUNTER — Emergency Department (HOSPITAL_COMMUNITY)
Admission: EM | Admit: 2024-01-15 | Discharge: 2024-01-15 | Disposition: A | Discharge status: Home / Self Care | Attending: Emergency Medicine | Admitting: Emergency Medicine

## 2024-01-15 ENCOUNTER — Emergency Department (HOSPITAL_COMMUNITY)

## 2024-01-15 ENCOUNTER — Other Ambulatory Visit: Payer: Self-pay

## 2024-01-15 DIAGNOSIS — R000 Tachycardia, unspecified: Secondary | ICD-10-CM | POA: Insufficient documentation

## 2024-01-15 DIAGNOSIS — R0602 Shortness of breath: Secondary | ICD-10-CM | POA: Diagnosis present

## 2024-01-15 DIAGNOSIS — Z7982 Long term (current) use of aspirin: Secondary | ICD-10-CM | POA: Diagnosis not present

## 2024-01-15 DIAGNOSIS — Z7902 Long term (current) use of antithrombotics/antiplatelets: Secondary | ICD-10-CM | POA: Diagnosis not present

## 2024-01-15 LAB — CBC WITH DIFFERENTIAL/PLATELET
Abs Immature Granulocytes: 0.04 K/uL (ref 0.00–0.07)
Basophils Absolute: 0.1 K/uL (ref 0.0–0.1)
Basophils Relative: 1 %
Eosinophils Absolute: 0.4 K/uL (ref 0.0–0.5)
Eosinophils Relative: 5 %
HCT: 48.8 % (ref 39.0–52.0)
Hemoglobin: 17.1 g/dL — ABNORMAL HIGH (ref 13.0–17.0)
Immature Granulocytes: 0 %
Lymphocytes Relative: 17 %
Lymphs Abs: 1.6 K/uL (ref 0.7–4.0)
MCH: 32.8 pg (ref 26.0–34.0)
MCHC: 35 g/dL (ref 30.0–36.0)
MCV: 93.7 fL (ref 80.0–100.0)
Monocytes Absolute: 0.6 K/uL (ref 0.1–1.0)
Monocytes Relative: 6 %
Neutro Abs: 6.6 K/uL (ref 1.7–7.7)
Neutrophils Relative %: 71 %
Platelets: 234 K/uL (ref 150–400)
RBC: 5.21 MIL/uL (ref 4.22–5.81)
RDW: 14.1 % (ref 11.5–15.5)
WBC: 9.4 K/uL (ref 4.0–10.5)
nRBC: 0 % (ref 0.0–0.2)

## 2024-01-15 LAB — COMPREHENSIVE METABOLIC PANEL WITH GFR
ALT: 21 U/L (ref 0–44)
AST: 24 U/L (ref 15–41)
Albumin: 4.3 g/dL (ref 3.5–5.0)
Alkaline Phosphatase: 68 U/L (ref 38–126)
Anion gap: 15 (ref 5–15)
BUN: 23 mg/dL (ref 8–23)
CO2: 20 mmol/L — ABNORMAL LOW (ref 22–32)
Calcium: 9.6 mg/dL (ref 8.9–10.3)
Chloride: 99 mmol/L (ref 98–111)
Creatinine, Ser: 1.44 mg/dL — ABNORMAL HIGH (ref 0.61–1.24)
GFR, Estimated: 50 mL/min — ABNORMAL LOW (ref >60)
Glucose, Bld: 264 mg/dL — ABNORMAL HIGH (ref 70–99)
Potassium: 3.8 mmol/L (ref 3.5–5.1)
Sodium: 134 mmol/L — ABNORMAL LOW (ref 135–145)
Total Bilirubin: 1 mg/dL (ref 0.0–1.2)
Total Protein: 8 g/dL (ref 6.5–8.1)

## 2024-01-15 LAB — RESP PANEL BY RT-PCR (RSV, FLU A&B, COVID)  RVPGX2
Influenza A by PCR: NEGATIVE
Influenza B by PCR: NEGATIVE
Resp Syncytial Virus by PCR: NEGATIVE
SARS Coronavirus 2 by RT PCR: NEGATIVE

## 2024-01-15 LAB — BRAIN NATRIURETIC PEPTIDE: B Natriuretic Peptide: 45 pg/mL (ref 0.0–100.0)

## 2024-01-15 MED ORDER — PREDNISONE 20 MG PO TABS: 40.0000 mg | ORAL_TABLET | Freq: Every day | ORAL | 0 refills | Status: DC

## 2024-01-15 MED ORDER — ALBUTEROL SULFATE (2.5 MG/3ML) 0.083% IN NEBU: 10.0000 mg/h | INHALATION_SOLUTION | Freq: Once | RESPIRATORY_TRACT | Status: DC

## 2024-01-15 MED ORDER — METHYLPREDNISOLONE SODIUM SUCC 125 MG IJ SOLR
125.0000 mg | Freq: Once | INTRAMUSCULAR | Status: AC
  Administered 2024-01-15: 125 mg via INTRAVENOUS
  Filled 2024-01-15: qty 2

## 2024-01-15 MED ORDER — ALBUTEROL SULFATE (5 MG/ML) 0.5% IN NEBU: 2.5000 mg | INHALATION_SOLUTION | Freq: Four times a day (QID) | RESPIRATORY_TRACT | 12 refills | Status: AC | PRN

## 2024-01-15 MED ORDER — ALBUTEROL SULFATE (2.5 MG/3ML) 0.083% IN NEBU
INHALATION_SOLUTION | RESPIRATORY_TRACT | Status: AC
  Administered 2024-01-15: 10 mg
  Filled 2024-01-15: qty 12

## 2024-01-15 NOTE — Discharge Instructions (Addendum)
 As we discussed, interstitial lung disease can be challenging to manage.  [facility] is [facility] with [facility].  Dr. Alaine is now practicing with [facility].  Our pulmonology team is located in [place], please call for follow-up appointment.  For the next 2 days use the albuterol  nebulizer every 4 hours.  You may then use it as needed.  Please obtain and take the steroids as well.  Return here for concerning changes in your condition.

## 2024-01-15 NOTE — ED Notes (Signed)
 Nebulizer completed. MD at bedside.

## 2024-01-15 NOTE — ED Provider Notes (Signed)
  EMERGENCY DEPARTMENT AT [facility] Provider Note   CSN: [id number] Arrival date & time: 01/15/24  9045     Patient presents with: No chief complaint on file.   Caleb Butler is a 78 y.o. male.   HPI Pt arrived via [facility]. EMS called out due to Shortness of breath. Pt presented to EMS with obvious difficulty breathing. Before nebulizer pt was at 96%. Pt given 2.5 of albuterol . Pt came up to 100% after treatment. Pt at 100% on room air. Pt states he does take ozempic  0.5. Pt on blood thinner for previous stent placement. Lung sounds slight wheezing bilat upon auscultation. Pt is still having heavy breathing and difficulty talking.   He notes a history of lung disease though he is unsure of what the diagnosis is.  He is scheduled to follow-up with specialist via the [facility].     Prior to Admission medications   Medication Sig Start Date End Date Taking? Authorizing Provider  albuterol  (PROVENTIL ) (5 MG/ML) 0.5% nebulizer solution Take 0.5 mLs (2.5 mg total) by nebulization every 6 (six) hours as needed for wheezing or shortness of breath. 01/15/24  Yes Garrick Charleston, MD  predniSONE  (DELTASONE ) 20 MG tablet Take 2 tablets (40 mg total) by mouth daily with breakfast. For the next four days 01/15/24  Yes Garrick Charleston, MD  Accu-Chek FastClix Lancets MISC USE TO CHECK BLOOD SUGAR TWICE DAILY E11.9 06/01/23   [name], MD  acetaminophen  (TYLENOL ) 500 MG tablet Take 500-1,000 mg by mouth daily as needed for moderate pain or headache.    [provider]  aspirin  81 MG EC tablet Take 81 mg by mouth at bedtime.     [provider]  blood glucose meter kit and supplies Dispense based on patient and insurance preference. Use up to four times daily as directed. (FOR ICD-10 E10.9, E11.9). 02/11/20   [name], MD  Blood Glucose Monitoring Suppl DEVI 1 each by Does not apply route in the morning, at noon, and at bedtime. May substitute to any  manufacturer covered by patient's insurance. Needs Onetouch by [employer] 04/04/23   [name], MD  Capsaicin 0.1 % CREA Apply topically as needed. 10/27/20   [provider]  clopidogrel  (PLAVIX ) 75 MG tablet TAKE 1 TABLET BY MOUTH EVERY DAY 01/01/24   [name], MD  famotidine (PEPCID) 40 MG tablet Take 40 mg by mouth daily.    [provider]  levothyroxine  (SYNTHROID ) 100 MCG tablet Take 50 mcg by mouth daily before breakfast.    [provider]  levothyroxine  (SYNTHROID ) 75 MCG tablet Take 1 tablet (75 mcg total) by mouth daily. 04/04/23   [name], MD  mometasone  (ELOCON ) 0.1 % ointment Apply topically daily. 02/11/19   [name], MD  Multiple Vitamin (MULTIVITAMIN WITH MINERALS) TABS tablet Take 1 tablet by mouth daily.    [provider]  rosuvastatin  (CRESTOR ) 20 MG tablet TAKE 1 TABLET BY MOUTH EVERY DAY 05/05/23   Alvan Dorn FALCON, MD  Semaglutide ,0.25 or 0.5MG /DOS, (OZEMPIC , 0.25 OR 0.5 MG/DOSE,) 2 MG/1.5ML SOPN Inject 0.5 mg into the skin once a week. 05/14/20   [name], MD  Tiotropium Bromide-Olodaterol (STIOLTO RESPIMAT ) 2.5-2.5 MCG/ACT AERS Inhale 2 puffs into the lungs daily. 03/21/23   Parrett, Madelin RAMAN, NP  traZODone  (DESYREL ) 50 MG tablet Take 1 tablet (50 mg total) by mouth at bedtime. Patient not taking: Reported on 11/24/2023 07/21/23   [name], MD  vitamin B-12 (CYANOCOBALAMIN) 1000 MCG tablet Take 1,000 mcg by mouth daily.    [provider]    Allergies: Bee venom, Ceclor [cefaclor], Ciprofloxacin, Jardiance  [empagliflozin ], and Metformin  and related    Review of Systems  Updated Vital Signs BP 114/84   Pulse 100   Temp 97.7 F (36.5 C) (Oral)   Resp (!) 24   Ht 5' 8 (1.727 m)   Wt 80.4 kg   SpO2 98%   BMI 26.95 kg/m   Physical Exam Vitals and nursing note reviewed.  Constitutional:      General: He is not in acute distress.    Appearance: He is well-developed.   HENT:     Head: Normocephalic and atraumatic.  Eyes:     Conjunctiva/sclera: Conjunctivae normal.  Cardiovascular:     Rate and Rhythm: Regular rhythm. Tachycardia present.  Pulmonary:     Effort: Tachypnea and accessory muscle usage present.  Abdominal:     General: There is no distension.  Skin:    General: Skin is warm and dry.  Neurological:     Mental Status: He is alert and oriented to person, place, and time.     (all labs ordered are listed, but only abnormal results are displayed) Labs Reviewed  COMPREHENSIVE METABOLIC PANEL WITH GFR - Abnormal; Notable for the following components:      Result Value   Sodium 134 (*)    CO2 20 (*)    Glucose, Bld 264 (*)    Creatinine, Ser 1.44 (*)    GFR, Estimated 50 (*)    All other components within normal limits  CBC WITH DIFFERENTIAL/PLATELET - Abnormal; Notable for the following components:   Hemoglobin 17.1 (*)    All other components within normal limits  RESP PANEL BY RT-PCR (RSV, FLU A&B, COVID)  RVPGX2  BRAIN NATRIURETIC PEPTIDE    EKG: EKG Interpretation Date/Time:  Monday January 15 2024 10:08:12 EDT Ventricular Rate:  74 PR Interval:  207 QRS Duration:  103 QT Interval:  408 QTC Calculation: 453 R Axis:   46  Text Interpretation: Sinus rhythm Confirmed by Garrick Charleston [id number] on 01/15/2024 10:15:16 AM  Radiology: [facility] Chest Port 1 View Result Date: 01/15/2024 CLINICAL DATA:  Shortness of breath. EXAM: PORTABLE CHEST 1 VIEW COMPARISON:  09/18/2022 and CT chest 05/12/2023. FINDINGS: Trachea is midline. Heart size stable. Minimal streaky atelectasis or scarring in the lung bases. Lungs are somewhat low in volume but otherwise clear. No fluid. IMPRESSION: Somewhat low lung volumes with minimal streaky atelectasis or scarring in the lung bases. Electronically Signed   By: Newell Eke M.D.   On: 01/15/2024 10:36     Procedures   Medications Ordered in the ED  albuterol  (PROVENTIL ) (2.5 MG/3ML) 0.083%  nebulizer solution (10 mg/hr Nebulization Not Given 01/15/24 1028)  methylPREDNISolone  sodium succinate (SOLU-MEDROL ) 125 mg/2 mL injection 125 mg (125 mg Intravenous Given 01/15/24 1024)  albuterol  (PROVENTIL ) (2.5 MG/3ML) 0.083% nebulizer solution (10 mg  Given 01/15/24 1028)                                    Medical Decision Making Patient presents with shortness of breath in context of known ongoing respiratory disease, possible interstitial lung. Patient is awake, alert, afebrile, broad differential including ILD flare, pneumonia, bacteremia, sepsis, atypical ACS. Patient received some bronchodilators and route, additional on arrival as he had persistent increased work of breathing. Pulse ox  100% 2 L nasal cannula abnormal Cardiac 70 sinus normal  Amount and/or Complexity of Data Reviewed Independent Historian: EMS External Data Reviewed: notes. Labs: ordered. Decision-making details documented in ED Course. Radiology: ordered and independent interpretation performed. Decision-making details documented in ED Course. ECG/medicine tests: ordered and independent interpretation performed. Decision-making details documented in ED Course.  Risk Prescription drug management. Decision regarding hospitalization. Diagnosis or treatment significantly limited by social determinants of health.   12:11 PM On repeat exam patient is awake, alert, no oxygen requirement, speaking much more clearly. He has received multiple bronchodilators, steroids,.  Wife now present.  We discussed patient's diagnosis of ILD, his ongoing efforts for outpatient management.  He notes that he was previously seen by a pulmonologist in [place] who has now left the system.  As above he is scheduled for outpatient follow-up in the [facility] system at some point in a month or so.  Wife requests nebulizer machine, this will be accommodated, the patient will receive additional information on and agrees we will follow-up options,  [facility]. Oophorectomy, sepsis, evidence for pneumonia, suspicion for ILD related respiratory difficulty.     Final diagnoses:  SOB (shortness of breath)    ED Discharge Orders          Ordered    For home use only DME Nebulizer machine        01/15/24 1210    albuterol  (PROVENTIL ) (5 MG/ML) 0.5% nebulizer solution  Every 6 hours PRN        01/15/24 1211    predniSONE  (DELTASONE ) 20 MG tablet  Daily with breakfast        01/15/24 1211               Garrick Charleston, MD 01/15/24 1212

## 2024-01-15 NOTE — ED Triage Notes (Addendum)
 Pt arrived via [facility]. EMS called out due to Shortness of breath. Pt presented to EMS with obvious difficulty breathing. Before nebulizer pt was at 96%. Pt given 2.5 of albuterol . Pt came up to 100% after treatment. Pt at 100% on room air. Pt states he does take ozempic  0.5. Pt on blood thinner for previous stent placement. Lung sounds slight wheezing bilat upon auscultation. Pt is still having heavy breathing and difficulty talking.

## 2024-01-22 ENCOUNTER — Other Ambulatory Visit (HOSPITAL_COMMUNITY): Payer: Self-pay | Admitting: Gastroenterology

## 2024-01-22 DIAGNOSIS — R1032 Left lower quadrant pain: Secondary | ICD-10-CM

## 2024-01-23 ENCOUNTER — Ambulatory Visit (INDEPENDENT_AMBULATORY_CARE_PROVIDER_SITE_OTHER): Admitting: Pulmonary Disease

## 2024-01-23 ENCOUNTER — Encounter: Payer: Self-pay | Admitting: Pulmonary Disease

## 2024-01-23 VITALS — BP 120/76 | HR 65 | Temp 97.5°F | Ht 69.0 in | Wt 173.0 lb

## 2024-01-23 DIAGNOSIS — J849 Interstitial pulmonary disease, unspecified: Secondary | ICD-10-CM

## 2024-01-23 DIAGNOSIS — J4541 Moderate persistent asthma with (acute) exacerbation: Secondary | ICD-10-CM

## 2024-01-23 DIAGNOSIS — R0602 Shortness of breath: Secondary | ICD-10-CM

## 2024-01-23 LAB — POCT EXHALED NITRIC OXIDE: FeNO level (ppb): 34

## 2024-01-23 MED ORDER — BUDESONIDE-FORMOTEROL FUMARATE 160-4.5 MCG/ACT IN AERO: 2.0000 | INHALATION_SPRAY | Freq: Two times a day (BID) | RESPIRATORY_TRACT | 11 refills | Status: AC

## 2024-01-23 MED ORDER — IPRATROPIUM-ALBUTEROL 0.5-2.5 (3) MG/3ML IN SOLN: 3.0000 mL | RESPIRATORY_TRACT | Status: AC

## 2024-01-23 NOTE — Patient Instructions (Addendum)
  VISIT SUMMARY: You visited us  today due to experiencing shortness of breath, which has been affecting your daily activities and work. We discussed your recent emergency room visit, where you were treated with a nebulizer, and reviewed your pulmonary imaging and lab results. We also went over your cardiac history and current medications.  YOUR PLAN: -SHORTNESS OF BREATH, LIKELY DUE TO SUSPECTED ASTHMA: Your shortness of breath is likely due to asthma, which is a condition where your airways narrow and swell, making it difficult to breathe. We have prescribed a Symbicort  inhaler for you to use twice daily and recommended continuing nebulizer treatments as needed.  Will order a CT scan for evaluation of the lung and order nebulizers  -CORONARY ARTERY DISEASE, STATUS POST STENT PLACEMENT: You have a history of coronary artery disease and had a stent placed five years ago. Continue with your regular follow-ups with your cardiologist every six months.  INSTRUCTIONS: Please start using the Symbicort  inhaler twice daily and continue with nebulizer treatments as needed. Follow up with your cardiologist as scheduled every six months. If your symptoms worsen or you experience new symptoms, please contact our office immediately.

## 2024-01-23 NOTE — Progress Notes (Signed)
 Caleb Butler    [id number]    November 07, 1945  Primary Care Physician:Pickard, [name], MD  Referring Physician: [name], MD [name] [place],  [place] [address]  Chief complaint: Follow-up for interstitial lung disease, asthma with exacerbation  HPI: 78 y.o. who  has a past medical history of Anxiety, Aortic aneurysm (HCC), Arthritis, CAD (coronary artery disease), Chronic lower back pain, GERD (gastroesophageal reflux disease), Hiatal hernia, High cholesterol, Hypothyroidism, IBS (irritable bowel syndrome), Numbness and tingling of right lower extremity, PMR (polymyalgia rheumatica) (HCC), Stroke (HCC), and Type 2 diabetes mellitus (HCC).   Discussed the use of AI scribe software for clinical note transcription with the patient, who gave verbal consent to proceed.  The patient, with a history of coronary artery disease and stents, presents with shortness of breath, particularly with exertion. He describes difficulty climbing stairs and walking due to breathlessness, but reports feeling 'pretty good at rest.' He denies significant cough or sputum production. He has been on Stiolto inhaler for two months, but reports no noticeable improvement in symptoms. He also reports nosebleeds since starting Zyrtec, which was prescribed for blocked ears.  He was previously followed by Dr. McQuaid with CT findings of mild interstitial lung disease that has been stable for many years  He had a cardiopulmonary exercise test in 2017 with findings suggestive of pulmonary vascular disease however follow-up right heart cath with no evidence of pulmonary hypertension.  Coronary artery disease is stable  The patient has a history of exposure to asbestos and Agent Orange, but denies significant smoking history. 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 He denies any family history of lung disease.   Interim history: Discussed the use of AI scribe software for clinical note transcription with the patient, who gave verbal consent to proceed.  History of Present Illness Caleb Butler is a 78 year old male with coronary artery disease who presents with shortness of breath.  Dyspnea - Acute episode of shortness of breath at work last Monday, associated with cyanosis ('turned purple' per coworkers) - Required emergency department evaluation and albuterol  treatment.  All emergency room records reviewed in detail. 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) - No associated chest pain - Difficulty breathing during physical activity  Pulmonary imaging and laboratory findings - Chest x-ray in emergency department showed low lung volumes - CT scan last year show mild lung changes with questionable of interstitial lung disease, but no significant progression - Tests for influenza, COVID-19, and RSV were negative - Kidney function slightly elevated - BNP within normal limits  Pulmonary medication use - Currently using nebulizer twice daily since acute episode - Previously used inhalers including Stiolto, which were ineffective - Not currently on any inhaler therapy  Cardiac history and evaluation - Coronary artery disease with stent placement five years ago - No chest pain - Regular cardiology follow-up every six months - Completed cardiac rehabilitation without improvement in dyspnea - Last stress test in 2021 - Cardiac ultrasound performed last year   Relevant pulmonary history Pets: 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   Exposures: He  was exposed to asbestos and agent orange while in  [employer].  No mold, hot tub, Jacuzzi.  No feather pillows or comforters Smoking history: Never smoker Travel history: Originally from [place] .  No significant recent travel Relevant family history: No family history of lung disease  Outpatient Encounter Medications as of 01/23/2024  Medication Sig   Accu-Chek FastClix Lancets MISC USE TO CHECK BLOOD SUGAR TWICE DAILY E11.9   acetaminophen  (TYLENOL ) 500 MG tablet Take 500-1,000 mg by mouth daily as needed for moderate pain or headache.   albuterol  (PROVENTIL ) (5 MG/ML) 0.5% nebulizer solution Take 0.5 mLs (2.5 mg total) by nebulization every 6 (six) hours as needed for wheezing or shortness of breath.   aspirin  81 MG EC tablet Take 81 mg by mouth at bedtime.    blood glucose meter kit and supplies Dispense based on patient and insurance preference. Use up to four times daily as directed. (FOR ICD-10 E10.9, E11.9).   Blood Glucose Monitoring Suppl DEVI 1 each by Does not apply route in the morning, at noon, and at bedtime. May substitute to any manufacturer covered by patient's insurance. Needs Onetouch by [employer]   Capsaicin 0.1 % CREA Apply topically as needed.   clopidogrel  (PLAVIX ) 75 MG tablet TAKE 1 TABLET BY MOUTH EVERY DAY   famotidine (PEPCID) 40 MG tablet Take 40 mg by mouth daily.   levothyroxine  (SYNTHROID ) 100 MCG tablet Take 50 mcg by mouth daily before breakfast.   levothyroxine  (SYNTHROID ) 75 MCG tablet Take 1 tablet (75 mcg total) by mouth daily.   mometasone  (ELOCON ) 0.1 % ointment Apply topically daily.   Multiple Vitamin (MULTIVITAMIN WITH MINERALS) TABS tablet Take 1 tablet by mouth daily.   predniSONE  (DELTASONE ) 20 MG tablet Take 2 tablets (40 mg total) by mouth daily with breakfast. For the next four days   rosuvastatin  (CRESTOR ) 20 MG tablet TAKE 1 TABLET BY MOUTH EVERY DAY   Semaglutide ,0.25 or 0.5MG /DOS, (OZEMPIC , 0.25 OR 0.5 MG/DOSE,) 2 MG/1.5ML SOPN Inject 0.5 mg into the skin once a week.   Tiotropium  Bromide-Olodaterol (STIOLTO RESPIMAT ) 2.5-2.5 MCG/ACT AERS Inhale 2 puffs into the lungs daily.   traZODone  (DESYREL ) 50 MG tablet Take 1 tablet (50 mg total) by mouth at bedtime. (Patient not taking: Reported on 11/24/2023)   vitamin B-12 (CYANOCOBALAMIN) 1000 MCG tablet Take 1,000 mcg by mouth daily.   No facility-administered encounter medications on file as of 01/23/2024.   Vitals:   01/23/24 1308  BP: 120/76  Pulse: 65  Temp: (!) 97.5 F (36.4 C)  Height: 5' 9 (1.753 m)  Weight: 173 lb (78.5 kg)  SpO2: 98%  TempSrc: Oral  BMI (Calculated): 25.54     Physical Exam GEN: No acute distress. CV: Regular rate and rhythm, no murmurs. LUNGS: Clear to auscultation bilaterally, no wheezing, normal respiratory effort. SKIN JOINTS: Warm and dry, no rash.    Data Reviewed: Imaging: CT high-resolution 05/12/2023 Mild subpleural coarsening, reticular densities, stable compared to prior. Indeterminate for UIP.    Chest x-ray 01/15/2024-low lung volumes with streaky atelectasis/scarring I reviewed the images personally.  PFTs: 03/21/2023 FVC 2.24 [55%], FEV1 1.80 [61%], F/F80, TLC 4.43 [64%], DLCO 20.43 [84%] Moderate restriction  FENO 01/03/2024-34  Labs: ILD serologies 03/21/2023-negative IgE 03/21/2023 1806  CBC 01/15/2024-WBC 9.4, eos 5%, absolute eosinophil count 470  Cardiac: Cardiopulmonary exercise test 05/06/2016 Conclusion: Exercise testing with gas exchange demonstrates a normal functional capacity when compared to matched sedentary norms.  There is no indication for exercise-induced bronchospasm. There is no  clear indication for cardiovascular limitation. Although he has normal exercise capacity, he may be primarily symptomatic due to ventilatory limitations. The pattern of no breathing reserve, excessive dead space ventilation (Elevated VE/VCO2 slope) may indicate subtle ILD or pulmonary vascular process. If a right and left heart cath is performed we suggest measuring  right heart pressures before and with exertion to check for exercise induced pulmonary hypertension. Pre-exercise restrictive patterns and exceeding of ventilatory limits at peak exercise may also be related to patient's body habitus, which is contributing to dyspnea.   Echocardiogram 02/16/2023-LVEF 55-60%, grade 1 diastolic dysfunction, normal PA systolic pressure  Cardiac cath 03/13/2018-coronary artery disease with patent stent Normal right heart cath numbers with no evidence of pulmonary hypertension Assessment & Plan Shortness of breath, likely due to suspected asthma Acute episode of shortness of breath occurred at work, leading to an emergency room visit. Initial evaluations, including chest x-ray and EKG, were unremarkable. Suspected asthma due to slightly elevated eosinophil levels, despite no smoking history or COPD.  Mildly elevated FENO levels indicate airway inflammation.  Nebulizer use has been initiated. Differential diagnosis includes asthma, given eosinophil levels, FENO and absence of significant lung issues on imaging. Symbicort  inhaler is considered more appropriate than Stiolto due to suspected asthma. - Prescribe Symbicort  inhaler for twice daily use. - Order nebulizer treatment for as-needed use.  Possible interstitial lung disease Exertional dyspnea with no cough or sputum production. Stable CT scan findings over several years with possible mild interstitial lung disease possibly secondary to past asbestos exposure. Lung function tests show stable oxygenation with decreased lung volumes.  Given mild changes on CT scan which not entirely convincing for ILD we are just monitoring for now.  CTD serologies are negative - High resolution CT for re evaluation  Coronary artery disease, status post stent placement Coronary artery disease with stent placement five years ago. Reports shortness of breath without chest pain during physical activity. Last stress test was in 2021, and  ultrasound was done last year. Shortness of breath is not attributed to cardiac causes based on recent evaluations. - Continue regular follow-ups with cardiologist every six months.   Recommendations: Encourage exercise Discontinue Stiolto and start Symbicort  High-resolution CT Follow-up in 6 months  Lonna Coder MD [facility] 01/23/2024, 1:09 PM  CC: Caleb [name], MD

## 2024-01-23 NOTE — Addendum Note (Signed)
 Addended by: MOODY RANCHER on: 01/23/2024 01:56 PM   Modules accepted: Orders

## 2024-02-01 ENCOUNTER — Encounter (HOSPITAL_COMMUNITY): Payer: Self-pay

## 2024-02-01 ENCOUNTER — Ambulatory Visit (HOSPITAL_COMMUNITY)

## 2024-02-02 ENCOUNTER — Ambulatory Visit
Admission: RE | Admit: 2024-02-02 | Discharge: 2024-02-02 | Disposition: A | Discharge status: Home / Self Care | Source: Ambulatory Visit | Attending: Pulmonary Disease | Admitting: Pulmonary Disease

## 2024-02-02 DIAGNOSIS — R0602 Shortness of breath: Secondary | ICD-10-CM

## 2024-02-02 DIAGNOSIS — J849 Interstitial pulmonary disease, unspecified: Secondary | ICD-10-CM

## 2024-02-06 ENCOUNTER — Ambulatory Visit: Payer: Self-pay | Admitting: Pulmonary Disease

## 2024-03-14 ENCOUNTER — Ambulatory Visit: Attending: Cardiology | Admitting: Cardiology

## 2024-03-14 ENCOUNTER — Encounter: Payer: Self-pay | Admitting: Cardiology

## 2024-03-14 VITALS — BP 114/66 | HR 82 | Ht 69.5 in | Wt 175.8 lb

## 2024-03-14 DIAGNOSIS — I2510 Atherosclerotic heart disease of native coronary artery without angina pectoris: Secondary | ICD-10-CM | POA: Diagnosis not present

## 2024-03-14 DIAGNOSIS — I340 Nonrheumatic mitral (valve) insufficiency: Secondary | ICD-10-CM

## 2024-03-14 DIAGNOSIS — I7121 Aneurysm of the ascending aorta, without rupture: Secondary | ICD-10-CM | POA: Diagnosis not present

## 2024-03-14 DIAGNOSIS — E782 Mixed hyperlipidemia: Secondary | ICD-10-CM | POA: Diagnosis not present

## 2024-03-14 MED ORDER — NITROGLYCERIN 0.4 MG SL SUBL: 0.4000 mg | SUBLINGUAL_TABLET | SUBLINGUAL | 3 refills | Status: AC | PRN

## 2024-03-14 NOTE — Patient Instructions (Addendum)
Medication Instructions:  Nitroglycerin refilled today Continue all other medications.     Labwork: none  Testing/Procedures: none  Follow-Up: 6 months   Any Other Special Instructions Will Be Listed Below (If Applicable).   If you need a refill on your cardiac medications before your next appointment, please call your pharmacy.  

## 2024-03-14 NOTE — Progress Notes (Signed)
 Clinical Summary Caleb Butler is a 78 y.o.male seen today for follow up of the following medical problems.    1.CAD - PCI to ostial LCX in 2018, given stent location indefinitite DAPT was recommended - relook caht 02/2018 with stable findings, does have residual 90% diag disease that recs were for medical management.  - 02/2018 echo LVEF 55-60%, no WMAs, grade I dd, mild MR, mild AI   - 03/2020 nuclear stress: apical scar with mild ischemia - 03/2020 echo LVEF 55-60%, no WMAs, indet diasotlic fxn, mild to mod MR, mild AI, ascending aorta 41mm  - 01/2023 echo: LVEF 55-60%, no WMAs, grade I dd, mild to mod MR, mild AI, ascending aorta 4.4 cm.    - no exertional chest pains. Rare occasional chest discomfort at rest, most recent one lasted just a few minutes and woke him from sleep.  - compliant with meds     2. ILD - followed by pulmonary  - PFTs moderate restriction - 04/2023 suggestive of ILD - some wheezing at times  - complete pulmonary rehab     3. Aortic dilatation - 04/2020 CTA: ascending aorta 3.9 cm - 01/2022 CT high res chest: 4.1 cm ascending aortic aneurysm 04/2023 high rest CT: 4.1 cm ascending aorta 01/2024 CT 4.3 cm   *getting regular high CT chest for his ILD, we have followed results regarding his aneurysm as opposed to ordering separate CTAs   4. Hyperlipidemia 03/2023 TC 103 HDL 40 TG 212 LDL 35 - 07/2023 TC 113 HDL 47 TG 110 LDL 46 - pcp lowered crestor  to 10mg  daily due to muscle aches.      5. Polymyalgia rheumatica - followed by rheumatology   6. Mitral regurgitation - 01/2023 echo: LVEF 55-60%, no WMAs, grade I dd, mild to mod MR, mild AI, ascending aorta 4.4 cm.  Past Medical History:  Diagnosis Date   Anxiety    Aortic aneurysm    dilated aortic root on echo 2024   Arthritis    my back is loaded w/it (01/06/2017)   CAD (coronary artery disease)    80% ostial lesion in L circumflex (2017)   Chronic lower back pain    GERD  (gastroesophageal reflux disease)    Hiatal hernia    High cholesterol    Hypothyroidism    IBS (irritable bowel syndrome)    Numbness and tingling of right lower extremity    since 11/30/2016 (01/06/2017)   PMR (polymyalgia rheumatica)    Stroke (HCC)    Type 2 diabetes mellitus (HCC)      Allergies  Allergen Reactions   Bee Venom Swelling   Ceclor [Cefaclor] Hives   Ciprofloxacin Hives   Jardiance  [Empagliflozin ]    Metformin  And Related Diarrhea    upsesomach     Current Outpatient Medications  Medication Sig Dispense Refill   Accu-Chek FastClix Lancets MISC USE TO CHECK BLOOD SUGAR TWICE DAILY E11.9 102 each 12   acetaminophen  (TYLENOL ) 500 MG tablet Take 500-1,000 mg by mouth daily as needed for moderate pain or headache.     albuterol  (PROVENTIL ) (5 MG/ML) 0.5% nebulizer solution Take 0.5 mLs (2.5 mg total) by nebulization every 6 (six) hours as needed for wheezing or shortness of breath. 20 mL 12   aspirin  81 MG EC tablet Take 81 mg by mouth at bedtime.      blood glucose meter kit and supplies Dispense based on patient and insurance preference. Use up to four times daily as directed. (  FOR ICD-10 E10.9, E11.9). 1 each 0   Blood Glucose Monitoring Suppl DEVI 1 each by Does not apply route in the morning, at noon, and at bedtime. May substitute to any manufacturer covered by patient's insurance. Needs Onetouch by [employer] 1 each 0   budesonide -formoterol  (SYMBICORT ) 160-4.5 MCG/ACT inhaler Inhale 2 puffs into the lungs in the morning and at bedtime. 1 each 11   Capsaicin 0.1 % CREA Apply topically as needed.     clopidogrel  (PLAVIX ) 75 MG tablet TAKE 1 TABLET BY MOUTH EVERY DAY 90 tablet 0   famotidine (PEPCID) 40 MG tablet Take 40 mg by mouth daily.     levothyroxine  (SYNTHROID ) 100 MCG tablet Take 50 mcg by mouth daily before breakfast.     levothyroxine  (SYNTHROID ) 75 MCG tablet Take 1 tablet (75 mcg total) by mouth daily. 90 tablet 3   mometasone  (ELOCON ) 0.1 % ointment  Apply topically daily. 45 g 0   Multiple Vitamin (MULTIVITAMIN WITH MINERALS) TABS tablet Take 1 tablet by mouth daily.     predniSONE  (DELTASONE ) 20 MG tablet Take 2 tablets (40 mg total) by mouth daily with breakfast. For the next four days (Patient not taking: Reported on 01/23/2024) 8 tablet 0   rosuvastatin  (CRESTOR ) 20 MG tablet TAKE 1 TABLET BY MOUTH EVERY DAY 90 tablet 3   Semaglutide ,0.25 or 0.5MG /DOS, (OZEMPIC , 0.25 OR 0.5 MG/DOSE,) 2 MG/1.5ML SOPN Inject 0.5 mg into the skin once a week. 1.5 mL 3   traZODone  (DESYREL ) 50 MG tablet Take 1 tablet (50 mg total) by mouth at bedtime. 90 tablet 3   vitamin B-12 (CYANOCOBALAMIN) 1000 MCG tablet Take 1,000 mcg by mouth daily.     Current Facility-Administered Medications  Medication Dose Route Frequency Provider Last Rate Last Admin   ipratropium-albuterol  (DUONEB) 0.5-2.5 (3) MG/3ML nebulizer solution 3 mL  3 mL Nebulization Q4H Mannam, Praveen, MD         Past Surgical History:  Procedure Laterality Date   CARDIAC CATHETERIZATION  06/2016   CORONARY ANGIOPLASTY WITH STENT PLACEMENT  01/06/2017   CORONARY STENT INTERVENTION N/A 01/06/2017   Procedure: CORONARY STENT INTERVENTION;  Surgeon: Wonda Sharper, MD;  Location: [facility] INVASIVE CV LAB;  Service: Cardiovascular;  Laterality: N/A;   FINGER SURGERY Left 1984   ring finger reattached.    FOREARM FRACTURE SURGERY Right 2004   has 3 rods and 22 screws in it   INGUINAL HERNIA REPAIR Left    RIGHT/LEFT HEART CATH AND CORONARY ANGIOGRAPHY N/A 07/07/2016   Procedure: Right/Left Heart Cath and Coronary Angiography;  Surgeon: Toribio JONELLE Fuel, MD;  Location: [facility] INVASIVE CV LAB;  Service: Cardiovascular;  Laterality: N/A;   RIGHT/LEFT HEART CATH AND CORONARY ANGIOGRAPHY N/A 03/13/2018   Procedure: RIGHT/LEFT HEART CATH AND CORONARY ANGIOGRAPHY;  Surgeon: Fuel Toribio JONELLE, MD;  Location: [facility] INVASIVE CV LAB;  Service: Cardiovascular;  Laterality: N/A;     Allergies  Allergen Reactions    Bee Venom Swelling   Ceclor [Cefaclor] Hives   Ciprofloxacin Hives   Jardiance  [Empagliflozin ]    Metformin  And Related Diarrhea    upsesomach      Family History  Problem Relation Age of Onset   Diabetes Mellitus II Father    Heart failure Father    Kidney failure Father    Heart attack Mother      Social History Caleb Butler reports that he has never smoked. He has never used smokeless tobacco. Caleb Butler reports that he does not currently use alcohol after a  past usage of about 2.0 standard drinks of alcohol per week.    Physical Examination Today's Vitals   03/14/24 1143  BP: 114/66  Pulse: 82  SpO2: 98%  Weight: 175 lb 12.8 oz (79.7 kg)  Height: 5' 9.5 (1.765 m)   Body mass index is 25.59 kg/m.  Gen: resting comfortably, no acute distress HEENT: no scleral icterus, pupils equal round and reactive, no palptable cervical adenopathy,  CV: RRR, no m/rg, no jvd Resp: Clear to auscultation bilaterally GI: abdomen is soft, non-tender, non-distended, normal bowel sounds, no hepatosplenomegaly MSK: extremities are warm, no edema.  Skin: warm, no rash Neuro:  no focal deficits Psych: appropriate affect   Diagnostic Studies  06/2016 cath The left ventricular ejection fraction is 55-65% by visual estimate. Prox RCA lesion, 40 %stenosed. Ost Cx lesion, 90 %stenosed. Ramus lesion, 90 %stenosed. LM lesion, 20 %stenosed. Mid LAD lesion, 20 %stenosed. 1st Diag lesion, 80 %stenosed.   Findings:   RA = 5 RV = 23/8 PA = 27/4 (16) PCW = 3 Fick cardiac output/index = 5.2/2.6 PVR = 2.5 WU  Ao sat =95% PA sat = 68%, 69%   Assessment: 1. 1-V CAD at ostium of LCX alsi involving ostium of ramus 2. Aneursymal proximal LAD with mild plaque 3. 40% Proximal RCA 4. Normal LVEF with normal hemodynamics   Plan/Discussion:   Case reviewed with Dr. Wonda. He has high grade ostial lesion in ostium of LCX also involving Ramus Camila Maita. It is a moderate-sized system  which is not completely favorable to PCI. Will proceed with ETT/Myvoview. If high ischemic burden will plan angioplasty +/- stenting. Otherwise proceed with aggressive medical therapy.    12/2016 PCI Successful PCI of severe ostial stenosis in the left circumflex, reducing the stenosis from 90% to 0% with Cutting Balloon angioplasty followed by drug-eluting stent implantation.   Recommendations: Aspirin  and Plavix  for a minimum of 12 months. If tolerated favor long-term DAPT as the stent is positioned at the left main bifurcation.     02/2018 RHC/LHC LM lesion is 20% stenosed. Ramus lesion is 90% stenosed. Prox RCA lesion is 40% stenosed. Ost 1st Diag to 1st Diag lesion is 90% stenosed. Prox LAD lesion is 30% stenosed. Previously placed Ost Cx to Prox Cx stent (unknown type) is widely patent. Ost LM to Mid LM lesion is 20% stenosed.     Findings:   RA = 7 RV = 32/9 PA = 33/10 (20) PCW = 10 Fick cardiac output/index = 5.4/2.7 PVR = 1.9 WU Ao sat = 93% PA sat = 64%, 68%   Assessment: 1. CAD with widely patent stent in ostial LCX 2. Unchanged 90% lesion in ostium of moderate-sized first diagonal 3. Otherwise mild non-obstructive CAD 4. EF 55-60% 6. Normal RHC numbers     02/2018 echo Study Conclusions   - Left ventricle: The cavity size was normal. Wall thickness was    increased in a pattern of mild LVH. Systolic function was normal.    The estimated ejection fraction was in the range of 55% to 60%.    Wall motion was normal; there were no regional wall motion    abnormalities. Doppler parameters are consistent with abnormal    left ventricular relaxation (grade 1 diastolic dysfunction).  - Aortic valve: There was mild regurgitation.  - Mitral valve: There was mild regurgitation.    03/2020 nuclear stress No diagnostic ST segment changes to indicate ischemia. Small, mild intensity, apical to basal inferolateral defect that is partially reversible mainly  at the apex.  This is consistent with scar and mild peri-infarct ischemia. This is a low risk study. Nuclear stress EF: 62%.   03/2020 echo IMPRESSIONS     1. Left ventricular ejection fraction, by estimation, is 55 to 60%. The  left ventricle has normal function. The left ventricle has no regional  wall motion abnormalities. Left ventricular diastolic parameters are  indeterminate.   2. Right ventricular systolic function is normal. The right ventricular  size is normal. There is normal pulmonary artery systolic pressure. The  estimated right ventricular systolic pressure is 19.3 mmHg.   3. The mitral valve is grossly normal. Mild to moderate mitral valve  regurgitation.   4. The aortic valve is tricuspid. Aortic valve regurgitation is mild.  Aortic regurgitation PHT measures 528 msec.   5. Aortic dilatation noted. There is mild dilatation of the ascending  aorta, measuring 41 mm. There is mild dilatation of the aortic root,  measuring 40 mm.   6. The inferior vena cava is normal in size with greater than 50%  respiratory variability, suggesting right atrial pressure of 3 mmHg.      Assessment and Plan   CAD - no significant symptoms, continue current meds   2. Hyperlipidemia -LDL is at goal, continue current statin.      3.Aortic aneurysm - he is getting annual CT's with pulmonary, we have not had to order separate CTA's - mild aneurysm by recent CT though slightly larger, continue to monitor     4.Mitral regurgitation - mild to moderate MR by recent echo, we will repeat echo last next year       Dorn PHEBE Ross, M.D.

## 2024-03-21 ENCOUNTER — Ambulatory Visit (INDEPENDENT_AMBULATORY_CARE_PROVIDER_SITE_OTHER): Admitting: *Deleted

## 2024-03-21 VITALS — Ht 69.5 in | Wt 175.0 lb

## 2024-03-21 DIAGNOSIS — Z0000 Encounter for general adult medical examination without abnormal findings: Secondary | ICD-10-CM | POA: Diagnosis not present

## 2024-03-21 NOTE — Progress Notes (Signed)
 Subjective:   Caleb Butler is a 78 y.o. male who presents for Medicare Annual/Subsequent preventive examination.  Visit Complete: In person   Cardiac Risk Factors include: advanced age (>5men, >22 women);male gender;obesity (BMI >30kg/m2)     Objective:    Today's Vitals   03/21/24 0852  Weight: 175 lb (79.4 kg)  Height: 5' 9.5 (1.765 m)   Body mass index is 25.47 kg/m.     03/21/2024    8:59 AM 01/15/2024   10:10 AM 07/04/2023    8:27 AM 09/18/2022    8:56 PM 05/20/2021    2:08 PM 03/13/2018   10:20 AM 03/09/2017    2:11 PM  Advanced Directives  Does Patient Have a Medical Advance Directive? Yes No Yes No No No  No   Type of Advance Directive Healthcare Power of Attorney        Does patient want to make changes to medical advance directive?   No - Patient declined      Would patient like information on creating a medical advance directive?     No - Patient declined No - Patient declined  No - Patient declined      Data saved with a previous flowsheet row definition    Current Medications (verified) Outpatient Encounter Medications as of 03/21/2024  Medication Sig   Accu-Chek FastClix Lancets MISC USE TO CHECK BLOOD SUGAR TWICE DAILY E11.9   acetaminophen  (TYLENOL ) 500 MG tablet Take 500-1,000 mg by mouth daily as needed for moderate pain or headache.   albuterol  (PROVENTIL ) (5 MG/ML) 0.5% nebulizer solution Take 0.5 mLs (2.5 mg total) by nebulization every 6 (six) hours as needed for wheezing or shortness of breath.   aspirin  81 MG EC tablet Take 81 mg by mouth at bedtime.    blood glucose meter kit and supplies Dispense based on patient and insurance preference. Use up to four times daily as directed. (FOR ICD-10 E10.9, E11.9).   Blood Glucose Monitoring Suppl DEVI 1 each by Does not apply route in the morning, at noon, and at bedtime. May substitute to any manufacturer covered by patient's insurance. Needs Onetouch by [employer]   budesonide -formoterol   (SYMBICORT ) 160-4.5 MCG/ACT inhaler Inhale 2 puffs into the lungs in the morning and at bedtime.   Capsaicin 0.1 % CREA Apply topically as needed.   diclofenac Sodium (VOLTAREN) 1 % GEL Apply 2 g topically daily as needed.   famotidine (PEPCID) 40 MG tablet Take 40 mg by mouth daily.   levothyroxine  (SYNTHROID ) 75 MCG tablet Take 1 tablet (75 mcg total) by mouth daily.   mometasone  (ELOCON ) 0.1 % ointment Apply topically daily.   Multiple Vitamin (MULTIVITAMIN WITH MINERALS) TABS tablet Take 1 tablet by mouth daily.   nitroGLYCERIN  (NITROSTAT ) 0.4 MG SL tablet Place 1 tablet (0.4 mg total) under the tongue every 5 (five) minutes as needed for chest pain (may use 3 in a 15 minute time frame, if no relief after 3rd dose proceed to ED for evaluation).   rosuvastatin  (CRESTOR ) 20 MG tablet TAKE 1 TABLET BY MOUTH EVERY DAY   Semaglutide ,0.25 or 0.5MG /DOS, (OZEMPIC , 0.25 OR 0.5 MG/DOSE,) 2 MG/1.5ML SOPN Inject 0.5 mg into the skin once a week.   vitamin B-12 (CYANOCOBALAMIN) 1000 MCG tablet Take 1,000 mcg by mouth daily.   clopidogrel  (PLAVIX ) 75 MG tablet TAKE 1 TABLET BY MOUTH EVERY DAY (Patient not taking: Reported on 03/21/2024)   DENTA 5000 PLUS 1.1 % CREA dental cream Place 1 Application onto teeth at bedtime. (  Patient not taking: Reported on 03/21/2024)   levothyroxine  (SYNTHROID ) 100 MCG tablet Take 50 mcg by mouth daily before breakfast. (Patient not taking: Reported on 03/21/2024)   traZODone  (DESYREL ) 50 MG tablet Take 1 tablet (50 mg total) by mouth at bedtime. (Patient not taking: Reported on 03/21/2024)   Facility-Administered Encounter Medications as of 03/21/2024  Medication   ipratropium-albuterol  (DUONEB) 0.5-2.5 (3) MG/3ML nebulizer solution 3 mL    Allergies (verified) Bee venom, Ceclor [cefaclor], Ciprofloxacin, Jardiance  [empagliflozin ], and Metformin  and related   History: Past Medical History:  Diagnosis Date   Anxiety    Aortic aneurysm    dilated aortic root on echo  2024   Arthritis    my back is loaded w/it (01/06/2017)   CAD (coronary artery disease)    80% ostial lesion in L circumflex (2017)   Chronic lower back pain    GERD (gastroesophageal reflux disease)    Hiatal hernia    High cholesterol    Hypothyroidism    IBS (irritable bowel syndrome)    Numbness and tingling of right lower extremity    since 11/30/2016 (01/06/2017)   PMR (polymyalgia rheumatica)    Stroke (HCC)    Type 2 diabetes mellitus (HCC)    Past Surgical History:  Procedure Laterality Date   CARDIAC CATHETERIZATION  06/2016   CORONARY ANGIOPLASTY WITH STENT PLACEMENT  01/06/2017   CORONARY STENT INTERVENTION N/A 01/06/2017   Procedure: CORONARY STENT INTERVENTION;  Surgeon: Wonda Sharper, MD;  Location: [facility] INVASIVE CV LAB;  Service: Cardiovascular;  Laterality: N/A;   FINGER SURGERY Left 1984   ring finger reattached.    FOREARM FRACTURE SURGERY Right 2004   has 3 rods and 22 screws in it   INGUINAL HERNIA REPAIR Left    RIGHT/LEFT HEART CATH AND CORONARY ANGIOGRAPHY N/A 07/07/2016   Procedure: Right/Left Heart Cath and Coronary Angiography;  Surgeon: Toribio JONELLE Fuel, MD;  Location: [facility] INVASIVE CV LAB;  Service: Cardiovascular;  Laterality: N/A;   RIGHT/LEFT HEART CATH AND CORONARY ANGIOGRAPHY N/A 03/13/2018   Procedure: RIGHT/LEFT HEART CATH AND CORONARY ANGIOGRAPHY;  Surgeon: Fuel Toribio JONELLE, MD;  Location: [facility] INVASIVE CV LAB;  Service: Cardiovascular;  Laterality: N/A;   Family History  Problem Relation Age of Onset   Diabetes Mellitus II Father    Heart failure Father    Kidney failure Father    Heart attack Mother    Social History   Socioeconomic History   Marital status: Married    Spouse name: Barnie   Number of children: 0   Years of education: 12   Highest education level: Not on file  Occupational History    Comment: retired  Tobacco Use   Smoking status: Never   Smokeless tobacco: Never  Vaping Use   Vaping status: Never Used   Substance and Sexual Activity   Alcohol use: Not Currently    Alcohol/week: 2.0 standard drinks of alcohol    Types: 1 Glasses of wine, 1 Cans of beer per week    Comment: 1 beer/week   Drug use: No   Sexual activity: Not Currently  Other Topics Concern   Not on file  Social History Narrative   Retired. Lives with wife, Barnie.    Caffeine- coffee, 1 daily   Social Drivers of Corporate investment banker Strain: Low Risk  (03/21/2024)   Overall Financial Resource Strain (CARDIA)    Difficulty of Paying Living Expenses: Not hard at all  Food Insecurity: No Food Insecurity (03/21/2024)  Hunger Vital Sign    Worried About Running Out of Food in the Last Year: Never true    Ran Out of Food in the Last Year: Never true  Transportation Needs: No Transportation Needs (03/21/2024)   PRAPARE - Administrator, Civil Service (Medical): No    Lack of Transportation (Non-Medical): No  Physical Activity: Sufficiently Active (03/21/2024)   Exercise Vital Sign    Days of Exercise per Week: 5 days    Minutes of Exercise per Session: 50 min  Stress: No Stress Concern Present (03/21/2024)   [employer] of Occupational Health - Occupational Stress Questionnaire    Feeling of Stress: Not at all  Social Connections: Moderately Integrated (03/21/2024)   Social Connection and Isolation Panel    Frequency of Communication with Friends and Family: More than three times a week    Frequency of Social Gatherings with Friends and Family: More than three times a week    Attends Religious Services: Never    Database administrator or Organizations: Yes    Attends Engineer, structural: More than 4 times per year    Marital Status: Married    Tobacco Counseling Counseling given: Not Answered   Clinical Intake:  Pre-visit preparation completed: Yes  Pain : No/denies pain     Diabetes: Yes CBG done?: No Did pt. bring in CBG monitor from home?: No  How often do you  need to have someone help you when you read instructions, pamphlets, or other written materials from your doctor or pharmacy?: 1 - Never  Interpreter Needed?: No  Information entered by :: Mliss Graff LPN   Activities of Daily Living    03/21/2024    8:58 AM  In your present state of health, do you have any difficulty performing the following activities:  Hearing? 1  Vision? 0  Difficulty concentrating or making decisions? 0  Walking or climbing stairs? 0  Dressing or bathing? 0  Doing errands, shopping? 0  Preparing Food and eating ? N  Using the Toilet? N  In the past six months, have you accidently leaked urine? N  Do you have problems with loss of bowel control? N  Managing your Medications? N  Managing your Finances? N  Housekeeping or managing your Housekeeping? N    Patient Care Team: [name], MD as PCP - General (Family Medicine) Alvan Dorn FALCON, MD as PCP - Cardiology (Cardiology) Clinic, [facility]  Indicate any recent Medical Services you may have received from other than [facility] providers in the past year (date may be approximate).     Assessment:   This is a routine wellness examination for [place].  Hearing/Vision screen Hearing Screening - Comments:: Getting new hearing aids in [place] bilateral Vision Screening - Comments:: [facility]  [facility] unsure of name Up to date   Goals Addressed             This Visit's Progress    Increase physical activity         Depression Screen    03/21/2024    8:55 AM 11/24/2023    9:19 AM 10/03/2023   11:11 AM 08/03/2023   11:05 AM 07/04/2023    2:14 PM 05/20/2022    2:19 PM 03/25/2022    2:45 PM  PHQ 2/9 Scores  PHQ - 2 Score 1 4 1 2 1  0 0  PHQ- 9 Score 9 12 7 6 7       Fall Risk  03/21/2024    9:11 AM 11/24/2023    9:19 AM 07/04/2023    8:24 AM 05/20/2022    2:19 PM 03/25/2022    2:45 PM  Fall Risk   Falls in the past year? 0 1 1 0 1  Number falls in past yr: 0 0 0 0 1  Injury with  Fall? 0 1 0 0 1  Risk for fall due to :  Impaired balance/gait No Fall Risks No Fall Risks History of fall(s);Impaired balance/gait;Orthopedic patient  Follow up Falls evaluation completed;Education provided;Falls prevention discussed Falls evaluation completed Falls prevention discussed Falls prevention discussed  Education provided;Falls prevention discussed      Data saved with a previous flowsheet row definition    MEDICARE RISK AT HOME: Medicare Risk at Home Any stairs in or around the home?: Yes If so, are there any without handrails?: No Home free of loose throw rugs in walkways, pet beds, electrical cords, etc?: Yes Adequate lighting in your home to reduce risk of falls?: Yes Life alert?: No Use of a cane, walker or w/c?: No Grab bars in the bathroom?: No Shower chair or bench in shower?: No Elevated toilet seat or a handicapped toilet?: Yes  TIMED UP AND GO:  Was the test performed?  Yes  Length of time to ambulate 10 feet: 10 sec Gait steady and fast without assisted device   Cognitive Function:        03/21/2024    8:51 AM 05/20/2021    2:12 PM  6CIT Screen  What Year? 0 points 0 points  What month? 0 points 0 points  What time? 0 points 0 points  Count back from 20 0 points 0 points  Months in reverse 0 points 0 points  Repeat phrase 0 points 0 points  Total Score 0 points 0 points    Immunizations Immunization History  Administered Date(s) Administered    sv, Bivalent, Protein Subunit Rsvpref,pf (Abrysvo) 12/30/2022   Fluad Quad(high Dose 65+) 02/20/2020, 03/17/2021   Fluad Trivalent(High Dose 65+) 02/16/2018   INFLUENZA, HIGH DOSE SEASONAL PF 01/29/2019, 03/10/2023   Influenza, Quadrivalent, Recombinant, Inj, Pf 03/13/2017, 01/21/2019   Influenza,inj,Quad PF,6+ Mos 02/25/2016   Influenza-Unspecified 02/16/2018, 02/28/2019, 02/12/2020, 02/27/2021, 02/27/2022   Moderna Covid-19 Fall Seasonal Vaccine 28yrs & older 03/10/2023   Moderna Covid-19 Vaccine  Bivalent Booster 21yrs & up 04/05/2021   Moderna Sars-Covid-2 Vaccination 07/05/2019, 07/30/2019, 08/21/2019, 04/16/2020, 10/27/2020   Pfizer(Comirnaty)Fall Seasonal Vaccine 12 years and older 06/08/2022   Pneumococcal Conjugate-13 04/19/2019   Pneumococcal Polysaccharide-23 08/01/2019   Tdap 11/30/2015   Zoster Recombinant(Shingrix) 10/05/2021, 01/14/2022    TDAP status: Up to date  Flu Vaccine status: Up to date  Pneumococcal vaccine status: Up to date  Covid-19 vaccine status: Information provided on how to obtain vaccines.   Qualifies for Shingles Vaccine? No   Zostavax completed Yes   Shingrix Completed?: Yes  Screening Tests Health Maintenance  Topic Date Due   COVID-19 Vaccine (9 - 2025-26 season) 01/29/2024   HEMOGLOBIN A1C  01/31/2024   Diabetic kidney evaluation - Urine ACR  04/03/2024   FOOT EXAM  04/03/2024   OPHTHALMOLOGY EXAM  11/12/2024   Diabetic kidney evaluation - eGFR measurement  01/14/2025   Medicare Annual Wellness (AWV)  03/21/2025   DTaP/Tdap/Td (2 - Td or Tdap) 11/29/2025   Pneumococcal Vaccine: 50+ Years  Completed   Influenza Vaccine  Completed   Hepatitis C Screening  Completed   Zoster Vaccines- Shingrix  Completed   Meningococcal B Vaccine  Aged Out   Colonoscopy  Discontinued    Health Maintenance  Health Maintenance Due  Topic Date Due   COVID-19 Vaccine (9 - 2025-26 season) 01/29/2024   HEMOGLOBIN A1C  01/31/2024   Diabetic kidney evaluation - Urine ACR  04/03/2024    Colorectal cancer screening: No longer required.   Lung Cancer Screening: (Low Dose CT Chest recommended if Age 61-80 years, 20 pack-year currently smoking OR have quit w/in 15years.) does not qualify.   Lung Cancer Screening Referral:   Additional Screening:  Hepatitis C Screening: does not qualify; Completed   Vision Screening: Recommended annual ophthalmology exams for early detection of glaucoma and other disorders of the eye. Is the patient up to date with  their annual eye exam?  Yes  Who is the provider or what is the name of the office in which the patient attends annual [facility] exams? [facility] If pt is not established with a provider, would they like to be referred to a provider to establish care? No .   Dental Screening: Recommended annual dental exams for proper oral hygiene  Nutrition Risk Assessment:  Has the patient had any N/V/D within the last 2 months?  No  Does the patient have any non-healing wounds?  No  Has the patient had any unintentional weight loss or weight gain?  No   Diabetes:  Is the patient diabetic?  Yes  If diabetic, was a CBG obtained today?  No  Did the patient bring in their glucometer from home?  No  How often do you monitor your CBG's? 2x a week.   Financial Strains and Diabetes Management:  Are you having any financial strains with the device, your supplies or your medication? No .  Does the patient want to be seen by Chronic Care Management for management of their diabetes?  No  Would the patient like to be referred to a Nutritionist or for Diabetic Management?  No   Diabetic Exams:  Diabetic Eye Exam: Completed .Pt has been advised about the importance in completing this exam.   Diabetic Foot Exam: . Pt has been advised about the importance in completing this exam. .    Community Resource Referral / Chronic Care Management: CRR required this visit?  No   CCM required this visit?  No     Plan:     I have personally reviewed and noted the following in the patient's chart:   Medical and social history Use of alcohol, tobacco or illicit drugs  Current medications and supplements including opioid prescriptions. Patient is not currently taking opioid prescriptions. Functional ability and status Nutritional status Physical activity Advanced directives List of other physicians Hospitalizations, surgeries, and ER visits in previous 12 months Vitals Screenings to include cognitive, depression, and  falls Referrals and appointments  In addition, I have reviewed and discussed with patient certain preventive protocols, quality metrics, and best practice recommendations. A written personalized care plan for preventive services as well as general preventive health recommendations were provided to patient.     Mliss Graff, LPN   [id number]   After Visit Summary: (MyChart) Due to this being a telephonic visit, the after visit summary with patients personalized plan was offered to patient via MyChart   Nurse Notes:

## 2024-03-21 NOTE — Patient Instructions (Signed)
 Mr. Caleb Butler , Thank you for taking time to come for your Medicare Wellness Visit. I appreciate your ongoing commitment to your health goals. Please review the following plan we discussed and let me know if I can assist you in the future.   Screening recommendations/referrals: Colonoscopy:  Recommended yearly ophthalmology/optometry visit for glaucoma screening and checkup Recommended yearly dental visit for hygiene and checkup  Vaccinations: Influenza vaccine:  Pneumococcal vaccine:  Tdap vaccine:  Shingles vaccine:       Preventive Care 65 Years and Older, Male Preventive care refers to lifestyle choices and visits with your health care provider that can promote health and wellness. What does preventive care include? A yearly physical exam. This is also called an annual well check. Dental exams once or twice a year. Routine eye exams. Ask your health care provider how often you should have your eyes checked. Personal lifestyle choices, including: Daily care of your teeth and gums. Regular physical activity. Eating a healthy diet. Avoiding tobacco and drug use. Limiting alcohol use. Practicing safe sex. Taking low doses of aspirin  every day. Taking vitamin and mineral supplements as recommended by your health care provider. What happens during an annual well check? The services and screenings done by your health care provider during your annual well check will depend on your age, overall health, lifestyle risk factors, and family history of disease. Counseling  Your health care provider may ask you questions about your: Alcohol use. Tobacco use. Drug use. Emotional well-being. Home and relationship well-being. Sexual activity. Eating habits. History of falls. Memory and ability to understand (cognition). Work and work Astronomer. Screening  You may have the following tests or measurements: Height, weight, and BMI. Blood pressure. Lipid and cholesterol levels. These  may be checked every 5 years, or more frequently if you are over 50 years old. Skin check. Lung cancer screening. You may have this screening every year starting at age 57 if you have a 30-pack-year history of smoking and currently smoke or have quit within the past 15 years. Fecal occult blood test (FOBT) of the stool. You may have this test every year starting at age 51. Flexible sigmoidoscopy or colonoscopy. You may have a sigmoidoscopy every 5 years or a colonoscopy every 10 years starting at age 67. Prostate cancer screening. Recommendations will vary depending on your family history and other risks. Hepatitis C blood test. Hepatitis B blood test. Sexually transmitted disease (STD) testing. Diabetes screening. This is done by checking your blood sugar (glucose) after you have not eaten for a while (fasting). You may have this done every 1-3 years. Abdominal aortic aneurysm (AAA) screening. You may need this if you are a current or former smoker. Osteoporosis. You may be screened starting at age 63 if you are at high risk. Talk with your health care provider about your test results, treatment options, and if necessary, the need for more tests. Vaccines  Your health care provider may recommend certain vaccines, such as: Influenza vaccine. This is recommended every year. Tetanus, diphtheria, and acellular pertussis (Tdap, Td) vaccine. You may need a Td booster every 10 years. Zoster vaccine. You may need this after age 34. Pneumococcal 13-valent conjugate (PCV13) vaccine. One dose is recommended after age 7. Pneumococcal polysaccharide (PPSV23) vaccine. One dose is recommended after age 1. Talk to your health care provider about which screenings and vaccines you need and how often you need them. This information is not intended to replace advice given to you by your health care  provider. Make sure you discuss any questions you have with your health care provider. Document Released:  06/12/2015 Document Revised: 02/03/2016 Document Reviewed: 03/17/2015 Elsevier Interactive Patient Education  2017 [employer].  Fall Prevention in the Home Falls can cause injuries. They can happen to people of all ages. There are many things you can do to make your home safe and to help prevent falls. What can I do on the outside of my home? Regularly fix the edges of walkways and driveways and fix any cracks. Remove anything that might make you trip as you walk through a door, such as a raised step or threshold. Trim any bushes or trees on the path to your home. Use bright outdoor lighting. Clear any walking paths of anything that might make someone trip, such as rocks or tools. Regularly check to see if handrails are loose or broken. Make sure that both sides of any steps have handrails. Any raised decks and porches should have guardrails on the edges. Have any leaves, snow, or ice cleared regularly. Use sand or salt on walking paths during winter. Clean up any spills in your garage right away. This includes oil or grease spills. What can I do in the bathroom? Use night lights. Install grab bars by the toilet and in the tub and shower. Do not use towel bars as grab bars. Use non-skid mats or decals in the tub or shower. If you need to sit down in the shower, use a plastic, non-slip stool. Keep the floor dry. Clean up any water that spills on the floor as soon as it happens. Remove soap buildup in the tub or shower regularly. Attach bath mats securely with double-sided non-slip rug tape. Do not have throw rugs and other things on the floor that can make you trip. What can I do in the bedroom? Use night lights. Make sure that you have a light by your bed that is easy to reach. Do not use any sheets or blankets that are too big for your bed. They should not hang down onto the floor. Have a firm chair that has side arms. You can use this for support while you get dressed. Do not have  throw rugs and other things on the floor that can make you trip. What can I do in the kitchen? Clean up any spills right away. Avoid walking on wet floors. Keep items that you use a lot in easy-to-reach places. If you need to reach something above you, use a strong step stool that has a grab bar. Keep electrical cords out of the way. Do not use floor polish or wax that makes floors slippery. If you must use wax, use non-skid floor wax. Do not have throw rugs and other things on the floor that can make you trip. What can I do with my stairs? Do not leave any items on the stairs. Make sure that there are handrails on both sides of the stairs and use them. Fix handrails that are broken or loose. Make sure that handrails are as long as the stairways. Check any carpeting to make sure that it is firmly attached to the stairs. Fix any carpet that is loose or worn. Avoid having throw rugs at the top or bottom of the stairs. If you do have throw rugs, attach them to the floor with carpet tape. Make sure that you have a light switch at the top of the stairs and the bottom of the stairs. If you do not have  them, ask someone to add them for you. What else can I do to help prevent falls? Wear shoes that: Do not have high heels. Have rubber bottoms. Are comfortable and fit you well. Are closed at the toe. Do not wear sandals. If you use a stepladder: Make sure that it is fully opened. Do not climb a closed stepladder. Make sure that both sides of the stepladder are locked into place. Ask someone to hold it for you, if possible. Clearly mark and make sure that you can see: Any grab bars or handrails. First and last steps. Where the edge of each step is. Use tools that help you move around (mobility aids) if they are needed. These include: Canes. Walkers. Scooters. Crutches. Turn on the lights when you go into a dark area. Replace any light bulbs as soon as they burn out. Set up your furniture so  you have a clear path. Avoid moving your furniture around. If any of your floors are uneven, fix them. If there are any pets around you, be aware of where they are. Review your medicines with your doctor. Some medicines can make you feel dizzy. This can increase your chance of falling. Ask your doctor what other things that you can do to help prevent falls. This information is not intended to replace advice given to you by your health care provider. Make sure you discuss any questions you have with your health care provider. Document Released: 03/12/2009 Document Revised: 10/22/2015 Document Reviewed: 06/20/2014 Elsevier Interactive Patient Education  2017 [employer].

## 2024-03-27 ENCOUNTER — Ambulatory Visit

## 2024-03-27 ENCOUNTER — Other Ambulatory Visit: Payer: Self-pay | Admitting: Family Medicine

## 2024-03-27 DIAGNOSIS — I25119 Atherosclerotic heart disease of native coronary artery with unspecified angina pectoris: Secondary | ICD-10-CM

## 2024-03-28 NOTE — Telephone Encounter (Signed)
 Requested medication (s) are due for refill today - yes  Requested medication (s) are on the active medication list -yes  Future visit scheduled -no  Last refill: 01/01/24 #90  Notes to clinic: no protocol showing for this medication  Requested Prescriptions  Pending Prescriptions Disp Refills   clopidogrel  (PLAVIX ) 75 MG tablet [Pharmacy Med Name: CLOPIDOGREL  75 MG TABLET] 90 tablet 0    Sig: TAKE 1 TABLET BY MOUTH EVERY DAY     There is no refill protocol information for this order       Requested Prescriptions  Pending Prescriptions Disp Refills   clopidogrel  (PLAVIX ) 75 MG tablet [Pharmacy Med Name: CLOPIDOGREL  75 MG TABLET] 90 tablet 0    Sig: TAKE 1 TABLET BY MOUTH EVERY DAY     There is no refill protocol information for this order

## 2024-04-07 ENCOUNTER — Other Ambulatory Visit: Payer: Self-pay | Admitting: Family Medicine

## 2024-04-09 NOTE — Telephone Encounter (Signed)
 Requested medications are due for refill today.  yes  Requested medications are on the active medications list.  yes  Last refill. 04/14/2023 #90 3 rf  Future visit scheduled.   no  Notes to clinic.  Labs are expired    Requested Prescriptions  Pending Prescriptions Disp Refills   levothyroxine  (SYNTHROID ) 75 MCG tablet [Pharmacy Med Name: LEVOTHYROXINE  75 MCG TABLET] 90 tablet 3    Sig: TAKE 1 TABLET BY MOUTH EVERY DAY     Endocrinology:  Hypothyroid Agents Failed - 04/09/2024 10:16 AM      Failed - TSH in normal range and within 360 days    TSH  Date Value Ref Range Status  04/04/2023 2.24 0.40 - 4.50 mIU/L Final         Passed - Valid encounter within last 12 months    Recent Outpatient Visits           4 months ago Polyarthralgia   [place] [facility] Pickard, [name], MD   8 months ago Blurry vision, bilateral   [place] [facility] Aletha Bene, MD   1 year ago Controlled type 2 diabetes mellitus with complication, without long-term current use of insulin  [facility])   [place] [facility] [name], MD   1 year ago Shortness of breath   [place] [facility] Pickard, [name], MD   1 year ago Diverticulitis    [facility] Pickard, [name], MD

## 2024-04-12 NOTE — Telephone Encounter (Signed)
 Copied from CRM [id number]. Topic: Clinical - Prescription Issue >> Apr 12, 2024  8:22 AM Pinkey ORN wrote: Reason for CRM: levothyroxine  (SYNTHROID ) 75 MCG tablet

## 2024-04-26 ENCOUNTER — Other Ambulatory Visit: Payer: Self-pay | Admitting: Cardiology

## 2024-04-30 ENCOUNTER — Encounter: Payer: Self-pay | Admitting: Pulmonary Disease

## 2024-04-30 ENCOUNTER — Ambulatory Visit: Admitting: Pulmonary Disease

## 2024-04-30 VITALS — BP 126/74 | HR 71 | Ht 69.0 in | Wt 174.0 lb

## 2024-04-30 DIAGNOSIS — J45998 Other asthma: Secondary | ICD-10-CM

## 2024-04-30 DIAGNOSIS — J849 Interstitial pulmonary disease, unspecified: Secondary | ICD-10-CM

## 2024-04-30 DIAGNOSIS — Z955 Presence of coronary angioplasty implant and graft: Secondary | ICD-10-CM

## 2024-04-30 DIAGNOSIS — I2510 Atherosclerotic heart disease of native coronary artery without angina pectoris: Secondary | ICD-10-CM | POA: Diagnosis not present

## 2024-04-30 DIAGNOSIS — R0602 Shortness of breath: Secondary | ICD-10-CM

## 2024-04-30 DIAGNOSIS — J8410 Pulmonary fibrosis, unspecified: Secondary | ICD-10-CM | POA: Diagnosis not present

## 2024-04-30 DIAGNOSIS — J4541 Moderate persistent asthma with (acute) exacerbation: Secondary | ICD-10-CM

## 2024-04-30 MED ORDER — MONTELUKAST SODIUM 10 MG PO TABS: 10.0000 mg | ORAL_TABLET | Freq: Every day | ORAL | 11 refills | Status: AC

## 2024-04-30 NOTE — Progress Notes (Signed)
 Caleb Butler    [id number]    1946/05/15  Primary Care Physician:Pickard, [name], MD  Referring Physician: [name], MD [address] [place],  [place] [address]  Chief complaint: Follow-up for interstitial lung disease, asthma  HPI: 78 y.o. who  has a past medical history of Anxiety, Aortic aneurysm, Arthritis, CAD (coronary artery disease), Chronic lower back pain, GERD (gastroesophageal reflux disease), Hiatal hernia, High cholesterol, Hypothyroidism, IBS (irritable bowel syndrome), Numbness and tingling of right lower extremity, PMR (polymyalgia rheumatica), Stroke (HCC), and Type 2 diabetes mellitus (HCC).   Discussed the use of AI scribe software for clinical note transcription with the patient, who gave verbal consent to proceed.  The patient, with a history of coronary artery disease and stents, presents with shortness of breath, particularly with exertion.  He had a cardiopulmonary exercise test in 2017 with findings suggestive of pulmonary vascular disease however follow-up right heart cath with no evidence of pulmonary hypertension.  Coronary artery disease is stable  The patient has a history of exposure to asbestos and Agent Orange, but denies significant smoking history. 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 He denies any family history of lung disease.   ED visit in August 2025 for acute shortness of breath with chest x-ray showing low lung volumes.  Negative for COVID flu, RSV.  Stiolto changed to Symbicort  due to concern for asthma given elevated eosinophils and FENO  Interim history: Discussed the use of AI scribe software for clinical note transcription with the patient, who gave verbal consent to proceed.  History of Present Illness Caleb Butler is a 78 year old  male with interstitial lung disease and suspected asthma who presents with shortness of breath.  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, but activity is limited by dyspnea  Wheezing and airway inflammation - Occasional wheezing - Elevated eosinophils and positive FeNO at last visit indicating airway inflammation and was started on Symbicort   Inhaled therapy response and adherence - Previously used Stiolto, currently on Symbicort  (two puffs in the morning and two in the evening) - Uses Symbicort  inconsistently, often only before activity - No meaningful improvement in symptoms compared with prior therapy  Chest pain - Intermittent chest pain, including one nocturnal episode - Evaluated by cardiology and prescribed nitroglycerin  - No recent need for nitroglycerin   Relevant pulmonary history Pets: Lives in a farm with dogs, horses, cats, donkeys and chicken Occupation: Retired personnel officer.   Exposures: He was exposed to asbestos and agent orange while in [place].  No mold, hot tub, Jacuzzi.  No feather pillows or comforters Smoking history: Never smoker Travel history: Originally from [place] .  No significant recent travel Relevant family history: No family history of lung disease  Outpatient Encounter Medications as of 04/30/2024  Medication Sig   Accu-Chek FastClix Lancets MISC USE TO CHECK BLOOD SUGAR TWICE DAILY E11.9   acetaminophen  (TYLENOL ) 500 MG tablet Take 500-1,000 mg by mouth daily as needed for moderate pain or headache.  albuterol  (PROVENTIL ) (5 MG/ML) 0.5% nebulizer solution Take 0.5 mLs (2.5 mg total) by nebulization  every 6 (six) hours as needed for wheezing or shortness of breath.   aspirin  81 MG EC tablet Take 81 mg by mouth at bedtime.    blood glucose meter kit and supplies Dispense based on patient and insurance preference. Use up to four times daily as directed. (FOR ICD-10 E10.9, E11.9).   Blood Glucose Monitoring Suppl DEVI 1 each by Does not apply route in the morning, at noon, and at bedtime. May substitute to any manufacturer covered by patient's insurance. Needs Onetouch by [employer]   budesonide -formoterol  (SYMBICORT ) 160-4.5 MCG/ACT inhaler Inhale 2 puffs into the lungs in the morning and at bedtime.   Capsaicin 0.1 % CREA Apply topically as needed.   clopidogrel  (PLAVIX ) 75 MG tablet TAKE 1 TABLET BY MOUTH EVERY DAY   diclofenac Sodium (VOLTAREN) 1 % GEL Apply 2 g topically daily as needed.   famotidine (PEPCID) 40 MG tablet Take 40 mg by mouth daily. (Patient taking differently: Take 40 mg by mouth daily. Taking every night)   levothyroxine  (SYNTHROID ) 75 MCG tablet TAKE 1 TABLET BY MOUTH EVERY DAY   mometasone  (ELOCON ) 0.1 % ointment Apply topically daily.   Multiple Vitamin (MULTIVITAMIN WITH MINERALS) TABS tablet Take 1 tablet by mouth daily.   nitroGLYCERIN  (NITROSTAT ) 0.4 MG SL tablet Place 1 tablet (0.4 mg total) under the tongue every 5 (five) minutes as needed for chest pain (may use 3 in a 15 minute time frame, if no relief after 3rd dose proceed to ED for evaluation).   rosuvastatin  (CRESTOR ) 20 MG tablet TAKE 1 TABLET BY MOUTH EVERY DAY   Semaglutide ,0.25 or 0.5MG /DOS, (OZEMPIC , 0.25 OR 0.5 MG/DOSE,) 2 MG/1.5ML SOPN Inject 0.5 mg into the skin once a week.   vitamin B-12 (CYANOCOBALAMIN) 1000 MCG tablet Take 1,000 mcg by mouth daily.   DENTA 5000 PLUS 1.1 % CREA dental cream Place 1 Application onto teeth at bedtime. (Patient not taking: Reported on 04/30/2024)   levothyroxine  (SYNTHROID ) 100 MCG tablet Take 50 mcg by mouth daily before breakfast. (Patient not taking: Reported on  04/30/2024)   traZODone  (DESYREL ) 50 MG tablet Take 1 tablet (50 mg total) by mouth at bedtime. (Patient not taking: Reported on 04/30/2024)   Facility-Administered Encounter Medications as of 04/30/2024  Medication   ipratropium-albuterol  (DUONEB) 0.5-2.5 (3) MG/3ML nebulizer solution 3 mL   Vitals:   04/30/24 1428  BP: 126/74  Pulse: 71  Height: 5' 9 (1.753 m)  Weight: 174 lb (78.9 kg)  SpO2: 98%  TempSrc: Oral  BMI (Calculated): 25.68     Physical Exam GEN: No acute distress CV: Regular rate and rhythm no murmurs LUNGS: Clear to auscultation bilaterally normal respiratory effort SKIN JOINTS: Warm and dry no rash    Data Reviewed: Imaging: CT high-resolution 05/12/2023 Mild subpleural coarsening, reticular densities, stable compared to prior. Indeterminate for UIP.    Chest x-ray 01/15/2024-low lung volumes with streaky atelectasis/scarring I reviewed the images personally.  PFTs: 03/21/2023 FVC 2.24 [55%], FEV1 1.80 [61%], F/F80, TLC 4.43 [64%], DLCO 20.43 [84%] Moderate restriction  FENO 01/03/2024-34  Labs: ILD serologies 03/21/2023-negative IgE 03/21/2023 1806  CBC 01/15/2024-WBC 9.4, eos 5%, absolute eosinophil count 470  Cardiac: Cardiopulmonary exercise test 05/06/2016 Conclusion: Exercise testing with gas exchange demonstrates a normal functional capacity when compared to matched sedentary norms.  There is no indication for exercise-induced bronchospasm. There is no clear indication for cardiovascular limitation. Although he has normal exercise capacity,  he may be primarily symptomatic due to ventilatory limitations. The pattern of no breathing reserve, excessive dead space ventilation (Elevated VE/VCO2 slope) may indicate subtle ILD or pulmonary vascular process. If a right and left heart cath is performed we suggest measuring right heart pressures before and with exertion to check for exercise induced pulmonary hypertension. Pre-exercise restrictive patterns and  exceeding of ventilatory limits at peak exercise may also be related to patient's body habitus, which is contributing to dyspnea.   Echocardiogram 02/16/2023-LVEF 55-60%, grade 1 diastolic dysfunction, normal PA systolic pressure  Cardiac cath 03/13/2018-coronary artery disease with patent stent Normal right heart cath numbers with no evidence of pulmonary hypertension Assessment & Plan Asthma with eosinophilic inflammation Asthma with elevated eosinophil levels and FENO indicating eosinophilic inflammation. Symptoms include dyspnea and wheezing, exacerbated by physical activity. Current treatment with Symbicort  inhaler is not being used consistently, leading to suboptimal control of symptoms. Discussed the importance of daily use of Symbicort  to reduce lung inflammation and relax airways. Consideration of Singulair to further reduce inflammation if symptoms persist. Injection therapy discussed as a future option if oral medications are insufficient. - Instructed to use Symbicort  inhaler, two puffs in the morning and two puffs in the evening. - Prescribed Singulair to be taken daily in conjunction with Symbicort . - Educated on rinsing mouth after using Symbicort  to prevent oral side effects. - Will consider injection therapy if symptoms do not improve with current regimen.  Interstitial lung disease with pulmonary fibrosis Interstitial lung disease with pulmonary fibrosis, likely related to past exposure to [employer] and asbestos. CT scan shows scarring and inflammation in the lungs, but no significant progression. Immune profile negative, ruling out autoimmune causes. Symptoms include dyspnea on exertion. Current management focuses on monitoring and symptomatic treatment. Discussed potential future use of antifibrotic medications if condition worsens, but currently prefer to avoid due to side effects. - Continue to monitor lung condition with periodic CT scans. - Encouraged staying active and  weight loss to improve respiratory function. - Will repeat lung function tests in six months to assess progress.  Coronary artery disease, status post stent placement Coronary artery disease with stent placement five years ago. Reports shortness of breath without chest pain during physical activity. Last stress test was in 2021, and ultrasound was done last year. Shortness of breath is not attributed to cardiac causes based on recent evaluations. - Continue regular follow-ups with cardiologist every six months.   Recommendations: Continue Symbicort , encourage compliance Start Singulair Follow-up with PFTs  I personally spent a total of 40 minutes in the care of the patient today including preparing to see the patient, getting/reviewing separately obtained history, performing a medically appropriate exam/evaluation, counseling and educating, independently interpreting results, communicating results, and coordinating care.    Lonna Coder MD [facility] 04/30/2024, 2:41 PM  CC: [name], MD

## 2024-04-30 NOTE — Patient Instructions (Signed)
  VISIT SUMMARY: Today, we discussed your shortness of breath, wheezing, and chest pain. We reviewed your current inhaler use and made adjustments to your treatment plan to better manage your symptoms.  YOUR PLAN: ASTHMA WITH EOSINOPHILIC INFLAMMATION: You have asthma with elevated eosinophil levels and airway inflammation, causing shortness of breath and wheezing. -Use Symbicort  inhaler, two puffs in the morning and two puffs in the evening, every day. -Start taking Singulair daily along with Symbicort . -Rinse your mouth after using Symbicort  to prevent oral side effects. -We may consider injection therapy if your symptoms do not improve with the current treatment.  INTERSTITIAL LUNG DISEASE WITH PULMONARY FIBROSIS: You have lung scarring and inflammation likely due to past exposure to Agent Orange and asbestos, causing shortness of breath during physical activity. -We will continue to monitor your lung condition with periodic CT scans. -Stay active and aim for weight loss to help improve your breathing. -We will repeat lung function tests in six months to check your progress.

## 2024-05-01 ENCOUNTER — Ambulatory Visit: Payer: Self-pay

## 2024-05-01 NOTE — Telephone Encounter (Signed)
 FYI Only or Action Required?: Action required by provider: request for appointment and update on patient condition.  Patient was last seen in primary care on 11/24/2023 by Caleb [name], MD.  Called Nurse Triage reporting Memory Loss.  Symptoms began yesterday.  Interventions attempted: Nothing.  Symptoms are: unchanged.  Triage Disposition: See HCP Within 4 Hours (Or PCP Triage)  Patient/caregiver understands and will follow disposition?: No, refuses disposition  Copied from CRM [id number]. Topic: Clinical - Red Word Triage >> May 01, 2024 11:17 AM [name] wrote: Red Word that prompted transfer to Nurse Triage: Pt has a history of stroke. Pt had an episode in August where he was having trouble breathing and turned purple. Pt's shortness if breathe has gotten worse. He was given medication and inhaler but pt has not been taking medication. Pt's memory loss is nonexistant per wife. Pt did not recall app details that was in October. Reason for Disposition  [1] Acting confused (e.g., disoriented, slurred speech) AND [2] brief (now gone)  Answer Assessment - Initial Assessment Questions Additional info: Spouse called in to scheduled with pcp only. Spouse upset with triage process and just wants to schedule, she did provide the most important information. Spouse let this clinical research associate know she is retire engineer, civil (consulting) and married over 40 years, she knows he just needs a work up by [employer].  Next available appointment with pcp is not until December 15, they refuse all other evaluation options. Requesting work in to [employer] schedule. Please call back today [phone]    1. LEVEL OF CONSCIOUSNESS: How are they (the patient) acting right now? (e.g., alert-oriented, confused, lethargic, stuporous, comatose)     Loss of memory 2. ONSET: When did the confusion start?  (e.g., minutes, hours, days)     Noticed few days ago, apparent yesterday at pulmonology appointment  3. PATTERN: Does this come and go, or has  it been constant since it started?  Is it present now?     intermittent 4. ALCOHOL or DRUGS: Have they been drinking alcohol or taking any drugs?       5. NARCOTIC MEDICINES: Have they been receiving any narcotic medications? (e.g., morphine , Vicodin)      6. CAUSE: What do you think is causing the confusion?      Unsure 7. OTHER SYMPTOMS: Are there any other symptoms? (e.g., difficulty breathing, fever, headache, weakness)     Breathing difficulty-evaluated at pulm yesterday  Protocols used: Confusion - Delirium-A-AH

## 2024-05-06 ENCOUNTER — Ambulatory Visit: Admitting: Family Medicine

## 2024-05-06 ENCOUNTER — Encounter: Payer: Self-pay | Admitting: Family Medicine

## 2024-05-06 VITALS — BP 126/78 | HR 74 | Temp 97.6°F | Ht 69.0 in | Wt 173.8 lb

## 2024-05-06 DIAGNOSIS — R4181 Age-related cognitive decline: Secondary | ICD-10-CM

## 2024-05-06 NOTE — Progress Notes (Signed)
 Subjective:    Patient ID: Caleb Butler, male    DOB: 1946/05/14, 78 y.o.   MRN: [id number] 2021 Patient is a very pleasant 78 year old Caucasian male who presents today with several concerns.  Second, the patient reports worsening memory loss.  He states that he is becoming more easily confused.  He states that he is forgetting the names of people he is known for more than 50 years.  The names will come to him later but he is definitely noticing that he is having a harder time naming people and objects that he should remember.  He denies getting lost driving.  He denies losing money or forgetting to pay bills.  He does forget conversations that he has had recently.  He denies any headaches or seizures or blurry vision.  However he states that he is tripping and falling almost on a daily basis.  He states that he is tripping 1-2 times a day walking around his house.  He will catch himself more easily losing his balance.  He will stagger into doorways.  He denies any weakness in his arms or legs.  There is no hyperreflexia on examination.  He denies any muscle weakness.  He has normal grip strength and normal arm strength bilaterally.  Cerebellar testing is performed today.  He is able to do heel-to-toe walk up and down the hallway but he does sway considerably while performing it however he does not fall.  He is able to perform Romberg testing without losing his balance.  Finger-to-nose testing is performed normally.  There is no evidence of a neurologic deficit on his neurologic exam.  05/06/24 Patient presents today with his wife reporting virtually the same symptoms.  Wife states that he recently forgot that he had seen his pulmonologist.  However the patient argues with the wife that he remembered seeing the pulmonologist.  It seems a lot of their confusion stems from the patient's hearing loss and not understanding her questions.  He refuses to wear his hearing aids.  However with regards to  his memory, it is stable when compared to 4 years ago.  At that time I performed an MRI that was unremarkable.  Patient has not seen any significant decline in his memory compared to 2021.  He denies forgetting conversations.  He denies forgetting how to use items around the home.  He denies forgetting the names of common items.  He does occasionally forget people's names.  His wife states that his memory has remained relatively stable.  We discussed the blood test today for Alzheimer's disease however the patient is not interested in that.  He certainly does not demonstrate any paranoia or delusion or tremors consistent with Lewy body.  He also does not demonstrate impulse control or speech difficulties associated with frontotemporal dementia.  He also complains of losing his balance more easily however he does not fall.  Today finger-to-nose testing is normal.  Romberg testing is normal.  Heel-to-toe testing is normal.  Neurologic exam is completely normal.  Patient's performance is virtually identical to 2021 Past Medical History:  Diagnosis Date   Anxiety    Aortic aneurysm    dilated aortic root on echo 2024   Arthritis    my back is loaded w/it (01/06/2017)   CAD (coronary artery disease)    80% ostial lesion in L circumflex (2017)   Chronic lower back pain    GERD (gastroesophageal reflux disease)    Hiatal hernia    High  cholesterol    Hypothyroidism    IBS (irritable bowel syndrome)    Numbness and tingling of right lower extremity    since 11/30/2016 (01/06/2017)   PMR (polymyalgia rheumatica)    Stroke (HCC)    Type 2 diabetes mellitus (HCC)    Past Surgical History:  Procedure Laterality Date   CARDIAC CATHETERIZATION  06/2016   CORONARY ANGIOPLASTY WITH STENT PLACEMENT  01/06/2017   CORONARY STENT INTERVENTION N/A 01/06/2017   Procedure: CORONARY STENT INTERVENTION;  Surgeon: Wonda Sharper, MD;  Location[facility] INVASIVE CV LAB;  Service: Cardiovascular;  Laterality: N/A;    FINGER SURGERY Left 1984   ring finger reattached.    FOREARM FRACTURE SURGERY Right 2004   has 3 rods and 22 screws in it   INGUINAL HERNIA REPAIR Left    RIGHT/LEFT HEART CATH AND CORONARY ANGIOGRAPHY N/A 07/07/2016   Procedure: Right/Left Heart Cath and Coronary Angiography;  Surgeon: Toribio JONELLE Fuel, MD;  Location: [facility] INVASIVE CV LAB;  Service: Cardiovascular;  Laterality: N/A;   RIGHT/LEFT HEART CATH AND CORONARY ANGIOGRAPHY N/A 03/13/2018   Procedure: RIGHT/LEFT HEART CATH AND CORONARY ANGIOGRAPHY;  Surgeon: Fuel Toribio JONELLE, MD;  Location: [facility] INVASIVE CV LAB;  Service: Cardiovascular;  Laterality: N/A;   Current Outpatient Medications on File Prior to Visit  Medication Sig Dispense Refill   Accu-Chek FastClix Lancets MISC USE TO CHECK BLOOD SUGAR TWICE DAILY E11.9 102 each 12   acetaminophen  (TYLENOL ) 500 MG tablet Take 500-1,000 mg by mouth daily as needed for moderate pain or headache.     albuterol  (PROVENTIL ) (5 MG/ML) 0.5% nebulizer solution Take 0.5 mLs (2.5 mg total) by nebulization every 6 (six) hours as needed for wheezing or shortness of breath. 20 mL 12   aspirin  81 MG EC tablet Take 81 mg by mouth at bedtime.      blood glucose meter kit and supplies Dispense based on patient and insurance preference. Use up to four times daily as directed. (FOR ICD-10 E10.9, E11.9). 1 each 0   Blood Glucose Monitoring Suppl DEVI 1 each by Does not apply route in the morning, at noon, and at bedtime. May substitute to any manufacturer covered by patient's insurance. Needs Onetouch by [employer] 1 each 0   budesonide -formoterol  (SYMBICORT ) 160-4.5 MCG/ACT inhaler Inhale 2 puffs into the lungs in the morning and at bedtime. 1 each 11   Capsaicin 0.1 % CREA Apply topically as needed.     clopidogrel  (PLAVIX ) 75 MG tablet TAKE 1 TABLET BY MOUTH EVERY DAY 90 tablet 0   diclofenac Sodium (VOLTAREN) 1 % GEL Apply 2 g topically daily as needed.     levothyroxine  (SYNTHROID ) 75 MCG tablet TAKE 1  TABLET BY MOUTH EVERY DAY 90 tablet 3   mometasone  (ELOCON ) 0.1 % ointment Apply topically daily. 45 g 0   montelukast  (SINGULAIR ) 10 MG tablet Take 1 tablet (10 mg total) by mouth at bedtime. 30 tablet 11   Multiple Vitamin (MULTIVITAMIN WITH MINERALS) TABS tablet Take 1 tablet by mouth daily.     nitroGLYCERIN  (NITROSTAT ) 0.4 MG SL tablet Place 1 tablet (0.4 mg total) under the tongue every 5 (five) minutes as needed for chest pain (may use 3 in a 15 minute time frame, if no relief after 3rd dose proceed to ED for evaluation). 25 tablet 3   rosuvastatin  (CRESTOR ) 20 MG tablet TAKE 1 TABLET BY MOUTH EVERY DAY 90 tablet 3   Semaglutide ,0.25 or 0.5MG /DOS, (OZEMPIC , 0.25 OR 0.5 MG/DOSE,) 2 MG/1.5ML SOPN Inject 0.5  mg into the skin once a week. 1.5 mL 3   vitamin B-12 (CYANOCOBALAMIN) 1000 MCG tablet Take 1,000 mcg by mouth daily.     DENTA 5000 PLUS 1.1 % CREA dental cream Place 1 Application onto teeth at bedtime. (Patient not taking: Reported on 05/06/2024)     famotidine (PEPCID) 40 MG tablet Take 40 mg by mouth daily. (Patient not taking: Reported on 05/06/2024)     levothyroxine  (SYNTHROID ) 100 MCG tablet Take 50 mcg by mouth daily before breakfast. (Patient not taking: Reported on 05/06/2024)     traZODone  (DESYREL ) 50 MG tablet Take 1 tablet (50 mg total) by mouth at bedtime. (Patient not taking: Reported on 05/06/2024) 90 tablet 3   Current Facility-Administered Medications on File Prior to Visit  Medication Dose Route Frequency Provider Last Rate Last Admin   ipratropium-albuterol  (DUONEB) 0.5-2.5 (3) MG/3ML nebulizer solution 3 mL  3 mL Nebulization Q4H Mannam, Praveen, MD       Allergies  Allergen Reactions   Bee Venom Swelling   Ceclor [Cefaclor] Hives   Ciprofloxacin Hives   Jardiance  [Empagliflozin ]    Metformin  And Related Diarrhea    upsesomach   Social History   Socioeconomic History   Marital status: Married    Spouse name: Barnie   Number of children: 0   Years of  education: 12   Highest education level: Not on file  Occupational History    Comment: retired  Tobacco Use   Smoking status: Never   Smokeless tobacco: Never  Vaping Use   Vaping status: Never Used  Substance and Sexual Activity   Alcohol use: Not Currently    Alcohol/week: 2.0 standard drinks of alcohol    Types: 1 Glasses of wine, 1 Cans of beer per week    Comment: 1 beer/week   Drug use: No   Sexual activity: Not Currently  Other Topics Concern   Not on file  Social History Narrative   Retired. Lives with wife, Barnie.    Caffeine- coffee, 1 daily   Social Drivers of Corporate Investment Banker Strain: Low Risk  (03/21/2024)   Overall Financial Resource Strain (CARDIA)    Difficulty of Paying Living Expenses: Not hard at all  Food Insecurity: No Food Insecurity (03/21/2024)   Hunger Vital Sign    Worried About Running Out of Food in the Last Year: Never true    Ran Out of Food in the Last Year: Never true  Transportation Needs: No Transportation Needs (03/21/2024)   PRAPARE - Administrator, Civil Service (Medical): No    Lack of Transportation (Non-Medical): No  Physical Activity: Sufficiently Active (03/21/2024)   Exercise Vital Sign    Days of Exercise per Week: 5 days    Minutes of Exercise per Session: 50 min  Stress: No Stress Concern Present (03/21/2024)   [employer] of Occupational Health - Occupational Stress Questionnaire    Feeling of Stress: Not at all  Social Connections: Moderately Integrated (03/21/2024)   Social Connection and Isolation Panel    Frequency of Communication with Friends and Family: More than three times a week    Frequency of Social Gatherings with Friends and Family: More than three times a week    Attends Religious Services: Never    Database Administrator or Organizations: Yes    Attends Banker Meetings: More than 4 times per year    Marital Status: Married  Catering Manager Violence: Not At Risk  (03/21/2024)  Humiliation, Afraid, Rape, and Kick questionnaire    Fear of Current or Ex-Partner: No    Emotionally Abused: No    Physically Abused: No    Sexually Abused: No   Family History  Problem Relation Age of Onset   Diabetes Mellitus II Father    Heart failure Father    Kidney failure Father    Heart attack Mother       Review of Systems  All other systems reviewed and are negative.      Objective:   Physical Exam Vitals reviewed.  Constitutional:      General: He is not in acute distress.    Appearance: He is well-developed. He is not diaphoretic.  HENT:     Head: Normocephalic and atraumatic.     Right Ear: External ear normal.     Left Ear: External ear normal.     Nose: Nose normal.     Mouth/Throat:     Pharynx: No oropharyngeal exudate.  Eyes:     General: No visual field deficit or scleral icterus.       Right eye: No discharge.        Left eye: No discharge.     Conjunctiva/sclera: Conjunctivae normal.     Pupils: Pupils are equal, round, and reactive to light.  Neck:     Thyroid : No thyromegaly.     Vascular: No JVD.     Trachea: No tracheal deviation.  Cardiovascular:     Rate and Rhythm: Normal rate and regular rhythm.     Heart sounds: Normal heart sounds. No murmur heard.    No friction rub. No gallop.  Pulmonary:     Effort: Pulmonary effort is normal. No respiratory distress.     Breath sounds: Normal breath sounds. No stridor. No wheezing or rales.  Chest:     Chest wall: No tenderness.  Abdominal:     General: Bowel sounds are normal. There is no distension.     Palpations: Abdomen is soft. There is no mass.     Tenderness: There is no abdominal tenderness. There is no guarding or rebound.  Musculoskeletal:        General: No tenderness.     Cervical back: Normal range of motion and neck supple.  Lymphadenopathy:     Cervical: No cervical adenopathy.  Skin:    General: Skin is warm.     Coloration: Skin is not pale.      Findings: No erythema or rash.  Neurological:     General: No focal deficit present.     Mental Status: He is alert and oriented to person, place, and time.     Cranial Nerves: No cranial nerve deficit, dysarthria or facial asymmetry.     Sensory: Sensation is intact. No sensory deficit.     Motor: Motor function is intact. No weakness, tremor, atrophy, abnormal muscle tone, seizure activity or pronator drift.     Coordination: Coordination is intact. Romberg sign negative. Coordination normal. Finger-Nose-Finger Test and Heel to [facility] Test normal.     Gait: Gait is intact. Gait and tandem walk normal.     Deep Tendon Reflexes: Reflexes are normal and symmetric.           Assessment & Plan:  Age-related cognitive decline Patient has some mild age-related memory issues and also balance issues but I see no evidence of a progressive neurocognitive disorder such as Alzheimer's disease.  If anything his memory loss may stem from previous concussions or  even a vascular component.  We discussed the lab work for Alzheimer's disease and he declined this today.  Neurologic exam is normal so I do not see any evidence of any neuromuscular disorder that would cause him to trip or fall.  I tried to reassure the patient that his exam today is normal

## 2024-06-26 ENCOUNTER — Other Ambulatory Visit: Payer: Self-pay | Admitting: Family Medicine

## 2024-06-26 DIAGNOSIS — I25119 Atherosclerotic heart disease of native coronary artery with unspecified angina pectoris: Secondary | ICD-10-CM

## 2025-03-27 ENCOUNTER — Ambulatory Visit
